# Patient Record
Sex: Female | Born: 1970
Health system: Southern US, Community
[De-identification: ages and names within clinical notes are randomized; demographics above are authoritative.]

## PROBLEM LIST (undated history)

## (undated) ENCOUNTER — Ambulatory Visit: Admission: EM | Payer: Managed Care, Other (non HMO) | Source: Home / Self Care

## (undated) DIAGNOSIS — D649 Anemia, unspecified: Secondary | ICD-10-CM

## (undated) DIAGNOSIS — J45909 Unspecified asthma, uncomplicated: Secondary | ICD-10-CM

## (undated) DIAGNOSIS — R87619 Unspecified abnormal cytological findings in specimens from cervix uteri: Secondary | ICD-10-CM

## (undated) DIAGNOSIS — K589 Irritable bowel syndrome without diarrhea: Secondary | ICD-10-CM

## (undated) DIAGNOSIS — N938 Other specified abnormal uterine and vaginal bleeding: Secondary | ICD-10-CM

## (undated) DIAGNOSIS — D219 Benign neoplasm of connective and other soft tissue, unspecified: Secondary | ICD-10-CM

## (undated) DIAGNOSIS — F319 Bipolar disorder, unspecified: Secondary | ICD-10-CM

## (undated) DIAGNOSIS — R51 Headache: Secondary | ICD-10-CM

## (undated) DIAGNOSIS — N6009 Solitary cyst of unspecified breast: Secondary | ICD-10-CM

## (undated) DIAGNOSIS — E079 Disorder of thyroid, unspecified: Secondary | ICD-10-CM

## (undated) DIAGNOSIS — IMO0002 Reserved for concepts with insufficient information to code with codable children: Secondary | ICD-10-CM

## (undated) DIAGNOSIS — E039 Hypothyroidism, unspecified: Secondary | ICD-10-CM

## (undated) DIAGNOSIS — C4491 Basal cell carcinoma of skin, unspecified: Secondary | ICD-10-CM

## (undated) HISTORY — DX: Unspecified abnormal cytological findings in specimens from cervix uteri: R87.619

## (undated) HISTORY — PX: DILATION AND CURETTAGE OF UTERUS: SHX78

## (undated) HISTORY — DX: Reserved for concepts with insufficient information to code with codable children: IMO0002

## (undated) HISTORY — DX: Solitary cyst of unspecified breast: N60.09

## (undated) HISTORY — PX: TUBAL LIGATION: SHX77

## (undated) HISTORY — PX: CERVICAL BIOPSY  W/ LOOP ELECTRODE EXCISION: SUR135

## (undated) HISTORY — DX: Basal cell carcinoma of skin, unspecified: C44.91

## (undated) HISTORY — DX: Benign neoplasm of connective and other soft tissue, unspecified: D21.9

## (undated) HISTORY — PX: COLONOSCOPY: SHX174

## (undated) HISTORY — PX: CHOLECYSTECTOMY: SHX55

## (undated) HISTORY — PX: COLON SURGERY: SHX602

## (undated) HISTORY — PX: UPPER GASTROINTESTINAL ENDOSCOPY: SHX188

## (undated) HISTORY — DX: Other specified abnormal uterine and vaginal bleeding: N93.8

---

## 2001-06-03 ENCOUNTER — Emergency Department (HOSPITAL_COMMUNITY): Admission: EM | Admit: 2001-06-03 | Discharge: 2001-06-03 | Payer: Self-pay | Admitting: Emergency Medicine

## 2001-06-25 ENCOUNTER — Emergency Department (HOSPITAL_COMMUNITY): Admission: EM | Admit: 2001-06-25 | Discharge: 2001-06-25 | Payer: Self-pay | Admitting: Emergency Medicine

## 2001-06-25 ENCOUNTER — Encounter: Payer: Self-pay | Admitting: Emergency Medicine

## 2001-11-08 ENCOUNTER — Emergency Department (HOSPITAL_COMMUNITY): Admission: EM | Admit: 2001-11-08 | Discharge: 2001-11-08 | Payer: Self-pay | Admitting: Emergency Medicine

## 2002-01-25 ENCOUNTER — Emergency Department (HOSPITAL_COMMUNITY): Admission: EM | Admit: 2002-01-25 | Discharge: 2002-01-25 | Payer: Self-pay | Admitting: Emergency Medicine

## 2002-04-28 ENCOUNTER — Emergency Department (HOSPITAL_COMMUNITY): Admission: EM | Admit: 2002-04-28 | Discharge: 2002-04-28 | Payer: Self-pay | Admitting: Emergency Medicine

## 2002-05-01 ENCOUNTER — Emergency Department (HOSPITAL_COMMUNITY): Admission: EM | Admit: 2002-05-01 | Discharge: 2002-05-01 | Payer: Self-pay | Admitting: Emergency Medicine

## 2002-09-12 ENCOUNTER — Encounter: Payer: Self-pay | Admitting: Emergency Medicine

## 2002-09-12 ENCOUNTER — Emergency Department (HOSPITAL_COMMUNITY): Admission: EM | Admit: 2002-09-12 | Discharge: 2002-09-12 | Payer: Self-pay | Admitting: Emergency Medicine

## 2002-09-25 ENCOUNTER — Encounter: Admission: RE | Admit: 2002-09-25 | Discharge: 2002-09-25 | Payer: Self-pay | Admitting: Internal Medicine

## 2002-12-31 ENCOUNTER — Encounter: Admission: RE | Admit: 2002-12-31 | Discharge: 2002-12-31 | Payer: Self-pay | Admitting: Internal Medicine

## 2003-02-09 ENCOUNTER — Encounter: Admission: RE | Admit: 2003-02-09 | Discharge: 2003-02-09 | Payer: Self-pay | Admitting: Internal Medicine

## 2003-02-17 ENCOUNTER — Encounter: Admission: RE | Admit: 2003-02-17 | Discharge: 2003-02-17 | Payer: Self-pay | Admitting: Internal Medicine

## 2003-03-25 ENCOUNTER — Encounter: Admission: RE | Admit: 2003-03-25 | Discharge: 2003-03-25 | Payer: Self-pay | Admitting: Internal Medicine

## 2003-03-30 ENCOUNTER — Encounter: Admission: RE | Admit: 2003-03-30 | Discharge: 2003-03-30 | Payer: Self-pay | Admitting: Obstetrics and Gynecology

## 2003-04-06 ENCOUNTER — Ambulatory Visit (HOSPITAL_COMMUNITY): Admission: RE | Admit: 2003-04-06 | Discharge: 2003-04-06 | Payer: Self-pay | Admitting: Obstetrics and Gynecology

## 2003-04-20 ENCOUNTER — Encounter: Admission: RE | Admit: 2003-04-20 | Discharge: 2003-04-20 | Payer: Self-pay | Admitting: Obstetrics and Gynecology

## 2003-05-21 ENCOUNTER — Encounter: Admission: RE | Admit: 2003-05-21 | Discharge: 2003-05-21 | Payer: Self-pay | Admitting: Family Medicine

## 2003-09-03 ENCOUNTER — Emergency Department (HOSPITAL_COMMUNITY): Admission: EM | Admit: 2003-09-03 | Discharge: 2003-09-03 | Payer: Self-pay | Admitting: Family Medicine

## 2003-09-30 ENCOUNTER — Inpatient Hospital Stay (HOSPITAL_COMMUNITY): Admission: AD | Admit: 2003-09-30 | Discharge: 2003-09-30 | Payer: Self-pay | Admitting: Obstetrics & Gynecology

## 2003-10-08 ENCOUNTER — Encounter: Admission: RE | Admit: 2003-10-08 | Discharge: 2003-10-08 | Payer: Self-pay | Admitting: Family Medicine

## 2003-12-02 ENCOUNTER — Encounter: Admission: RE | Admit: 2003-12-02 | Discharge: 2003-12-02 | Payer: Self-pay | Admitting: Family Medicine

## 2004-02-03 ENCOUNTER — Ambulatory Visit: Payer: Self-pay | Admitting: Family Medicine

## 2004-02-04 ENCOUNTER — Ambulatory Visit (HOSPITAL_COMMUNITY): Admission: RE | Admit: 2004-02-04 | Discharge: 2004-02-04 | Payer: Self-pay | Admitting: *Deleted

## 2004-04-06 ENCOUNTER — Ambulatory Visit: Payer: Self-pay | Admitting: Family Medicine

## 2004-04-09 ENCOUNTER — Emergency Department (HOSPITAL_COMMUNITY): Admission: EM | Admit: 2004-04-09 | Discharge: 2004-04-09 | Payer: Self-pay | Admitting: Emergency Medicine

## 2004-07-06 ENCOUNTER — Emergency Department (HOSPITAL_COMMUNITY): Admission: EM | Admit: 2004-07-06 | Discharge: 2004-07-06 | Payer: Self-pay | Admitting: Family Medicine

## 2004-07-11 ENCOUNTER — Ambulatory Visit: Payer: Self-pay | Admitting: Internal Medicine

## 2004-07-20 ENCOUNTER — Ambulatory Visit: Payer: Self-pay | Admitting: Internal Medicine

## 2004-10-19 ENCOUNTER — Other Ambulatory Visit: Admission: RE | Admit: 2004-10-19 | Discharge: 2004-10-19 | Payer: Self-pay | Admitting: Family Medicine

## 2004-10-19 ENCOUNTER — Ambulatory Visit: Payer: Self-pay | Admitting: Family Medicine

## 2004-11-09 ENCOUNTER — Ambulatory Visit: Payer: Self-pay | Admitting: Family Medicine

## 2004-12-04 ENCOUNTER — Ambulatory Visit: Payer: Self-pay | Admitting: Family Medicine

## 2004-12-04 ENCOUNTER — Ambulatory Visit (HOSPITAL_COMMUNITY): Admission: RE | Admit: 2004-12-04 | Discharge: 2004-12-04 | Payer: Self-pay | Admitting: Family Medicine

## 2004-12-04 ENCOUNTER — Encounter (INDEPENDENT_AMBULATORY_CARE_PROVIDER_SITE_OTHER): Payer: Self-pay | Admitting: *Deleted

## 2004-12-22 ENCOUNTER — Ambulatory Visit: Payer: Self-pay | Admitting: Family Medicine

## 2005-01-10 ENCOUNTER — Inpatient Hospital Stay (HOSPITAL_COMMUNITY): Admission: AD | Admit: 2005-01-10 | Discharge: 2005-01-10 | Payer: Self-pay | Admitting: Obstetrics and Gynecology

## 2005-01-23 ENCOUNTER — Ambulatory Visit: Payer: Self-pay | Admitting: Obstetrics and Gynecology

## 2005-03-22 ENCOUNTER — Emergency Department (HOSPITAL_COMMUNITY): Admission: EM | Admit: 2005-03-22 | Discharge: 2005-03-22 | Payer: Self-pay | Admitting: Family Medicine

## 2005-06-24 ENCOUNTER — Emergency Department (HOSPITAL_COMMUNITY): Admission: EM | Admit: 2005-06-24 | Discharge: 2005-06-24 | Payer: Self-pay | Admitting: Family Medicine

## 2005-08-04 ENCOUNTER — Emergency Department (HOSPITAL_COMMUNITY): Admission: EM | Admit: 2005-08-04 | Discharge: 2005-08-04 | Payer: Self-pay | Admitting: Family Medicine

## 2005-11-16 ENCOUNTER — Emergency Department (HOSPITAL_COMMUNITY): Admission: EM | Admit: 2005-11-16 | Discharge: 2005-11-16 | Payer: Self-pay | Admitting: Family Medicine

## 2005-11-28 ENCOUNTER — Ambulatory Visit: Payer: Self-pay | Admitting: Obstetrics and Gynecology

## 2005-11-28 ENCOUNTER — Other Ambulatory Visit: Admission: RE | Admit: 2005-11-28 | Discharge: 2005-11-28 | Payer: Self-pay | Admitting: Obstetrics & Gynecology

## 2005-11-28 ENCOUNTER — Encounter: Payer: Self-pay | Admitting: Obstetrics & Gynecology

## 2005-11-30 ENCOUNTER — Ambulatory Visit: Payer: Self-pay | Admitting: Family Medicine

## 2005-12-09 ENCOUNTER — Emergency Department (HOSPITAL_COMMUNITY): Admission: EM | Admit: 2005-12-09 | Discharge: 2005-12-09 | Payer: Self-pay | Admitting: Emergency Medicine

## 2006-01-22 ENCOUNTER — Ambulatory Visit (HOSPITAL_COMMUNITY): Admission: RE | Admit: 2006-01-22 | Discharge: 2006-01-22 | Payer: Self-pay | Admitting: Obstetrics and Gynecology

## 2006-01-22 ENCOUNTER — Ambulatory Visit: Payer: Self-pay | Admitting: Obstetrics and Gynecology

## 2006-02-07 ENCOUNTER — Ambulatory Visit: Payer: Self-pay | Admitting: Obstetrics and Gynecology

## 2006-03-19 ENCOUNTER — Inpatient Hospital Stay (HOSPITAL_COMMUNITY): Admission: AD | Admit: 2006-03-19 | Discharge: 2006-03-19 | Payer: Self-pay | Admitting: Obstetrics and Gynecology

## 2006-03-23 ENCOUNTER — Emergency Department (HOSPITAL_COMMUNITY): Admission: EM | Admit: 2006-03-23 | Discharge: 2006-03-23 | Payer: Self-pay | Admitting: Emergency Medicine

## 2006-04-11 ENCOUNTER — Emergency Department (HOSPITAL_COMMUNITY): Admission: EM | Admit: 2006-04-11 | Discharge: 2006-04-11 | Payer: Self-pay | Admitting: Family Medicine

## 2006-05-06 ENCOUNTER — Emergency Department (HOSPITAL_COMMUNITY): Admission: EM | Admit: 2006-05-06 | Discharge: 2006-05-06 | Payer: Self-pay | Admitting: Family Medicine

## 2006-05-09 ENCOUNTER — Emergency Department (HOSPITAL_COMMUNITY): Admission: EM | Admit: 2006-05-09 | Discharge: 2006-05-09 | Payer: Self-pay | Admitting: Family Medicine

## 2006-07-14 ENCOUNTER — Emergency Department (HOSPITAL_COMMUNITY): Admission: EM | Admit: 2006-07-14 | Discharge: 2006-07-14 | Payer: Self-pay | Admitting: Emergency Medicine

## 2006-07-30 ENCOUNTER — Ambulatory Visit: Payer: Self-pay | Admitting: Nurse Practitioner

## 2006-08-08 ENCOUNTER — Encounter (INDEPENDENT_AMBULATORY_CARE_PROVIDER_SITE_OTHER): Payer: Self-pay | Admitting: Nurse Practitioner

## 2006-08-08 ENCOUNTER — Ambulatory Visit: Payer: Self-pay | Admitting: Nurse Practitioner

## 2006-08-08 ENCOUNTER — Other Ambulatory Visit: Admission: RE | Admit: 2006-08-08 | Discharge: 2006-08-08 | Payer: Self-pay | Admitting: Family Medicine

## 2007-02-08 ENCOUNTER — Emergency Department (HOSPITAL_COMMUNITY): Admission: EM | Admit: 2007-02-08 | Discharge: 2007-02-08 | Payer: Self-pay | Admitting: Emergency Medicine

## 2007-05-12 ENCOUNTER — Emergency Department (HOSPITAL_COMMUNITY): Admission: EM | Admit: 2007-05-12 | Discharge: 2007-05-12 | Payer: Self-pay | Admitting: Family Medicine

## 2007-05-15 ENCOUNTER — Ambulatory Visit: Payer: Self-pay | Admitting: Family Medicine

## 2007-07-23 ENCOUNTER — Ambulatory Visit: Payer: Self-pay | Admitting: Family Medicine

## 2007-08-07 ENCOUNTER — Ambulatory Visit: Payer: Self-pay | Admitting: Internal Medicine

## 2007-09-01 ENCOUNTER — Ambulatory Visit: Payer: Self-pay | Admitting: Internal Medicine

## 2007-09-01 ENCOUNTER — Encounter (INDEPENDENT_AMBULATORY_CARE_PROVIDER_SITE_OTHER): Payer: Self-pay | Admitting: Nurse Practitioner

## 2007-09-01 LAB — CONVERTED CEMR LAB: TSH: 65.593 microintl units/mL — ABNORMAL HIGH (ref 0.350–5.50)

## 2007-10-08 ENCOUNTER — Ambulatory Visit: Payer: Self-pay | Admitting: Internal Medicine

## 2007-10-09 ENCOUNTER — Encounter (INDEPENDENT_AMBULATORY_CARE_PROVIDER_SITE_OTHER): Payer: Self-pay | Admitting: Nurse Practitioner

## 2007-10-09 LAB — CONVERTED CEMR LAB
Lithium Lvl: 0.54 meq/L — ABNORMAL LOW (ref 0.80–1.40)
TSH: 19.986 microintl units/mL — ABNORMAL HIGH (ref 0.350–5.50)

## 2007-11-12 ENCOUNTER — Ambulatory Visit: Payer: Self-pay | Admitting: Internal Medicine

## 2007-11-12 LAB — CONVERTED CEMR LAB: TSH: 1.684 microintl units/mL (ref 0.350–5.50)

## 2008-01-08 ENCOUNTER — Encounter: Payer: Self-pay | Admitting: Internal Medicine

## 2008-01-08 ENCOUNTER — Ambulatory Visit: Payer: Self-pay | Admitting: Internal Medicine

## 2008-01-08 LAB — CONVERTED CEMR LAB
BUN: 11 mg/dL (ref 6–23)
CO2: 21 meq/L (ref 19–32)
Calcium: 9 mg/dL (ref 8.4–10.5)
Chloride: 104 meq/L (ref 96–112)
Creatinine, Ser: 0.8 mg/dL (ref 0.40–1.20)
Glucose, Bld: 93 mg/dL (ref 70–99)
Lithium Lvl: 0.45 meq/L — ABNORMAL LOW (ref 0.80–1.40)
Potassium: 4 meq/L (ref 3.5–5.3)
Sodium: 138 meq/L (ref 135–145)
TSH: 0.371 microintl units/mL (ref 0.350–4.50)

## 2008-04-13 ENCOUNTER — Ambulatory Visit: Payer: Self-pay | Admitting: Internal Medicine

## 2008-05-11 ENCOUNTER — Ambulatory Visit (HOSPITAL_COMMUNITY): Admission: RE | Admit: 2008-05-11 | Discharge: 2008-05-11 | Payer: Self-pay | Admitting: Family Medicine

## 2008-06-01 ENCOUNTER — Encounter: Admission: RE | Admit: 2008-06-01 | Discharge: 2008-06-01 | Payer: Self-pay | Admitting: Family Medicine

## 2008-10-04 ENCOUNTER — Ambulatory Visit: Payer: Self-pay | Admitting: Family Medicine

## 2008-10-04 ENCOUNTER — Encounter: Payer: Self-pay | Admitting: Cardiovascular Disease

## 2008-10-04 ENCOUNTER — Encounter (INDEPENDENT_AMBULATORY_CARE_PROVIDER_SITE_OTHER): Payer: Self-pay | Admitting: Internal Medicine

## 2008-10-04 LAB — CONVERTED CEMR LAB
ALT: 16 units/L (ref 0–35)
AST: 14 units/L (ref 0–37)
Albumin: 4.4 g/dL (ref 3.5–5.2)
Alkaline Phosphatase: 68 units/L (ref 39–117)
BUN: 11 mg/dL (ref 6–23)
CO2: 24 meq/L (ref 19–32)
Calcium: 9.5 mg/dL (ref 8.4–10.5)
Chloride: 105 meq/L (ref 96–112)
Creatinine, Ser: 0.79 mg/dL (ref 0.40–1.20)
Glucose, Bld: 94 mg/dL (ref 70–99)
Lithium Lvl: 0.47 meq/L — ABNORMAL LOW (ref 0.80–1.40)
Potassium: 4.2 meq/L (ref 3.5–5.3)
Sodium: 138 meq/L (ref 135–145)
TSH: 1.765 microintl units/mL (ref 0.350–4.500)
Total Bilirubin: 0.3 mg/dL (ref 0.3–1.2)
Total Protein: 7.1 g/dL (ref 6.0–8.3)

## 2008-10-19 ENCOUNTER — Emergency Department (HOSPITAL_COMMUNITY): Admission: EM | Admit: 2008-10-19 | Discharge: 2008-10-19 | Payer: Self-pay | Admitting: Emergency Medicine

## 2008-11-05 ENCOUNTER — Ambulatory Visit: Payer: Self-pay | Admitting: Cardiovascular Disease

## 2008-11-05 DIAGNOSIS — R079 Chest pain, unspecified: Secondary | ICD-10-CM

## 2008-11-05 DIAGNOSIS — R9431 Abnormal electrocardiogram [ECG] [EKG]: Secondary | ICD-10-CM | POA: Insufficient documentation

## 2008-11-05 HISTORY — DX: Chest pain, unspecified: R07.9

## 2008-11-08 ENCOUNTER — Telehealth (INDEPENDENT_AMBULATORY_CARE_PROVIDER_SITE_OTHER): Payer: Self-pay | Admitting: *Deleted

## 2008-11-24 ENCOUNTER — Encounter: Payer: Self-pay | Admitting: Cardiovascular Disease

## 2008-11-24 ENCOUNTER — Ambulatory Visit: Payer: Self-pay

## 2008-11-26 ENCOUNTER — Telehealth (INDEPENDENT_AMBULATORY_CARE_PROVIDER_SITE_OTHER): Payer: Self-pay | Admitting: *Deleted

## 2008-11-29 ENCOUNTER — Encounter (INDEPENDENT_AMBULATORY_CARE_PROVIDER_SITE_OTHER): Payer: Self-pay | Admitting: Internal Medicine

## 2008-11-29 ENCOUNTER — Ambulatory Visit: Payer: Self-pay | Admitting: Internal Medicine

## 2008-11-29 LAB — CONVERTED CEMR LAB
Chlamydia, DNA Probe: NEGATIVE
GC Probe Amp, Genital: NEGATIVE

## 2009-01-15 ENCOUNTER — Emergency Department (HOSPITAL_COMMUNITY): Admission: EM | Admit: 2009-01-15 | Discharge: 2009-01-15 | Payer: Self-pay | Admitting: Emergency Medicine

## 2009-04-11 ENCOUNTER — Ambulatory Visit: Payer: Self-pay | Admitting: Family Medicine

## 2009-07-06 ENCOUNTER — Ambulatory Visit: Payer: Self-pay | Admitting: Internal Medicine

## 2009-08-29 ENCOUNTER — Ambulatory Visit: Payer: Self-pay | Admitting: Internal Medicine

## 2009-08-29 ENCOUNTER — Encounter (INDEPENDENT_AMBULATORY_CARE_PROVIDER_SITE_OTHER): Payer: Self-pay | Admitting: Family Medicine

## 2009-08-29 LAB — CONVERTED CEMR LAB
BUN: 17 mg/dL (ref 6–23)
CO2: 19 meq/L (ref 19–32)
Calcium: 9.7 mg/dL (ref 8.4–10.5)
Chloride: 107 meq/L (ref 96–112)
Creatinine, Ser: 0.74 mg/dL (ref 0.40–1.20)
Glucose, Bld: 90 mg/dL (ref 70–99)
Lithium Lvl: 0.42 meq/L — ABNORMAL LOW (ref 0.80–1.40)
Potassium: 3.8 meq/L (ref 3.5–5.3)
Sodium: 141 meq/L (ref 135–145)
TSH: 0.32 microintl units/mL — ABNORMAL LOW (ref 0.350–4.500)

## 2009-10-30 ENCOUNTER — Emergency Department (HOSPITAL_COMMUNITY): Admission: EM | Admit: 2009-10-30 | Discharge: 2009-10-30 | Payer: Self-pay | Admitting: Emergency Medicine

## 2009-11-07 ENCOUNTER — Ambulatory Visit: Payer: Self-pay | Admitting: Internal Medicine

## 2009-12-28 ENCOUNTER — Ambulatory Visit: Payer: Self-pay | Admitting: Internal Medicine

## 2009-12-28 ENCOUNTER — Encounter (INDEPENDENT_AMBULATORY_CARE_PROVIDER_SITE_OTHER): Payer: Self-pay | Admitting: Family Medicine

## 2009-12-28 LAB — CONVERTED CEMR LAB
Basophils Absolute: 0 10*3/uL (ref 0.0–0.1)
Basophils Relative: 0 % (ref 0–1)
Cholesterol: 186 mg/dL (ref 0–200)
Eosinophils Absolute: 0.2 10*3/uL (ref 0.0–0.7)
Eosinophils Relative: 3 % (ref 0–5)
HCT: 45.1 % (ref 36.0–46.0)
HDL: 36 mg/dL — ABNORMAL LOW (ref >39)
Hemoglobin: 13.7 g/dL (ref 12.0–15.0)
Lithium Lvl: 0.57 meq/L — ABNORMAL LOW (ref 0.80–1.40)
Lymphocytes Relative: 24 % (ref 12–46)
Lymphs Abs: 1.5 10*3/uL (ref 0.7–4.0)
MCHC: 30.4 g/dL (ref 30.0–36.0)
MCV: 98.9 fL (ref 78.0–100.0)
Monocytes Absolute: 0.3 10*3/uL (ref 0.1–1.0)
Monocytes Relative: 6 % (ref 3–12)
Neutro Abs: 4.1 10*3/uL (ref 1.7–7.7)
Neutrophils Relative %: 68 % (ref 43–77)
Platelets: 210 10*3/uL (ref 150–400)
RBC: 4.56 M/uL (ref 3.87–5.11)
RDW: 14.2 % (ref 11.5–15.5)
TSH: 0.206 microintl units/mL — ABNORMAL LOW (ref 0.350–4.500)
Total CHOL/HDL Ratio: 5.2
Triglycerides: 470 mg/dL — ABNORMAL HIGH (ref <150)
WBC: 6.1 10*3/uL (ref 4.0–10.5)

## 2010-01-09 ENCOUNTER — Ambulatory Visit: Payer: Self-pay | Admitting: Internal Medicine

## 2010-01-09 ENCOUNTER — Encounter (INDEPENDENT_AMBULATORY_CARE_PROVIDER_SITE_OTHER): Payer: Self-pay | Admitting: Family Medicine

## 2010-01-09 LAB — CONVERTED CEMR LAB
BUN: 10 mg/dL (ref 6–23)
CO2: 22 meq/L (ref 19–32)
Calcium: 9.5 mg/dL (ref 8.4–10.5)
Chloride: 108 meq/L (ref 96–112)
Creatinine, Ser: 0.87 mg/dL (ref 0.40–1.20)
Glucose, Bld: 95 mg/dL (ref 70–99)
Potassium: 3.9 meq/L (ref 3.5–5.3)
Sodium: 139 meq/L (ref 135–145)

## 2010-03-28 ENCOUNTER — Encounter (INDEPENDENT_AMBULATORY_CARE_PROVIDER_SITE_OTHER): Payer: Self-pay | Admitting: *Deleted

## 2010-03-28 ENCOUNTER — Ambulatory Visit: Payer: Self-pay | Admitting: Internal Medicine

## 2010-03-28 LAB — CONVERTED CEMR LAB
BUN: 9 mg/dL (ref 6–23)
CO2: 22 meq/L (ref 19–32)
Calcium: 9.4 mg/dL (ref 8.4–10.5)
Chloride: 105 meq/L (ref 96–112)
Creatinine, Ser: 0.66 mg/dL (ref 0.40–1.20)
Glucose, Bld: 91 mg/dL (ref 70–99)
Potassium: 4.7 meq/L (ref 3.5–5.3)
Sodium: 138 meq/L (ref 135–145)
TSH: 2.351 microintl units/mL (ref 0.350–4.500)

## 2010-06-24 ENCOUNTER — Encounter: Payer: Self-pay | Admitting: *Deleted

## 2010-06-25 ENCOUNTER — Encounter: Payer: Self-pay | Admitting: Family Medicine

## 2010-07-06 NOTE — Progress Notes (Signed)
  Recieved a request for Records 11/08/08 form Disability Determination Services will forward over to [facility] to process               [facility]  November 08, 2008 1:36 PM

## 2010-07-06 NOTE — Progress Notes (Signed)
  Recieved a request for Records from [employer]ability [employer] Services 11/26/08 will forward to [facility]  November 26, 2008 10:18 AM
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
## 2010-07-07 NOTE — Letter (Signed)
Summary: Referral from [facility] with office visit and EKG   Referral from [facility] with office visit and EKG   Imported By: Maryln Gottron 11/23/2008 11:29:05  _____________________________________________________________________  External Attachment:    Type:   Image     Comment:   External Document

## 2010-07-17 ENCOUNTER — Encounter (INDEPENDENT_AMBULATORY_CARE_PROVIDER_SITE_OTHER): Payer: Self-pay | Admitting: *Deleted

## 2010-07-17 LAB — CONVERTED CEMR LAB
ALT: 13 units/L (ref 0–35)
AST: 14 units/L (ref 0–37)
Albumin: 4.5 g/dL (ref 3.5–5.2)
Alkaline Phosphatase: 57 units/L (ref 39–117)
BUN: 13 mg/dL (ref 6–23)
CO2: 25 meq/L (ref 19–32)
Calcium: 9.4 mg/dL (ref 8.4–10.5)
Chloride: 104 meq/L (ref 96–112)
Creatinine, Ser: 0.76 mg/dL (ref 0.40–1.20)
Glucose, Bld: 94 mg/dL (ref 70–99)
HCT: 40.4 % (ref 36.0–46.0)
Hemoglobin: 12.7 g/dL (ref 12.0–15.0)
Lithium Lvl: 0.58 meq/L — ABNORMAL LOW (ref 0.80–1.40)
MCHC: 31.4 g/dL (ref 30.0–36.0)
MCV: 92.4 fL (ref 78.0–100.0)
Platelets: 248 10*3/uL (ref 150–400)
Potassium: 4.2 meq/L (ref 3.5–5.3)
RBC: 4.37 M/uL (ref 3.87–5.11)
RDW: 13.6 % (ref 11.5–15.5)
Sodium: 138 meq/L (ref 135–145)
TSH: 2.012 microintl units/mL (ref 0.350–4.500)
Total Bilirubin: 0.5 mg/dL (ref 0.3–1.2)
Total Protein: 7.2 g/dL (ref 6.0–8.3)
WBC: 5.9 10*3/uL (ref 4.0–10.5)

## 2010-09-13 ENCOUNTER — Other Ambulatory Visit: Payer: Self-pay | Admitting: Family Medicine

## 2010-09-13 ENCOUNTER — Other Ambulatory Visit (HOSPITAL_COMMUNITY)
Admission: RE | Admit: 2010-09-13 | Discharge: 2010-09-13 | Disposition: A | Discharge status: Home / Self Care | Payer: Self-pay | Source: Ambulatory Visit | Attending: Family Medicine | Admitting: Family Medicine

## 2010-09-13 DIAGNOSIS — Z01419 Encounter for gynecological examination (general) (routine) without abnormal findings: Secondary | ICD-10-CM | POA: Insufficient documentation

## 2010-10-20 NOTE — Op Note (Signed)
[name], Kristen Moore          ACCOUNT NO.:  [id number]   MEDICAL RECORD NO.:  [id number]          PATIENT TYPE:  AMB   LOCATION:  SDC                           [facility]   PHYSICIAN:  Phil D. Okey Dupre, M.D.     DATE OF BIRTH:  1971/04/21   DATE OF PROCEDURE:  01/22/2006  DATE OF DISCHARGE:                                 OPERATIVE REPORT   PROCEDURE:  Laparoscopic sterilization.   PRE-AND-POSTOPERATIVE DIAGNOSIS:  Voluntary sterilization.   SURGEON:  Javier Glazier. Okey Dupre, M.D.   SPECIMENS TO PATHOLOGY:  None.   ANESTHESIA:  General.   ESTIMATED BLOOD LOSS:  Minimal.   POSTOPERATIVE CONDITION:  Satisfactory.   PROCEDURE WENT AS FOLLOWS:  Under satisfactory general anesthesia with the  patient in the dorsal semilithotomy position; perineum, vagina, and abdomen  prepped and draped in the usual sterile manner.  Bimanual pelvic examination  under anesthesia revealed a uterus of normal size, shape, and consistency  with a small fundal fibroid measuring about 3 cm in diameter.  Adnexa could  not be well palpated.  Weighted speculum was placed in the posterior  fourchette of the vagina; and anterior lip of the cervix grasped with a  single-tooth tenaculum; and a clamp placed into the cervix and attached to  the anterior lip with a probe inside for mobilization of the uterus; and the  speculum was removed.  A 1-cm transverse incision made just below the  umbilicus.  Veress needle inserted into the peritoneal cavity, and  approximately 3 liters carbon dioxide slowly insufflated into the peritoneal  cavity.   Equal tympany occurred over the entire abdominal wall.  The Veress needle  was removed.  The laparoscopic trocar inserted into the peritoneal cavity.  This trocar removed from the sleeve.  The laparoscope inserted.  Both tubes  were seen clearly and the small fibroid at the fundus was noted.  The rest  of the pelvis seemed completely normal and each tube was grasped in the  midportion and coagulated until blanching occurred.  This was done after 1%  Marcaine was sprayed onto the tubes.  The laparoscope was removed and as  much CO2 as possible was expressed from the sleeve, and the sleeve removed.   The incision closed with a 3-0 Vicryl suture that was a figure-of-eight  __________ the fascia; and Dermabond was used for skin edge approximation.  Tenaculum was removed from vagina.  The patient transferred to recovery room  in satisfactory condition having tolerated the procedure well.          ______________________________  Javier Glazier. Okey Dupre, M.D.    PDR/MEDQ  D:  01/22/2006  T:  01/22/2006  Job:  [id number]

## 2010-10-20 NOTE — Op Note (Signed)
NAME:  Kristen Moore, Kristen Moore          ACCOUNT NO.:  [id number]   MEDICAL RECORD NO.:  [id number]          PATIENT TYPE:  AMB   LOCATION:  [facility]C                           [facility]   PHYSICIAN:  Tanya S. Shawnie Pons, M.D.   DATE OF BIRTH:  29-Aug-1970   DATE OF PROCEDURE:  12/04/2004  DATE OF DISCHARGE:                                 OPERATIVE REPORT   PREOPERATIVE DIAGNOSIS:  Abnormal bleeding, endometrial polyp.   POSTOPERATIVE DIAGNOSIS:  Abnormal bleeding, endometrial polyp.   PROCEDURE:  D&C.   SURGEON:  Shelbie Proctor. Shawnie Pons, M.D.   ANESTHESIA:  General and local with Raul Del, M.D.   SPECIMENS:  Endometrial curettings.   ESTIMATED BLOOD LOSS:  50 mL.   COMPLICATIONS:  None.   REASON FOR PROCEDURE:  Briefly, the patient is a 40 year old gravida 3, para  1-0-2-1, with abnormal bleeding.  Recent MB revealed a possible polyp.  The  patient has been having very heavy periods for the last 3 to 4 months.   DESCRIPTION OF PROCEDURE:  The patient was taken to the operating room where  she was placed under general anesthesia.  She was placed in dorsal lithotomy  in [place] stirrups.  She was prepped and draped in the usual sterile fashion.  Red rubber catheter was used to drain her bladder.  Speculum was placed  inside the vagina.  The cervix was visualized and was injected with 10 mL of  1% lidocaine with epinephrine.  The uterus was then sounded to approximately  8.5 cm.  The cervix was sequentially dilated.  A medium size curet was then  used to curet the endometrium until a gritty surface was found on all four  sides.  A good amount of endometrial tissue was obtained and this was sent  to pathology.  All instruments were then removed from the vagina.  All  instrument and lap counts were correct x2.  The patient was awakened and  taken to the recovery room in stable condition.       TSP/MEDQ  D:  12/04/2004  T:  12/04/2004  Job:  [id number]

## 2010-10-20 NOTE — Group Therapy Note (Signed)
NAME:  Kristen Moore, Kristen Moore NO.:  [id number]   MEDICAL RECORD NO.:  [id number]          PATIENT TYPE:  WOC   LOCATION:  [facility]                   [facility]AC[facility]:  [facility]   PHYSICIAN:  Argentina Donovan, MD        DATE OF BIRTH:  08-18-1970   DATE OF SERVICE:  01/23/2005                                    CLINIC NOTE   REASON FOR VISIT:  The patient is a 40 year old gravida 3 para 1-1-1-1 who  comes in with dysfunctional uterine bleeding. She had a D&C in early July by  Dr. Shawnie Pons which showed endometrial polyp. Since then she has had some heavy  bleeding. We discussed hormonal control of this as well as endometrial  ablation and she wants no more children. She is not anxious to take Depo-  Provera because of the weight gain. She cannot afford the birth control  pills. I have counseled her about going and trying to qualify for the health  department family planning clinic where the pills are less expensive. She  seems in agreement with this. We discussed the pill. We are going to put her  on Ortho Tri-Cyclen Lo starting today. I have given her three sample  packages. She meanwhile is going to try and enroll in the health department.  If the pills do not work, we will try to Depo-Provera or endometrial  ablation.   DIAGNOSIS:  Chronic dysfunctional uterine bleeding.           ______________________________  Argentina Donovan, MD     PR/MEDQ  D:  01/23/2005  T:  01/23/2005  Job:  [id number]

## 2010-10-20 NOTE — Group Therapy Note (Signed)
NAME:  Kristen Moore, Kristen Moore NO.:  [id number]   MEDICAL RECORD NO.:  [id number]          PATIENT TYPE:  WOC   [facility]ATION:  [facility]                   [facility]   PHYSICIAN:  Argentina Donovan, MD        DATE OF BIRTH:  May 01, 1971   DATE OF SERVICE:  11/28/2005                                    CLINIC NOTE   CHIEF COMPLAINT:  1.  Pap smear, yearly checkup.  2.  Dysuria.  3.  See desired contraception.   SUBJECTIVE:  1.  Kristen Moore is a 40 year old white female patient with prior      medical history of a small subserosal fibroid in the posterior      ___________ with irregular periods and heavy periods who has been trying      to use hormonal therapies for her dysfunctional uterine bleeding without      success.  She had a D&C in July 2006 that showed endometrial polyps.  At      that point, she was started on birth control pills and the plan was to      try Depo-Provera and endometrial ablation if this was unsuccessful.  The      patient is returning today, and she did not mention any heavy periods,      but she would like a definite contraception method, and she mentioned      she would like uterine endometrial ablation versus bilateral tubal      ligation.  2.  Dysuria.  The patient complained for the last couple of days of burning      micturition and frequent urine.  She has been afebrile and has no other      complaints.  Denies pain with intercourse or abdominal pain.  3.  The patient desires permanent sterilization.   PHYSICAL EXAMINATION:  VITAL SIGNS:  Blood pressure 108/72, pulse 77,  afebrile.  GENERAL APPEARANCE:  No acute distress.  EXTERNAL GENITALIA:  No lesions.  Speculum examination showed a multiparous  cervix with __________ densities and mucous discharge.  Wet prep,  GC/Chlamydia and Pap smear samples taken.  Bimanual examination:  There is  no cervical motion tenderness.  Mild discomfort to palpation in suprapubic  area.  No  uterine or adnexal masses palpable.   ASSESSMENT/PLAN:  1.  Decide contraception.  We discussed different contraceptions including      endometrial ablation and bilateral tubal ligation.  The patient decided      for bilateral tubal ligation.  She is a G3 with two vaginal deliveries      at one _________ and one miscarriage.  Bilateral tubal ligation      information and Medicaid consult provided to patient.  2.  Irregular menstrual periods.  Currently __________ bleeding.  We will      check CBC__________.  Dr. Okey Dupre will contact the patient tomorrow in      regard to endometrial ablation versus bilateral tubal ligation plus      birth control pills or hormonal contraception.  3.  Complete physical, Pap smear with __________ Chlamydia sent.  The  patient decided to be checked for HIV and RPR which we will do in      conjunction with fasting lipid profile, CBC and TSH on that visit.           ______________________________  Argentina Donovan, MD     PR/MEDQ  D:  11/28/2005  T:  11/28/2005  Job:  [id number]

## 2010-10-20 NOTE — Group Therapy Note (Signed)
NAME:  Kristen Moore, Kristen Moore                    ACCOUNT NO.:  [id number]   MEDICAL RECORD NO.:  [id number]                   PATIENT TYPE:  OUT   LOCATION:  [facility]                           [facility]ACILITY:  [facility]   PHYSICIAN:  Tinnie Gens, MD                     DATE OF BIRTH:  April 06, 1971   DATE OF SERVICE:  10/08/2003                                    CLINIC NOTE   CHIEF COMPLAINT:  Abdominal pain.   HISTORY OF PRESENT ILLNESS:  The patient is a 40 year old gravida 3, para 1-  0-2-1 who has been diagnosed with a less than 2 cm fibroid.  She reported  that she was having some really abnormal bleeding.  Was on __________  multiphasic progesterone pill and was switched to Ovcon for a monophasic  pill which had a good response on her bleeding.  However, she has now  developed some left-sided abdominal pain that seems to be diffuse in the  upper and lower quadrants and comes on the week before her period and stays  until the week after.  She has been taking Tylenol but this has not given  her a lot of relief.  She was seen in the MAU approximately one week ago.  Had a pelvic ultrasound that revealed this fibroid that was in the left  fundal region that was less than 2 cm in diameter.  She had a normal  hemoglobin and hematocrit at that time.  Wet prep, GC and Chlamydia testing.  and UA were all negative.   PHYSICAL EXAMINATION:  VITAL SIGNS:  On exam today, her vitals are as noted  in the chart.  GENERAL:  She is a well-developed, well-nourished white female in no acute  distress.  GENITOURINARY:  She has normal external female genitalia.  PELVIC:  The uterus is very small, anteverted.  The fibroid could not be  significantly appreciated.  She had some diffuse tenderness in her pelvis  but there were no masses in the adnexa.   IMPRESSION:  1. Small fibroid in the left fundal region of the uterus unassociated with     the endometrium.  2. Abnormal uterine bleeding of questionable  cause.   PLAN:  We will restart her Ovcon ________.  Trial of Naprosyn a week before  to a week after her periods was due.  She will follow up in 2 months to see  if this is helping and explore other treatment options if it is not.                                               Tinnie Gens, MD    TP/MEDQ  D:  10/08/2003  T:  10/08/2003  Job:  [id number]

## 2010-10-20 NOTE — Group Therapy Note (Signed)
NAME:  Kristen Moore, Kristen Moore                    ACCOUNT NO.:  [id number]   MEDICAL RECORD NO.:  [id number]                   PATIENT TYPE:  WOC   [facility]ATION:  [facility]                           [facility]   PHYSICIAN:  Tinnie Gens, MD                     DATE OF BIRTH:  Aug 06, 1970   DATE OF SERVICE:  02/03/2004                                    CLINIC NOTE   CHIEF COMPLAINT:  Follow-up.   HISTORY OF PRESENT ILLNESS:  The patient is a 40 year old lady with chronic  pelvic pain and a fibroid uterus who has been followed on Yasmin for the  last 2 months.  She reports that she continues to have left-sided pelvic  pain.  She feels like her tumor is growing in size.  She also feels that  her bleeding has been inadequately controlled.  She has been using over-the-  counter ibuprofen.  She reports today that she thinks she is ready for  hysterectomy.   A lengthy discussion was had with this patient regarding treatment and  options of fibroid and the potential for not having a cure related to her  pain, even with hysterectomy.  We have also talked at length about removing  the ovaries or leaving them in, potential for worsening scar tissue after  surgery.   PHYSICAL EXAMINATION:  VITAL SIGNS:  As noted in the chart.  Blood pressure  is 118/77, weight is 152.  GENERAL:  She is a well-developed, well-nourished black female in no acute  distress.  ABDOMEN:  Soft, nontender, nondistended.  SKIN:  The patient has multiple irregular-bordered dark moles on her arms  and legs.   IMPRESSION:  1.  Chronic pelvic pain.  2.  Fibroid uterus.  3.  Multiple abnormal-appearing moles.   PLAN:  1.  After a lengthy discussion was had with this patient, she agreed to a      repeat of her pelvic ultrasound to make sure nothing significant growing      in size.  She will start on Ovcon now since her Yasmin is out to see if      this helps control her bleeding or pain.  2.  The patient will follow  up for posting for hysterectomy if she so      desires in about 2 months.  3.  The patient is requested to go to dermatology for mole evaluation and      possible removal.                                               Tinnie Gens, MD    TP/MEDQ  D:  02/03/2004  T:  02/05/2004  Job:  [id number]

## 2010-10-20 NOTE — Group Therapy Note (Signed)
   NAME:  TANISHA, LUTES                    ACCOUNT NO.:  [id number]   MEDICAL RECORD NO.:  [id number]                   PATIENT TYPE:  OUT   LOCATION:  [facility]                           [facility]   PHYSICIAN:  Argentina Donovan, MD                     DATE OF BIRTH:  1971-01-26   DATE OF SERVICE:  03/30/2003                                    CLINIC NOTE   HISTORY OF PRESENT ILLNESS:  The patient is a 40 year old gravida 3, para 1-  1-1-1 with a history of one preterm birth and one voluntary interruption of  pregnancy.  Her only child is 78 years old.  The patient comes in  complaining of chronic pelvic pain most marked on the days before her  period.  Has a history of recurrent functional ovarian cysts.  She also has  recurrent bacterial vaginosis and we have discussed treatments for both and  we will cycle her on oral contraceptives and give her an extended Flagyl  prescription.   PHYSICAL EXAMINATION:  GENITALIA:  External genitalia was normal.  BUS  within normal limits.  The introitus was marital.  Vagina is clean, well  rugated with a positive whiff test.  Cervix was clean and parous.  A Pap  smear and wet prep were taken.  Uterus was anterior, normal size, shape,  consistency, easily mobile with a normal cul-de-sac, free, and normal  adnexa, although the left adnexa did seem somewhat tender.   DIAGNOSES:  1. Chronic pelvic pain.  2. Recurrent bacterial vaginosis.  3. Dysfunctional uterine bleeding.                                               Argentina Donovan, MD    PR/MEDQ  D:  03/30/2003  T:  03/30/2003  Job:  [id number]

## 2010-10-20 NOTE — Group Therapy Note (Signed)
NAME:  Kristen Moore, SAMANO NO.:  [id number]   MEDICAL RECORD NO.:  [id number]          PATIENT TYPE:  WOC   [facility]ATION[facility]                   [facility]   PHYSICIAN:  Tinnie Gens, MD        DATE OF BIRTH:  [birth year]   DATE OF SERVICE:  04/06/2004                                    CLINIC NOTE   CHIEF COMPLAINT:  Follow-up.   HISTORY OF PRESENT ILLNESS:  The patient is a 40 year old patient who was  seen by me previously in September.  At that time, she was complaining of  pelvic pain and abnormal bleeding and she was concerned about a growing  fibroid.  Since that time we have had a follow-up ultrasound that shows a  stable fibroid at 1.9 cm size.  The patient continues on her birth control  though if she runs out of samples she does not have money to refill her  prescription.  The patient also had several moles and areas of concern for  possible basal cell versus squamous cell carcinoma versus fibroma that we  referred her to dermatology for.  However, she states she does not have any  money to pay for this visit.   PHYSICAL EXAMINATION TODAY:  VITAL SIGNS:  As noted in the chart.  Blood  pressure is 110/66, weight is 158.7.  GENERAL:  She is a well-developed, well-nourished white female in no acute  distress.  ABDOMEN:  Soft, nontender, nondistended.  She does have the moles as  discussed previously.   IMPRESSION:  1.  Chronic pelvic pain.  2.  Fibroid uterus stable in size.  3.  Skin abnormalities as noted above.   PLAN:  1.  Will follow her yearly for annual exam, Pap smears, and annual      sonography to check the size of her fibroid.  2.  Have referred her to outpatient clinic to see about possible biopsy of      several of her lesions.      TP/MEDQ  D:  04/06/2004  T:  04/06/2004  Job:  [id number]

## 2010-10-20 NOTE — Group Therapy Note (Signed)
NAME:  Kristen Moore, Kristen Moore                    ACCOUNT NO.:  [id number]   MEDICAL RECORD NO.:  [id number]                   PATIENT TYPE:  OUT   LOCATION[facility]                           [facility]   PHYSICIAN:  Tinnie Gens, MD                     DATE OF BIRTH:  November 12, 1970   DATE OF SERVICE:  12/02/2003                                    CLINIC NOTE   CHIEF COMPLAINT:  Two month follow up.   HISTORY OF PRESENT ILLNESS:  This is a 40 year old gravida 3, para 1-0-2-1  who presents today for follow up on abnormal uterine bleeding.  On a  previous visit, she was given a prescription for Ovcon 0.34/325 as well as  naproxen to try.  She did not get these prescriptions filled due to the fact  that she lost her Medicaid and does not have extra money to spend on  medicines.  Her bleeding is essentially unchanged.  She continues to have  heavy bleeding with blood clots.   Otherwise she has worsening symptoms of her irritable bowel syndrome with  stomach pains that eases off after bowel movements.  She has increased  amount of constipation and diarrhea.  During these episodes, she also says  that she feels feverish.   PHYSICAL EXAMINATION:  VITAL SIGNS:  Within normal limits with a pulse of  83, blood pressure 120/81.  GENERAL:  She is a well-appearing fairly anxious white female in no acute  distress.   IMPRESSION:  Abnormal uterine bleeding unable to complete trial of oral  contraception due to financial reasons.   PLAN:  Two boxes of Yasmin samples were provided to the patient to initiate  around her next period.  She was encouraged to fill the prescription for the  naproxen to use for pain and if she is unable to afford this, use of over-  the-counter Naprosyn was discussed.  She is to follow up in 2 months for  reevaluation.     Douglass Rivers, MD                        Tinnie Gens, MD    CH/MEDQ  D:  12/02/2003  T:  12/02/2003  Job:  [id number]

## 2010-10-20 NOTE — Group Therapy Note (Signed)
NAME:  MARCEDES, TECH NO.:  [id number]   MEDICAL RECORD NO.:  [id number]          PATIENT TYPE:  WOC   LOCATION:  [facility]                   FACILITY:  [facility]   PHYSICIAN:  Argentina Donovan, MD        DATE OF BIRTH:  January 05, 1971   DATE OF SERVICE:  11/30/2005                                    CLINIC NOTE   CHIEF COMPLAINT:  1.  Yearly checkup, Pap smear.  2.  Dysuria.  3.  The patient desires contraception.   SUBJECTIVE:  1.  Kristen Moore is a 40 year old white female patient with prior      gynecological history of dysfunctional uterine bleeding, last seen in      the clinic in August 2006.  Had a D&C in July with removal of      endometrial polyps.  Also had small subserosal fibroid in the posterior      uterine wall.  The patient stated after the D&C was performed her      periods have been more regular, still having heavy bleeding once in a      while, but at this point she is more worried about getting pregnant and      she would like definitive method of sterilization.  She tried birth      control pills before and the plan was to try Depo-Provera or use      endometrial ablation for dysfunctional uterine bleeding.  These will      help also with the contraception.  Today we are going to perform her      yearly checkup, Pap smear.  She would also like to be tested for      gonorrhea, chlamydia, HIV, and RPR.  Also would like to have all the      blood work, cholesterol, and hemoglobin checked.  2.  Dysuria.  The patient complaining for the last couple of days of burning      sensation and frequency with urination.  She has been afebrile.  Denies      abdominal pain or pain with intercourse.  3.  Desires permanent sterilization, probable bilateral tubal ligation.   PHYSICAL EXAMINATION:  VITAL SIGNS:  Blood pressure 108/72, pulse 77, the  patient afebrile.  GENERAL:  The patient in no acute distress, well-nourished.  ABDOMEN:  Positive bowel  sounds, nontender, nondistended.  No masses  palpable.  PELVIC:  External genitalia:  No lesions.  Speculum examination shows a  multiparous cervix with two nabothian cysts and minimal discharge.  Wet  prep, gonorrhea, chlamydia, and Pap smear samples taken.  Bimanual  examination:  There is no cervical  motion tenderness.  Mild discomfort to  palpation in suprapubic area.  No uterine or adnexal masses palpable.   ASSESSMENT AND PLAN:  1.  Complete physical, Pap smear, gonorrhea, chlamydia sent.  The patient      will come back this week for blood work for HIV, RPR, fasting lipid      profile, CBC, and TSH.  2.  Desires contraception.  After discussing several different options the      patient opts  for bilateral tubal ligation.  Medicaid form provided to      the patient.  3.  Irregular menstrual periods consistent with dysfunctional uterine      bleeding, improved after D&C performed.  Will prefer to have bilateral      tubal ligation and if necessary in the future, endometrial thermal      ablation.  We      will check CBC for anemia screening.  4.  Dysuria.  Urinalysis positive.  The patient will start Septra DS one      tablet p.o. b.i.d. x3 days.     ______________________________  Adrian Blackwater, M.D.    ______________________________  Argentina Donovan, MD    IM/MEDQ  D:  11/30/2005  T:  11/30/2005  Job:  [id number]

## 2010-10-20 NOTE — Group Therapy Note (Signed)
NAME:  [name], Moore NO.:  [id number]   MEDICAL RECORD NO.:  [id number]          PATIENT TYPE:  WOC   LOCATION:  [facility]                   FACILITY:  [facility]   PHYSICIAN:  Tinnie Gens, MD        DATE OF BIRTH:  07-12-1970   DATE OF SERVICE:  10/19/2004                                    CLINIC NOTE   CHIEF COMPLAINT:  Heavy bleeding.   HISTORY OF PRESENT ILLNESS:  The patient is a 40 year old gravida 3, para 1-  0-2-1 who has previously been seen in this office for abnormal uterine  bleeding and for chronic pelvic pain.  The patient had one very small  fibroid previously.  She has been on birth control.  She is a smoker.  She  decided on her own to stop her birth control.  She states that she is having  very heavy bleeding and some pelvic pain.  She has got dizziness, headaches,  weakness.  The patient wants to know exactly what is going on with this.  Her last Pap smear was in October of 2004.  Last period was 10/06/2004.  The  patient states she has been off of her birth control for some time.   PHYSICAL EXAMINATION:  VITAL SIGNS:  Her vital signs are as noted on the  chart.  ABDOMEN:  Soft, nontender, nondistended.  GENERAL:  She is a well-developed, well-nourished white female in no acute  distress.  GU:  She has normal external female genitalia with the exception of a small  nodule on the left labia.  The vagina is pink and rugated.  The cervix is  parous and without lesion.   PROCEDURE:  A single-tooth tenaculum was used to grasp the anterior lip of  the cervix.  A Pipelle was used to sound the uterus.  It was sounded to 7.0  cm, and endometrial biopsy was taken without difficulty.   IMPRESSION:  1.  Abnormal uterine bleeding with a known history of fibroid.  2.  Mood swings, dizziness, weakness, question etiology.  3.  Annual exam.   PLAN:  1.  Pap smear today.  2.  Endometrial biopsy today.  3.  Follow up in 2 weeks for discussion of results  plus or minus posting or      some sort of treatment.  This patient seems to be refusing birth control      at this time.      TP/MEDQ  D:  10/19/2004  T:  10/19/2004  Job:  [id number]

## 2010-12-24 ENCOUNTER — Inpatient Hospital Stay (INDEPENDENT_AMBULATORY_CARE_PROVIDER_SITE_OTHER)
Admission: RE | Admit: 2010-12-24 | Discharge: 2010-12-24 | Disposition: A | Discharge status: Home / Self Care | Payer: Medicaid Other | Source: Ambulatory Visit | Attending: Family Medicine | Admitting: Family Medicine

## 2010-12-24 DIAGNOSIS — L03211 Cellulitis of face: Secondary | ICD-10-CM

## 2010-12-24 DIAGNOSIS — L0201 Cutaneous abscess of face: Secondary | ICD-10-CM

## 2011-04-30 ENCOUNTER — Emergency Department (HOSPITAL_COMMUNITY)
Admission: EM | Admit: 2011-04-30 | Discharge: 2011-04-30 | Disposition: A | Discharge status: Home / Self Care | Payer: Medicaid Other | Source: Home / Self Care

## 2011-04-30 DIAGNOSIS — K529 Noninfective gastroenteritis and colitis, unspecified: Secondary | ICD-10-CM

## 2011-04-30 DIAGNOSIS — K5289 Other specified noninfective gastroenteritis and colitis: Secondary | ICD-10-CM

## 2011-04-30 DIAGNOSIS — M940 Chondrocostal junction syndrome [Tietze]: Secondary | ICD-10-CM

## 2011-04-30 HISTORY — DX: Irritable bowel syndrome, unspecified: K58.9

## 2011-04-30 HISTORY — DX: Bipolar disorder, unspecified: F31.9

## 2011-04-30 HISTORY — DX: Disorder of thyroid, unspecified: E07.9

## 2011-04-30 MED ORDER — IBUPROFEN 800 MG PO TABS: 800.0000 mg | ORAL_TABLET | Freq: Three times a day (TID) | ORAL | Status: AC

## 2011-04-30 NOTE — ED Notes (Signed)
Patient complains of diarrhea and vomiting since Saturday.  Patient states she did not go to work yesterday due to weakness.  Patient also complains of upper chest pain since yesterday.  Patient states pain is worse with movement and palpation. Patient states she has a productive cough.  Patient denies fever.  Patient also complains of right shoulder pain.  Patient states she has a history of right shoulder bursitis.  Patient requesting "cortisone shot".  Patient's final complaint is abscess to right side of face.  Patient states she missed last appointment at [facility] and can not return until January.  Patient states if we can't take care of all her problems today she still needs a note for work.

## 2011-04-30 NOTE — ED Provider Notes (Signed)
History     CSN: [id number] Arrival date & time: 04/30/2011  6:23 PM   None     Chief Complaint  Patient presents with  . Diarrhea  . Chest Pain  . Shoulder Pain    (Consider location/radiation/quality/duration/timing/severity/associated sxs/prior treatment) HPI Comments: Onset of vomiting and diarrhea 5:30 am Saturday morning (2 days ago). Lasted for one day and resolved. Yesterday (Sunday) developed a mild occasional cough, and c/o pain across upper chest with deep inspiration and certain positions. No abd pain, wheezing or dyspnea. Pt missed work today due to symptoms and is requesting a note to return to work tomorrow.   Patient is a 40 y.o. female presenting with chest pain and shoulder pain. The history is provided by the patient.  Chest Pain Primary symptoms include cough (nonproductive, occasional). Pertinent negatives for primary symptoms include no fever, no shortness of breath, no wheezing and no abdominal pain.    Shoulder Pain Associated symptoms include chest pain. Pertinent negatives include no abdominal pain and no shortness of breath.    Past Medical History  Diagnosis Date  . Bipolar 1 disorder   . IBS (irritable bowel syndrome)   . Thyroid dysfunction     Past Surgical History  Procedure Date  . Cholecystectomy   . Tubal ligation   . Colon surgery     History reviewed. No pertinent family history.  History  Substance Use Topics  . Smoking status: Current Everyday Smoker -- 1.0 packs/day  . Smokeless tobacco: Not on file  . Alcohol Use: No    OB History    Grav Para Term Preterm Abortions TAB SAB Ect Mult Living                  Review of Systems  Constitutional: Positive for appetite change (decreased, but improving). Negative for fever and chills.  Respiratory: Positive for cough (nonproductive, occasional). Negative for shortness of breath and wheezing.   Cardiovascular: Positive for chest pain.  Gastrointestinal: Negative for  abdominal pain and blood in stool.  Genitourinary: Negative for dysuria and decreased urine volume.  Musculoskeletal: Negative for back pain.    Allergies  Latex and Penicillins  Home Medications   Current Outpatient Rx  Name Route Sig Dispense Refill  . DOXYCYCLINE HYCLATE 20 MG PO TABS Oral Take 20 mg by mouth QID.      Marland Kitchen LEVOTHYROXINE SODIUM 150 MCG PO TABS Oral Take 150 mcg by mouth daily.      Marland Kitchen LITHIUM CARBONATE 300 MG PO CAPS Oral Take 300 mg by mouth 2 (two) times daily with a meal.      . IBUPROFEN 800 MG PO TABS Oral Take 1 tablet (800 mg total) by mouth 3 (three) times daily. 15 tablet 0    BP 127/75  Pulse 60  Temp(Src) 97.9 F (36.6 C) (Oral)  Resp 16  SpO2 100%  LMP 04/23/2011  Physical Exam  Nursing note and vitals reviewed. Constitutional: She appears well-developed and well-nourished. No distress.  HENT:  Head: Normocephalic and atraumatic.  Right Ear: Tympanic membrane, external ear and ear canal normal.  Left Ear: Tympanic membrane, external ear and ear canal normal.  Nose: Nose normal.  Mouth/Throat: Uvula is midline, oropharynx is clear and moist and mucous membranes are normal. No oropharyngeal exudate, posterior oropharyngeal edema or posterior oropharyngeal erythema.  Neck: Neck supple.  Cardiovascular: Normal rate, regular rhythm and normal heart sounds.   Pulmonary/Chest: Effort normal and breath sounds normal. No respiratory distress. She exhibits tenderness (  TTP bilat sternal borders). She exhibits no crepitus, no edema, no deformity and no swelling.  Abdominal: Soft. Bowel sounds are normal. She exhibits no distension and no mass. There is no tenderness.  Lymphadenopathy:    She has no cervical adenopathy.  Neurological: She is alert.  Skin: Skin is warm and dry.  Psychiatric: She has a normal mood and affect.    ED Course  Procedures (including critical care time)  Labs Reviewed - No data to display No results found.   1.  Gastroenteritis   2. Costochondritis, acute       MDM   Pt to f/u with her PCP regarding her chronic shoulder pain.        Melody Comas, [place] 04/30/11 [phone]

## 2011-05-03 NOTE — ED Provider Notes (Signed)
Medical screening examination/treatment/procedure(s) were performed by non-physician practitioner and as supervising physician I was immediately available for consultation/collaboration.   MORENO-COLL,Janelle Spellman; MD   Jennilyn Esteve Moreno-Coll, MD 05/03/11 1503 

## 2011-06-06 ENCOUNTER — Other Ambulatory Visit (HOSPITAL_COMMUNITY)
Admission: RE | Admit: 2011-06-06 | Discharge: 2011-06-06 | Disposition: A | Discharge status: Home / Self Care | Payer: Medicaid Other | Source: Ambulatory Visit | Attending: Family Medicine | Admitting: Family Medicine

## 2011-06-06 ENCOUNTER — Other Ambulatory Visit (HOSPITAL_COMMUNITY): Payer: Self-pay | Admitting: Family Medicine

## 2011-06-06 ENCOUNTER — Other Ambulatory Visit: Payer: Self-pay | Admitting: Family Medicine

## 2011-06-06 DIAGNOSIS — Z01419 Encounter for gynecological examination (general) (routine) without abnormal findings: Secondary | ICD-10-CM | POA: Insufficient documentation

## 2011-06-07 ENCOUNTER — Other Ambulatory Visit (HOSPITAL_COMMUNITY): Payer: Self-pay | Admitting: Family Medicine

## 2011-06-11 ENCOUNTER — Other Ambulatory Visit (HOSPITAL_COMMUNITY): Payer: Self-pay | Admitting: Family Medicine

## 2011-06-11 DIAGNOSIS — Z1231 Encounter for screening mammogram for malignant neoplasm of breast: Secondary | ICD-10-CM

## 2011-07-09 ENCOUNTER — Ambulatory Visit (HOSPITAL_COMMUNITY)
Admission: RE | Admit: 2011-07-09 | Discharge: 2011-07-09 | Disposition: A | Discharge status: Home / Self Care | Payer: Self-pay | Source: Ambulatory Visit | Attending: Family Medicine | Admitting: Family Medicine

## 2011-07-09 DIAGNOSIS — Z1231 Encounter for screening mammogram for malignant neoplasm of breast: Secondary | ICD-10-CM | POA: Insufficient documentation

## 2011-07-17 ENCOUNTER — Other Ambulatory Visit: Payer: Self-pay | Admitting: Family Medicine

## 2011-07-17 DIAGNOSIS — R928 Other abnormal and inconclusive findings on diagnostic imaging of breast: Secondary | ICD-10-CM

## 2011-07-24 ENCOUNTER — Other Ambulatory Visit: Payer: Medicaid Other

## 2011-08-06 ENCOUNTER — Encounter: Payer: Self-pay | Admitting: *Deleted

## 2011-08-14 ENCOUNTER — Ambulatory Visit (INDEPENDENT_AMBULATORY_CARE_PROVIDER_SITE_OTHER): Payer: Self-pay | Admitting: *Deleted

## 2011-08-14 VITALS — BP 119/80 | HR 65 | Temp 97.9°F | Ht 64.0 in | Wt 186.7 lb

## 2011-08-14 DIAGNOSIS — Z1239 Encounter for other screening for malignant neoplasm of breast: Secondary | ICD-10-CM

## 2011-08-14 NOTE — Patient Instructions (Signed)
Taught patient how to perform BSE and gave educational materials to take home. Patient did not need a Pap smear today due to last Pap smear was 06/06/11 and was normal. Let her know BCCCP will cover Pap smears every 3 years unless has a history of abnormal Pap smears.Patient gets Pap smears completed at [facility]. Told her to continue getting them per her physician's recommendation.  Patient is scheduled for a left breast diagnostic mammogram and possible left breast ultrasound next Tuesday, August 21, 2011 at 0850. Patient aware of appointment and will be there. Let patient know will follow up with her within the next couple weeks with results. Talked with patient about quitting smoking. Patient stated she knows its bad and wants to quit. Told her about the free smoking cessation classes at the [facility] and gave her phone number to the Quit Now line to get free patches if interested. Patient verbalized understanding.

## 2011-08-14 NOTE — Progress Notes (Signed)
Referred from the [facility] for additional imaging of the left breast. Initial mammogram was completed 07/09/11.  Pap Smear:    Pap smear not performed today. Patients last Pap smear was 06/06/11 at [facility] and was normal. Per patient she had an abnormal Pap smear around 1994 that required cryo for follow up. Pap smear result above is in EPIC.  Physical exam: Breasts Breasts symmetrical. No skin abnormalities bilateral breasts. No nipple retraction bilateral breasts. No nipple discharge bilateral breasts. No lymphadenopathy. No lumps palpated bilateral breasts. Patient complained of breast tenderness in left outer breast. Patient referred to the [facility] for left diagnostic mammogram and left breast ultrasound per recommendation from the [facility] [place]. Appointment is scheduled for Tuesday, August 21, 2011 at 0850.         Pelvic/Bimanual No Pap smear completed today since last Pap smear was 06/06/11 and normal. Pap smear not indicated per BCCCP guidelines.

## 2011-08-21 ENCOUNTER — Ambulatory Visit
Admission: RE | Admit: 2011-08-21 | Discharge: 2011-08-21 | Disposition: A | Discharge status: Home / Self Care | Payer: No Typology Code available for payment source | Source: Ambulatory Visit | Attending: Family Medicine | Admitting: Family Medicine

## 2011-08-21 DIAGNOSIS — R928 Other abnormal and inconclusive findings on diagnostic imaging of breast: Secondary | ICD-10-CM

## 2011-08-28 ENCOUNTER — Telehealth: Payer: Self-pay | Admitting: *Deleted

## 2011-08-28 NOTE — Telephone Encounter (Signed)
Telephoned pt at home # and advised results of diagnostic mammogram were benign. Pt voiced understanding.

## 2011-12-10 ENCOUNTER — Emergency Department (HOSPITAL_COMMUNITY)
Admission: EM | Admit: 2011-12-10 | Discharge: 2011-12-10 | Disposition: A | Discharge status: Home / Self Care | Payer: Self-pay | Attending: Emergency Medicine | Admitting: Emergency Medicine

## 2011-12-10 ENCOUNTER — Emergency Department (HOSPITAL_COMMUNITY): Payer: Self-pay

## 2011-12-10 ENCOUNTER — Encounter (HOSPITAL_COMMUNITY): Payer: Self-pay | Admitting: Emergency Medicine

## 2011-12-10 DIAGNOSIS — S9000XA Contusion of unspecified ankle, initial encounter: Secondary | ICD-10-CM | POA: Insufficient documentation

## 2011-12-10 DIAGNOSIS — K589 Irritable bowel syndrome without diarrhea: Secondary | ICD-10-CM | POA: Insufficient documentation

## 2011-12-10 DIAGNOSIS — Z79899 Other long term (current) drug therapy: Secondary | ICD-10-CM | POA: Insufficient documentation

## 2011-12-10 DIAGNOSIS — Y92009 Unspecified place in unspecified non-institutional (private) residence as the place of occurrence of the external cause: Secondary | ICD-10-CM | POA: Insufficient documentation

## 2011-12-10 DIAGNOSIS — F17200 Nicotine dependence, unspecified, uncomplicated: Secondary | ICD-10-CM | POA: Insufficient documentation

## 2011-12-10 DIAGNOSIS — F319 Bipolar disorder, unspecified: Secondary | ICD-10-CM | POA: Insufficient documentation

## 2011-12-10 DIAGNOSIS — W208XXA Other cause of strike by thrown, projected or falling object, initial encounter: Secondary | ICD-10-CM | POA: Insufficient documentation

## 2011-12-10 LAB — CBC WITH DIFFERENTIAL/PLATELET
Basophils Absolute: 0 10*3/uL (ref 0.0–0.1)
Basophils Relative: 1 % (ref 0–1)
Eosinophils Absolute: 0.1 10*3/uL (ref 0.0–0.7)
Eosinophils Relative: 3 % (ref 0–5)
HCT: 29.7 % — ABNORMAL LOW (ref 36.0–46.0)
Hemoglobin: 8.8 g/dL — ABNORMAL LOW (ref 12.0–15.0)
Lymphocytes Relative: 30 % (ref 12–46)
Lymphs Abs: 1.6 10*3/uL (ref 0.7–4.0)
MCH: 23.2 pg — ABNORMAL LOW (ref 26.0–34.0)
MCHC: 29.6 g/dL — ABNORMAL LOW (ref 30.0–36.0)
MCV: 78.4 fL (ref 78.0–100.0)
Monocytes Absolute: 0.4 10*3/uL (ref 0.1–1.0)
Monocytes Relative: 7 % (ref 3–12)
Neutro Abs: 3.3 10*3/uL (ref 1.7–7.7)
Neutrophils Relative %: 61 % (ref 43–77)
Platelets: 196 10*3/uL (ref 150–400)
RBC: 3.79 MIL/uL — ABNORMAL LOW (ref 3.87–5.11)
RDW: 16.6 % — ABNORMAL HIGH (ref 11.5–15.5)
WBC: 5.5 10*3/uL (ref 4.0–10.5)

## 2011-12-10 LAB — D-DIMER, QUANTITATIVE: D-Dimer, Quant: 0.36 ug/mL-FEU (ref 0.00–0.48)

## 2011-12-10 MED ORDER — TRAMADOL HCL 50 MG PO TABS: 50.0000 mg | ORAL_TABLET | Freq: Four times a day (QID) | ORAL | Status: AC | PRN

## 2011-12-10 MED ORDER — TRAMADOL HCL 50 MG PO TABS
50.0000 mg | ORAL_TABLET | Freq: Once | ORAL | Status: AC
  Administered 2011-12-10: 50 mg via ORAL
  Filled 2011-12-10: qty 1

## 2011-12-10 NOTE — ED Provider Notes (Signed)
History     CSN: [id number]  Arrival date & time 12/10/11  1848   None     Chief Complaint  Patient presents with  . Ankle Pain    (Consider location/radiation/quality/duration/timing/severity/associated sxs/prior treatment) HPI Comments: Patient states she dropped a countertop onto her legs.  A week ago, striking the anterior shin just above the ankle, left, more so than right.  She did have is small abrasion or you but did notice that in the last 2, days.  She's had an increase in swelling.  She has not taken any over-the-counter medication except Tylenol for pain.  She has not wrapped the ankle.  She has tried elevating it for the past day, but is still having discomfort, and a moderate amount of swelling.  No discoloration  Patient is a 41 y.o. female presenting with ankle pain. The history is provided by the patient.  Ankle Pain  The incident occurred more than 1 week ago. The incident occurred at home. The injury mechanism was a direct blow. The pain is present in the left ankle and left foot. The quality of the pain is described as aching. The pain is at a severity of 3/10. The pain is mild. The pain has been constant since onset. Pertinent negatives include no numbness, no inability to bear weight, no loss of motion, no loss of sensation and no tingling.    Past Medical History  Diagnosis Date  . Bipolar 1 disorder   . IBS (irritable bowel syndrome)   . Thyroid dysfunction   . Abnormal Pap smear     cryo    Past Surgical History  Procedure Date  . Cholecystectomy   . Tubal ligation   . Colon surgery   . Dilation and curettage of uterus     Family History  Problem Relation Age of Onset  . Breast cancer Maternal Grandmother   . Heart disease Father   . Hypertension Mother   . Breast cancer Paternal Aunt   . Breast cancer Maternal Aunt     History  Substance Use Topics  . Smoking status: Current Everyday Smoker -- 1.0 packs/day  . Smokeless tobacco: Never Used    . Alcohol Use: No    OB History    Grav Para Term Preterm Abortions TAB SAB Ect Mult Living   3 2 1 1 1 1    1       Review of Systems  Constitutional: Negative for fever and chills.  Musculoskeletal: Positive for joint swelling.  Skin: Positive for wound.  Neurological: Negative for tingling and numbness.    Allergies  Latex and Penicillins  Home Medications   Current Outpatient Rx  Name Route Sig Dispense Refill  . ACETAMINOPHEN 500 MG PO TABS Oral Take 1,000 mg by mouth every 6 (six) hours as needed. For pain    . CALCIUM PO Oral Take 1 tablet by mouth daily.    Marland Kitchen VITAMIN B-12 PO Oral Take 1 tablet by mouth daily.    . OMEGA-3 FATTY ACIDS 1000 MG PO CAPS Oral Take 1 g by mouth daily.    Marland Kitchen LEVOTHYROXINE SODIUM 150 MCG PO TABS Oral Take 150 mcg by mouth daily.      Marland Kitchen LITHIUM CARBONATE 300 MG PO CAPS Oral Take 300 mg by mouth 2 (two) times daily with a meal.      . TRAMADOL HCL 50 MG PO TABS Oral Take 1 tablet (50 mg total) by mouth every 6 (six) hours as needed for  pain. 15 tablet 0    BP 116/78  Pulse 58  Temp 97.6 F (36.4 C) (Oral)  Resp 18  SpO2 99%  Physical Exam  Constitutional: She appears well-developed.  Neck: Normal range of motion.  Cardiovascular: Normal rate.   Pulmonary/Chest: She is in respiratory distress.  Musculoskeletal:       Left ankle: She exhibits swelling and ecchymosis. She exhibits normal range of motion and no deformity. tenderness.  Neurological: She is alert.  Skin: Skin is warm. No erythema.    ED Course  Procedures (including critical care time)  Labs Reviewed  CBC WITH DIFFERENTIAL - Abnormal; Notable for the following:    RBC 3.79 (*)     Hemoglobin 8.8 (*)     HCT 29.7 (*)     MCH 23.2 (*)     MCHC 29.6 (*)     RDW 16.6 (*)     All other components within normal limits  D-DIMER, QUANTITATIVE   Dg Ankle Complete Left  12/10/2011  *RADIOLOGY REPORT*  Clinical Data: Struck by heavy piece of lumbar 1 week ago with pain  and swelling  LEFT ANKLE COMPLETE - 3+ VIEW  Comparison: Left ankle films of 10/30/2009  Findings: No acute fracture is seen.  The ankle joint appears normal.  Alignment is normal.  IMPRESSION: No fracture.  Original Report Authenticated By: Juline Patch, M.D.     1. Contusion, ankle       MDM   Will evaluate for DVT and infection   Normal WBC and negative D dimer will apply Ace wrap and NSAID's on a a regular basis         Arman Filter, NP 12/11/11 0214

## 2011-12-10 NOTE — ED Notes (Signed)
Pt c/o left ankle pain after dropping piece of counter top on left foot 1 week ago; some swelling noted

## 2011-12-10 NOTE — ED Notes (Signed)
Left ankle/ foot  pain after dropping large piece of wood on it a week ago, it cont to swell and be painful has good pulse and can wiggle toes

## 2011-12-13 NOTE — ED Provider Notes (Signed)
Medical screening examination/treatment/procedure(s) were performed by non-physician practitioner and as supervising physician I was immediately available for consultation/collaboration.  Ethelda Chick, MD 12/13/11 [phone]

## 2012-02-20 ENCOUNTER — Emergency Department (INDEPENDENT_AMBULATORY_CARE_PROVIDER_SITE_OTHER)
Admission: EM | Admit: 2012-02-20 | Discharge: 2012-02-20 | Disposition: A | Discharge status: Home / Self Care | Payer: Self-pay | Source: Home / Self Care | Attending: Emergency Medicine | Admitting: Emergency Medicine

## 2012-02-20 ENCOUNTER — Encounter (HOSPITAL_COMMUNITY): Payer: Self-pay

## 2012-02-20 DIAGNOSIS — J069 Acute upper respiratory infection, unspecified: Secondary | ICD-10-CM

## 2012-02-20 MED ORDER — ALBUTEROL SULFATE HFA 108 (90 BASE) MCG/ACT IN AERS: 1.0000 | INHALATION_SPRAY | Freq: Four times a day (QID) | RESPIRATORY_TRACT | Status: DC | PRN

## 2012-02-20 MED ORDER — PREDNISONE 5 MG PO KIT: 1.0000 | PACK | Freq: Every day | ORAL | Status: DC

## 2012-02-20 MED ORDER — AZITHROMYCIN 250 MG PO TABS: ORAL_TABLET | ORAL | Status: DC

## 2012-02-20 MED ORDER — BENZONATATE 200 MG PO CAPS: 200.0000 mg | ORAL_CAPSULE | Freq: Three times a day (TID) | ORAL | Status: DC | PRN

## 2012-02-20 NOTE — ED Notes (Signed)
C/o cough, sinus pressure, generalized pain since yesterday; NAD

## 2012-02-20 NOTE — ED Provider Notes (Signed)
Chief Complaint  Patient presents with  . Cough    History of Present Illness:   Kristen Moore is a 41 year old female who has had a two-day history of cough productive of green sputum and tightness in the chest. She denies any wheezing or anterior chest pain. She is a cigarette smoker. She also complains of sore throat, nasal congestion, clear rhinorrhea, headache, left ear pressure, soreness of the tongue, neck soreness, soreness in the upper back, tiredness, fatigue, lack of energy, weakness, aching all over, feeling feverish, and chills. She has a history of bipolar disorder, thyroid disease, fibroid tumors, irregular menses. She is taking lithium and thyroid. She is allergic to penicillin and latex. She denies any GI complaints.  Review of Systems:  Other than noted above, the patient denies any of the following symptoms. Systemic:  No fever, chills, sweats, fatigue, myalgias, headache, or anorexia. Eye:  No redness, pain or drainage. ENT:  No earache, ear congestion, nasal congestion, sneezing, rhinorrhea, sinus pressure, sinus pain, post nasal drip, or sore throat. Lungs:  No cough, sputum production, wheezing, shortness of breath, or chest pain. GI:  No abdominal pain, nausea, vomiting, or diarrhea.  PMFSH:  Past medical history, family history, social history, meds, and allergies were reviewed.  Physical Exam:   Vital signs:  BP 120/73  Pulse 60  Temp 98.1 F (36.7 C) (Oral)  Resp 18  SpO2 100%  LMP 02/18/2012 General:  Alert, in no distress. Eye:  No conjunctival injection or drainage. Lids were normal. ENT:  TMs and canals were normal, without erythema or inflammation.  Nasal mucosa was congested, particularly on the right side, without drainage.  Mucous membranes were moist.  Pharynx was clear, without exudate or drainage.  There were no oral ulcerations or lesions. Neck:  Supple, no adenopathy, tenderness or mass. Lungs:  No respiratory distress.  Lungs were clear to auscultation,  without wheezes, rales or rhonchi.  Breath sounds were clear and equal bilaterally.  Heart:  Regular rhythm, without gallops, murmers or rubs. Skin:  Clear, warm, and dry, without rash or lesions.  Assessment:  The encounter diagnosis was Viral upper respiratory infection.  Plan:   1.  The following meds were prescribed:   New Prescriptions   ALBUTEROL (PROVENTIL HFA;VENTOLIN HFA) 108 (90 BASE) MCG/ACT INHALER    Inhale 1-2 puffs into the lungs every 6 (six) hours as needed for wheezing.   AZITHROMYCIN (ZITHROMAX Z-PAK) 250 MG TABLET    Take as directed.   BENZONATATE (TESSALON) 200 MG CAPSULE    Take 1 capsule (200 mg total) by mouth 3 (three) times daily as needed for cough.   PREDNISONE 5 MG KIT    Take 1 kit (5 mg total) by mouth daily after breakfast. Prednisone 5 mg 6 day dosepack.  Take as directed.   2.  The patient was instructed in symptomatic care and handouts were given. 3.  The patient was told to return if becoming worse in any way, if no better in 3 or 4 days, and given some red flag symptoms that would indicate earlier return.  The patient was told not to get the prescription for antibiotic filled unless her respiratory symptoms had persisted for more than 7 to 10 days.  She was strongly encouraged to quit smoking and information was given about smoking cessation.   Reuben Likes, MD 02/20/12 [phone]

## 2012-02-27 ENCOUNTER — Encounter (HOSPITAL_COMMUNITY): Payer: Self-pay | Admitting: Adult Health

## 2012-02-27 ENCOUNTER — Emergency Department (HOSPITAL_COMMUNITY)
Admission: EM | Admit: 2012-02-27 | Discharge: 2012-02-27 | Discharge status: Absent without leave | Payer: Self-pay | Source: Home / Self Care

## 2012-02-27 ENCOUNTER — Emergency Department (HOSPITAL_COMMUNITY): Payer: Self-pay

## 2012-02-27 ENCOUNTER — Emergency Department (HOSPITAL_COMMUNITY)
Admission: EM | Admit: 2012-02-27 | Discharge: 2012-02-27 | Disposition: A | Discharge status: Home / Self Care | Payer: Self-pay | Attending: Emergency Medicine | Admitting: Emergency Medicine

## 2012-02-27 DIAGNOSIS — F17200 Nicotine dependence, unspecified, uncomplicated: Secondary | ICD-10-CM | POA: Insufficient documentation

## 2012-02-27 DIAGNOSIS — F319 Bipolar disorder, unspecified: Secondary | ICD-10-CM | POA: Insufficient documentation

## 2012-02-27 DIAGNOSIS — Z880 Allergy status to penicillin: Secondary | ICD-10-CM | POA: Insufficient documentation

## 2012-02-27 DIAGNOSIS — K589 Irritable bowel syndrome without diarrhea: Secondary | ICD-10-CM | POA: Insufficient documentation

## 2012-02-27 DIAGNOSIS — J329 Chronic sinusitis, unspecified: Secondary | ICD-10-CM | POA: Insufficient documentation

## 2012-02-27 DIAGNOSIS — E079 Disorder of thyroid, unspecified: Secondary | ICD-10-CM | POA: Insufficient documentation

## 2012-02-27 DIAGNOSIS — Z79899 Other long term (current) drug therapy: Secondary | ICD-10-CM | POA: Insufficient documentation

## 2012-02-27 DIAGNOSIS — Z91040 Latex allergy status: Secondary | ICD-10-CM | POA: Insufficient documentation

## 2012-02-27 LAB — RAPID STREP SCREEN (MED CTR MEBANE ONLY): Streptococcus, Group A Screen (Direct): NEGATIVE

## 2012-02-27 MED ORDER — DOXYCYCLINE HYCLATE 100 MG PO CAPS: 100.0000 mg | ORAL_CAPSULE | Freq: Two times a day (BID) | ORAL | Status: DC

## 2012-02-27 MED ORDER — IBUPROFEN 800 MG PO TABS: 800.0000 mg | ORAL_TABLET | Freq: Three times a day (TID) | ORAL | Status: DC

## 2012-02-27 MED ORDER — ALBUTEROL SULFATE HFA 108 (90 BASE) MCG/ACT IN AERS
2.0000 | INHALATION_SPRAY | Freq: Once | RESPIRATORY_TRACT | Status: AC
  Administered 2012-02-27: 2 via RESPIRATORY_TRACT
  Filled 2012-02-27: qty 6.7

## 2012-02-27 NOTE — ED Notes (Signed)
Reports URI dx at [facility] on the 18th. Works at [employer] and went back to work Monday, started feeling bad again and was sent home, told she needs a note with a DX and stating she can return before she can return to work. C/o runny nose, cough, phlegm, pressure behind eyes and nose, ear ache and headache.

## 2012-02-27 NOTE — ED Notes (Signed)
Pt states that she needs a note that states pt can go back to work. Pt c/o sore throat, nasal congestion, mild productive cough.

## 2012-02-27 NOTE — ED Notes (Signed)
Pt d/c home in NAD. Pt voiced understanding of d/c instructions and follow up care.  

## 2012-02-27 NOTE — ED Provider Notes (Signed)
History  This chart was scribed for Glynn Octave, MD by Ardeen Jourdain. This patient was seen in room TR05C/TR05C and the patient's care was started at 1524.  CSN: [id number]  Arrival date & time 02/27/12  1524   None     Chief Complaint  Patient presents with  . URI     The history is provided by the patient. No language interpreter was used.    Kristen Moore is a 41 y.o. female who presents to the Emergency Department complaining of a URI. She states being diagnosed with a URI on the 18th at an [facility]. She has associated runny nose, sore throat, productive cough with sputum, chest soreness and sore tongue. She denies fever, visual disturbance, CP, SOB, abdominal pain, nausea, emesis, urinary symptoms, back pain, HA, weakness, numbness and rash as associated symptoms. She was given azithromycin with no reduction of symptoms. She admits to sick contact. She works at [employer] on [employer] [address] [employer], and returned to work on Monday, but was sent home. She needs a work note to return. She has a h/o Bipolar 1 disorder, IBS and thyroid dysfunction. She is a current everyday smoker but denies alcohol use.  The patient smokes 1 pack per day.  No PCP.    Past Medical History  Diagnosis Date  . Bipolar 1 disorder   . IBS (irritable bowel syndrome)   . Thyroid dysfunction   . Abnormal Pap smear     cryo    Past Surgical History  Procedure Date  . Cholecystectomy   . Tubal ligation   . Colon surgery   . Dilation and curettage of uterus     Family History  Problem Relation Age of Onset  . Breast cancer Maternal Grandmother   . Heart disease Father   . Hypertension Mother   . Breast cancer Paternal Aunt   . Breast cancer Maternal Aunt     History  Substance Use Topics  . Smoking status: Current Every Day Smoker -- 1.0 packs/day  . Smokeless tobacco: Never Used  . Alcohol Use: No    OB History    Grav Para Term Preterm Abortions TAB SAB Ect Mult Living   3 2 1 1 1 1    1        Review of Systems  A complete 10 system review of systems was obtained and all systems are negative except as noted in the HPI and PMH.    Allergies  Latex; Penicillins; and Other  Home Medications   Current Outpatient Rx  Name Route Sig Dispense Refill  . ALBUTEROL SULFATE HFA 108 (90 BASE) MCG/ACT IN AERS Inhalation Inhale 1-2 puffs into the lungs every 6 (six) hours as needed for wheezing. 1 Inhaler 0  . ASPIRIN EFFERVESCENT 325 MG PO TBEF Oral Take 325 mg by mouth every 6 (six) hours as needed. For congestion    . CALCIUM PO Oral Take 1 tablet by mouth daily.    Marland Kitchen VITAMIN B-12 PO Oral Take 1 tablet by mouth daily.    . OMEGA-3 FATTY ACIDS 1000 MG PO CAPS Oral Take 1 g by mouth daily.    Marland Kitchen LEVOTHYROXINE SODIUM 150 MCG PO TABS Oral Take 150 mcg by mouth daily.      Marland Kitchen LITHIUM CARBONATE 300 MG PO CAPS Oral Take 300 mg by mouth 2 (two) times daily with a meal.        Triage Vitals: BP 111/73  Pulse 63  Temp 98.3 F (36.8 C) (Oral)  Resp 20  SpO2 100%  LMP 02/18/2012  Physical Exam  Nursing note and vitals reviewed. Constitutional: She is oriented to person, place, and time. She appears well-developed and well-nourished. No distress.  HENT:  Head: Normocephalic and atraumatic.  Mouth/Throat: Oropharynx is clear and moist. No oropharyngeal exudate.       Upper airway congestion  Eyes: EOM are normal. Pupils are equal, round, and reactive to light.  Neck: Normal range of motion. Neck supple. No tracheal deviation present.       No meningismus   Cardiovascular: Normal rate, regular rhythm and normal heart sounds.   Pulmonary/Chest: Effort normal and breath sounds normal. No respiratory distress.  Abdominal: Soft. Bowel sounds are normal. There is no tenderness.  Musculoskeletal: Normal range of motion.  Neurological: She is alert and oriented to person, place, and time.  Skin: Skin is warm and dry.  Psychiatric: She has a normal mood and affect. Her behavior is  normal.    ED Course  Procedures (including critical care time)  DIAGNOSTIC STUDIES: Oxygen Saturation is 100% on room air, normal by my interpretation.    COORDINATION OF CARE:  1620- Discussed treatment plan with pt at bedside including chest x-ray and rapid strep screen and pt agreed to plan.  16:30-Medication Orders: Albuterol (Proventil HFA; Ventolin HFA) 108 (90 base) mcg/act inhaler 2 puff-once.   Results for orders placed during the hospital encounter of 02/27/12  RAPID STREP SCREEN      Component Value Range   Streptococcus, Group A Screen (Direct) NEGATIVE  NEGATIVE    Dg Chest 2 View  02/27/2012  *RADIOLOGY REPORT*  Clinical Data: Upper respiratory infection.  Smoker.  CHEST - 2 VIEW  Comparison: None.  Findings: Heart and mediastinal contours are within normal limits. No focal opacities or effusions.  No acute bony abnormality.  IMPRESSION: No active cardiopulmonary disease.   Original Report Authenticated By: Cyndie Chime, M.D.      No diagnosis found.    MDM  7 days of cough, congestion, runny nose, headache and body ache. Seen at urgent care on September 18 and treated with azithromycin. Did not fill prednisone, albuterol.  +congestion, oropharynx clear. Lungs clear. No meningismus. Nontoxic appearing.  Treat for sinusitis, albuterol, smoking cessation.     I personally performed the services described in this documentation, which was scribed in my presence.  The recorded information has been reviewed and considered.    Glynn Octave, MD 02/27/12 1729

## 2012-07-03 ENCOUNTER — Other Ambulatory Visit: Payer: Self-pay | Admitting: Obstetrics and Gynecology

## 2012-07-03 DIAGNOSIS — Z1231 Encounter for screening mammogram for malignant neoplasm of breast: Secondary | ICD-10-CM

## 2012-07-10 ENCOUNTER — Ambulatory Visit (HOSPITAL_COMMUNITY): Payer: Self-pay

## 2012-07-11 ENCOUNTER — Ambulatory Visit (HOSPITAL_COMMUNITY)
Admission: RE | Admit: 2012-07-11 | Discharge: 2012-07-11 | Disposition: A | Discharge status: Home / Self Care | Payer: Self-pay | Source: Ambulatory Visit | Attending: Obstetrics and Gynecology | Admitting: Obstetrics and Gynecology

## 2012-07-11 DIAGNOSIS — Z1231 Encounter for screening mammogram for malignant neoplasm of breast: Secondary | ICD-10-CM

## 2012-07-23 ENCOUNTER — Telehealth (HOSPITAL_COMMUNITY): Payer: Self-pay | Admitting: *Deleted

## 2012-07-23 ENCOUNTER — Other Ambulatory Visit: Payer: Self-pay | Admitting: Obstetrics and Gynecology

## 2012-07-23 DIAGNOSIS — Z1231 Encounter for screening mammogram for malignant neoplasm of breast: Secondary | ICD-10-CM

## 2012-07-23 NOTE — Telephone Encounter (Signed)
Patient called me in regards to scheduling her screening mammogram and [facility]. Let her know she is due for her screening mammogram and will be covered by [facility] if has completed by August 13, 2012. Clarified with Mammography that patient needs a screening mammogram. Spoke with Gunnar Fusi to reschedule her mammogram to next Tuesday, July 29, 2012 since will be in [facility] clinic that day if any additional questions. Appointment scheduled for 07/29/12 at 0900. Called patient back with appointment time and date. Patient verbalized understanding.

## 2012-07-29 ENCOUNTER — Ambulatory Visit (HOSPITAL_COMMUNITY): Payer: Self-pay

## 2012-07-29 ENCOUNTER — Ambulatory Visit (HOSPITAL_COMMUNITY)
Admission: RE | Admit: 2012-07-29 | Discharge: 2012-07-29 | Disposition: A | Discharge status: Home / Self Care | Payer: Self-pay | Source: Ambulatory Visit | Attending: Obstetrics and Gynecology | Admitting: Obstetrics and Gynecology

## 2012-07-29 DIAGNOSIS — Z1231 Encounter for screening mammogram for malignant neoplasm of breast: Secondary | ICD-10-CM

## 2012-08-27 ENCOUNTER — Encounter: Payer: Self-pay | Admitting: Obstetrics & Gynecology

## 2012-08-27 ENCOUNTER — Other Ambulatory Visit (HOSPITAL_COMMUNITY)
Admission: RE | Admit: 2012-08-27 | Discharge: 2012-08-27 | Disposition: A | Discharge status: Home / Self Care | Payer: Self-pay | Source: Ambulatory Visit | Attending: Obstetrics & Gynecology | Admitting: Obstetrics & Gynecology

## 2012-08-27 ENCOUNTER — Ambulatory Visit (INDEPENDENT_AMBULATORY_CARE_PROVIDER_SITE_OTHER): Payer: Self-pay | Admitting: Obstetrics & Gynecology

## 2012-08-27 VITALS — BP 123/84 | HR 59 | Ht 64.0 in | Wt 184.4 lb

## 2012-08-27 DIAGNOSIS — N938 Other specified abnormal uterine and vaginal bleeding: Secondary | ICD-10-CM

## 2012-08-27 DIAGNOSIS — N925 Other specified irregular menstruation: Secondary | ICD-10-CM | POA: Insufficient documentation

## 2012-08-27 DIAGNOSIS — N949 Unspecified condition associated with female genital organs and menstrual cycle: Secondary | ICD-10-CM

## 2012-08-27 DIAGNOSIS — N8500 Endometrial hyperplasia, unspecified: Secondary | ICD-10-CM | POA: Insufficient documentation

## 2012-08-27 DIAGNOSIS — Z01812 Encounter for preprocedural laboratory examination: Secondary | ICD-10-CM

## 2012-08-27 LAB — CBC
HCT: 35.4 % — ABNORMAL LOW (ref 36.0–46.0)
Hemoglobin: 10.6 g/dL — ABNORMAL LOW (ref 12.0–15.0)
MCH: 23.3 pg — ABNORMAL LOW (ref 26.0–34.0)
MCHC: 29.9 g/dL — ABNORMAL LOW (ref 30.0–36.0)
MCV: 78 fL (ref 78.0–100.0)
Platelets: 263 10*3/uL (ref 150–400)
RBC: 4.54 MIL/uL (ref 3.87–5.11)
RDW: 16.3 % — ABNORMAL HIGH (ref 11.5–15.5)
WBC: 5.7 10*3/uL (ref 4.0–10.5)

## 2012-08-27 LAB — POCT PREGNANCY, URINE: Preg Test, Ur: NEGATIVE

## 2012-08-27 NOTE — Progress Notes (Signed)
[facility] and [facility]

## 2012-08-27 NOTE — Progress Notes (Signed)
Here today because of fibroids and irregular bleeding, states sometimes heavy periods , then sometimes normal for about one year.States has a lot of stress.  Used to go to [facility]- given number to call for appointment at [facility] clinic [facility].

## 2012-08-27 NOTE — Progress Notes (Signed)
  Subjective:    Patient ID: Kristen Moore, female    DOB: [birth year], 42 y.o.   MRN: [id number]  HPI  42 yo DW G3P2A1 (LC1) who is here with a long h/o heavy painful periods with clots.   Review of Systems Distant h/o abnormal pap and cryo, pap due Mammogram UTD Abstinent for about 6 months.    Objective:   Physical Exam  Normal vulva   UPT negative, consent signed, time out done Cervix prepped with betadine and grasped with a single tooth tenaculum Uterus sounded to 9 cm Pipelle used for 2 passes with a moderate amount of tissue obtained. She tolerated the procedure well.       Assessment & Plan:   DUB- TSH, CBC, gyn u/s Await EMBX

## 2012-08-28 ENCOUNTER — Encounter: Payer: Self-pay | Admitting: *Deleted

## 2012-08-28 LAB — TSH: TSH: 1.137 u[IU]/mL (ref 0.350–4.500)

## 2012-09-03 ENCOUNTER — Encounter (HOSPITAL_COMMUNITY): Payer: Self-pay | Admitting: *Deleted

## 2012-09-03 ENCOUNTER — Emergency Department (INDEPENDENT_AMBULATORY_CARE_PROVIDER_SITE_OTHER)
Admission: EM | Admit: 2012-09-03 | Discharge: 2012-09-03 | Disposition: A | Discharge status: Home / Self Care | Payer: Self-pay | Source: Home / Self Care

## 2012-09-03 DIAGNOSIS — E039 Hypothyroidism, unspecified: Secondary | ICD-10-CM

## 2012-09-03 LAB — TSH: TSH: 4.725 u[IU]/mL — ABNORMAL HIGH (ref 0.350–4.500)

## 2012-09-03 LAB — T3, FREE: T3, Free: 2 pg/mL — ABNORMAL LOW (ref 2.3–4.2)

## 2012-09-03 LAB — T4: T4, Total: 8.6 ug/dL (ref 5.0–12.5)

## 2012-09-03 MED ORDER — LEVOTHYROXINE SODIUM 150 MCG PO TABS: 150.0000 ug | ORAL_TABLET | Freq: Every day | ORAL | Status: DC

## 2012-09-03 NOTE — ED Notes (Signed)
Patient Demographics  Kristen Moore, is a 42 y.o. female  WUX:[id number]  [id number]  DOB - March 24, 1971  Chief Complaint  Patient presents with  . Follow-up        Subjective:   Kristen Moore today here to establish care and get Synthroid script, no subjective complins.  Objective:   Past Medical History  Diagnosis Date  . Bipolar 1 disorder   . IBS (irritable bowel syndrome)   . Thyroid dysfunction   . Abnormal Pap smear     cryo  . Fibroids   . DUB (dysfunctional uterine bleeding)   . Cyst of breast     left breast      Past Surgical History  Procedure Laterality Date  . Cholecystectomy    . Tubal ligation    . Colon surgery    . Dilation and curettage of uterus       Filed Vitals:   09/03/12 1023  BP: 122/74  Pulse: 59  Temp: 98.1 F (36.7 C)  TempSrc: Oral  Resp: 16  SpO2: 99%     Exam  Awake Alert, Oriented X 3, No new F.N deficits, Normal affect .AT,PERRAL Supple Neck,No JVD, No cervical lymphadenopathy appriciated.  Symmetrical Chest wall movement, Good air movement bilaterally, CTAB RRR,No Gallops,Rubs or new Murmurs, No Parasternal Heave +ve B.Sounds, Abd Soft, Non tender, No organomegaly appriciated, No rebound - guarding or rigidity. No Cyanosis, Clubbing or edema, No new Rash or bruise       Data Review   CBC  Recent Labs Lab 08/27/12 1347  WBC 5.7  HGB 10.6*  HCT 35.4*  PLT 263  MCV 78.0  MCH 23.3*  MCHC 29.9*  RDW 16.3*    Chemistries   No results found for this basename: NA, K, CL, CO2, GLUCOSE, BUN, CREATININE, GFRCGP, CALCIUM, MG, AST, ALT, ALKPHOS, BILITOT,  in the last 168 hours ------------------------------------------------------------------------------------------------------------------ No results found for this basename: HGBA1C,  in the last 72 hours ------------------------------------------------------------------------------------------------------------------ No results found for this  basename: CHOL, HDL, LDLCALC, TRIG, CHOLHDL, LDLDIRECT,  in the last 72 hours ------------------------------------------------------------------------------------------------------------------ No results found for this basename: TSH, T4TOTAL, FREET3, T3FREE, THYROIDAB,  in the last 72 hours ------------------------------------------------------------------------------------------------------------------ No results found for this basename: VITAMINB12, FOLATE, FERRITIN, TIBC, IRON, RETICCTPCT,  in the last 72 hours  Coagulation profile  No results found for this basename: INR, PROTIME,  in the last 168 hours     Prior to Admission medications   Medication Sig Start Date End Date Taking? Authorizing Provider  albuterol (PROVENTIL HFA;VENTOLIN HFA) 108 (90 BASE) MCG/ACT inhaler Inhale 1-2 puffs into the lungs every 6 (six) hours as needed for wheezing. 02/20/12   Reuben Likes, MD  fish oil-omega-3 fatty acids 1000 MG capsule Take 1 g by mouth daily.    Historical Provider, MD  levothyroxine (SYNTHROID, LEVOTHROID) 150 MCG tablet Take 1 tablet (150 mcg total) by mouth daily. 09/03/12   Leroy Sea, MD  lithium carbonate 300 MG capsule Take 300 mg by mouth 2 (two) times daily with a meal.      Historical Provider, MD     Assessment & Plan   Hypothyroidism - synthroid refilled, TSH, Free T3 and T4 checked, back in 4 weeks.   H/O Bipolar-Dysfunctional U.Bleeding - has OB and Psych, asked to follow in 1 week with them.    Follow-up Information   Follow up with this clinic. Schedule an appointment as soon as possible for a visit in 4 weeks.  Leroy Sea M.D on 09/03/2012 at 10:31 AM    Leroy Sea, MD 09/03/12 1032

## 2012-09-03 NOTE — ED Notes (Signed)
[facility]

## 2012-09-05 ENCOUNTER — Ambulatory Visit (HOSPITAL_COMMUNITY)
Admission: RE | Admit: 2012-09-05 | Discharge: 2012-09-05 | Disposition: A | Discharge status: Home / Self Care | Payer: Self-pay | Source: Ambulatory Visit | Attending: Obstetrics & Gynecology | Admitting: Obstetrics & Gynecology

## 2012-09-05 DIAGNOSIS — N938 Other specified abnormal uterine and vaginal bleeding: Secondary | ICD-10-CM | POA: Insufficient documentation

## 2012-09-05 DIAGNOSIS — N949 Unspecified condition associated with female genital organs and menstrual cycle: Secondary | ICD-10-CM | POA: Insufficient documentation

## 2012-09-05 DIAGNOSIS — N925 Other specified irregular menstruation: Secondary | ICD-10-CM | POA: Insufficient documentation

## 2012-09-05 DIAGNOSIS — Z01812 Encounter for preprocedural laboratory examination: Secondary | ICD-10-CM

## 2012-09-05 DIAGNOSIS — D252 Subserosal leiomyoma of uterus: Secondary | ICD-10-CM | POA: Insufficient documentation

## 2012-09-05 DIAGNOSIS — D250 Submucous leiomyoma of uterus: Secondary | ICD-10-CM | POA: Insufficient documentation

## 2012-09-08 ENCOUNTER — Telehealth (HOSPITAL_COMMUNITY): Payer: Self-pay

## 2012-09-09 ENCOUNTER — Telehealth (HOSPITAL_COMMUNITY): Payer: Self-pay

## 2012-09-09 NOTE — ED Notes (Signed)
Patient returned  Call-lab results given To return in one month to repeat TSH

## 2012-09-18 ENCOUNTER — Ambulatory Visit (INDEPENDENT_AMBULATORY_CARE_PROVIDER_SITE_OTHER): Payer: Self-pay | Admitting: Obstetrics & Gynecology

## 2012-09-18 ENCOUNTER — Encounter: Payer: Self-pay | Admitting: Obstetrics & Gynecology

## 2012-09-18 VITALS — BP 120/77 | HR 59 | Temp 97.1°F | Ht 64.0 in | Wt 185.5 lb

## 2012-09-18 DIAGNOSIS — D259 Leiomyoma of uterus, unspecified: Secondary | ICD-10-CM

## 2012-09-18 DIAGNOSIS — D219 Benign neoplasm of connective and other soft tissue, unspecified: Secondary | ICD-10-CM

## 2012-09-18 NOTE — Progress Notes (Signed)
  Subjective:    Patient ID: Kristen Moore, female    DOB: 1970-07-01, 42 y.o.   MRN: [id number]  HPI  42 yo SW P1 (42yo) who has multiple uterine fibroids, menorrhagia, and anemia. Her EMBX results were normal. I have offered her depo provera, depo Lupron, Mirena, and/or TAH. After a thorough discussion of risks and benefits of each, she opts for a TAH. She is aware that she will be contacted by a Artist.   Review of Systems     Objective:   Physical Exam        Assessment & Plan:   Fibroids/menorrhagia- as above I will send Kristen Moore an email to schedule surgery. I have told her that she may have depo provera at any time prior to surgery.

## 2012-10-01 ENCOUNTER — Telehealth: Payer: Self-pay | Admitting: *Deleted

## 2012-10-01 NOTE — Telephone Encounter (Signed)
Pt left message requesting test results for anemia and also wants information about her surgery. I returned pt's call and discussed her concerns. I informed her that she was slightly anemic when tested last month. She may take OTC iron twice daily. She should include green leafy veggies in her daily diet and/or take Colace 1 tab daily and drink 6-8 glasses of water daily to prevent constipation. I also informed her that her surgery will most likely be scheduled in July and she will be notified once the appt has been made. She states she has completed the application for financial aid and is waiting for a response. 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 She would like the surgery and recovery time to completed by then if possible. I notified [name] Presnell of pt's request. I informed pt that from reviewing her chart, it looks as though she needs a follow up appt @ [facility] for her thyroid treatment. Pt stated that she was supposed to call them back for an appt and had forgotten. I advised her of the importance of this follow up as she does not want to have surgery if her thyroid is not well controlled. Pt voiced understanding of all information and advice given. She states she will call to schedule her appt @ [facility].

## 2012-10-02 ENCOUNTER — Telehealth (HOSPITAL_COMMUNITY): Payer: Self-pay | Admitting: *Deleted

## 2012-10-02 NOTE — Telephone Encounter (Signed)
Patient called me in regards to if [facility] covered a hysterectomy for fibroids. Let patient know that [facility] does not cover a hysterectomy for benign reasons. Let her know that she will need to complete financial assistance paperwork. Patient stated she thinks she filled that out. Let her know she can follow up with patient accounting on questions in regards to her financial assistance. Patient verbalized understanding.

## 2012-10-10 ENCOUNTER — Encounter (HOSPITAL_COMMUNITY): Payer: Self-pay | Admitting: *Deleted

## 2012-10-10 ENCOUNTER — Emergency Department (HOSPITAL_COMMUNITY)
Admission: EM | Admit: 2012-10-10 | Discharge: 2012-10-10 | Disposition: A | Discharge status: Home Health | Payer: Self-pay | Source: Home / Self Care

## 2012-10-10 DIAGNOSIS — R7989 Other specified abnormal findings of blood chemistry: Secondary | ICD-10-CM

## 2012-10-10 NOTE — ED Provider Notes (Signed)
History     CSN: [id number]  Arrival date & time 10/10/12  1501   First MD Initiated Contact with Patient 10/10/12 1511      No chief complaint on file.   (Consider location/radiation/quality/duration/timing/severity/associated sxs/prior treatment) HPI 42 year old female with past medical history of thyroid dysfunction, bipolar disorder, fibroids presents to clinic for follow up. Patient had TSH drawn 09/03/2012 and the result was 4.725. Patient feels fine. No complaints of chest pain, shortness of breath. No complaints of fever or chills. No complaints of weight loss or weight gain. No complaints of heat or cold intolerance. No proptosis. No complaints of abdominal pain, nausea or vomiting. No blurry vision or lightheadedness or loss of consciousness.  Past Medical History  Diagnosis Date  . Bipolar 1 disorder   . IBS (irritable bowel syndrome)   . Thyroid dysfunction   . Abnormal Pap smear     cryo  . Fibroids   . DUB (dysfunctional uterine bleeding)   . Cyst of breast     left breast    Past Surgical History  Procedure Laterality Date  . Cholecystectomy    . Tubal ligation    . Colon surgery    . Dilation and curettage of uterus      Family History  Problem Relation Age of Onset  . Breast cancer Maternal Grandmother   . Heart disease Father   . Hypertension Mother   . Breast cancer Paternal Aunt   . Breast cancer Maternal Aunt   . Rheum arthritis Mother   . Fibromyalgia Mother   . COPD Mother   . Sleep apnea Mother     History  Substance Use Topics  . Smoking status: Current Every Day Smoker -- 1.00 packs/day for 9 years    Types: Cigarettes  . Smokeless tobacco: Never Used  . Alcohol Use: No    OB History   Grav Para Term Preterm Abortions TAB SAB Ect Mult Living   3 2 1 1 1 1    1       Review of Systems  Constitutional: Negative for fever, chills, diaphoresis, activity change, appetite change and fatigue.  HENT: Negative for ear pain, nosebleeds,  congestion, facial swelling, rhinorrhea, neck pain, neck stiffness and ear discharge.   Eyes: Negative for pain, discharge, redness, itching and visual disturbance.  Respiratory: Negative for cough, choking, chest tightness, shortness of breath, wheezing and stridor.   Cardiovascular: Negative for chest pain, palpitations and leg swelling.  Gastrointestinal: Negative for abdominal distention.  Genitourinary: Negative for dysuria, urgency, frequency, hematuria, flank pain, decreased urine volume, difficulty urinating and dyspareunia.  Musculoskeletal: Negative for back pain, joint swelling, arthralgias and gait problem.  Neurological: Negative for dizziness, tremors, seizures, syncope, facial asymmetry, speech difficulty, weakness, light-headedness, numbness and headaches.  Hematological: Negative for adenopathy. Does not bruise/bleed easily.  Psychiatric/Behavioral: Negative for hallucinations, behavioral problems, confusion, dysphoric mood, decreased concentration and agitation.    Allergies  Latex; Penicillins; and Other  Home Medications   Current Outpatient Rx  Name  Route  Sig  Dispense  Refill  . albuterol (PROVENTIL HFA;VENTOLIN HFA) 108 (90 BASE) MCG/ACT inhaler   Inhalation   Inhale 1-2 puffs into the lungs every 6 (six) hours as needed for wheezing.   1 Inhaler   0   . levothyroxine (SYNTHROID, LEVOTHROID) 150 MCG tablet   Oral   Take 1 tablet (150 mcg total) by mouth daily.   30 tablet   1   . lithium carbonate 300 MG capsule  Oral   Take 300 mg by mouth 2 (two) times daily with a meal.             BP 117/86  Pulse 58  Temp(Src) 97.9 F (36.6 C) (Oral)  Resp 18  SpO2 100%  Physical Exam  Constitutional: Appears well-developed and well-nourished. No distress.  HENT: Normocephalic. External right and left ear normal. Oropharynx is clear and moist.  Eyes: Conjunctivae and EOM are normal. PERRLA, no scleral icterus.  Neck: Normal ROM. Neck supple. No JVD. No  tracheal deviation. No thyromegaly.  CVS: RRR, S1/S2 +, no murmurs, no gallops, no carotid bruit.  Pulmonary: Effort and breath sounds normal, no stridor, rhonchi, wheezes, rales.  Abdominal: Soft. BS +,  no distension, tenderness, rebound or guarding.  Musculoskeletal: Normal range of motion. No edema and no tenderness.  Lymphadenopathy: No lymphadenopathy noted, cervical, inguinal. Neuro: Alert. Normal reflexes, muscle tone coordination. No cranial nerve deficit. Skin: Skin is warm and dry. No rash noted. Not diaphoretic. No erythema. No pallor.  Psychiatric: Normal mood and affect. Behavior, judgment, thought content normal.    ED Course  Procedures (including critical care time)  Labs Reviewed - No data to display No results found.   1. Elevated TSH   - patient was on thyroid hormone but ran out of the medication and did not have money to buy it. Her TSH 4/2 2014 was 4.725 (slightly out of normal range. - Patient is planning to have fibroid removal sometimes in near future and was told by surgery her thyroid hormone should be within normal limits. Patient was instructed to start taking her Synthroid 150 mcg daily and to come back about 2 weeks after the surgery to allow stress hormones to equilibriate before we obtain another TSH  MDM  Hypothyroidism        Alison Murray, MD 10/10/12 1524

## 2012-10-10 NOTE — ED Notes (Signed)
Follow up with thyroid.

## 2012-10-16 ENCOUNTER — Encounter: Payer: Self-pay | Admitting: *Deleted

## 2012-10-20 ENCOUNTER — Encounter: Payer: Self-pay | Admitting: Obstetrics & Gynecology

## 2012-11-14 NOTE — Progress Notes (Signed)
Quick Note:  Please hava patient come back for repeat TSH ______

## 2012-11-17 ENCOUNTER — Telehealth: Payer: Self-pay | Admitting: *Deleted

## 2012-11-17 NOTE — Telephone Encounter (Signed)
11/17/12 Patient made aware to come in for repeat TSH level per Dr.Singh P.Kristen Belcher,RN

## 2012-11-19 ENCOUNTER — Ambulatory Visit: Payer: Self-pay | Attending: Family Medicine

## 2012-11-19 DIAGNOSIS — R7989 Other specified abnormal findings of blood chemistry: Secondary | ICD-10-CM

## 2012-11-19 LAB — TSH: TSH: 0.574 u[IU]/mL (ref 0.350–4.500)

## 2012-12-03 ENCOUNTER — Telehealth: Payer: Self-pay | Admitting: Family Medicine

## 2012-12-03 NOTE — Telephone Encounter (Signed)
Pt calling about tyroid lab results for 6/18 visit. Please f/u with pt.

## 2012-12-18 ENCOUNTER — Encounter (HOSPITAL_COMMUNITY): Payer: Self-pay | Admitting: Pharmacy Technician

## 2012-12-23 ENCOUNTER — Other Ambulatory Visit (HOSPITAL_COMMUNITY): Payer: Self-pay

## 2012-12-24 ENCOUNTER — Encounter (HOSPITAL_COMMUNITY): Payer: Self-pay

## 2012-12-24 ENCOUNTER — Encounter (HOSPITAL_COMMUNITY)
Admission: RE | Admit: 2012-12-24 | Discharge: 2012-12-24 | Disposition: A | Discharge status: Home / Self Care | Payer: Self-pay | Source: Ambulatory Visit | Attending: Obstetrics & Gynecology | Admitting: Obstetrics & Gynecology

## 2012-12-24 ENCOUNTER — Inpatient Hospital Stay (HOSPITAL_COMMUNITY)
Admission: RE | Admit: 2012-12-24 | Discharge: 2012-12-27 | DRG: 743 | Disposition: A | Discharge status: Home / Self Care | Payer: Self-pay | Source: Ambulatory Visit | Attending: Obstetrics & Gynecology | Admitting: Obstetrics & Gynecology

## 2012-12-24 DIAGNOSIS — D649 Anemia, unspecified: Secondary | ICD-10-CM

## 2012-12-24 DIAGNOSIS — D251 Intramural leiomyoma of uterus: Secondary | ICD-10-CM | POA: Diagnosis present

## 2012-12-24 DIAGNOSIS — D252 Subserosal leiomyoma of uterus: Principal | ICD-10-CM | POA: Diagnosis present

## 2012-12-24 DIAGNOSIS — N838 Other noninflammatory disorders of ovary, fallopian tube and broad ligament: Secondary | ICD-10-CM | POA: Diagnosis present

## 2012-12-24 DIAGNOSIS — D250 Submucous leiomyoma of uterus: Secondary | ICD-10-CM | POA: Diagnosis present

## 2012-12-24 DIAGNOSIS — D500 Iron deficiency anemia secondary to blood loss (chronic): Secondary | ICD-10-CM | POA: Diagnosis present

## 2012-12-24 DIAGNOSIS — R102 Pelvic and perineal pain: Secondary | ICD-10-CM

## 2012-12-24 DIAGNOSIS — N949 Unspecified condition associated with female genital organs and menstrual cycle: Secondary | ICD-10-CM | POA: Diagnosis present

## 2012-12-24 DIAGNOSIS — N840 Polyp of corpus uteri: Secondary | ICD-10-CM | POA: Diagnosis present

## 2012-12-24 DIAGNOSIS — N920 Excessive and frequent menstruation with regular cycle: Secondary | ICD-10-CM | POA: Diagnosis present

## 2012-12-24 DIAGNOSIS — N8300 Follicular cyst of ovary, unspecified side: Secondary | ICD-10-CM | POA: Diagnosis present

## 2012-12-24 DIAGNOSIS — N9489 Other specified conditions associated with female genital organs and menstrual cycle: Secondary | ICD-10-CM | POA: Diagnosis present

## 2012-12-24 DIAGNOSIS — IMO0002 Reserved for concepts with insufficient information to code with codable children: Secondary | ICD-10-CM | POA: Diagnosis present

## 2012-12-24 HISTORY — DX: Headache: R51

## 2012-12-24 HISTORY — DX: Unspecified asthma, uncomplicated: J45.909

## 2012-12-24 HISTORY — DX: Anemia, unspecified: D64.9

## 2012-12-24 HISTORY — DX: Hypothyroidism, unspecified: E03.9

## 2012-12-24 LAB — CBC
HCT: 32.6 % — ABNORMAL LOW (ref 36.0–46.0)
Hemoglobin: 10.3 g/dL — ABNORMAL LOW (ref 12.0–15.0)
MCH: 25.5 pg — ABNORMAL LOW (ref 26.0–34.0)
MCHC: 31.6 g/dL (ref 30.0–36.0)
MCV: 80.7 fL (ref 78.0–100.0)
Platelets: 224 10*3/uL (ref 150–400)
RBC: 4.04 MIL/uL (ref 3.87–5.11)
RDW: 15.4 % (ref 11.5–15.5)
WBC: 6.4 10*3/uL (ref 4.0–10.5)

## 2012-12-24 LAB — SURGICAL PCR SCREEN
MRSA, PCR: NEGATIVE
Staphylococcus aureus: NEGATIVE

## 2012-12-24 NOTE — Patient Instructions (Addendum)
Your procedure is scheduled on:12/25/12  Enter through the Main Entrance at :0830 am Pick up desk phone and dial [phone] and inform us of your arrival.  Please call [phone] if you have any problems the morning of surgery.  Remember: Do not eat food or drink liquids, including water, after midnight:tonight   You may brush your teeth the morning of surgery.  Take these meds the morning of surgery with a sip of water:Lithium,Synthroid- bring inhaler to hospital  DO NOT wear jewelry, eye make-up, lipstick,body lotion, or dark fingernail polish.  (Polished toes are ok) You may wear deodorant.  If you are to be admitted after surgery, leave suitcase in car until your room has been assigned. Patients discharged on the day of surgery will not be allowed to drive home. Wear loose fitting, comfortable clothes for your ride home.

## 2012-12-25 ENCOUNTER — Encounter (HOSPITAL_COMMUNITY): Payer: Self-pay | Admitting: Anesthesiology

## 2012-12-25 ENCOUNTER — Encounter (HOSPITAL_COMMUNITY): Payer: Self-pay | Admitting: *Deleted

## 2012-12-25 ENCOUNTER — Inpatient Hospital Stay (HOSPITAL_COMMUNITY): Payer: Self-pay | Admitting: Anesthesiology

## 2012-12-25 ENCOUNTER — Encounter (HOSPITAL_COMMUNITY)
Admission: RE | Disposition: A | Discharge status: Home / Self Care | Payer: Self-pay | Source: Ambulatory Visit | Attending: Obstetrics & Gynecology

## 2012-12-25 DIAGNOSIS — D649 Anemia, unspecified: Secondary | ICD-10-CM

## 2012-12-25 DIAGNOSIS — R102 Pelvic and perineal pain: Secondary | ICD-10-CM

## 2012-12-25 DIAGNOSIS — N920 Excessive and frequent menstruation with regular cycle: Secondary | ICD-10-CM

## 2012-12-25 DIAGNOSIS — D250 Submucous leiomyoma of uterus: Secondary | ICD-10-CM

## 2012-12-25 DIAGNOSIS — D252 Subserosal leiomyoma of uterus: Principal | ICD-10-CM

## 2012-12-25 DIAGNOSIS — N840 Polyp of corpus uteri: Secondary | ICD-10-CM

## 2012-12-25 DIAGNOSIS — IMO0002 Reserved for concepts with insufficient information to code with codable children: Secondary | ICD-10-CM

## 2012-12-25 DIAGNOSIS — D251 Intramural leiomyoma of uterus: Secondary | ICD-10-CM

## 2012-12-25 HISTORY — PX: OOPHORECTOMY: SHX6387

## 2012-12-25 HISTORY — PX: ABDOMINAL HYSTERECTOMY: SHX81

## 2012-12-25 HISTORY — PX: BILATERAL SALPINGECTOMY: SHX5743

## 2012-12-25 LAB — PREGNANCY, URINE: Preg Test, Ur: NEGATIVE

## 2012-12-25 SURGERY — HYSTERECTOMY, ABDOMINAL
Anesthesia: General | Site: Abdomen | Laterality: Bilateral | Wound class: Clean Contaminated

## 2012-12-25 MED ORDER — LITHIUM CARBONATE 300 MG PO CAPS
600.0000 mg | ORAL_CAPSULE | Freq: Every day | ORAL | Status: DC
  Administered 2012-12-25 – 2012-12-26 (×2): 600 mg via ORAL
  Filled 2012-12-25 (×3): qty 2

## 2012-12-25 MED ORDER — KETOROLAC TROMETHAMINE 30 MG/ML IJ SOLN: 15.0000 mg | Freq: Once | INTRAMUSCULAR | Status: AC | PRN

## 2012-12-25 MED ORDER — MIDAZOLAM HCL 2 MG/2ML IJ SOLN
INTRAMUSCULAR | Status: AC
  Filled 2012-12-25: qty 2

## 2012-12-25 MED ORDER — IBUPROFEN 800 MG PO TABS
800.0000 mg | ORAL_TABLET | Freq: Three times a day (TID) | ORAL | Status: DC | PRN
  Administered 2012-12-26 – 2012-12-27 (×3): 800 mg via ORAL
  Filled 2012-12-25 (×3): qty 1

## 2012-12-25 MED ORDER — LIDOCAINE HCL (CARDIAC) 20 MG/ML IV SOLN
INTRAVENOUS | Status: DC | PRN
  Administered 2012-12-25: 100 mg via INTRAVENOUS

## 2012-12-25 MED ORDER — GLYCOPYRROLATE 0.2 MG/ML IJ SOLN
INTRAMUSCULAR | Status: DC | PRN
  Administered 2012-12-25: .7 mg via INTRAVENOUS

## 2012-12-25 MED ORDER — LIDOCAINE HCL (CARDIAC) 20 MG/ML IV SOLN
INTRAVENOUS | Status: AC
  Filled 2012-12-25: qty 5

## 2012-12-25 MED ORDER — HYDROMORPHONE HCL PF 1 MG/ML IJ SOLN: 0.2500 mg | INTRAMUSCULAR | Status: DC | PRN

## 2012-12-25 MED ORDER — FENTANYL CITRATE 0.05 MG/ML IJ SOLN
INTRAMUSCULAR | Status: AC
  Filled 2012-12-25: qty 5

## 2012-12-25 MED ORDER — BUPIVACAINE HCL (PF) 0.5 % IJ SOLN
INTRAMUSCULAR | Status: DC | PRN
  Administered 2012-12-25: 30 mL

## 2012-12-25 MED ORDER — HYDROMORPHONE HCL PF 1 MG/ML IJ SOLN
INTRAMUSCULAR | Status: AC
  Administered 2012-12-25: 0.5 mg via INTRAVENOUS
  Filled 2012-12-25: qty 1

## 2012-12-25 MED ORDER — LACTATED RINGERS IV SOLN
INTRAVENOUS | Status: DC
  Administered 2012-12-25 (×3): via INTRAVENOUS

## 2012-12-25 MED ORDER — KETOROLAC TROMETHAMINE 30 MG/ML IJ SOLN
INTRAMUSCULAR | Status: AC
  Administered 2012-12-25: 30 mg via INTRAVENOUS
  Filled 2012-12-25: qty 1

## 2012-12-25 MED ORDER — NEOSTIGMINE METHYLSULFATE 1 MG/ML IJ SOLN
INTRAMUSCULAR | Status: DC | PRN
  Administered 2012-12-25: 4 mg via INTRAVENOUS

## 2012-12-25 MED ORDER — DEXAMETHASONE SODIUM PHOSPHATE 10 MG/ML IJ SOLN
INTRAMUSCULAR | Status: AC
  Filled 2012-12-25: qty 1

## 2012-12-25 MED ORDER — ONDANSETRON HCL 4 MG/2ML IJ SOLN
INTRAMUSCULAR | Status: AC
  Filled 2012-12-25: qty 2

## 2012-12-25 MED ORDER — GLYCOPYRROLATE 0.2 MG/ML IJ SOLN
INTRAMUSCULAR | Status: AC
  Filled 2012-12-25: qty 3

## 2012-12-25 MED ORDER — PROPOFOL 10 MG/ML IV BOLUS
INTRAVENOUS | Status: DC | PRN
  Administered 2012-12-25: 170 mg via INTRAVENOUS

## 2012-12-25 MED ORDER — PNEUMOCOCCAL VAC POLYVALENT 25 MCG/0.5ML IJ INJ
0.5000 mL | INJECTION | INTRAMUSCULAR | Status: AC
  Filled 2012-12-25: qty 0.5

## 2012-12-25 MED ORDER — ONDANSETRON HCL 4 MG/2ML IJ SOLN
INTRAMUSCULAR | Status: DC | PRN
  Administered 2012-12-25: 4 mg via INTRAVENOUS

## 2012-12-25 MED ORDER — PROMETHAZINE HCL 25 MG/ML IJ SOLN: 6.2500 mg | INTRAMUSCULAR | Status: DC | PRN

## 2012-12-25 MED ORDER — BUPIVACAINE HCL (PF) 0.5 % IJ SOLN
INTRAMUSCULAR | Status: AC
  Filled 2012-12-25: qty 30

## 2012-12-25 MED ORDER — LACTATED RINGERS IV SOLN
INTRAVENOUS | Status: DC
  Administered 2012-12-25 – 2012-12-26 (×2): via INTRAVENOUS

## 2012-12-25 MED ORDER — CEFAZOLIN SODIUM-DEXTROSE 2-3 GM-% IV SOLR
INTRAVENOUS | Status: AC
  Administered 2012-12-25: 2 g via INTRAVENOUS
  Filled 2012-12-25: qty 50

## 2012-12-25 MED ORDER — OXYCODONE-ACETAMINOPHEN 5-325 MG PO TABS
1.0000 | ORAL_TABLET | ORAL | Status: DC | PRN
  Administered 2012-12-25 (×2): 2 via ORAL
  Administered 2012-12-26: 1 via ORAL
  Administered 2012-12-26 – 2012-12-27 (×4): 2 via ORAL
  Filled 2012-12-25: qty 1
  Filled 2012-12-25 (×6): qty 2

## 2012-12-25 MED ORDER — ONDANSETRON HCL 4 MG PO TABS: 4.0000 mg | ORAL_TABLET | Freq: Four times a day (QID) | ORAL | Status: DC | PRN

## 2012-12-25 MED ORDER — NEOSTIGMINE METHYLSULFATE 1 MG/ML IJ SOLN
INTRAMUSCULAR | Status: AC
  Filled 2012-12-25: qty 1

## 2012-12-25 MED ORDER — DEXAMETHASONE SODIUM PHOSPHATE 10 MG/ML IJ SOLN
INTRAMUSCULAR | Status: DC | PRN
  Administered 2012-12-25 (×2): 10 mg via INTRAVENOUS

## 2012-12-25 MED ORDER — LEVOTHYROXINE SODIUM 150 MCG PO TABS
150.0000 ug | ORAL_TABLET | Freq: Every day | ORAL | Status: DC
  Administered 2012-12-26 – 2012-12-27 (×2): 150 ug via ORAL
  Filled 2012-12-25 (×3): qty 1

## 2012-12-25 MED ORDER — CEFAZOLIN SODIUM-DEXTROSE 2-3 GM-% IV SOLR: 2.0000 g | INTRAVENOUS | Status: DC

## 2012-12-25 MED ORDER — PROPOFOL 10 MG/ML IV EMUL
INTRAVENOUS | Status: AC
  Filled 2012-12-25: qty 20

## 2012-12-25 MED ORDER — LITHIUM CARBONATE ER 300 MG PO TBCR: 300.0000 mg | EXTENDED_RELEASE_TABLET | Freq: Every morning | ORAL | Status: DC

## 2012-12-25 MED ORDER — MEPERIDINE HCL 25 MG/ML IJ SOLN: 6.2500 mg | INTRAMUSCULAR | Status: DC | PRN

## 2012-12-25 MED ORDER — MIDAZOLAM HCL 5 MG/5ML IJ SOLN
INTRAMUSCULAR | Status: DC | PRN
  Administered 2012-12-25: 2 mg via INTRAVENOUS

## 2012-12-25 MED ORDER — FENTANYL CITRATE 0.05 MG/ML IJ SOLN
INTRAMUSCULAR | Status: DC | PRN
  Administered 2012-12-25 (×7): 50 ug via INTRAVENOUS

## 2012-12-25 MED ORDER — LITHIUM CARBONATE ER 300 MG PO TBCR: 600.0000 mg | EXTENDED_RELEASE_TABLET | Freq: Every day | ORAL | Status: DC

## 2012-12-25 MED ORDER — ROCURONIUM BROMIDE 50 MG/5ML IV SOLN
INTRAVENOUS | Status: AC
  Filled 2012-12-25: qty 1

## 2012-12-25 MED ORDER — LITHIUM CARBONATE 300 MG PO CAPS
300.0000 mg | ORAL_CAPSULE | Freq: Every day | ORAL | Status: DC
  Administered 2012-12-26 – 2012-12-27 (×2): 300 mg via ORAL
  Filled 2012-12-25 (×3): qty 1

## 2012-12-25 MED ORDER — EPHEDRINE 5 MG/ML INJ
INTRAVENOUS | Status: AC
  Filled 2012-12-25: qty 10

## 2012-12-25 MED ORDER — ONDANSETRON HCL 4 MG/2ML IJ SOLN: 4.0000 mg | Freq: Four times a day (QID) | INTRAMUSCULAR | Status: DC | PRN

## 2012-12-25 MED ORDER — ACETAMINOPHEN 10 MG/ML IV SOLN
1000.0000 mg | Freq: Four times a day (QID) | INTRAVENOUS | Status: DC
  Administered 2012-12-25: 1000 mg via INTRAVENOUS
  Filled 2012-12-25 (×4): qty 100

## 2012-12-25 SURGICAL SUPPLY — 28 items
CANISTER SUCTION 2500CC (MISCELLANEOUS) ×4 IMPLANT
CHLORAPREP W/TINT 26ML (MISCELLANEOUS) ×2 IMPLANT
CLOTH BEACON ORANGE TIMEOUT ST (SAFETY) ×2 IMPLANT
CONT PATH 16OZ SNAP LID 3702 (MISCELLANEOUS) ×2 IMPLANT
DECANTER SPIKE VIAL GLASS SM (MISCELLANEOUS) ×2 IMPLANT
DRSG OPSITE POSTOP 4X10 (GAUZE/BANDAGES/DRESSINGS) ×2 IMPLANT
GAUZE SPONGE 4X4 16PLY XRAY LF (GAUZE/BANDAGES/DRESSINGS) ×2 IMPLANT
GLOVE BIOGEL PI IND STRL 6 (GLOVE) ×6 IMPLANT
GLOVE BIOGEL PI INDICATOR 6 (GLOVE) ×6
GLOVE SURG SS PI 6.0 STRL IVOR (GLOVE) ×12 IMPLANT
GLOVE SURG SS PI 6.5 STRL IVOR (GLOVE) ×8 IMPLANT
GLOVE SURG SS PI 7.0 STRL IVOR (GLOVE) ×8 IMPLANT
GLOVE SURG SS PI 7.5 STRL IVOR (GLOVE) ×6 IMPLANT
GOWN PREVENTION PLUS LG XLONG (DISPOSABLE) ×6 IMPLANT
GOWN STRL REIN XL XLG (GOWN DISPOSABLE) ×2 IMPLANT
NS IRRIG 1000ML POUR BTL (IV SOLUTION) ×4 IMPLANT
PACK ABDOMINAL GYN (CUSTOM PROCEDURE TRAY) ×2 IMPLANT
PAD OB MATERNITY 4.3X12.25 (PERSONAL CARE ITEMS) ×2 IMPLANT
PROTECTOR NERVE ULNAR (MISCELLANEOUS) ×2 IMPLANT
SPONGE LAP 18X18 X RAY DECT (DISPOSABLE) ×6 IMPLANT
SUT VIC AB 0 CT1 36 (SUTURE) ×6 IMPLANT
SUT VIC AB 2-0 CT1 18 (SUTURE) ×6 IMPLANT
SUT VIC AB 3-0 CT1 27 (SUTURE) ×1
SUT VIC AB 3-0 CT1 TAPERPNT 27 (SUTURE) ×1 IMPLANT
SYR 30ML LL (SYRINGE) ×2 IMPLANT
TOWEL OR 17X24 6PK STRL BLUE (TOWEL DISPOSABLE) ×6 IMPLANT
TRAY FOLEY BAG SILVER LF 14FR (CATHETERS) ×2 IMPLANT
WATER STERILE IRR 1000ML POUR (IV SOLUTION) ×2 IMPLANT

## 2012-12-25 NOTE — Anesthesia Postprocedure Evaluation (Signed)
  Anesthesia Post-op Note  Anesthesia Post Note  Patient: Kristen Moore  Procedure(s) Performed: Procedure(s) (LRB): HYSTERECTOMY ABDOMINAL (Bilateral) BILATERAL SALPINGECTOMY (Bilateral) OOPHORECTOMY (Bilateral)  Anesthesia type: General  Patient location: Women's Unit  Post pain: Pain level controlled  Post assessment: Post-op Vital signs reviewed  Last Vitals:  Filed Vitals:   12/25/12 1340  BP: 107/79  Pulse: 65  Temp: 36.4 C  Resp: 16    Post vital signs: Reviewed  Level of consciousness: sedated  Complications: No apparent anesthesia complications

## 2012-12-25 NOTE — Transfer of Care (Signed)
Immediate Anesthesia Transfer of Care Note  Patient: NINEL ABDELLA  Procedure(s) Performed: Procedure(s) with comments: HYSTERECTOMY ABDOMINAL (Bilateral) - TAH and bilateral salpingectomy BILATERAL SALPINGECTOMY (Bilateral) OOPHORECTOMY (Bilateral)  Patient Location: PACU  Anesthesia Type:General  Level of Consciousness: awake, alert  and oriented  Airway & Oxygen Therapy: Patient Spontanous Breathing and Patient connected to nasal cannula oxygen  Post-op Assessment: Report given to PACU RN and Post -op Vital signs reviewed and stable  Post vital signs: Reviewed and stable  Complications: No apparent anesthesia complications

## 2012-12-25 NOTE — Anesthesia Postprocedure Evaluation (Signed)
  Anesthesia Post-op Note  Patient: Kristen Moore  Procedure(s) Performed: Procedure(s) with comments: HYSTERECTOMY ABDOMINAL (Bilateral) - TAH and bilateral salpingectomy BILATERAL SALPINGECTOMY (Bilateral) OOPHORECTOMY (Bilateral)  Patient Location: PACU  Anesthesia Type:General  Level of Consciousness: awake, alert  and oriented  Airway and Oxygen Therapy: Patient Spontanous Breathing  Post-op Pain: mild  Post-op Assessment: Post-op Vital signs reviewed, Patient's Cardiovascular Status Stable, Respiratory Function Stable, Patent Airway, No signs of Nausea or vomiting and Pain level controlled  Post-op Vital Signs: Reviewed and stable  Complications: No apparent anesthesia complications

## 2012-12-25 NOTE — H&P (Signed)
ALFONSO SHACKETT is an 42 y.o. female. 42 yo SW P1 (21yo daughter) who has multiple uterine fibroids, menorrhagia, and anemia. Her EMBX results were normal. I have offered her depo provera, depo Lupron, or Mirena. She wants a hysterectomy. She also has pelvic pain, dyspareunia. She has been abstinent since 2008.   Pertinent Gynecological History: Menses: flow is excessive with use of 16 pads or tampons on heaviest days Bleeding: some IMB Contraception: abstinence DES exposure: denies Blood transfusions: none Sexually transmitted diseases: syphillis in about 2003, CT Previous GYN Procedures: [facility] 2006 Last mammogram: normal Date: 2013 Last pap: normal Date: 2013 OB History: 3, P2 A 1 (LC 1)  Menstrual History: Menarche age: 69 Patient's last menstrual period was 11/29/2012.    Past Medical History  Diagnosis Date  . Bipolar 1 disorder   . IBS (irritable bowel syndrome)   . Thyroid dysfunction   . Abnormal Pap smear     cryo  . Fibroids   . DUB (dysfunctional uterine bleeding)   . Cyst of breast     left breast  . Hypothyroidism   . Anemia   . Asthma   . RUEAVWUJ(811.9)     Past Surgical History  Procedure Laterality Date  . Cholecystectomy    . Tubal ligation    . Colon surgery    . Dilation and curettage of uterus      Family History  Problem Relation Age of Onset  . Breast cancer Maternal Grandmother   . Heart disease Father   . Hypertension Mother   . Breast cancer Paternal Aunt   . Breast cancer Maternal Aunt   . Rheum arthritis Mother   . Fibromyalgia Mother   . COPD Mother   . Sleep apnea Mother     Social History:  reports that she has been smoking Cigarettes.  She has a 9 pack-year smoking history. She has never used smokeless tobacco. She reports that she does not drink alcohol or use illicit drugs.  Allergies:  Allergies  Allergen Reactions  . Latex Swelling  . Penicillins Swelling  . Other Hives and Swelling    Potatoes        Prescriptions prior to admission  Medication Sig Dispense Refill  . albuterol (PROVENTIL HFA;VENTOLIN HFA) 108 (90 BASE) MCG/ACT inhaler Inhale 1-2 puffs into the lungs every 6 (six) hours as needed for wheezing.  1 Inhaler  0  . Calcium Carbonate-Vitamin D (CALCIUM 600/VITAMIN D PO) Take 1 tablet by mouth daily.      Marland Kitchen levothyroxine (SYNTHROID, LEVOTHROID) 150 MCG tablet Take 1 tablet (150 mcg total) by mouth daily.  30 tablet  1  . lithium carbonate 300 MG capsule Take 300-600 mg by mouth 2 (two) times daily with a meal. Takes 300 mg in the morning and 600 mg at night.      . Omega-3 Fatty Acids (FISH OIL) 1200 MG CAPS Take 1 capsule by mouth daily.      Marland Kitchen OVER THE COUNTER MEDICATION Take 3 tablets by mouth 2 (two) times daily as needed (for pain). [facility] brand acetaminophen        ROS  Blood pressure 105/69, pulse 62, temperature 98.2 F (36.8 C), temperature source Oral, resp. rate 20, last menstrual period 11/29/2012, SpO2 99.00%. Physical Exam  heart- rrr Lungs- CTAB Abd- benign Results for orders placed during the hospital encounter of 12/24/12 (from the past 24 hour(s))  CBC     Status: Abnormal   Collection Time  12/24/12  1:40 PM      Result Value Range   WBC 6.4  4.0 - 10.5 K/uL   RBC 4.04  3.87 - 5.11 MIL/uL   Hemoglobin 10.3 (*) 12.0 - 15.0 g/dL   HCT 96.2 (*) 95.2 - 84.1 %   MCV 80.7  78.0 - 100.0 fL   MCH 25.5 (*) 26.0 - 34.0 pg   MCHC 31.6  30.0 - 36.0 g/dL   RDW 32.4  40.1 - 02.7 %   Platelets 224  150 - 400 K/uL  SURGICAL PCR SCREEN     Status: None   Collection Time    12/24/12  1:40 PM      Result Value Range   MRSA, PCR NEGATIVE  NEGATIVE   Staphylococcus aureus NEGATIVE  NEGATIVE    No results found.  Assessment/Plan: Menorrhagia, anemia, pelvic pain. She wants to proceed with a TAH, BSO. She understands that she will be menopausal and may or may not want ERT.  She understands the risks of surgery, including, but not to infection, bleeding,  DVTs, damage to bowel, bladder, ureters. She wishes to proceed.    Breionna Punt C. 12/25/2012, 9:45 AM

## 2012-12-25 NOTE — Op Note (Signed)
12/25/2012  11:33 AM  PATIENT:  Kristen Moore  42 y.o. female  PRE-OPERATIVE DIAGNOSIS:   Multiple uterine fibroids, menorrhagia and anemia, pelvic pain  POST-OPERATIVE DIAGNOSIS:   Same + endometriosis  PROCEDURE:  Procedure(s) with comments: HYSTERECTOMY ABDOMINAL (Bilateral) - TAH and bilateral salpingectomy BILATERAL SALPINGECTOMY (Bilateral)  SURGEON:  Surgeon(s) and Role:    * Allie Bossier, MD - Primary   PHYSICIAN ASSISTANT:   ASSISTANTS: Peggy Constant, MD   ANESTHESIA:   general  EBL:  Total I/O In: 1000 [I.V.:1000] Out: 400 [Urine:100; Blood:300]  BLOOD ADMINISTERED:none  DRAINS: none   LOCAL MEDICATIONS USED:  MARCAINE     SPECIMEN:  Source of Specimen:  uterus, tubes, ovaries  DISPOSITION OF SPECIMEN:  PATHOLOGY  COUNTS:  YES  TOURNIQUET:  * No tourniquets in log *  DICTATION: .Dragon Dictation  PLAN OF CARE: Admit to inpatient   PATIENT DISPOSITION:  PACU - hemodynamically stable.   Delay start of Pharmacological VTE agent (>24hrs) due to surgical blood loss or risk of bleeding: yes         The risks, benefits, and alternatives of surgery were explained, understood, and accepted. Consents were signed. All questions were answered. She was taken to the operating room and genera; anesthesia was applied without complication. Her abdomen and vagina were prepped and draped in the usual sterile fashion. A Foley catheter was placed which drained clear urine throughout the case. I injected 30 mL of 0.5% marcaine into the subQ tissue approximately 2 cm above the symphysis pubis. A transverse incision was made at this site.. The incision was carried down through the subcutaneous tissue to the fascia. Bleeding encountered was cauterized with the Bovie. The fascia was scored the midline and the fascial incision was extended bilaterally. The pyramidalis muscles were separated in a transverse fashion using electrosurgical technique. Approximately 2 cm of the  rectus muscles were separated in a transverse fashion in the midline using electrosurgical technique. Hemostasis was maintained. The peritoneum was entered with hemostats and the peritoneal incision was extended bilaterally with the Bovie, taking care to avoid bowel and bladder. The patient was placed in Trendelenburg position and her bowel was packed out of the abdominal cavity. An O'conner O- Sullivan self-retaining retractor was placed. The pelvis was inspected. There was endometriosis noted over the left ureter low in the pelvis. The position of the ureters' positions were noted throughout the case. Coker clamps were used to elevate the uterus. The round ligaments were identified clamped cut and ligated. A bladder flap was created anteriorly and the bladder was pushed out of the operative site with a moist lap sponge. The infundibulopelvic ligaments were identified bilaterally. They were clamped, cut, anddoubly ligated. Excellent hemostasis was noted. 2-0 Vicryl sutures used throughout this case unless otherwise specified. The uterine vessels were skeletonized, clamped, cut, and doubly ligated. The remainder of the cervix was separated from its pelvic attachments using the same clamp, cut, ligate technique. Curved Heaney clamps were used to clamp beneath the cervix. The cervix and uterus were removed and sent to pathology. The vaginal cuff was noted to be hemostatic. The pelvis was irrigated with 1 L of warm normal saline. All pedicles were noted to be hemostatic. The ureters were noted to be functioning and of normal caliber. The sponges and self-retaining Lenox Ahr retractor were removed. The rectus muscles were inspected and hemostasis was assured. The fascia was closed with a 0 Vicryl running nonlocking suture. The subcutaneous tissue was irrigated, clean, dry. It  was then infiltrated with 30 mL of 0.5% Marcaine. A subcuticular closure was done with 3-0 vicryl suture. She tolerated the procedure  well and was taken to the recovery room in stable condition. Her Foley catheter drained clear urine throughout. She was extubated and taken to the recovery room in stable condition.

## 2012-12-25 NOTE — Preoperative (Signed)
Beta Blockers   Reason not to administer Beta Blockers:Not Applicable 

## 2012-12-25 NOTE — Progress Notes (Signed)
UR completed 

## 2012-12-25 NOTE — Anesthesia Preprocedure Evaluation (Signed)
Anesthesia Evaluation    Reviewed: Allergy & Precautions, H&P , NPO status , Patient's Chart, lab work & pertinent test results  Airway Mallampati: II TM Distance: >3 FB Neck ROM: full    Dental no notable dental hx. (+) Teeth Intact   Pulmonary asthma , Current Smoker,  Will use inhaler prior to OR   Pulmonary exam normal       Cardiovascular negative cardio ROS      Neuro/Psych PSYCHIATRIC DISORDERS Bipolar Disorder negative neurological ROS     GI/Hepatic negative GI ROS, Neg liver ROS,   Endo/Other  Hypothyroidism   Renal/GU negative Renal ROS  negative genitourinary   Musculoskeletal negative musculoskeletal ROS (+)   Abdominal Normal abdominal exam  (+)   Peds  Hematology negative hematology ROS (+)   Anesthesia Other Findings   Reproductive/Obstetrics negative OB ROS                           Anesthesia Physical Anesthesia Plan  ASA: II  Anesthesia Plan: General   Post-op Pain Management:    Induction: Intravenous  Airway Management Planned: Oral ETT  Additional Equipment:   Intra-op Plan:   Post-operative Plan: Extubation in OR  Informed Consent: I have reviewed the patients History and Physical, chart, labs and discussed the procedure including the risks, benefits and alternatives for the proposed anesthesia with the patient or authorized representative who has indicated his/her understanding and acceptance.   Dental Advisory Given  Plan Discussed with: CRNA and Surgeon  Anesthesia Plan Comments:         Anesthesia Quick Evaluation

## 2012-12-26 ENCOUNTER — Encounter (HOSPITAL_COMMUNITY): Payer: Self-pay | Admitting: Obstetrics & Gynecology

## 2012-12-26 MED ORDER — BISACODYL 10 MG RE SUPP
10.0000 mg | Freq: Every day | RECTAL | Status: DC | PRN
  Filled 2012-12-26: qty 1

## 2012-12-26 MED ORDER — NICOTINE 21 MG/24HR TD PT24
21.0000 mg | MEDICATED_PATCH | Freq: Every day | TRANSDERMAL | Status: DC
  Administered 2012-12-26 – 2012-12-27 (×2): 21 mg via TRANSDERMAL
  Filled 2012-12-26 (×3): qty 1

## 2012-12-26 NOTE — Progress Notes (Signed)
1 Day Post-Op Procedure(s) (LRB): HYSTERECTOMY ABDOMINAL (Bilateral) BILATERAL SALPINGECTOMY (Bilateral) OOPHORECTOMY (Bilateral)  Subjective: Patient reports no problems voiding.  She is ambulating and tolerating po, but still has yet to have flatus.  Objective: I have reviewed patient's vital signs, intake and output, medications and labs.  General: alert Resp: clear to auscultation bilaterally Cardio: regular rate and rhythm, S1, S2 normal, no murmur, click, rub or gallop GI: soft, non-tender; bowel sounds normal; no masses,  no organomegaly Dressing: C/D/I Assessment: s/p Procedure(s) with comments: HYSTERECTOMY ABDOMINAL (Bilateral) - TAH and bilateral salpingectomy BILATERAL SALPINGECTOMY (Bilateral) OOPHORECTOMY (Bilateral): stable and progressing well  Plan: Discharge home later today after flatus or tomorrow per patient desire  LOS: 1 day    Naly Schwanz C. 12/26/2012, 8:43 AM

## 2012-12-26 NOTE — Progress Notes (Signed)
POD # 1, TAH-Bilateral salpingectomy  Subjective: Patient reports incisional pain, tolerating PO and no problems voiding.  Not passing flatus yet. Reports vaginal bleeding minimal. Foley out and voiding well.  Objective:  Filed Vitals:   12/25/12 2055 12/25/12 2220 12/26/12 0132 12/26/12 0600  BP:  114/68 113/63 102/63  Pulse:  62 69 64  Temp:   97.4 F (36.3 C) 97.7 F (36.5 C)  TempSrc:   Oral Oral  Resp:  20 18 18   Height: 5\' 4"  (1.626 m)     Weight: 182 lb (82.555 kg)     SpO2:  97% 99% 98%     Intake/Output Summary (Last 24 hours) at 12/26/12 0757 Last data filed at 12/26/12 0600  Gross per 24 hour  Intake 3486.08 ml  Output   5300 ml  Net -1813.92 ml     I have reviewed patient's vital signs, intake and output, medications and labs.  General: alert, cooperative and anxious/tearful Resp: clear to auscultation bilaterally Cardio: regular rate and rhythm, S1, S2 normal, no murmur, click, rub or gallop GI: incision: clean, dry and intact Extremities: extremities normal, atraumatic, no cyanosis or edema and Homans sign is negative, no sign of DVT Vaginal Bleeding: minimal   Assessment/Plan: POD #1 s/p TAH-BS.  VSS, good urine output Continue routine post-op care    LOS: 1 day    Napoleon Form 12/26/2012, 7:53 AM

## 2012-12-27 MED ORDER — IBUPROFEN 800 MG PO TABS: 800.0000 mg | ORAL_TABLET | Freq: Three times a day (TID) | ORAL | Status: DC | PRN

## 2012-12-27 MED ORDER — DOCUSATE SODIUM 100 MG PO CAPS: 100.0000 mg | ORAL_CAPSULE | Freq: Two times a day (BID) | ORAL | Status: DC | PRN

## 2012-12-27 MED ORDER — ESTRADIOL 1 MG PO TABS: 1.0000 mg | ORAL_TABLET | Freq: Every day | ORAL | Status: DC

## 2012-12-27 MED ORDER — OXYCODONE-ACETAMINOPHEN 5-325 MG PO TABS: 1.0000 | ORAL_TABLET | Freq: Four times a day (QID) | ORAL | Status: DC | PRN

## 2012-12-27 NOTE — Progress Notes (Signed)
Discharge instructions provided to patient at bedside.  Medications, activity, incision care, follow up appointments, when to call the doctor and community resources discussed.  No questions at this time.  Patient left unit with all personal belongings, personal walker, cane and prescriptions in stable condition.  Osvaldo Angst, RN----------------------------

## 2012-12-27 NOTE — Discharge Summary (Signed)
Gynecology Physician Discharge Summary  Patient ID: Kristen Moore MRN: [id number] DOB/AGE: 1971-03-28 42 y.o.  Admit date: 12/25/2012 Discharge date: 12/27/2012  Preoperative Diagnoses: Multiple uterine fibroids, menorrhagia and anemia, pelvic pain, endometriosis  Procedures: Procedure(s) (LRB): HYSTERECTOMY ABDOMINAL (Bilateral) BILATERAL SALPINGECTOMY (Bilateral) OOPHORECTOMY (Bilateral)  Significant Diagnostic Studies: Preoperative hemoglobin 10.6   Recent Labs Lab 12/24/12 1340  WBC 6.4  HGB 10.3*  HCT 32.6*  PLT 224     Hospital Course:  Kristen Moore is a 42 y.o. [id number] admitted for scheduled surgery.  She underwent the procedures as mentioned above, her operation was uncomplicated. For further details about surgery, please refer to the operative report. Patient had an uncomplicated postoperative course. By time of discharge on POD#2, her pain was controlled on oral pain medications; she was ambulating, voiding without difficulty, tolerating regular diet and passing flatus. She was deemed stable for discharge to home.   Discharge Exam: Blood pressure 118/73, pulse 65, temperature 98.2 F (36.8 C), temperature source Oral, resp. rate 15, height 5\' 4"  (1.626 m), weight 182 lb (82.555 kg), last menstrual period 11/29/2012, SpO2 100.00%. General appearance: alert, cooperative and no distress Resp: clear to auscultation bilaterally Cardio: regular rate and rhythm GI: soft, non-tender; bowel sounds normal; no masses,  no organomegaly Incision: Dressing in place, clean, dry and intact Pelvic: deferred Extremities: extremities normal, atraumatic, no cyanosis or edema and Homans sign is negative, no sign of DVT  Discharged Condition: Stable  Disposition: Home    Medication List         albuterol 108 (90 BASE) MCG/ACT inhaler  Commonly known as:  PROVENTIL HFA;VENTOLIN HFA  Inhale 1-2 puffs into the lungs every 6 (six) hours as needed for wheezing.     CALCIUM 600/VITAMIN D PO  Take 1 tablet by mouth daily.     docusate sodium 100 MG capsule  Commonly known as:  COLACE  Take 1 capsule (100 mg total) by mouth 2 (two) times daily as needed for constipation.     estradiol 1 MG tablet  Commonly known as:  ESTRACE  Take 1 tablet (1 mg total) by mouth daily.     Fish Oil 1200 MG Caps  Take 1 capsule by mouth daily.     ibuprofen 800 MG tablet  Commonly known as:  ADVIL,MOTRIN  Take 1 tablet (800 mg total) by mouth every 8 (eight) hours as needed (mild pain).     levothyroxine 150 MCG tablet  Commonly known as:  SYNTHROID, LEVOTHROID  Take 1 tablet (150 mcg total) by mouth daily.     lithium carbonate 300 MG capsule  Take 300-600 mg by mouth 2 (two) times daily with a meal. Takes 300 mg in the morning and 600 mg at night.     OVER THE COUNTER MEDICATION  Take 3 tablets by mouth 2 (two) times daily as needed (for pain). [facility] brand acetaminophen     oxyCODONE-acetaminophen 5-325 MG per tablet  Commonly known as:  PERCOCET/ROXICET  Take 1-2 tablets by mouth every 6 (six) hours as needed (severe pain).           Follow-up Information   Follow up with DOVE,MYRA C., MD. Schedule an appointment as soon as possible for a visit in 6 weeks.   Contact information:   [address] [place] [address] [phone]       Signed:  Jaynie Collins, MD, FACOG Attending Obstetrician & Gynecologist Faculty Practice, [facility]

## 2013-01-12 ENCOUNTER — Telehealth: Payer: Self-pay | Admitting: *Deleted

## 2013-01-12 NOTE — Telephone Encounter (Signed)
Called pt and I informed pt that her FMLA papers will completed tomorrow and if she could call us back with fax #.  Will send them directly to where they need to be.  Pt stated that she would call back tomorrow and leave on nurse call back line the fax # to send paper to.  Both left with understanding.

## 2013-01-12 NOTE — Telephone Encounter (Signed)
Nessa called and left  A message she left some papers for work about 3 weeks ago and we told her we would fill them out and call her. States her post op visit is coming up in a few weeks , and her work needs her papers- wants to know why it is taking so long.

## 2013-01-13 ENCOUNTER — Encounter: Payer: Self-pay | Admitting: *Deleted

## 2013-02-09 ENCOUNTER — Encounter: Payer: Self-pay | Admitting: Obstetrics & Gynecology

## 2013-02-09 ENCOUNTER — Ambulatory Visit (INDEPENDENT_AMBULATORY_CARE_PROVIDER_SITE_OTHER): Payer: Self-pay | Admitting: Obstetrics & Gynecology

## 2013-02-09 VITALS — BP 133/86 | HR 60 | Temp 96.7°F | Ht 64.0 in | Wt 178.1 lb

## 2013-02-09 DIAGNOSIS — Z09 Encounter for follow-up examination after completed treatment for conditions other than malignant neoplasm: Secondary | ICD-10-CM

## 2013-02-09 NOTE — Progress Notes (Signed)
  Subjective:    Patient ID: Kristen Moore, female    DOB: Oct 06, 1970, 42 y.o.   MRN: [id number]  HPI  She is now 6 weeks post op s/p TAH/BSO for fibroids and endometriosis. She is doing well on po estradiol. She has not had sex yet. She would like a note to return to work. She reports that her IBS has flared up since surgery.  Review of Systems     Objective:   Physical Exam  Well-healed transverse incision and vaginal cuff with speculum exam Normal bimanual exam      Assessment & Plan:   Post op- doing well IBS flare- I recommended probiotics. If that is no help, then she should see her Primary care MD

## 2013-04-09 ENCOUNTER — Other Ambulatory Visit: Payer: Self-pay

## 2013-04-09 ENCOUNTER — Ambulatory Visit: Payer: Self-pay | Attending: Internal Medicine | Admitting: Pharmacist

## 2013-04-09 VITALS — Temp 98.3°F | Wt 181.4 lb

## 2013-04-09 DIAGNOSIS — Z23 Encounter for immunization: Secondary | ICD-10-CM

## 2013-04-09 NOTE — Progress Notes (Signed)
Patient here for flu vaccine Asked about allergies patient stated had an allergy to only penicillin No allergy to eggs or latex

## 2013-04-20 ENCOUNTER — Telehealth: Payer: Self-pay | Admitting: General Practice

## 2013-04-20 DIAGNOSIS — Z90710 Acquired absence of both cervix and uterus: Secondary | ICD-10-CM

## 2013-04-20 DIAGNOSIS — E8940 Asymptomatic postprocedural ovarian failure: Secondary | ICD-10-CM

## 2013-04-20 NOTE — Telephone Encounter (Signed)
Patient has been seeing Dr Marice Potter in the past. Called patient and her daughter answered stating she was at work at the moment. Asked that she tell the patient that we were calling trying to return her phone call. Patient's daughter stated that she would

## 2013-04-20 NOTE — Telephone Encounter (Signed)
Patient called and left message that she had a hysterectomy 7/24 and was put on hormones but is out now and she doesn't have any refills and she wants to know if she needs to continue taking this medication and if so can she have refills.

## 2013-04-23 MED ORDER — ESTRADIOL 1 MG PO TABS: 1.0000 mg | ORAL_TABLET | Freq: Every day | ORAL | Status: DC

## 2013-04-23 NOTE — Telephone Encounter (Signed)
Per Dr Marice Potter, can supply patient with a year worth of refills. Called patient and informed her of refills and that her medication is available for pickup at her [facility]. Patient verbalized understanding and had no further questions

## 2013-05-19 ENCOUNTER — Encounter: Payer: Self-pay | Admitting: Internal Medicine

## 2013-05-19 ENCOUNTER — Ambulatory Visit: Payer: Self-pay | Attending: Internal Medicine | Admitting: Internal Medicine

## 2013-05-19 VITALS — BP 128/82 | HR 66 | Temp 97.8°F | Resp 16 | Ht 64.0 in | Wt 182.0 lb

## 2013-05-19 DIAGNOSIS — Z23 Encounter for immunization: Secondary | ICD-10-CM

## 2013-05-19 DIAGNOSIS — E039 Hypothyroidism, unspecified: Secondary | ICD-10-CM | POA: Insufficient documentation

## 2013-05-19 DIAGNOSIS — D649 Anemia, unspecified: Secondary | ICD-10-CM | POA: Insufficient documentation

## 2013-05-19 DIAGNOSIS — K589 Irritable bowel syndrome without diarrhea: Secondary | ICD-10-CM | POA: Insufficient documentation

## 2013-05-19 LAB — CBC
HCT: 39.9 % (ref 36.0–46.0)
Hemoglobin: 13 g/dL (ref 12.0–15.0)
MCH: 26.7 pg (ref 26.0–34.0)
MCHC: 32.6 g/dL (ref 30.0–36.0)
MCV: 81.9 fL (ref 78.0–100.0)
Platelets: 214 10*3/uL (ref 150–400)
RBC: 4.87 MIL/uL (ref 3.87–5.11)
RDW: 18 % — ABNORMAL HIGH (ref 11.5–15.5)
WBC: 5.6 10*3/uL (ref 4.0–10.5)

## 2013-05-19 LAB — BASIC METABOLIC PANEL
BUN: 20 mg/dL (ref 6–23)
CO2: 26 mEq/L (ref 19–32)
Calcium: 9.5 mg/dL (ref 8.4–10.5)
Chloride: 104 mEq/L (ref 96–112)
Creat: 0.86 mg/dL (ref 0.50–1.10)
Glucose, Bld: 96 mg/dL (ref 70–99)
Potassium: 4.3 mEq/L (ref 3.5–5.3)
Sodium: 137 mEq/L (ref 135–145)

## 2013-05-19 MED ORDER — DICYCLOMINE HCL 10 MG PO CAPS: 10.0000 mg | ORAL_CAPSULE | Freq: Three times a day (TID) | ORAL | Status: DC

## 2013-05-19 NOTE — Progress Notes (Signed)
Pt is here needing medication for her hypothyroidism. Ever since she had a hysterectomy on July she has been having constant stomach pains, diarrhea and inconsistent stool. She is treating with probiotics. She fell today and is soar at her left arm and left ankle.

## 2013-05-19 NOTE — Progress Notes (Signed)
Patient ID: Kristen Moore, female   DOB: [birth year], 42 y.o.   MRN: [id number] Subjective: 42 year old female with history of bipolar disorder,severe  IBS, hypothyroidism who is here for followup and have a thyroid function checked. Patient reports having coarse hair which has started to fall off, mood swings for past 2 weeks. She denies any significant change in her weight or appetite does feel bloated repeated denies any menstrual symptoms or UTI symptoms. Denies any headache, fever, chills, blurred vision, nausea, vomiting, chest pain, palpitations, shortness of breath. She reports having severe IBS with episodes of abdominal cramping and diarrhea. She had seen GI Dr. Elnoria Howard back in 2007 for similar symptoms. She reports being on probiotic daily her symptoms.  Objective:  Vital signs in last 24 hours:  Filed Vitals:   05/19/13 1736  BP: 128/82  Pulse: 66  Temp: 97.8 F (36.6 C)  TempSrc: Oral  Resp: 16  Height: 5\' 4"  (1.626 m)  Weight: 182 lb (82.555 kg)  SpO2: 96%      Physical Exam:  General: Middle-aged female in no acute distress. HEENT: no pallor, no icterus, moist oral mucosa, no thyromegaly, no lymphadenopathy Heart: Normal  s1 &s2  Regular rate and rhythm, without murmurs, rubs, gallops. Lungs: Clear to auscultation bilaterally. Abdomen: Soft, nontender, nondistended, positive bowel sounds. Extremities: Warm, no edema Neuro: Alert, awake, oriented x3, nonfocal.   Lab Results:  Basic Metabolic Panel:    Component Value Date/Time   NA 138 07/17/2010 1924   K 4.2 07/17/2010 1924   CL 104 07/17/2010 1924   CO2 25 07/17/2010 1924   BUN 13 07/17/2010 1924   CREATININE 0.76 07/17/2010 1924   GLUCOSE 94 07/17/2010 1924   CALCIUM 9.4 07/17/2010 1924   CBC:    Component Value Date/Time   WBC 6.4 12/24/2012 1340   HGB 10.3* 12/24/2012 1340   HCT 32.6* 12/24/2012 1340   PLT 224 12/24/2012 1340   MCV 80.7 12/24/2012 1340   NEUTROABS 3.3 12/10/2011 2025   LYMPHSABS 1.6  12/10/2011 2025   MONOABS 0.4 12/10/2011 2025   EOSABS 0.1 12/10/2011 2025   BASOSABS 0.0 12/10/2011 2025    No results found for this or any previous visit (from the past 240 hour(s)).  Studies/Results: No results found.  Medications: Scheduled Meds: Continuous Infusions: PRN Meds:.    Assessment/Plan: Hypothyroidism Last TSH in June was normal. We'll check repeat TSH and free T4 today and refill her prescription based on the results.   IBS I will prescribe her a course of dicyclomine. Given her severity of symptoms will refer her to outpatient GI.  Anemia Check CBC and BMET today.  Health Maintenance Patient has deceitful vaccine this season Patient had recent hysterectomy done Refer to GYN for mammogram Patient requests for tetanus vaccine . Will order  Followup in 3 months  Kamrin Sibley 05/19/2013, 5:43 PM

## 2013-05-20 LAB — TSH: TSH: 4.266 u[IU]/mL (ref 0.350–4.500)

## 2013-05-20 LAB — T4, FREE: Free T4: 1.02 ng/dL (ref 0.80–1.80)

## 2013-05-21 ENCOUNTER — Telehealth: Payer: Self-pay | Admitting: Emergency Medicine

## 2013-05-21 ENCOUNTER — Telehealth: Payer: Self-pay | Admitting: Internal Medicine

## 2013-05-21 ENCOUNTER — Other Ambulatory Visit: Payer: Self-pay | Admitting: Emergency Medicine

## 2013-05-21 DIAGNOSIS — E039 Hypothyroidism, unspecified: Secondary | ICD-10-CM

## 2013-05-21 MED ORDER — LEVOTHYROXINE SODIUM 150 MCG PO TABS: 150.0000 ug | ORAL_TABLET | Freq: Every day | ORAL | Status: DC

## 2013-05-21 NOTE — Telephone Encounter (Signed)
Spoke with pt today. Lab results given Pt to pick script Levothyroxine up from pharmacy

## 2013-05-21 NOTE — Telephone Encounter (Signed)
Pt called regarding her thyroid test results, Also pt would like a refill of her thyroid medication, please contact pt

## 2013-05-21 NOTE — Telephone Encounter (Signed)
Pt called in today to see if she can get her lab results;

## 2013-06-23 ENCOUNTER — Other Ambulatory Visit: Payer: Self-pay | Admitting: Obstetrics & Gynecology

## 2013-07-22 ENCOUNTER — Telehealth: Payer: Self-pay | Admitting: Internal Medicine

## 2013-07-22 DIAGNOSIS — E039 Hypothyroidism, unspecified: Secondary | ICD-10-CM

## 2013-07-22 NOTE — Telephone Encounter (Signed)
Pt called regarding a refill of her thyroid medication, Please contact pt

## 2013-07-23 MED ORDER — LEVOTHYROXINE SODIUM 150 MCG PO TABS: 150.0000 ug | ORAL_TABLET | Freq: Every day | ORAL | Status: DC

## 2013-07-23 NOTE — Telephone Encounter (Signed)
Spoke with patient-needs refill on thyroid medication Sent to [facility] [address]

## 2013-08-17 ENCOUNTER — Ambulatory Visit: Payer: Self-pay | Admitting: Internal Medicine

## 2013-09-07 ENCOUNTER — Other Ambulatory Visit: Payer: Self-pay | Admitting: Obstetrics & Gynecology

## 2013-09-18 ENCOUNTER — Other Ambulatory Visit: Payer: Self-pay | Admitting: Obstetrics & Gynecology

## 2013-09-18 DIAGNOSIS — Z1231 Encounter for screening mammogram for malignant neoplasm of breast: Secondary | ICD-10-CM

## 2013-09-28 ENCOUNTER — Ambulatory Visit (HOSPITAL_COMMUNITY)
Admission: RE | Admit: 2013-09-28 | Discharge: 2013-09-28 | Disposition: A | Discharge status: Home / Self Care | Payer: Self-pay | Source: Ambulatory Visit | Attending: Obstetrics & Gynecology | Admitting: Obstetrics & Gynecology

## 2013-09-28 DIAGNOSIS — Z1231 Encounter for screening mammogram for malignant neoplasm of breast: Secondary | ICD-10-CM

## 2013-09-30 ENCOUNTER — Other Ambulatory Visit: Payer: Self-pay | Admitting: Internal Medicine

## 2013-10-06 ENCOUNTER — Encounter (HOSPITAL_COMMUNITY): Payer: Self-pay | Admitting: Emergency Medicine

## 2013-10-06 ENCOUNTER — Emergency Department (HOSPITAL_COMMUNITY)
Admission: EM | Admit: 2013-10-06 | Discharge: 2013-10-06 | Disposition: A | Discharge status: Home / Self Care | Payer: Self-pay | Attending: Emergency Medicine | Admitting: Emergency Medicine

## 2013-10-06 DIAGNOSIS — R109 Unspecified abdominal pain: Secondary | ICD-10-CM | POA: Insufficient documentation

## 2013-10-06 LAB — COMPREHENSIVE METABOLIC PANEL
ALT: 14 U/L (ref 0–35)
AST: 16 U/L (ref 0–37)
Albumin: 4.2 g/dL (ref 3.5–5.2)
Alkaline Phosphatase: 57 U/L (ref 39–117)
BUN: 8 mg/dL (ref 6–23)
CO2: 25 mEq/L (ref 19–32)
Calcium: 9.8 mg/dL (ref 8.4–10.5)
Chloride: 104 mEq/L (ref 96–112)
Creatinine, Ser: 0.75 mg/dL (ref 0.50–1.10)
GFR calc Af Amer: >90 mL/min (ref >90)
GFR calc non Af Amer: >90 mL/min (ref >90)
Glucose, Bld: 104 mg/dL — ABNORMAL HIGH (ref 70–99)
Potassium: 4.3 mEq/L (ref 3.7–5.3)
Sodium: 139 mEq/L (ref 137–147)
Total Bilirubin: 0.3 mg/dL (ref 0.3–1.2)
Total Protein: 7.3 g/dL (ref 6.0–8.3)

## 2013-10-06 LAB — CBC WITH DIFFERENTIAL/PLATELET
Basophils Absolute: 0 10*3/uL (ref 0.0–0.1)
Basophils Relative: 1 % (ref 0–1)
Eosinophils Absolute: 0.2 10*3/uL (ref 0.0–0.7)
Eosinophils Relative: 3 % (ref 0–5)
HCT: 45.2 % (ref 36.0–46.0)
Hemoglobin: 15 g/dL (ref 12.0–15.0)
Lymphocytes Relative: 28 % (ref 12–46)
Lymphs Abs: 1.5 10*3/uL (ref 0.7–4.0)
MCH: 31.7 pg (ref 26.0–34.0)
MCHC: 33.2 g/dL (ref 30.0–36.0)
MCV: 95.6 fL (ref 78.0–100.0)
Monocytes Absolute: 0.3 10*3/uL (ref 0.1–1.0)
Monocytes Relative: 5 % (ref 3–12)
Neutro Abs: 3.4 10*3/uL (ref 1.7–7.7)
Neutrophils Relative %: 63 % (ref 43–77)
Platelets: 187 10*3/uL (ref 150–400)
RBC: 4.73 MIL/uL (ref 3.87–5.11)
RDW: 13 % (ref 11.5–15.5)
WBC: 5.4 10*3/uL (ref 4.0–10.5)

## 2013-10-06 LAB — LIPASE, BLOOD: Lipase: 24 U/L (ref 11–59)

## 2013-10-06 NOTE — ED Notes (Signed)
Pt reports constant abdominal pain after eating, also has yellow diarrhea. Has been going on since surgery last year, hysterectomy.

## 2013-10-06 NOTE — ED Provider Notes (Signed)
I signed up for this patient but she was never brought back to a room and then was changed to OTF.  Linus Mako, PA-C 10/06/13 1429

## 2013-10-06 NOTE — ED Provider Notes (Signed)
Medical screening examination/treatment/procedure(s) were performed by non-physician practitioner and as supervising physician I was immediately available for consultation/collaboration.   EKG Interpretation None        Osvaldo Shipper, MD 10/06/13 2026

## 2013-10-07 ENCOUNTER — Telehealth: Payer: Self-pay

## 2013-10-07 ENCOUNTER — Telehealth: Payer: Self-pay | Admitting: Internal Medicine

## 2013-10-07 ENCOUNTER — Other Ambulatory Visit: Payer: Self-pay | Admitting: Internal Medicine

## 2013-10-07 DIAGNOSIS — E039 Hypothyroidism, unspecified: Secondary | ICD-10-CM

## 2013-10-07 MED ORDER — LEVOTHYROXINE SODIUM 150 MCG PO TABS: 150.0000 ug | ORAL_TABLET | Freq: Every day | ORAL | Status: DC

## 2013-10-07 NOTE — Telephone Encounter (Signed)
Patient called requesting a refill on her thyroid medication Has an appointment scheduled  For next week Refill sent to the [facility] on file

## 2013-10-07 NOTE — Telephone Encounter (Signed)
Pt called requesting a refill of her thyroid medication, please contact pt

## 2013-10-12 ENCOUNTER — Ambulatory Visit: Payer: Self-pay | Attending: Internal Medicine | Admitting: Internal Medicine

## 2013-10-12 ENCOUNTER — Encounter: Payer: Self-pay | Admitting: Internal Medicine

## 2013-10-12 VITALS — BP 123/76 | HR 55 | Temp 98.0°F | Ht 64.0 in | Wt 182.6 lb

## 2013-10-12 DIAGNOSIS — F319 Bipolar disorder, unspecified: Secondary | ICD-10-CM | POA: Insufficient documentation

## 2013-10-12 DIAGNOSIS — K589 Irritable bowel syndrome without diarrhea: Secondary | ICD-10-CM | POA: Insufficient documentation

## 2013-10-12 DIAGNOSIS — E039 Hypothyroidism, unspecified: Secondary | ICD-10-CM | POA: Insufficient documentation

## 2013-10-12 DIAGNOSIS — D649 Anemia, unspecified: Secondary | ICD-10-CM | POA: Insufficient documentation

## 2013-10-12 DIAGNOSIS — F17200 Nicotine dependence, unspecified, uncomplicated: Secondary | ICD-10-CM | POA: Insufficient documentation

## 2013-10-12 MED ORDER — DICYCLOMINE HCL 20 MG PO TABS: 20.0000 mg | ORAL_TABLET | Freq: Three times a day (TID) | ORAL | Status: DC

## 2013-10-12 NOTE — Patient Instructions (Signed)
Irritable Bowel Syndrome °Irritable Bowel Syndrome (IBS) is caused by a disturbance of normal bowel function. Other terms used are spastic colon, mucous colitis, and irritable colon. It does not require surgery, nor does it lead to cancer. There is no cure for IBS. But with proper diet, stress reduction, and medication, you will find that your problems (symptoms) will gradually disappear or improve. IBS is a common digestive disorder. It usually appears in late adolescence or early adulthood. Women develop it twice as often as men. °CAUSES  °After food has been digested and absorbed in the small intestine, waste material is moved into the colon (large intestine). In the colon, water and salts are absorbed from the undigested products coming from the small intestine. The remaining residue, or fecal material, is held for elimination. Under normal circumstances, gentle, rhythmic contractions on the bowel walls push the fecal material along the colon towards the rectum. In IBS, however, these contractions are irregular and poorly coordinated. The fecal material is either retained too long, resulting in constipation, or expelled too soon, producing diarrhea. °SYMPTOMS  °The most common symptom of IBS is pain. It is typically in the lower left side of the belly (abdomen). But it may occur anywhere in the abdomen. It can be felt as heartburn, backache, or even as a dull pain in the arms or shoulders. The pain comes from excessive bowel-muscle spasms and from the buildup of gas and fecal material in the colon. This pain: °· Can range from sharp belly (abdominal) cramps to a dull, continuous ache. °· Usually worsens soon after eating. °· Is typically relieved by having a bowel movement or passing gas. °Abdominal pain is usually accompanied by constipation. But it may also produce diarrhea. The diarrhea typically occurs right after a meal or upon arising in the morning. The stools are typically soft and watery. They are often  flecked with secretions (mucus). °Other symptoms of IBS include: °· Bloating. °· Loss of appetite. °· Heartburn. °· Feeling sick to your stomach (nausea). °· Belching °· Vomiting °· Gas. °IBS may also cause a number of symptoms that are unrelated to the digestive system: °· Fatigue. °· Headaches. °· Anxiety °· Shortness of breath °· Difficulty in concentrating. °· Dizziness. °These symptoms tend to come and go. °DIAGNOSIS  °The symptoms of IBS closely mimic the symptoms of other, more serious digestive disorders. So your caregiver may wish to perform a variety of additional tests to exclude these disorders. He/she wants to be certain of learning what is wrong (diagnosis). The nature and purpose of each test will be explained to you. °TREATMENT °A number of medications are available to help correct bowel function and/or relieve bowel spasms and abdominal pain. Among the drugs available are: °· Mild, non-irritating laxatives for severe constipation and to help restore normal bowel habits. °· Specific anti-diarrheal medications to treat severe or prolonged diarrhea. °· Anti-spasmodic agents to relieve intestinal cramps. °· Your caregiver may also decide to treat you with a mild tranquilizer or sedative during unusually stressful periods in your life. °The important thing to remember is that if any drug is prescribed for you, make sure that you take it exactly as directed. Make sure that your caregiver knows how well it worked for you. °HOME CARE INSTRUCTIONS  °· Avoid foods that are high in fat or oils. Some examples are:heavy cream, butter, frankfurters, sausage, and other fatty meats. °· Avoid foods that have a laxative effect, such as fruit, fruit juice, and dairy products. °· Cut out   carbonated drinks, chewing gum, and "gassy" foods, such as beans and cabbage. This may help relieve bloating and belching. °· Bran taken with plenty of liquids may help relieve constipation. °· Keep track of what foods seem to trigger  your symptoms. °· Avoid emotionally charged situations or circumstances that produce anxiety. °· Start or continue exercising. °· Get plenty of rest and sleep. °MAKE SURE YOU:  °· Understand these instructions. °· Will watch your condition. °· Will get help right away if you are not doing well or get worse. °Document Released: 05/21/2005 Document Revised: 08/13/2011 Document Reviewed: 01/09/2008 °ExitCare® Patient Information ©2014 [employer]. ° °

## 2013-10-12 NOTE — Progress Notes (Signed)
Patient is here to F/U for abdominal pain Staes no pain until she eats then rates 10/10 followed by explosive yellow diarrhea States abdominal pain has been present for a year but has worsened in last 7 days. PHQ-9 score of 17. Patient states she has a counseling appt this Wednesday. Provider aware.

## 2013-10-13 NOTE — Progress Notes (Signed)
Patient ID: Kristen Moore, female   DOB: 06-Sep-1970, 43 y.o.   MRN: [id number]  CC: Abdominal pain  HPI: Patient presents today with complaints of abdominal pain. Patient reports that she was evaluated in the office a couple months back and was diagnosed with irritable bowel syndrome. Since diagnosis she has been taking 10 mg of Bentyl 4 times daily with no relief. Patient states the abdominal pain has become progressively worse over the past year. She reports the pain as a cramp sensation. She states that the pain is worse postprandial and she often has 2-4 episodes of diarrhea after each meal. She reports the diarrhea begins exactly 30 minutes after each meal and the diarrhea is yellow in color.  Patient denies any nausea, vomiting, bloody stools.  Allergies  Allergen Reactions  . Latex Swelling  . Penicillins Swelling  . Other Hives and Swelling    Potatoes      Past Medical History  Diagnosis Date  . Bipolar 1 disorder   . IBS (irritable bowel syndrome)   . Thyroid dysfunction   . Abnormal Pap smear     cryo  . Fibroids   . DUB (dysfunctional uterine bleeding)   . Cyst of breast     left breast  . Hypothyroidism   . Anemia   . Asthma   . NGEXBMWU(132.4)    Current Outpatient Prescriptions on File Prior to Visit  Medication Sig Dispense Refill  . acetaminophen (TYLENOL) 500 MG tablet Take 1,000 mg by mouth every 6 (six) hours as needed for moderate pain.      Marland Kitchen estradiol (ESTRACE) 1 MG tablet Take 1 tablet (1 mg total) by mouth daily.  30 tablet  11  . levothyroxine (SYNTHROID, LEVOTHROID) 150 MCG tablet Take 1 tablet (150 mcg total) by mouth daily.  30 tablet  1  . lithium carbonate 300 MG capsule Take 300-600 mg by mouth 2 (two) times daily with a meal. Takes 300 mg in the morning and 600 mg at night.      . Probiotic Product (PROBIOTIC DAILY PO) Take 1 tablet by mouth 2 (two) times daily.      . Calcium Carbonate-Vitamin D (CALCIUM 600/VITAMIN D PO) Take 1 tablet by  mouth 2 (two) times a week.        No current facility-administered medications on file prior to visit.   Family History  Problem Relation Age of Onset  . Breast cancer Maternal Grandmother   . Heart disease Father   . Hypertension Mother   . Breast cancer Paternal Aunt   . Breast cancer Maternal Aunt   . Rheum arthritis Mother   . Fibromyalgia Mother   . COPD Mother   . Sleep apnea Mother    History   Social History  . Marital Status: Single    Spouse Name: N/A    Number of Children: N/A  . Years of Education: N/A   Occupational History  . Not on file.   Social History Main Topics  . Smoking status: Current Every Day Smoker -- 1.00 packs/day for 9 years    Types: Cigarettes  . Smokeless tobacco: Never Used  . Alcohol Use: No  . Drug Use: No  . Sexual Activity: Not Currently    Birth Control/ Protection: Surgical   Other Topics Concern  . Not on file   Social History Narrative  . No narrative on file    Review of Systems  Constitutional: Negative for fever, chills and weight loss.  Respiratory: Negative.   Cardiovascular: Negative.   Gastrointestinal: Positive for abdominal pain (generalized pain, feels like knots in her stomach) and diarrhea. Negative for nausea, vomiting, constipation and blood in stool.  Neurological: Negative.      Objective:   Filed Vitals:   10/12/13 1239  BP: 123/76  Pulse: 55  Temp: 98 F (36.7 C)   Physical Exam  Vitals reviewed. Constitutional: She is oriented to person, place, and time. She appears well-developed.  Cardiovascular: Normal rate, regular rhythm and normal heart sounds.   Pulmonary/Chest: Effort normal and breath sounds normal.  Abdominal: Soft. Bowel sounds are normal. She exhibits no mass. There is tenderness (generalized). There is no rebound and no guarding.  Neurological: She is alert and oriented to person, place, and time.  Skin: Skin is warm and dry. She is not diaphoretic.  Psychiatric: She has a  normal mood and affect.     Lab Results  Component Value Date   WBC 5.4 10/06/2013   HGB 15.0 10/06/2013   HCT 45.2 10/06/2013   MCV 95.6 10/06/2013   PLT 187 10/06/2013   Lab Results  Component Value Date   CREATININE 0.75 10/06/2013   BUN 8 10/06/2013   NA 139 10/06/2013   K 4.3 10/06/2013   CL 104 10/06/2013   CO2 25 10/06/2013    No results found for this basename: HGBA1C   Lipid Panel     Component Value Date/Time   CHOL 186 12/28/2009 2304   TRIG 470* 12/28/2009 2304   HDL 36* 12/28/2009 2304   CHOLHDL 5.2 Ratio 12/28/2009 2304   VLDL NOT CALC mg/dL 12/28/2009 2304   LDLCALC See Comment mg/dL 12/28/2009 2304       Assessment and plan:   Kristen Moore was seen today for follow-up and abdominal pain.  Diagnoses and associated orders for this visit:  IBS (irritable bowel syndrome) - dicyclomine (BENTYL) 20 MG tablet; Take 1 tablet (20 mg total) by mouth 4 (four) times daily -  before meals and at bedtime. Dose increased from 10 mg QID. - Ambulatory referral to Gastroenterology.  Consulted with Dr. Annitta Needs  Return in about 3 months (around 01/12/2014) for IBS.       Chari Manning, NP-C [facility] [phone] 10/13/2013, 10:51 PM

## 2013-11-16 ENCOUNTER — Encounter: Payer: Self-pay | Admitting: Internal Medicine

## 2013-12-10 ENCOUNTER — Other Ambulatory Visit: Payer: Self-pay | Admitting: Internal Medicine

## 2013-12-26 ENCOUNTER — Encounter (HOSPITAL_COMMUNITY): Payer: Self-pay | Admitting: Emergency Medicine

## 2013-12-26 ENCOUNTER — Emergency Department (HOSPITAL_COMMUNITY)
Admission: EM | Admit: 2013-12-26 | Discharge: 2013-12-26 | Discharge status: Against Medical Advice | Payer: Self-pay | Attending: Emergency Medicine | Admitting: Emergency Medicine

## 2013-12-26 DIAGNOSIS — F17200 Nicotine dependence, unspecified, uncomplicated: Secondary | ICD-10-CM | POA: Insufficient documentation

## 2013-12-26 DIAGNOSIS — R079 Chest pain, unspecified: Secondary | ICD-10-CM | POA: Insufficient documentation

## 2013-12-26 DIAGNOSIS — J45909 Unspecified asthma, uncomplicated: Secondary | ICD-10-CM | POA: Insufficient documentation

## 2013-12-26 LAB — BASIC METABOLIC PANEL
Anion gap: 13 (ref 5–15)
BUN: 12 mg/dL (ref 6–23)
CO2: 22 mEq/L (ref 19–32)
Calcium: 9.5 mg/dL (ref 8.4–10.5)
Chloride: 104 mEq/L (ref 96–112)
Creatinine, Ser: 0.82 mg/dL (ref 0.50–1.10)
GFR calc Af Amer: >90 mL/min (ref >90)
GFR calc non Af Amer: 88 mL/min — ABNORMAL LOW (ref >90)
Glucose, Bld: 112 mg/dL — ABNORMAL HIGH (ref 70–99)
Potassium: 3.8 mEq/L (ref 3.7–5.3)
Sodium: 139 mEq/L (ref 137–147)

## 2013-12-26 LAB — CBC
HCT: 42.5 % (ref 36.0–46.0)
Hemoglobin: 14.4 g/dL (ref 12.0–15.0)
MCH: 32.7 pg (ref 26.0–34.0)
MCHC: 33.9 g/dL (ref 30.0–36.0)
MCV: 96.4 fL (ref 78.0–100.0)
Platelets: 192 10*3/uL (ref 150–400)
RBC: 4.41 MIL/uL (ref 3.87–5.11)
RDW: 12.5 % (ref 11.5–15.5)
WBC: 4.4 10*3/uL (ref 4.0–10.5)

## 2013-12-26 LAB — I-STAT TROPONIN, ED: Troponin i, poc: 0 ng/mL (ref 0.00–0.08)

## 2013-12-26 NOTE — ED Notes (Signed)
Pt in c/o intermittent chest pain since last night, states pain has improved since it started, worse with exertion, pt appears anxious in triage and states she has a lot of stress right now, also c/o nausea

## 2014-01-12 ENCOUNTER — Ambulatory Visit: Payer: Self-pay | Attending: Internal Medicine | Admitting: Internal Medicine

## 2014-01-12 ENCOUNTER — Encounter: Payer: Self-pay | Admitting: Internal Medicine

## 2014-01-12 VITALS — BP 113/74 | HR 74 | Temp 98.0°F | Resp 18 | Ht 64.0 in | Wt 184.4 lb

## 2014-01-12 DIAGNOSIS — E039 Hypothyroidism, unspecified: Secondary | ICD-10-CM | POA: Insufficient documentation

## 2014-01-12 DIAGNOSIS — K589 Irritable bowel syndrome without diarrhea: Secondary | ICD-10-CM | POA: Insufficient documentation

## 2014-01-12 DIAGNOSIS — F17200 Nicotine dependence, unspecified, uncomplicated: Secondary | ICD-10-CM | POA: Insufficient documentation

## 2014-01-12 DIAGNOSIS — E785 Hyperlipidemia, unspecified: Secondary | ICD-10-CM

## 2014-01-12 DIAGNOSIS — R51 Headache: Secondary | ICD-10-CM | POA: Insufficient documentation

## 2014-01-12 DIAGNOSIS — F319 Bipolar disorder, unspecified: Secondary | ICD-10-CM | POA: Insufficient documentation

## 2014-01-12 NOTE — Progress Notes (Signed)
Patient presents for f/u on IBS Requesting refill on levothyroxine States went to ED last week for chest pain but did not stay due to long wait Mom and pt think it may have been anxiety related as daughter was in altercation with boyfriend at the time Mother present

## 2014-01-12 NOTE — Patient Instructions (Signed)
Hypothyroidism The thyroid is a large gland located in the lower front of your neck. The thyroid gland helps control metabolism. Metabolism is how your body handles food. It controls metabolism with the hormone thyroxine. When this gland is underactive (hypothyroid), it produces too little hormone.  CAUSES These include:   Absence or destruction of thyroid tissue.  Goiter due to iodine deficiency.  Goiter due to medications.  Congenital defects (since birth).  Problems with the pituitary. This causes a lack of TSH (thyroid stimulating hormone). This hormone tells the thyroid to turn out more hormone. SYMPTOMS  Lethargy (feeling as though you have no energy)  Cold intolerance  Weight gain (in spite of normal food intake)  Dry skin  Coarse hair  Menstrual irregularity (if severe, may lead to infertility)  Slowing of thought processes Cardiac problems are also caused by insufficient amounts of thyroid hormone. Hypothyroidism in the newborn is cretinism, and is an extreme form. It is important that this form be treated adequately and immediately or it will lead rapidly to retarded physical and mental development. DIAGNOSIS  To prove hypothyroidism, your caregiver may do blood tests and ultrasound tests. Sometimes the signs are hidden. It may be necessary for your caregiver to watch this illness with blood tests either before or after diagnosis and treatment. TREATMENT  Low levels of thyroid hormone are increased by using synthetic thyroid hormone. This is a safe, effective treatment. It usually takes about four weeks to gain the full effects of the medication. After you have the full effect of the medication, it will generally take another four weeks for problems to leave. Your caregiver may start you on low doses. If you have had heart problems the dose may be gradually increased. It is generally not an emergency to get rapidly to normal. HOME CARE INSTRUCTIONS   Take your  medications as your caregiver suggests. Let your caregiver know of any medications you are taking or start taking. Your caregiver will help you with dosage schedules.  As your condition improves, your dosage needs may increase. It will be necessary to have continuing blood tests as suggested by your caregiver.  Report all suspected medication side effects to your caregiver. SEEK MEDICAL CARE IF: Seek medical care if you develop:  Sweating.  Tremulousness (tremors).  Anxiety.  Rapid weight loss.  Heat intolerance.  Emotional swings.  Diarrhea.  Weakness. SEEK IMMEDIATE MEDICAL CARE IF:  You develop chest pain, an irregular heart beat (palpitations), or a rapid heart beat. MAKE SURE YOU:   Understand these instructions.  Will watch your condition.  Will get help right away if you are not doing well or get worse. Document Released: 05/21/2005 Document Revised: 08/13/2011 Document Reviewed: 01/09/2008 ExitCare Patient Information 2015 [employer]. This information is not intended to replace advice given to you by your health care provider. Make sure you discuss any questions you have with your health care provider.  

## 2014-01-12 NOTE — Progress Notes (Signed)
Patient ID: Kristen Moore, female   DOB: 09-06-1970, 43 y.o.   MRN: [id number]  CC: abdominal pain  HPI:  Patient reports that she has been having pain in her abdomen when eating that causes her to have to go to the bathroom.  Reports that she has had a spell of blood clots in her stool 2 months ago. She states that she occasionally sees small amounts of blood in her stool.  [name] reports that she has always had problems with her bowels since a child.  She believes the Bentyl helps when she is able to take it. Patient reports that she needs a refill of her synthroid.  Patient reports that she went to the ER a few days ago for chest pain.  She states that she was having pain in her jaw as well.  She states that she thinks it may have been her anxiety because she has been more stress about her daughter fighting with her boyfriend.     Allergies  Allergen Reactions  . Latex Swelling  . Penicillins Swelling  . Other Hives and Swelling    Potatoes      Past Medical History  Diagnosis Date  . Bipolar 1 disorder   . IBS (irritable bowel syndrome)   . Thyroid dysfunction   . Abnormal Pap smear     cryo  . Fibroids   . DUB (dysfunctional uterine bleeding)   . Cyst of breast     left breast  . Hypothyroidism   . Anemia   . Asthma   . KGMWNUUV(253.6)    Current Outpatient Prescriptions on File Prior to Visit  Medication Sig Dispense Refill  . acetaminophen (TYLENOL) 325 MG tablet Take 650 mg by mouth every 6 (six) hours as needed for moderate pain or headache.      . dicyclomine (BENTYL) 20 MG tablet Take 1 tablet (20 mg total) by mouth 4 (four) times daily -  before meals and at bedtime.  120 tablet  1  . estradiol (ESTRACE) 1 MG tablet Take 1 tablet (1 mg total) by mouth daily.  30 tablet  11  . levothyroxine (SYNTHROID, LEVOTHROID) 150 MCG tablet Take 150 mcg by mouth daily before breakfast.      . lithium carbonate 300 MG capsule Take 300-600 mg by mouth 2 (two) times daily  with a meal. Takes 300 mg in the morning at 0800, 300 mg at 1600, and 600 mg at bedtime       No current facility-administered medications on file prior to visit.   Family History  Problem Relation Age of Onset  . Breast cancer Maternal Grandmother   . Heart disease Father   . Hypertension Mother   . Breast cancer Paternal Aunt   . Breast cancer Maternal Aunt   . Rheum arthritis Mother   . Fibromyalgia Mother   . COPD Mother   . Sleep apnea Mother    History   Social History  . Marital Status: Single    Spouse Name: N/A    Number of Children: N/A  . Years of Education: N/A   Occupational History  . Not on file.   Social History Main Topics  . Smoking status: Current Every Day Smoker -- 1.00 packs/day for 9 years    Types: Cigarettes  . Smokeless tobacco: Never Used  . Alcohol Use: No  . Drug Use: No  . Sexual Activity: Not Currently    Birth Control/ Protection: Surgical   Other Topics  Concern  . Not on file   Social History Narrative  . No narrative on file   Review of Systems  Constitutional: Positive for malaise/fatigue.       Hair falling out  Gastrointestinal: Positive for abdominal pain, diarrhea and constipation.  Neurological: Positive for weakness.       Objective:   Filed Vitals:   01/12/14 1533  BP: 113/74  Pulse: 74  Temp: 98 F (36.7 C)  Resp: 18    Physical Exam  Constitutional: She is oriented to person, place, and time.  Cardiovascular: Normal rate, regular rhythm and normal heart sounds.   Pulmonary/Chest: Effort normal and breath sounds normal.  Abdominal: Soft. Bowel sounds are normal. She exhibits distension (bialteral upper quadrants).  Neurological: She is alert and oriented to person, place, and time.  Skin: Skin is warm and dry.  Psychiatric: She has a normal mood and affect.     Lab Results  Component Value Date   WBC 4.4 12/26/2013   HGB 14.4 12/26/2013   HCT 42.5 12/26/2013   MCV 96.4 12/26/2013   PLT 192 12/26/2013    Lab Results  Component Value Date   CREATININE 0.82 12/26/2013   BUN 12 12/26/2013   NA 139 12/26/2013   K 3.8 12/26/2013   CL 104 12/26/2013   CO2 22 12/26/2013    No results found for this basename: HGBA1C   Lipid Panel     Component Value Date/Time   CHOL 186 12/28/2009 2304   TRIG 470* 12/28/2009 2304   HDL 36* 12/28/2009 2304   CHOLHDL 5.2 Ratio 12/28/2009 2304   VLDL NOT CALC mg/dL 12/28/2009 2304   LDLCALC See Comment mg/dL 12/28/2009 2304       Assessment and plan:   Kristen Moore was seen today for follow-up and irritable bowel syndrome.  Diagnoses and associated orders for this visit:  IBS (irritable bowel syndrome) Continue Bentyl  Patient will get the orange card, she will call back to get GI referral. She may need colonoscopy d/t blood in stools.  Unspecified hypothyroidism - TSH - T4, Free  Return in about 1 week (around 01/19/2014) for Lab Visit-lipid panel.     Chari Manning, [facility] [phone] 01/12/2014, 4:12 PM

## 2014-01-13 LAB — T4, FREE: Free T4: 1.42 ng/dL (ref 0.80–1.80)

## 2014-01-13 LAB — TSH: TSH: 0.855 u[IU]/mL (ref 0.350–4.500)

## 2014-01-14 ENCOUNTER — Telehealth: Payer: Self-pay | Admitting: Internal Medicine

## 2014-01-14 ENCOUNTER — Telehealth: Payer: Self-pay | Admitting: *Deleted

## 2014-01-14 MED ORDER — LEVOTHYROXINE SODIUM 150 MCG PO TABS: 150.0000 ug | ORAL_TABLET | Freq: Every day | ORAL | Status: DC

## 2014-01-14 NOTE — Telephone Encounter (Signed)
Pt is aware of her lab results and I refilled her medication.

## 2014-01-14 NOTE — Telephone Encounter (Signed)
Message copied by Joan Mayans on Thu Jan 14, 2014  2:19 PM ------      Message from: Chari Manning A      Created: Wed Jan 13, 2014  1:59 PM       Let patient know her TSH was normal and that she may continue same dose of synthroid. Please send refill for patient ------

## 2014-01-14 NOTE — Telephone Encounter (Signed)
Pt needs to know test results and refill thyroid medication sent to [facility] Please f/u with Patient

## 2014-01-19 ENCOUNTER — Encounter: Payer: Self-pay | Admitting: *Deleted

## 2014-01-19 ENCOUNTER — Other Ambulatory Visit: Payer: Self-pay

## 2014-01-19 ENCOUNTER — Other Ambulatory Visit: Payer: Self-pay | Admitting: *Deleted

## 2014-01-19 ENCOUNTER — Ambulatory Visit: Payer: Self-pay | Attending: Internal Medicine

## 2014-01-19 DIAGNOSIS — E785 Hyperlipidemia, unspecified: Secondary | ICD-10-CM

## 2014-01-19 DIAGNOSIS — K589 Irritable bowel syndrome without diarrhea: Secondary | ICD-10-CM

## 2014-01-19 LAB — LIPID PANEL
Cholesterol: 190 mg/dL (ref 0–200)
HDL: 39 mg/dL — ABNORMAL LOW (ref >39)
LDL Cholesterol: 119 mg/dL — ABNORMAL HIGH (ref 0–99)
Total CHOL/HDL Ratio: 4.9 Ratio
Triglycerides: 158 mg/dL — ABNORMAL HIGH (ref <150)
VLDL: 32 mg/dL (ref 0–40)

## 2014-01-19 MED ORDER — DICYCLOMINE HCL 20 MG PO TABS: 20.0000 mg | ORAL_TABLET | Freq: Three times a day (TID) | ORAL | Status: DC

## 2014-01-20 ENCOUNTER — Telehealth: Payer: Self-pay | Admitting: *Deleted

## 2014-01-20 NOTE — Telephone Encounter (Signed)
Pt. Returning nurses call for results. Please f/u with pt.

## 2014-01-20 NOTE — Telephone Encounter (Signed)
Left message on VM to return my call. 

## 2014-01-20 NOTE — Telephone Encounter (Signed)
Message copied by Joan Mayans on Wed Jan 20, 2014 10:39 AM ------      Message from: Chari Manning A      Created: Tue Jan 19, 2014 11:30 PM       Cholesterol is slightly elevated. Please educate ------

## 2014-01-21 ENCOUNTER — Telehealth: Payer: Self-pay | Admitting: *Deleted

## 2014-01-21 NOTE — Telephone Encounter (Signed)
Pt is aware of her lab results.  

## 2014-01-27 ENCOUNTER — Emergency Department (HOSPITAL_COMMUNITY)
Admission: EM | Admit: 2014-01-27 | Discharge: 2014-01-27 | Disposition: A | Discharge status: Home / Self Care | Payer: Self-pay | Attending: Family Medicine | Admitting: Family Medicine

## 2014-01-27 ENCOUNTER — Emergency Department (HOSPITAL_COMMUNITY): Payer: Self-pay

## 2014-01-27 ENCOUNTER — Encounter (HOSPITAL_COMMUNITY): Payer: Self-pay | Admitting: Emergency Medicine

## 2014-01-27 DIAGNOSIS — S79929A Unspecified injury of unspecified thigh, initial encounter: Secondary | ICD-10-CM | POA: Insufficient documentation

## 2014-01-27 DIAGNOSIS — J45909 Unspecified asthma, uncomplicated: Secondary | ICD-10-CM | POA: Insufficient documentation

## 2014-01-27 DIAGNOSIS — S79919A Unspecified injury of unspecified hip, initial encounter: Secondary | ICD-10-CM | POA: Insufficient documentation

## 2014-01-27 DIAGNOSIS — Z862 Personal history of diseases of the blood and blood-forming organs and certain disorders involving the immune mechanism: Secondary | ICD-10-CM | POA: Insufficient documentation

## 2014-01-27 DIAGNOSIS — Y9389 Activity, other specified: Secondary | ICD-10-CM | POA: Insufficient documentation

## 2014-01-27 DIAGNOSIS — F17200 Nicotine dependence, unspecified, uncomplicated: Secondary | ICD-10-CM | POA: Insufficient documentation

## 2014-01-27 DIAGNOSIS — Y929 Unspecified place or not applicable: Secondary | ICD-10-CM | POA: Insufficient documentation

## 2014-01-27 DIAGNOSIS — Z91040 Latex allergy status: Secondary | ICD-10-CM | POA: Insufficient documentation

## 2014-01-27 DIAGNOSIS — W010XXA Fall on same level from slipping, tripping and stumbling without subsequent striking against object, initial encounter: Secondary | ICD-10-CM | POA: Insufficient documentation

## 2014-01-27 DIAGNOSIS — F319 Bipolar disorder, unspecified: Secondary | ICD-10-CM | POA: Insufficient documentation

## 2014-01-27 DIAGNOSIS — T07XXXA Unspecified multiple injuries, initial encounter: Secondary | ICD-10-CM | POA: Insufficient documentation

## 2014-01-27 DIAGNOSIS — Z8742 Personal history of other diseases of the female genital tract: Secondary | ICD-10-CM | POA: Insufficient documentation

## 2014-01-27 DIAGNOSIS — E039 Hypothyroidism, unspecified: Secondary | ICD-10-CM | POA: Insufficient documentation

## 2014-01-27 DIAGNOSIS — Z8719 Personal history of other diseases of the digestive system: Secondary | ICD-10-CM | POA: Insufficient documentation

## 2014-01-27 DIAGNOSIS — Z79899 Other long term (current) drug therapy: Secondary | ICD-10-CM | POA: Insufficient documentation

## 2014-01-27 DIAGNOSIS — Z880 Allergy status to penicillin: Secondary | ICD-10-CM | POA: Insufficient documentation

## 2014-01-27 MED ORDER — HYDROCODONE-ACETAMINOPHEN 5-325 MG PO TABS: 1.0000 | ORAL_TABLET | ORAL | Status: DC | PRN

## 2014-01-27 NOTE — Discharge Instructions (Signed)
Ice and medication as needed, activity as tolerated, return as needed

## 2014-01-27 NOTE — ED Provider Notes (Signed)
CSN: [id number]     Arrival date & time 01/27/14  1250 History   First MD Initiated Contact with Patient 01/27/14 1538     Chief Complaint  Patient presents with  . Fall  . Hip Pain     (Consider location/radiation/quality/duration/timing/severity/associated sxs/prior Treatment) Patient is a 43 y.o. female presenting with fall. The history is provided by the patient.  Fall This is a new problem. The current episode started 12 to 24 hours ago (fell down stairs during night, c/o right hip and left shoulder pain., ambulatory.). The problem has been gradually worsening. Pertinent negatives include no chest pain, no abdominal pain and no headaches.    Past Medical History  Diagnosis Date  . Bipolar 1 disorder   . IBS (irritable bowel syndrome)   . Thyroid dysfunction   . Abnormal Pap smear     cryo  . Fibroids   . DUB (dysfunctional uterine bleeding)   . Cyst of breast     left breast  . Hypothyroidism   . Anemia   . Asthma   . QMGNOIBB(048.8)    Past Surgical History  Procedure Laterality Date  . Cholecystectomy    . Tubal ligation    . Colon surgery    . Dilation and curettage of uterus    . Abdominal hysterectomy Bilateral 12/25/2012    Procedure: HYSTERECTOMY ABDOMINAL;  Surgeon: Emily Filbert, MD;  Location: [facility];  Service: Gynecology;  Laterality: Bilateral;  TAH and bilateral salpingectomy  . Bilateral salpingectomy Bilateral 12/25/2012    Procedure: BILATERAL SALPINGECTOMY;  Surgeon: Emily Filbert, MD;  Location: [facility];  Service: Gynecology;  Laterality: Bilateral;  . Oophorectomy Bilateral 12/25/2012    Procedure: OOPHORECTOMY;  Surgeon: Emily Filbert, MD;  Location: [facility];  Service: Gynecology;  Laterality: Bilateral;   Family History  Problem Relation Age of Onset  . Breast cancer Maternal Grandmother   . Heart disease Father   . Hypertension Mother   . Breast cancer Paternal Aunt   . Breast cancer Maternal Aunt   . Rheum arthritis Mother   . Fibromyalgia Mother    . COPD Mother   . Sleep apnea Mother    History  Substance Use Topics  . Smoking status: Current Every Day Smoker -- 1.00 packs/day for 9 years    Types: Cigarettes  . Smokeless tobacco: Never Used  . Alcohol Use: No   OB History   Grav Para Term Preterm Abortions TAB SAB Ect Mult Living   3 2 1 1 1 1    1      Review of Systems  Constitutional: Negative.   HENT: Negative.   Respiratory: Negative.   Cardiovascular: Negative.  Negative for chest pain.  Gastrointestinal: Negative.  Negative for abdominal pain.  Genitourinary: Negative for pelvic pain.  Neurological: Negative for dizziness, weakness and headaches.      Allergies  Latex; Penicillins; and Other  Home Medications   Prior to Admission medications   Medication Sig Start Date End Date Taking? Authorizing Provider  acetaminophen (TYLENOL) 325 MG tablet Take 650 mg by mouth every 6 (six) hours as needed for moderate pain or headache.    Historical Provider, MD  dicyclomine (BENTYL) 20 MG tablet Take 1 tablet (20 mg total) by mouth 4 (four) times daily -  before meals and at bedtime. 01/19/14   Lance Bosch, NP  estradiol (ESTRACE) 1 MG tablet Take 1 tablet (1 mg total) by mouth daily. 04/23/13   [place],  MD  HYDROcodone-acetaminophen (NORCO/VICODIN) 5-325 MG per tablet Take 1 tablet by mouth every 4 (four) hours as needed for moderate pain or severe pain. 01/27/14   Billy Fischer, MD  levothyroxine (SYNTHROID, LEVOTHROID) 150 MCG tablet Take 1 tablet (150 mcg total) by mouth daily before breakfast. 01/14/14   Lance Bosch, NP  lithium carbonate 300 MG capsule Take 300-600 mg by mouth 2 (two) times daily with a meal. Takes 300 mg in the morning at 0800, 300 mg at 1600, and 600 mg at bedtime    Historical Provider, MD   BP 142/94  Pulse 68  Temp(Src) 97.5 F (36.4 C)  Resp 16  Ht 5\' 4"  (1.626 m)  Wt 184 lb (83.462 kg)  BMI 31.57 kg/m2  SpO2 97%  LMP 11/29/2012 Physical Exam  Nursing note and vitals  reviewed. Constitutional: She is oriented to person, place, and time. She appears well-developed and well-nourished. No distress.  HENT:  Head: Normocephalic and atraumatic.  Eyes: EOM are normal. Pupils are equal, round, and reactive to light.  Neck: Normal range of motion. Neck supple.  Pulmonary/Chest: She exhibits no tenderness.  Abdominal: Soft. Bowel sounds are normal. There is no tenderness.  Musculoskeletal: She exhibits tenderness.       Left shoulder: She exhibits tenderness and pain. She exhibits normal range of motion, no swelling, no effusion, no crepitus, no deformity, normal pulse and normal strength.       Right hip: She exhibits tenderness. She exhibits normal range of motion, no bony tenderness, no swelling and no deformity.       Cervical back: Normal.       Lumbar back: Normal.       Arms:      Legs: Lymphadenopathy:    She has no cervical adenopathy.  Neurological: She is alert and oriented to person, place, and time.  Skin: Skin is warm and dry. No rash noted.    ED Course  Procedures (including critical care time) Labs Review Labs Reviewed - No data to display  Imaging Review Dg Hip Complete Right  01/27/2014   CLINICAL DATA:  Status post fall now with severe right hip pain  EXAM: RIGHT HIP - COMPLETE 2+ VIEW  COMPARISON:  None.  FINDINGS: The bony pelvis is adequately mineralized. There is no acute fracture nor dislocation. The observed portions of the sacrum and SI joints are normal. AP and frog leg lateral views of the right hip reveal the joint space be preserved. There is no acute fracture nor dislocation.  IMPRESSION: There is no acute bony abnormality of the right hip or visualized portions of the pelvis.   Electronically Signed   By: David  [place]   On: 01/27/2014 15:17   Dg Shoulder Left  01/27/2014   CLINICAL DATA:  Status post fall now with severe left shoulder pain  EXAM: LEFT SHOULDER - 2+ VIEW  COMPARISON:  None.  FINDINGS: The bones are adequately  mineralized. There is no acute fracture nor dislocation. There is no significant degenerative change. The overlying soft tissues are normal. The observed portions of the left clavicle and upper left ribs are normal.  IMPRESSION: There is no acute bony abnormality of the left shoulder.   Electronically Signed   By: David  [place]   On: 01/27/2014 15:18   X-rays reviewed and report per radiologist.   EKG Interpretation None      MDM   Final diagnoses:  Contusion, multiple sites       Jeneen Rinks  Damian Leavell, MD 01/27/14 [phone]

## 2014-01-27 NOTE — ED Notes (Signed)
Per pt sts she tripped and fell on her right hip and left shoulder. Denies hitting head. sts severe pain in right hip.

## 2014-02-09 ENCOUNTER — Ambulatory Visit: Payer: Self-pay | Attending: Internal Medicine

## 2014-03-30 ENCOUNTER — Ambulatory Visit: Payer: Self-pay | Attending: Internal Medicine | Admitting: *Deleted

## 2014-03-30 DIAGNOSIS — Z8719 Personal history of other diseases of the digestive system: Secondary | ICD-10-CM

## 2014-03-30 DIAGNOSIS — Z23 Encounter for immunization: Secondary | ICD-10-CM | POA: Insufficient documentation

## 2014-03-30 NOTE — Progress Notes (Signed)
Patient ID: Kristen Moore, female   DOB: [birth year], 43 y.o.   MRN: [id number]  Patient presents for flu vaccine and GI referral  Patient states she has an allergy to latex.  States when she wears latex gloves her fingers and hands swell.  Denies SHOB, chest tightness, or throat closure with latex States she's had the flu vaccine for many years without any reaction PCP aware. Flu vaccine given. Patient instructed to head directly to ED or call 911 if any symptoms of SHOB, chest tightness or throat closure occurs. Patient states she has an allergy to bees so is aware of these symptoms and will head to ED if these symptoms occur. Patient does not carry epi-pen.  Patient requesting GI referral for IBS C/o alternating constipation and diarrhea, "bloating", "stomach hurting"  States only taking bentyl bid Instructed to increase bentyl to tid with meals and at bedtime as previously directed. Discussed with PCP and GI referral placed.

## 2014-04-23 ENCOUNTER — Ambulatory Visit (HOSPITAL_COMMUNITY)
Admission: RE | Admit: 2014-04-23 | Discharge: 2014-04-23 | Disposition: A | Discharge status: Home / Self Care | Payer: Self-pay | Source: Ambulatory Visit | Attending: Internal Medicine | Admitting: Internal Medicine

## 2014-04-23 ENCOUNTER — Ambulatory Visit: Payer: Self-pay | Attending: Internal Medicine | Admitting: Internal Medicine

## 2014-04-23 ENCOUNTER — Encounter: Payer: Self-pay | Admitting: Internal Medicine

## 2014-04-23 VITALS — BP 118/80 | HR 98 | Temp 97.9°F | Resp 16 | Ht 64.0 in | Wt 190.0 lb

## 2014-04-23 DIAGNOSIS — W19XXXA Unspecified fall, initial encounter: Secondary | ICD-10-CM

## 2014-04-23 DIAGNOSIS — L659 Nonscarring hair loss, unspecified: Secondary | ICD-10-CM

## 2014-04-23 DIAGNOSIS — J45909 Unspecified asthma, uncomplicated: Secondary | ICD-10-CM | POA: Insufficient documentation

## 2014-04-23 DIAGNOSIS — Z8261 Family history of arthritis: Secondary | ICD-10-CM | POA: Insufficient documentation

## 2014-04-23 DIAGNOSIS — M25561 Pain in right knee: Secondary | ICD-10-CM | POA: Insufficient documentation

## 2014-04-23 DIAGNOSIS — F17210 Nicotine dependence, cigarettes, uncomplicated: Secondary | ICD-10-CM | POA: Insufficient documentation

## 2014-04-23 DIAGNOSIS — M25562 Pain in left knee: Secondary | ICD-10-CM | POA: Insufficient documentation

## 2014-04-23 DIAGNOSIS — Z79899 Other long term (current) drug therapy: Secondary | ICD-10-CM | POA: Insufficient documentation

## 2014-04-23 DIAGNOSIS — K589 Irritable bowel syndrome without diarrhea: Secondary | ICD-10-CM | POA: Insufficient documentation

## 2014-04-23 DIAGNOSIS — E039 Hypothyroidism, unspecified: Secondary | ICD-10-CM | POA: Insufficient documentation

## 2014-04-23 DIAGNOSIS — W109XXA Fall (on) (from) unspecified stairs and steps, initial encounter: Secondary | ICD-10-CM | POA: Insufficient documentation

## 2014-04-23 DIAGNOSIS — F319 Bipolar disorder, unspecified: Secondary | ICD-10-CM | POA: Insufficient documentation

## 2014-04-23 NOTE — Progress Notes (Signed)
Patient ID: Kristen Moore, female   DOB: [birth year], 43 y.o.   MRN: [id number]  CC: fall, hair loss  HPI:  Patient presents to clinic today with a history of hypothyroidism.  She is concerned about lossing her hair that has been going on since "a while". She reports that she is going through a lot of stress due to her dad being recently diagnosed with non small cell carcinoma. She states that cancer runs in her family as well as diabetes.   [place] three days ago on her knees from stairs.  Landed on left knee, did not seek evaluation at the time of the fall. She notes that she has gained weight and is unsure if that is the cause of her knee pain.  She reports that she has was already having pain in her knees and the fall has exacerbated the pain more.  The pain is aggravated by walking and getting up from sitting for long periods of time.  She has a family history of RA in her mom.   Allergies  Allergen Reactions  . Latex Swelling  . Penicillins Swelling  . Other Hives and Swelling    Potatoes      Past Medical History  Diagnosis Date  . Bipolar 1 disorder   . IBS (irritable bowel syndrome)   . Thyroid dysfunction   . Abnormal Pap smear     cryo  . Fibroids   . DUB (dysfunctional uterine bleeding)   . Cyst of breast     left breast  . Hypothyroidism   . Anemia   . Asthma   . DUKGURKY(706.2)    Current Outpatient Prescriptions on File Prior to Visit  Medication Sig Dispense Refill  . acetaminophen (TYLENOL) 325 MG tablet Take 650 mg by mouth every 6 (six) hours as needed for moderate pain or headache.    . dicyclomine (BENTYL) 20 MG tablet Take 20 mg by mouth 4 (four) times daily -  before meals and at bedtime.    Marland Kitchen estradiol (ESTRACE) 1 MG tablet Take 1 tablet (1 mg total) by mouth daily. 30 tablet 11  . levothyroxine (SYNTHROID, LEVOTHROID) 150 MCG tablet Take 150 mcg by mouth daily before breakfast.    . lithium carbonate 300 MG capsule Take 300-600 mg by mouth 2 (two) times  daily with a meal. Takes 300 mg in the morning at 0800, 300 mg at 1600, and 600 mg at bedtime    . HYDROcodone-acetaminophen (NORCO/VICODIN) 5-325 MG per tablet Take 1 tablet by mouth every 4 (four) hours as needed for moderate pain or severe pain. 15 tablet 0   No current facility-administered medications on file prior to visit.   Family History  Problem Relation Age of Onset  . Breast cancer Maternal Grandmother   . Heart disease Father   . Cancer Father 52    lung  . Hypertension Mother   . Rheum arthritis Mother   . Fibromyalgia Mother   . COPD Mother   . Sleep apnea Mother   . Breast cancer Paternal Aunt   . Breast cancer Maternal Aunt    History   Social History  . Marital Status: Single    Spouse Name: N/A    Number of Children: N/A  . Years of Education: N/A   Occupational History  . Not on file.   Social History Main Topics  . Smoking status: Current Every Day Smoker -- 1.00 packs/day for 9 years    Types: Cigarettes  .  Smokeless tobacco: Never Used  . Alcohol Use: No  . Drug Use: No  . Sexual Activity: Not Currently    Birth Control/ Protection: Surgical   Other Topics Concern  . Not on file   Social History Narrative    Review of Systems: See hPI  Objective:   Filed Vitals:   04/23/14 1106  BP: 118/80  Pulse: 98  Temp: 97.9 F (36.6 C)  Resp: 16    Physical Exam  Constitutional: She is oriented to person, place, and time.  HENT:  Large patch of thin hair at crown of head  Cardiovascular: Normal rate, regular rhythm and normal heart sounds.   Pulmonary/Chest: Effort normal and breath sounds normal.  Musculoskeletal: Normal range of motion. She exhibits no edema or tenderness.  Neurological: She is alert and oriented to person, place, and time.  Skin: Skin is warm and dry. No erythema.     Lab Results  Component Value Date   WBC 4.4 12/26/2013   HGB 14.4 12/26/2013   HCT 42.5 12/26/2013   MCV 96.4 12/26/2013   PLT 192 12/26/2013    Lab Results  Component Value Date   CREATININE 0.82 12/26/2013   BUN 12 12/26/2013   NA 139 12/26/2013   K 3.8 12/26/2013   CL 104 12/26/2013   CO2 22 12/26/2013    No results found for: HGBA1C Lipid Panel     Component Value Date/Time   CHOL 190 01/19/2014 0911   TRIG 158* 01/19/2014 0911   HDL 39* 01/19/2014 0911   CHOLHDL 4.9 01/19/2014 0911   VLDL 32 01/19/2014 0911   LDLCALC 119* 01/19/2014 0911       Assessment and plan:   Mersadez was seen today for follow-up.  Diagnoses and associated orders for this visit:  Hypothyroidism, unspecified hypothyroidism type - TSH--to r/o cause of hair loss  Hair loss Recommended Biotin for hair loss   Fall, initial encounter - DG Knee Complete 4 Views Right; Future  Family history of rheumatoid arthritis - Rheumatoid factor - ANA   Return if symptoms worsen or fail to improve.        Chari Manning, [facility] [phone] 04/23/2014, 11:22 AM

## 2014-04-23 NOTE — Progress Notes (Signed)
Pt is concerned about her thyroid because she is loosing her hair. Pt is also having pain in her left knee due to a recent fall. She needs a referral to GI doctor.

## 2014-04-23 NOTE — Patient Instructions (Signed)
Knee Exercises EXERCISES RANGE OF MOTION (ROM) AND STRETCHING EXERCISES These exercises may help you when beginning to rehabilitate your injury. Your symptoms may resolve with or without further involvement from your physician, physical therapist, or athletic trainer. While completing these exercises, remember:   Restoring tissue flexibility helps normal motion to return to the joints. This allows healthier, less painful movement and activity.  An effective stretch should be held for at least 30 seconds.  A stretch should never be painful. You should only feel a gentle lengthening or release in the stretched tissue. STRETCH - Knee Extension, Prone  Lie on your stomach on a firm surface, such as a bed or countertop. Place your right / left knee and leg just beyond the edge of the surface. You may wish to place a towel under the far end of your right / left thigh for comfort.  Relax your leg muscles and allow gravity to straighten your knee. Your clinician may advise you to add an ankle weight if more resistance is helpful for you.  You should feel a stretch in the back of your right / left knee. Hold this position for __________ seconds. Repeat __________ times. Complete this stretch __________ times per day. * Your physician, physical therapist, or athletic trainer may ask you to add ankle weight to enhance your stretch.  RANGE OF MOTION - Knee Flexion, Active  Lie on your back with both knees straight. (If this causes back discomfort, bend your opposite knee, placing your foot flat on the floor.)  Slowly slide your heel back toward your buttocks until you feel a gentle stretch in the front of your knee or thigh.  Hold for __________ seconds. Slowly slide your heel back to the starting position. Repeat __________ times. Complete this exercise __________ times per day.  STRETCH - Quadriceps, Prone   Lie on your stomach on a firm surface, such as a bed or padded floor.  Bend your right /  left knee and grasp your ankle. If you are unable to reach your ankle or pant leg, use a belt around your foot to lengthen your reach.  Gently pull your heel toward your buttocks. Your knee should not slide out to the side. You should feel a stretch in the front of your thigh and/or knee.  Hold this position for __________ seconds. Repeat __________ times. Complete this stretch __________ times per day.  STRETCH - Hamstrings, Supine   Lie on your back. Loop a belt or towel over the ball of your right / left foot.  Straighten your right / left knee and slowly pull on the belt to raise your leg. Do not allow the right / left knee to bend. Keep your opposite leg flat on the floor.  Raise the leg until you feel a gentle stretch behind your right / left knee or thigh. Hold this position for __________ seconds. Repeat __________ times. Complete this stretch __________ times per day.  STRENGTHENING EXERCISES These exercises may help you when beginning to rehabilitate your injury. They may resolve your symptoms with or without further involvement from your physician, physical therapist, or athletic trainer. While completing these exercises, remember:   Muscles can gain both the endurance and the strength needed for everyday activities through controlled exercises.  Complete these exercises as instructed by your physician, physical therapist, or athletic trainer. Progress the resistance and repetitions only as guided.  You may experience muscle soreness or fatigue, but the pain or discomfort you are trying to eliminate   should never worsen during these exercises. If this pain does worsen, stop and make certain you are following the directions exactly. If the pain is still present after adjustments, discontinue the exercise until you can discuss the trouble with your clinician. STRENGTH - Quadriceps, Isometrics  Lie on your back with your right / left leg extended and your opposite knee  bent.  Gradually tense the muscles in the front of your right / left thigh. You should see either your knee cap slide up toward your hip or increased dimpling just above the knee. This motion will push the back of the knee down toward the floor/mat/bed on which you are lying.  Hold the muscle as tight as you can without increasing your pain for __________ seconds.  Relax the muscles slowly and completely in between each repetition. Repeat __________ times. Complete this exercise __________ times per day.  STRENGTH - Quadriceps, Short Arcs   Lie on your back. Place a __________ inch towel roll under your knee so that the knee slightly bends.  Raise only your lower leg by tightening the muscles in the front of your thigh. Do not allow your thigh to rise.  Hold this position for __________ seconds. Repeat __________ times. Complete this exercise __________ times per day.  OPTIONAL ANKLE WEIGHTS: Begin with ____________________, but DO NOT exceed ____________________. Increase in 1 pound/0.5 kilogram increments.  STRENGTH - Quadriceps, Straight Leg Raises  Quality counts! Watch for signs that the quadriceps muscle is working to insure you are strengthening the correct muscles and not "cheating" by substituting with healthier muscles.  Lay on your back with your right / left leg extended and your opposite knee bent.  Tense the muscles in the front of your right / left thigh. You should see either your knee cap slide up or increased dimpling just above the knee. Your thigh may even quiver.  Tighten these muscles even more and raise your leg 4 to 6 inches off the floor. Hold for __________ seconds.  Keeping these muscles tense, lower your leg.  Relax the muscles slowly and completely in between each repetition. Repeat __________ times. Complete this exercise __________ times per day.  STRENGTH - Hamstring, Curls  Lay on your stomach with your legs extended. (If you lay on a bed, your feet  may hang over the edge.)  Tighten the muscles in the back of your thigh to bend your right / left knee up to 90 degrees. Keep your hips flat on the bed/floor.  Hold this position for __________ seconds.  Slowly lower your leg back to the starting position. Repeat __________ times. Complete this exercise __________ times per day.  OPTIONAL ANKLE WEIGHTS: Begin with ____________________, but DO NOT exceed ____________________. Increase in 1 pound/0.5 kilogram increments.  STRENGTH - Quadriceps, Squats  Stand in a door frame so that your feet and knees are in line with the frame.  Use your hands for balance, not support, on the frame.  Slowly lower your weight, bending at the hips and knees. Keep your lower legs upright so that they are parallel with the door frame. Squat only within the range that does not increase your knee pain. Never let your hips drop below your knees.  Slowly return upright, pushing with your legs, not pulling with your hands. Repeat __________ times. Complete this exercise __________ times per day.  STRENGTH - Quadriceps, Wall Slides  Follow guidelines for form closely. Increased knee pain often results from poorly placed feet or knees.  Lean   against a smooth wall or door and walk your feet out 18-24 inches. Place your feet hip-width apart.  Slowly slide down the wall or door until your knees bend __________ degrees.* Keep your knees over your heels, not your toes, and in line with your hips, not falling to either side.  Hold for __________ seconds. Stand up to rest for __________ seconds in between each repetition. Repeat __________ times. Complete this exercise __________ times per day. * Your physician, physical therapist, or athletic trainer will alter this angle based on your symptoms and progress. Document Released: 04/04/2005 Document Revised: 10/05/2013 Document Reviewed: 09/02/2008 Exit[employer] Patient Information 2015 [employer]. This information is not  intended to replace advice given to you by your health care provider. Make sure you discuss any questions you have with your health care provider.  

## 2014-04-24 LAB — RHEUMATOID FACTOR: Rhuematoid fact SerPl-aCnc: <10 IU/mL (ref <=14)

## 2014-04-24 LAB — TSH: TSH: 0.578 u[IU]/mL (ref 0.350–4.500)

## 2014-04-26 ENCOUNTER — Telehealth: Payer: Self-pay | Admitting: Internal Medicine

## 2014-04-26 LAB — ANA: Anti Nuclear Antibody(ANA): NEGATIVE

## 2014-04-26 NOTE — Telephone Encounter (Signed)
Pt is calling to inquire about her results. Please follow up with pt.  °

## 2014-04-27 ENCOUNTER — Telehealth: Payer: Self-pay | Admitting: *Deleted

## 2014-04-27 NOTE — Telephone Encounter (Signed)
Lab and x-ray results reviewed with patient.

## 2014-04-27 NOTE — Telephone Encounter (Signed)
-----   Message from Lance Bosch, NP sent at 04/26/2014 11:11 PM EST ----- Thyroid level is normal patient negative for rheumatoid arthritis.

## 2014-04-27 NOTE — Telephone Encounter (Signed)
Message left with man who answered phone for patient to return call to discuss test results

## 2014-04-27 NOTE — Telephone Encounter (Signed)
Left message informing her that her labs are normal.

## 2014-04-27 NOTE — Telephone Encounter (Signed)
-----   Message from Lance Bosch, NP sent at 04/25/2014  7:46 PM EST ----- Degenerative changes found on xray. Otherwise normal

## 2014-04-27 NOTE — Telephone Encounter (Signed)
Pt calling regarding for results, also left alternate number: [phone].

## 2014-05-03 ENCOUNTER — Ambulatory Visit (INDEPENDENT_AMBULATORY_CARE_PROVIDER_SITE_OTHER): Payer: Self-pay | Admitting: Obstetrics & Gynecology

## 2014-05-03 ENCOUNTER — Encounter: Payer: Self-pay | Admitting: Obstetrics & Gynecology

## 2014-05-03 VITALS — BP 127/82 | HR 64 | Temp 97.6°F | Ht 64.0 in | Wt 187.5 lb

## 2014-05-03 DIAGNOSIS — L659 Nonscarring hair loss, unspecified: Secondary | ICD-10-CM

## 2014-05-03 DIAGNOSIS — N898 Other specified noninflammatory disorders of vagina: Secondary | ICD-10-CM

## 2014-05-03 DIAGNOSIS — Z0000 Encounter for general adult medical examination without abnormal findings: Secondary | ICD-10-CM

## 2014-05-03 NOTE — Progress Notes (Signed)
Subjective:    Kristen Moore is a 43 y.o. SW P33 (61 yo daughter) female who presents for an annual exam. The patient has no complaints today. She is complaining of hair loss. She had a TSH checked a few weeks ago and it was normal. She tried Rogaine about a year ago and says that it did not help.The patient is not currently sexually active. GYN screening history: last pap: was normal. The patient wears seatbelts: yes. The patient participates in regular exercise: yes. Has the patient ever been transfused or tattooed?: no. The patient reports that there is not domestic violence in her life.   Menstrual History: OB History    Gravida Para Term Preterm AB TAB SAB Ectopic Multiple Living   3 2 1 1 1 1    1       Menarche age: 60  Patient's last menstrual period was 11/29/2012.    The following portions of the patient's history were reviewed and updated as appropriate: allergies, current medications, past family history, past medical history, past social history, past surgical history and problem list.  Review of Systems A comprehensive review of systems was negative. 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 Mammogram and flu vaccine UTD. [facility].   Objective:    BP 127/82 mmHg  Pulse 64  Temp(Src) 97.6 F (36.4 C) (Oral)  Ht 5\' 4"  (1.626 m)  Wt 187 lb 8 oz (85.049 kg)  BMI 32.17 kg/m2  LMP 11/29/2012  General Appearance:    Alert, cooperative, no distress, appears stated age  Head:    Normocephalic, without obvious abnormality, atraumatic, Her hair does seem significantly thinner than most  Eyes:    PERRL, conjunctiva/corneas clear, EOM's intact, fundi    benign, both eyes  Ears:    Normal TM's and external ear canals, both ears  Nose:   Nares normal, septum midline, mucosa normal, no drainage    or sinus tenderness  Throat:   Lips, mucosa, and tongue normal; teeth and gums normal  Neck:   Supple, symmetrical, trachea midline, no  adenopathy;    thyroid:  no enlargement/tenderness/nodules; no carotid   bruit or JVD  Back:     Symmetric, no curvature, ROM normal, no CVA tenderness  Lungs:     Clear to auscultation bilaterally, respirations unlabored  Chest Wall:    No tenderness or deformity   Heart:    Regular rate and rhythm, S1 and S2 normal, no murmur, rub   or gallop  Breast Exam:    No tenderness, masses, or nipple abnormality  Abdomen:     Soft, non-tender, bowel sounds active all four quadrants,    no masses, no organomegaly  Genitalia:    Normal female without lesion, discharge or tenderness, frothy white discharge (She has had BV in the past), No masses palpable with bimanual exam     Extremities:   Extremities normal, atraumatic, no cyanosis or edema  Pulses:   2+ and symmetric all extremities  Skin:   Skin color, texture, turgor normal, no rashes or lesions  Lymph nodes:   Cervical, supraclavicular, and axillary nodes normal  Neurologic:   CNII-XII intact, normal strength, sensation and reflexes    throughout  .    Assessment:    Healthy female exam.   Thinning hair Vaginal discharge   Plan:     Breast self exam technique reviewed and patient encouraged to perform self-exam monthly.   Annual mammogram Referral  to dermatology Send wet prep

## 2014-05-04 LAB — WET PREP, GENITAL
Trich, Wet Prep: NONE SEEN
Yeast Wet Prep HPF POC: NONE SEEN

## 2014-05-10 ENCOUNTER — Telehealth: Payer: Self-pay | Admitting: *Deleted

## 2014-05-10 DIAGNOSIS — B9689 Other specified bacterial agents as the cause of diseases classified elsewhere: Secondary | ICD-10-CM

## 2014-05-10 DIAGNOSIS — N760 Acute vaginitis: Secondary | ICD-10-CM

## 2014-05-10 MED ORDER — METRONIDAZOLE 500 MG PO TABS: 500.0000 mg | ORAL_TABLET | Freq: Two times a day (BID) | ORAL | Status: DC

## 2014-05-10 NOTE — Telephone Encounter (Signed)
Kristen Moore left a message on voicemail stating she had a visit 05/03/14 and hasn't received any phonecall re: results and if needs antibiotics.  Called Kristen Moore and informed her wet prep shows BV and will send rx for flagyl to her pharmacy.  Kristen Moore voices understanding.

## 2014-05-12 ENCOUNTER — Other Ambulatory Visit: Payer: Self-pay | Admitting: Obstetrics & Gynecology

## 2014-05-12 ENCOUNTER — Telehealth: Payer: Self-pay

## 2014-05-12 ENCOUNTER — Telehealth: Payer: Self-pay | Admitting: General Practice

## 2014-05-12 DIAGNOSIS — Z90710 Acquired absence of both cervix and uterus: Secondary | ICD-10-CM

## 2014-05-12 DIAGNOSIS — E8940 Asymptomatic postprocedural ovarian failure: Secondary | ICD-10-CM

## 2014-05-12 MED ORDER — ESTRADIOL 1 MG PO TABS: 1.0000 mg | ORAL_TABLET | Freq: Every day | ORAL | Status: DC

## 2014-05-12 NOTE — Telephone Encounter (Signed)
Patient called and left message requesting refill of estradiol. Discussed with Dr. Hulan Fray who agreed to give patient enough refills for the next 2 years (1 pack with 23 refills). Medication e-prescribed. Called patient and left message informing her medication has been refilled and sent to pharmacy, call clinic with questions or concerns.

## 2014-05-12 NOTE — Telephone Encounter (Signed)
Patient called and left message stating she wants to know if we got the prescription sent in to her [facility]. Called patient back and informed her medication had been sent there. Patient verbalized understanding and had no other questions

## 2014-05-17 ENCOUNTER — Ambulatory Visit: Payer: Self-pay

## 2014-05-20 ENCOUNTER — Telehealth: Payer: Self-pay

## 2014-05-20 NOTE — Telephone Encounter (Signed)
Spoke with Dr Hulan Fray, patient never would've been on 5mg , can decrease dosage to 0.5mg . Called patient, no answer- left message stating we are trying to return your phone call, please call us back at the clinics

## 2014-05-20 NOTE — Telephone Encounter (Signed)
Patient called nurse line stating she was seen on 05/03/14 by Dr. Hulan Fray and was given RX for Estrace 1mg . Reports she used to be on Estadiol 5mg . She is unsure if this was an error on Dr. Alease Medina part but states she has been having bad headaches and dizzy spells while taking the 1mg . Would like to know if she can have RX for 5mg .

## 2014-05-24 NOTE — Telephone Encounter (Signed)
Called patient and she states that she fixed the problem herself. She just started taking the medication at bedtime and the headaches and dizziness went away. Patient had no questions

## 2014-08-12 ENCOUNTER — Ambulatory Visit: Payer: Self-pay | Admitting: Internal Medicine

## 2014-09-08 ENCOUNTER — Encounter: Payer: Self-pay | Admitting: Internal Medicine

## 2014-09-08 ENCOUNTER — Ambulatory Visit: Payer: Self-pay | Attending: Internal Medicine | Admitting: Internal Medicine

## 2014-09-08 VITALS — BP 135/91 | HR 60 | Temp 97.5°F | Resp 16 | Ht 64.0 in | Wt 190.0 lb

## 2014-09-08 DIAGNOSIS — R599 Enlarged lymph nodes, unspecified: Secondary | ICD-10-CM

## 2014-09-08 DIAGNOSIS — R229 Localized swelling, mass and lump, unspecified: Secondary | ICD-10-CM

## 2014-09-08 DIAGNOSIS — Z720 Tobacco use: Secondary | ICD-10-CM

## 2014-09-08 DIAGNOSIS — R591 Generalized enlarged lymph nodes: Secondary | ICD-10-CM

## 2014-09-08 DIAGNOSIS — R222 Localized swelling, mass and lump, trunk: Secondary | ICD-10-CM | POA: Insufficient documentation

## 2014-09-08 NOTE — Patient Instructions (Signed)
I will have Kristen Moore call you tomorrow so that he can schedule you a CT scan of the chest.

## 2014-09-08 NOTE — Progress Notes (Signed)
Patient ID: Kristen Moore, female   DOB: 23-Oct-1970, 44 y.o.   MRN: [id number] CC: chest nodule   HPI: Kristen Moore is a 44 y.o. female here today for a follow up visit.  Patient has past medical history of bipolar disorder and thyroid disease.  She reports that she has had a nodule in the center of her chest for the past one year. Two days ago she attempted to squeeze the knot but it became bigger and very painful. She has had symptoms of night sweats, hot flashes, headaches, chest tenderness, and fatigue. Patient has been a 1 ppd smoker for 11 years.    Allergies  Allergen Reactions  . Latex Swelling  . Penicillins Swelling  . Other Hives and Swelling    Potatoes      Past Medical History  Diagnosis Date  . Bipolar 1 disorder   . IBS (irritable bowel syndrome)   . Thyroid dysfunction   . Abnormal Pap smear     cryo  . Fibroids   . DUB (dysfunctional uterine bleeding)   . Cyst of breast     left breast  . Hypothyroidism   . Anemia   . Asthma   . XKGYJEHU(314.9)    Current Outpatient Prescriptions on File Prior to Visit  Medication Sig Dispense Refill  . acetaminophen (TYLENOL) 325 MG tablet Take 650 mg by mouth every 6 (six) hours as needed for moderate pain or headache.    . estradiol (ESTRACE) 1 MG tablet Take 1 tablet (1 mg total) by mouth daily. 30 tablet 23  . Fish Oil OIL Take 1 tablet by mouth.    . levothyroxine (SYNTHROID, LEVOTHROID) 150 MCG tablet Take 150 mcg by mouth daily before breakfast.    . lithium carbonate 300 MG capsule Take 300-600 mg by mouth 2 (two) times daily with a meal. Takes 300 mg in the morning at 0800, 300 mg at 1600, and 600 mg at bedtime    . Brexpiprazole (REXULTI) 0.5 MG TABS Take by mouth.    . dicyclomine (BENTYL) 20 MG tablet Take 20 mg by mouth 4 (four) times daily -  before meals and at bedtime.    . metroNIDAZOLE (FLAGYL) 500 MG tablet Take 1 tablet (500 mg total) by mouth 2 (two) times daily. (Patient not taking: Reported  on 09/08/2014) 14 tablet 0   No current facility-administered medications on file prior to visit.   Family History  Problem Relation Age of Onset  . Breast cancer Maternal Grandmother   . Heart disease Father   . Cancer Father 69    lung  . Hypertension Mother   . Rheum arthritis Mother   . Fibromyalgia Mother   . COPD Mother   . Sleep apnea Mother   . Breast cancer Paternal Aunt   . Breast cancer Maternal Aunt    History   Social History  . Marital Status: Single    Spouse Name: N/A  . Number of Children: N/A  . Years of Education: N/A   Occupational History  . Not on file.   Social History Main Topics  . Smoking status: Current Every Day Smoker -- 1.00 packs/day for 9 years    Types: Cigarettes  . Smokeless tobacco: Never Used  . Alcohol Use: No  . Drug Use: No  . Sexual Activity: Not Currently    Birth Control/ Protection: Surgical   Other Topics Concern  . Not on file   Social History Narrative    Review of  Systems: See HPI    Objective:   Filed Vitals:   09/08/14 1611  BP: 135/91  Pulse: 60  Temp: 97.5 F (36.4 C)  Resp: 16    Physical Exam  Constitutional: She is oriented to person, place, and time. She appears well-developed and well-nourished. No distress.  Neck:    Enlarged lymph along traced area. Tender to touch  Cardiovascular: Normal rate, regular rhythm and normal heart sounds.   Pulmonary/Chest: Effort normal and breath sounds normal.    Genitourinary: No breast discharge.  Lymphadenopathy:    She has cervical adenopathy.  Neurological: She is alert and oriented to person, place, and time.  Vitals reviewed.    Lab Results  Component Value Date   WBC 6.7 09/08/2014   HGB 15.5* 09/08/2014   HCT 45.5 09/08/2014   MCV 94.6 09/08/2014   PLT 211 09/08/2014   Lab Results  Component Value Date   CREATININE 0.72 09/08/2014   BUN 11 09/08/2014   NA 139 09/08/2014   K 5.0 09/08/2014   CL 104 09/08/2014   CO2 27 09/08/2014      No results found for: HGBA1C Lipid Panel     Component Value Date/Time   CHOL 190 01/19/2014 0911   TRIG 158* 01/19/2014 0911   HDL 39* 01/19/2014 0911   CHOLHDL 4.9 01/19/2014 0911   VLDL 32 01/19/2014 0911   LDLCALC 119* 01/19/2014 0911       Assessment and plan:   Enrique was seen today for abscess.  Diagnoses and all orders for this visit:  Subcutaneous nodule of chest wall Orders: -     CT Chest W Contrast; Future -     COMPLETE METABOLIC PANEL WITH GFR -     CBC with Differential Will get additional test to see if nodule is benign. I do have concerns for cancerous process due to patients history  Lymph nodes enlarged Patient has not been sick. Will get additional test  Tobacco abuse Smoking cessation discussed for 3 minutes, patient is not willing to quit at this time. Will continue to assess on each visit. Discussed increased risk for diseases such as cancer, heart disease, and stroke.  I have strongly encouraged patient to quit smoking asap  Return for pending results of CT scan.       Chari Manning, NP-C [facility] [phone] 09/12/2014, 8:38 PM

## 2014-09-08 NOTE — Progress Notes (Signed)
Patient here with quarter nickle sized riased, painful area in the  Middle of her chest that has been there for about a year.

## 2014-09-09 LAB — CBC WITH DIFFERENTIAL/PLATELET
Basophils Absolute: 0 10*3/uL (ref 0.0–0.1)
Basophils Relative: 0 % (ref 0–1)
Eosinophils Absolute: 0.1 10*3/uL (ref 0.0–0.7)
Eosinophils Relative: 2 % (ref 0–5)
HCT: 45.5 % (ref 36.0–46.0)
Hemoglobin: 15.5 g/dL — ABNORMAL HIGH (ref 12.0–15.0)
Lymphocytes Relative: 27 % (ref 12–46)
Lymphs Abs: 1.8 10*3/uL (ref 0.7–4.0)
MCH: 32.2 pg (ref 26.0–34.0)
MCHC: 34.1 g/dL (ref 30.0–36.0)
MCV: 94.6 fL (ref 78.0–100.0)
MPV: 10.9 fL (ref 8.6–12.4)
Monocytes Absolute: 0.4 10*3/uL (ref 0.1–1.0)
Monocytes Relative: 6 % (ref 3–12)
Neutro Abs: 4.4 10*3/uL (ref 1.7–7.7)
Neutrophils Relative %: 65 % (ref 43–77)
Platelets: 211 10*3/uL (ref 150–400)
RBC: 4.81 MIL/uL (ref 3.87–5.11)
RDW: 12.8 % (ref 11.5–15.5)
WBC: 6.7 10*3/uL (ref 4.0–10.5)

## 2014-09-09 LAB — COMPLETE METABOLIC PANEL WITH GFR
ALT: 15 U/L (ref 0–35)
AST: 16 U/L (ref 0–37)
Albumin: 4.6 g/dL (ref 3.5–5.2)
Alkaline Phosphatase: 55 U/L (ref 39–117)
BUN: 11 mg/dL (ref 6–23)
CO2: 27 mEq/L (ref 19–32)
Calcium: 9.7 mg/dL (ref 8.4–10.5)
Chloride: 104 mEq/L (ref 96–112)
Creat: 0.72 mg/dL (ref 0.50–1.10)
GFR, Est African American: >89 mL/min
GFR, Est Non African American: >89 mL/min
Glucose, Bld: 88 mg/dL (ref 70–99)
Potassium: 5 mEq/L (ref 3.5–5.3)
Sodium: 139 mEq/L (ref 135–145)
Total Bilirubin: 0.6 mg/dL (ref 0.2–1.2)
Total Protein: 7.1 g/dL (ref 6.0–8.3)

## 2014-09-14 ENCOUNTER — Ambulatory Visit (HOSPITAL_COMMUNITY)
Admission: RE | Admit: 2014-09-14 | Discharge: 2014-09-14 | Disposition: A | Discharge status: Home / Self Care | Payer: Self-pay | Source: Ambulatory Visit | Attending: Internal Medicine | Admitting: Internal Medicine

## 2014-09-14 ENCOUNTER — Encounter (HOSPITAL_COMMUNITY): Payer: Self-pay

## 2014-09-14 DIAGNOSIS — F17210 Nicotine dependence, cigarettes, uncomplicated: Secondary | ICD-10-CM | POA: Insufficient documentation

## 2014-09-14 DIAGNOSIS — R222 Localized swelling, mass and lump, trunk: Secondary | ICD-10-CM | POA: Insufficient documentation

## 2014-09-14 DIAGNOSIS — R61 Generalized hyperhidrosis: Secondary | ICD-10-CM | POA: Insufficient documentation

## 2014-09-14 DIAGNOSIS — R51 Headache: Secondary | ICD-10-CM | POA: Insufficient documentation

## 2014-09-14 DIAGNOSIS — Z720 Tobacco use: Secondary | ICD-10-CM

## 2014-09-14 DIAGNOSIS — R5383 Other fatigue: Secondary | ICD-10-CM | POA: Insufficient documentation

## 2014-09-14 DIAGNOSIS — R599 Enlarged lymph nodes, unspecified: Secondary | ICD-10-CM

## 2014-09-14 MED ORDER — IOHEXOL 300 MG/ML  SOLN
80.0000 mL | Freq: Once | INTRAMUSCULAR | Status: AC | PRN
  Administered 2014-09-14: 80 mL via INTRAVENOUS

## 2014-09-16 ENCOUNTER — Telehealth: Payer: Self-pay | Admitting: *Deleted

## 2014-09-16 NOTE — Telephone Encounter (Signed)
Patient called in to say she had not gotten the results of her recent CT of chest.  Read patient Kristen Moore result note.  Patient states she cannot afford to have cyst removed.  Patient thankful for the call and had no further questions.

## 2014-09-17 ENCOUNTER — Encounter (HOSPITAL_COMMUNITY): Payer: Self-pay | Admitting: *Deleted

## 2014-09-17 ENCOUNTER — Emergency Department (HOSPITAL_COMMUNITY)
Admission: EM | Admit: 2014-09-17 | Discharge: 2014-09-17 | Disposition: A | Discharge status: Home / Self Care | Payer: Self-pay | Attending: Emergency Medicine | Admitting: Emergency Medicine

## 2014-09-17 ENCOUNTER — Other Ambulatory Visit: Payer: Self-pay

## 2014-09-17 DIAGNOSIS — Z720 Tobacco use: Secondary | ICD-10-CM | POA: Insufficient documentation

## 2014-09-17 DIAGNOSIS — Z862 Personal history of diseases of the blood and blood-forming organs and certain disorders involving the immune mechanism: Secondary | ICD-10-CM | POA: Insufficient documentation

## 2014-09-17 DIAGNOSIS — J45909 Unspecified asthma, uncomplicated: Secondary | ICD-10-CM | POA: Insufficient documentation

## 2014-09-17 DIAGNOSIS — Z803 Family history of malignant neoplasm of breast: Secondary | ICD-10-CM

## 2014-09-17 DIAGNOSIS — Z8719 Personal history of other diseases of the digestive system: Secondary | ICD-10-CM | POA: Insufficient documentation

## 2014-09-17 DIAGNOSIS — Z880 Allergy status to penicillin: Secondary | ICD-10-CM | POA: Insufficient documentation

## 2014-09-17 DIAGNOSIS — N6082 Other benign mammary dysplasias of left breast: Secondary | ICD-10-CM | POA: Insufficient documentation

## 2014-09-17 DIAGNOSIS — F319 Bipolar disorder, unspecified: Secondary | ICD-10-CM | POA: Insufficient documentation

## 2014-09-17 DIAGNOSIS — Z8742 Personal history of other diseases of the female genital tract: Secondary | ICD-10-CM | POA: Insufficient documentation

## 2014-09-17 DIAGNOSIS — E039 Hypothyroidism, unspecified: Secondary | ICD-10-CM | POA: Insufficient documentation

## 2014-09-17 DIAGNOSIS — Z79899 Other long term (current) drug therapy: Secondary | ICD-10-CM | POA: Insufficient documentation

## 2014-09-17 DIAGNOSIS — Z91040 Latex allergy status: Secondary | ICD-10-CM | POA: Insufficient documentation

## 2014-09-17 MED ORDER — SULFAMETHOXAZOLE-TRIMETHOPRIM 800-160 MG PO TABS: 1.0000 | ORAL_TABLET | Freq: Two times a day (BID) | ORAL | Status: DC

## 2014-09-17 MED ORDER — HYDROCODONE-ACETAMINOPHEN 5-325 MG PO TABS
1.0000 | ORAL_TABLET | Freq: Once | ORAL | Status: AC
  Administered 2014-09-17: 1 via ORAL
  Filled 2014-09-17: qty 1

## 2014-09-17 MED ORDER — SULFAMETHOXAZOLE-TRIMETHOPRIM 800-160 MG PO TABS
1.0000 | ORAL_TABLET | Freq: Once | ORAL | Status: AC
  Administered 2014-09-17: 1 via ORAL
  Filled 2014-09-17: qty 1

## 2014-09-17 MED ORDER — HYDROCODONE-ACETAMINOPHEN 5-325 MG PO TABS
ORAL_TABLET | ORAL | Status: DC
Start: 2014-09-17 — End: 2014-09-24

## 2014-09-17 NOTE — ED Notes (Signed)
Patient staets she has noticed a bump between her breast for a year.  She states for the past 2 weeks the area got larger.  She has tried to mash the area and states it has continued to get larger.  Patient states the area has an odor and green discharge.

## 2014-09-17 NOTE — Discharge Instructions (Signed)
Take vicodin for breakthrough pain, do not drink alcohol, drive, care for children or do other critical tasks while taking vicodin.  If you see signs of infection (warmth, redness, tenderness, pus, sharp increase in pain, fever, red streaking) immediately return to the emergency department.  Please follow with your primary care doctor in the next 2 days for a check-up. They must obtain records for further management.   Do not hesitate to return to the Emergency Department for any new, worsening or concerning symptoms.    Breast Cyst A breast cyst is a sac in the breast that is filled with fluid. Breast cysts are common in women. Women can have one or many cysts. When the breasts contain many cysts, it is usually due to a noncancerous (benign) condition called fibrocystic change. These lumps form under the influence of female hormones (estrogen and progesterone). The lumps are most often located in the upper, outer portion of the breast. They are often more swollen, painful, and tender before your period starts. They usually disappear after menopause, unless you are on hormone therapy.  There are several types of cysts:  Macrocyst. This is a cyst that is about 2 in. (5.1 cm) in diameter.   Microcyst. This is a tiny cyst that you cannot feel but can be seen with a mammogram or an ultrasound.   Galactocele. This is a cyst containing milk that may develop if you suddenly stop breastfeeding.   Sebaceous cyst of the skin. This type of cyst is not in the breast tissue itself. Breast cysts do not increase your risk of breast cancer. However, they must be monitored closely because they can be cancerous.  CAUSES  It is not known exactly what causes a breast cyst to form. Possible causes include:  An overgrowth of milk glands and connective tissue in the breast can block the milk glands, causing them to fill with fluid.   Scar tissue in the breast from previous surgery may block the glands,  causing a cyst.  RISK FACTORS Estrogen may influence the development of a breast cyst.  SIGNS AND SYMPTOMS   Feeling a smooth, round, soft lump (like a grape) in the breast that is easily moveable.   Breast discomfort or pain.  Increase in size of the lump before your menstrual period and decrease in its size after your menstrual period.  DIAGNOSIS  A cyst can be felt during a physical exam by your health care provider. A breast X-ray exam (mammogram) and ultrasonography will be done to confirm the diagnosis. Fluid may be removed from the cyst with a needle (fine needle aspiration) to make sure the cyst is not cancerous.  TREATMENT  Treatment may not be necessary. Your health care provider may monitor the cyst to see if it goes away on its own. If treatment is needed, it may include:  Hormone treatment.   Needle aspiration. There is a chance of the cyst coming back after aspiration.   Surgery to remove the whole cyst.  HOME CARE INSTRUCTIONS   Keep all follow-up appointments with your health care provider.  See your health care provider regularly:  Get a yearly exam by your health care provider.  Have a clinical breast exam by a health care provider every 1-3 years if you are 34-68 years of age. After age 10 years, you should have the exam every year.   Get mammogram tests as directed by your health care provider.   Understand the normal appearance and feel of your  breasts and perform breast self-exams.   Only take over-the-counter or prescription medicines as directed by your health care provider.   Wear a supportive bra, especially when exercising.   Avoid caffeine.   Reduce your salt intake, especially before your menstrual period. Too much salt can cause fluid retention, breast swelling, and discomfort.  SEEK MEDICAL CARE IF:   You feel, or think you feel, a lump in your breast.   You notice that both breasts look or feel different than usual.    Your breast is still causing pain after your menstrual period is over.   You need medicine for breast pain and swelling that occurs with your menstrual period.  SEEK IMMEDIATE MEDICAL CARE IF:   You have severe pain, tenderness, redness, or warmth in your breast.   You have nipple discharge or bleeding.   Your breast lump becomes hard and painful.   You find new lumps or bumps that were not there before.   You feel lumps in your armpit (axilla).   You notice dimpling or wrinkling of the breast or nipple.   You have a fever.  MAKE SURE YOU:  Understand these instructions.  Will watch your condition.  Will get help right away if you are not doing well or get worse. Document Released: 05/21/2005 Document Revised: 01/21/2013 Document Reviewed: 12/18/2012 [name] Patient Information 2015 [place], [place]. This information is not intended to replace advice given to you by your health care provider. Make sure you discuss any questions you have with your health care provider.

## 2014-09-17 NOTE — ED Provider Notes (Signed)
CSN: [id number]     Arrival date & time 09/17/14  0751 History   First MD Initiated Contact with Patient 09/17/14 [phone]     Chief Complaint  Patient presents with  . Rash     (Consider location/radiation/quality/duration/timing/severity/associated sxs/prior Treatment) HPI   Kristen Moore is a 44 y.o. female complaining of painful cyst in between her breasts which she's had for several years but the area has gotten larger in the last 2 weeks. Patient states she has been "mashing on it." Foul-smelling liquid came out yesterday, when she continued to mash on it today there was a grade discharge. States pain is 8 out of 10 not alleviated by Tylenol and is exacerbated by movement and palpation. She has a family history of breast cancer (grandmother and father's sister) patient recently had a CT of the area and it was thought to be consistent with a sebaceous cyst. Patient denies fever, chills, nausea, vomiting, unintentional weight loss, easy bruising or bleeding.    Past Medical History  Diagnosis Date  . Bipolar 1 disorder   . IBS (irritable bowel syndrome)   . Thyroid dysfunction   . Abnormal Pap smear     cryo  . Fibroids   . DUB (dysfunctional uterine bleeding)   . Cyst of breast     left breast  . Hypothyroidism   . Anemia   . Asthma   . BSJGGEZM(629.4)    Past Surgical History  Procedure Laterality Date  . Cholecystectomy    . Tubal ligation    . Colon surgery    . Dilation and curettage of uterus    . Abdominal hysterectomy Bilateral 12/25/2012    Procedure: HYSTERECTOMY ABDOMINAL;  Surgeon: Emily Filbert, MD;  Location: [facility];  Service: Gynecology;  Laterality: Bilateral;  TAH and bilateral salpingectomy  . Bilateral salpingectomy Bilateral 12/25/2012    Procedure: BILATERAL SALPINGECTOMY;  Surgeon: Emily Filbert, MD;  Location: [facility];  Service: Gynecology;  Laterality: Bilateral;  . Oophorectomy Bilateral 12/25/2012    Procedure: OOPHORECTOMY;  Surgeon: Emily Filbert,  MD;  Location: [facility];  Service: Gynecology;  Laterality: Bilateral;   Family History  Problem Relation Age of Onset  . Breast cancer Maternal Grandmother   . Heart disease Father   . Cancer Father 50    lung  . Hypertension Mother   . Rheum arthritis Mother   . Fibromyalgia Mother   . COPD Mother   . Sleep apnea Mother   . Breast cancer Paternal Aunt   . Breast cancer Maternal Aunt    History  Substance Use Topics  . Smoking status: Current Every Day Smoker -- 1.00 packs/day for 9 years    Types: Cigarettes  . Smokeless tobacco: Never Used  . Alcohol Use: No   OB History    Gravida Para Term Preterm AB TAB SAB Ectopic Multiple Living   3 2 1 1 1 1    1      Review of Systems  10 systems reviewed and found to be negative, except as noted in the HPI.   Allergies  Latex; Penicillins; and Other  Home Medications   Prior to Admission medications   Medication Sig Start Date End Date Taking? Authorizing Provider  acetaminophen (TYLENOL) 325 MG tablet Take 650 mg by mouth every 6 (six) hours as needed for moderate pain or headache.   Yes Historical Provider, MD  buPROPion (ZYBAN) 150 MG 12 hr tablet Take 150 mg by mouth daily.  Yes Historical Provider, MD  dicyclomine (BENTYL) 20 MG tablet Take 20 mg by mouth 4 (four) times daily -  before meals and at bedtime. 01/19/14  Yes Lance Bosch, NP  estradiol (ESTRACE) 1 MG tablet Take 1 tablet (1 mg total) by mouth daily. 05/12/14  Yes Emily Filbert, MD  Fish Oil OIL Take 1 tablet by mouth.   Yes Historical Provider, MD  levothyroxine (SYNTHROID, LEVOTHROID) 150 MCG tablet Take 150 mcg by mouth daily before breakfast. 01/14/14  Yes Lance Bosch, NP  lithium carbonate 300 MG capsule Take 300-600 mg by mouth 2 (two) times daily with a meal. Takes 300 mg in the morning at 0800, 300 mg at 1600, and 600 mg at bedtime   Yes Historical Provider, MD  Omega-3 Fatty Acids (FISH OIL) 1000 MG CAPS Take 1,000 mg by mouth daily.   Yes Historical  Provider, MD  Brexpiprazole (REXULTI) 0.5 MG TABS Take by mouth.    Historical Provider, MD  buPROPion (WELLBUTRIN SR) 150 MG 12 hr tablet Take 150 mg by mouth daily.    Historical Provider, MD  HYDROcodone-acetaminophen (NORCO/VICODIN) 5-325 MG per tablet Take 1-2 tablets by mouth every 6 hours as needed for pain and/or cough. 09/17/14   Babe Anthis, PA-C  sulfamethoxazole-trimethoprim (BACTRIM DS,SEPTRA DS) 800-160 MG per tablet Take 1 tablet by mouth 2 (two) times daily. 09/17/14 09/24/14  Nastassia Bazaldua, PA-C   BP 133/90 mmHg  Pulse 61  Temp(Src) 97.5 F (36.4 C) (Oral)  Resp 24  Wt 190 lb (86.183 kg)  SpO2 99%  LMP 11/29/2012 Physical Exam  Constitutional: She is oriented to person, place, and time. She appears well-developed and well-nourished. No distress.  HENT:  Head: Normocephalic and atraumatic.  Mouth/Throat: Oropharynx is clear and moist.  Eyes: Conjunctivae and EOM are normal. Pupils are equal, round, and reactive to light.  Neck: Normal range of motion.  Cardiovascular: Normal rate, regular rhythm and intact distal pulses.   Pulmonary/Chest: Effort normal and breath sounds normal. No stridor.  Abdominal: Soft. There is no tenderness.  Musculoskeletal: Normal range of motion.  Lymphadenopathy:    She has no cervical adenopathy.    She has no axillary adenopathy.  Neurological: She is alert and oriented to person, place, and time.  Skin: She is not diaphoretic.  3 cm tender erythematous region in between breaths, no focal fluctuance, no surrounding cellulitis. No discharge.  Psychiatric: She has a normal mood and affect.  Nursing note and vitals reviewed.   ED Course  Procedures (including critical care time) Labs Review Labs Reviewed - No data to display  Imaging Review No results found.   EKG Interpretation None      MDM   Final diagnoses:  Sebaceous cyst of skin of left breast  FH: breast cancer    Filed Vitals:   09/17/14 0758  BP: 133/90   Pulse: 61  Temp: 97.5 F (36.4 C)  TempSrc: Oral  Resp: 24  Weight: 190 lb (86.183 kg)  SpO2: 99%    Medications  sulfamethoxazole-trimethoprim (BACTRIM DS,SEPTRA DS) 800-160 MG per tablet 1 tablet (not administered)  HYDROcodone-acetaminophen (NORCO/VICODIN) 5-325 MG per tablet 1 tablet (not administered)    [name] is a pleasant 44 y.o. female presenting with painful cyst in between breaths which she's had for over a year patient has been manipulating it over the last several days and has expressed fluid. Given patient's family history of breast cancer and CT showing sebaceous cyst, lack of signs  of systemic infection patient will be given referral to both the breast center and general surgery. We have had an extensive discussion of return precautions. Patient will be started on antibiotics more of a prophylactic measure due to the fact that she has been manipulating it, advised her to not touch the area.  Evaluation does not show pathology that would require ongoing emergent intervention or inpatient treatment. Pt is hemodynamically stable and mentating appropriately. Discussed findings and plan with patient/guardian, who agrees with care plan. All questions answered. Return precautions discussed and outpatient follow up given.   New Prescriptions   HYDROCODONE-ACETAMINOPHEN (NORCO/VICODIN) 5-325 MG PER TABLET    Take 1-2 tablets by mouth every 6 hours as needed for pain and/or cough.   SULFAMETHOXAZOLE-TRIMETHOPRIM (BACTRIM DS,SEPTRA DS) 800-160 MG PER TABLET    Take 1 tablet by mouth 2 (two) times daily.         Monico Blitz, PA-C 09/17/14 [place], DO 09/18/14 [phone]

## 2014-09-24 ENCOUNTER — Encounter (HOSPITAL_COMMUNITY): Payer: Self-pay | Admitting: Emergency Medicine

## 2014-09-24 ENCOUNTER — Encounter: Payer: Self-pay | Admitting: *Deleted

## 2014-09-24 ENCOUNTER — Ambulatory Visit: Payer: Self-pay | Admitting: Internal Medicine

## 2014-09-24 ENCOUNTER — Telehealth: Payer: Self-pay | Admitting: *Deleted

## 2014-09-24 ENCOUNTER — Other Ambulatory Visit: Payer: Self-pay | Admitting: Internal Medicine

## 2014-09-24 ENCOUNTER — Emergency Department (HOSPITAL_COMMUNITY)
Admission: EM | Admit: 2014-09-24 | Discharge: 2014-09-24 | Disposition: A | Discharge status: Home / Self Care | Payer: Self-pay | Attending: Emergency Medicine | Admitting: Emergency Medicine

## 2014-09-24 DIAGNOSIS — Z79899 Other long term (current) drug therapy: Secondary | ICD-10-CM | POA: Insufficient documentation

## 2014-09-24 DIAGNOSIS — Z91040 Latex allergy status: Secondary | ICD-10-CM | POA: Insufficient documentation

## 2014-09-24 DIAGNOSIS — Z8742 Personal history of other diseases of the female genital tract: Secondary | ICD-10-CM | POA: Insufficient documentation

## 2014-09-24 DIAGNOSIS — L0291 Cutaneous abscess, unspecified: Secondary | ICD-10-CM

## 2014-09-24 DIAGNOSIS — L02213 Cutaneous abscess of chest wall: Secondary | ICD-10-CM | POA: Insufficient documentation

## 2014-09-24 DIAGNOSIS — J45909 Unspecified asthma, uncomplicated: Secondary | ICD-10-CM | POA: Insufficient documentation

## 2014-09-24 DIAGNOSIS — Z880 Allergy status to penicillin: Secondary | ICD-10-CM | POA: Insufficient documentation

## 2014-09-24 DIAGNOSIS — Z862 Personal history of diseases of the blood and blood-forming organs and certain disorders involving the immune mechanism: Secondary | ICD-10-CM | POA: Insufficient documentation

## 2014-09-24 DIAGNOSIS — F319 Bipolar disorder, unspecified: Secondary | ICD-10-CM | POA: Insufficient documentation

## 2014-09-24 DIAGNOSIS — K589 Irritable bowel syndrome without diarrhea: Secondary | ICD-10-CM | POA: Insufficient documentation

## 2014-09-24 DIAGNOSIS — E039 Hypothyroidism, unspecified: Secondary | ICD-10-CM | POA: Insufficient documentation

## 2014-09-24 DIAGNOSIS — Z720 Tobacco use: Secondary | ICD-10-CM | POA: Insufficient documentation

## 2014-09-24 MED ORDER — SULFAMETHOXAZOLE-TRIMETHOPRIM 800-160 MG PO TABS: 1.0000 | ORAL_TABLET | Freq: Two times a day (BID) | ORAL | Status: DC

## 2014-09-24 MED ORDER — HYDROCODONE-ACETAMINOPHEN 5-325 MG PO TABS
1.0000 | ORAL_TABLET | Freq: Once | ORAL | Status: AC
  Administered 2014-09-24: 1 via ORAL
  Filled 2014-09-24: qty 1

## 2014-09-24 MED ORDER — LIDOCAINE-EPINEPHRINE (PF) 2 %-1:200000 IJ SOLN
20.0000 mL | Freq: Once | INTRAMUSCULAR | Status: AC
  Administered 2014-09-24: 6 mL
  Filled 2014-09-24: qty 20

## 2014-09-24 MED ORDER — HYDROCODONE-ACETAMINOPHEN 5-325 MG PO TABS: 1.0000 | ORAL_TABLET | ORAL | Status: DC | PRN

## 2014-09-24 MED ORDER — VITAMIN D (ERGOCALCIFEROL) 1.25 MG (50000 UNIT) PO CAPS: 50000.0000 [IU] | ORAL_CAPSULE | ORAL | Status: DC

## 2014-09-24 NOTE — Progress Notes (Signed)
Gave patient results of labwork

## 2014-09-24 NOTE — Telephone Encounter (Signed)
-----   Message from Lance Bosch, NP sent at 09/22/2014 12:11 AM EDT ----- Labs are within normal limits

## 2014-09-24 NOTE — Discharge Instructions (Signed)
Read the information below.  Use the prescribed medication as directed.  Please discuss all new medications with your pharmacist.  Do not take additional tylenol while taking the prescribed pain medication to avoid overdose.  You may return to the Emergency Department at any time for worsening condition or any new symptoms that concern you.  If there is any possibility that you might be pregnant, please let your health care provider know and discuss this with the pharmacist to ensure medication safety.   If you develop redness, swelling, pus draining from the wound, or fevers greater than 100.4, return to the ER immediately for a recheck.     Abscess An abscess is an infected area that contains a collection of pus and debris.It can occur in almost any part of the body. An abscess is also known as a furuncle or boil. CAUSES  An abscess occurs when tissue gets infected. This can occur from blockage of oil or sweat glands, infection of hair follicles, or a minor injury to the skin. As the body tries to fight the infection, pus collects in the area and creates pressure under the skin. This pressure causes pain. People with weakened immune systems have difficulty fighting infections and get certain abscesses more often.  SYMPTOMS Usually an abscess develops on the skin and becomes a painful mass that is red, warm, and tender. If the abscess forms under the skin, you may feel a moveable soft area under the skin. Some abscesses break open (rupture) on their own, but most will continue to get worse without care. The infection can spread deeper into the body and eventually into the bloodstream, causing you to feel ill.  DIAGNOSIS  Your caregiver will take your medical history and perform a physical exam. A sample of fluid may also be taken from the abscess to determine what is causing your infection. TREATMENT  Your caregiver may prescribe antibiotic medicines to fight the infection. However, taking antibiotics  alone usually does not cure an abscess. Your caregiver may need to make a small cut (incision) in the abscess to drain the pus. In some cases, gauze is packed into the abscess to reduce pain and to continue draining the area. HOME CARE INSTRUCTIONS   Only take over-the-counter or prescription medicines for pain, discomfort, or fever as directed by your caregiver.  If you were prescribed antibiotics, take them as directed. Finish them even if you start to feel better.  If gauze is used, follow your caregiver's directions for changing the gauze.  To avoid spreading the infection:  Keep your draining abscess covered with a bandage.  Wash your hands well.  Do not share personal care items, towels, or whirlpools with others.  Avoid skin contact with others.  Keep your skin and clothes clean around the abscess.  Keep all follow-up appointments as directed by your caregiver. SEEK MEDICAL CARE IF:   You have increased pain, swelling, redness, fluid drainage, or bleeding.  You have muscle aches, chills, or a general ill feeling.  You have a fever. MAKE SURE YOU:   Understand these instructions.  Will watch your condition.  Will get help right away if you are not doing well or get worse. Document Released: 02/28/2005 Document Revised: 11/20/2011 Document Reviewed: 08/03/2011 [facility] Patient Information 2015 [place], [place]. This information is not intended to replace advice given to you by your health care provider. Make sure you discuss any questions you have with your health care provider.

## 2014-09-24 NOTE — Telephone Encounter (Signed)
Left message on VM to return my call. 

## 2014-09-24 NOTE — ED Notes (Addendum)
Pt reports abscess to central/left chest for 2 weeks. Pt reports soreness radiating to left breast. Pt CP or SOB otherwise. Seen at [facility] for same a week ago.

## 2014-09-24 NOTE — ED Provider Notes (Signed)
CSN: [id number]     Arrival date & time 09/24/14  1314 History   First MD Initiated Contact with Patient 09/24/14 1324     Chief Complaint  Patient presents with  . Abscess   Patient is a 44 y.o. female presenting with abscess. The history is provided by the patient. No language interpreter was used.  Abscess Associated symptoms: no fever    This chart was scribed for non-physician practitioner [name], PA-C, working with [name], MD, by [name], ED Scribe. This patient was seen in room WTR6/WTR6 and the patient's care was started at 1:27 PM.  Kristen Moore is a 44 y.o. female who presents to the Emergency Department complaining of cyst to sternal chest. Pt was seen her 4/15 for the same complaint. She has family hx of breast cancer including her grandmother and her aunt. Pt had a CT of area which was diagnosed as a "presternal left paracentral subcutaneous nodule most likely sebaceous cyst but indeterminate ". She reports worsening sharp throbbing pain with pressure to cyst and states pain radiates to left breast for the past 2 weeks. She report cyst was initially "as small as a pea" for over a year, with increased pain and swelling only in the past 2 weeks. Pt admits to sticking needles in cyst, trying to drain it herself, and had some gray/yellow foul smelling discharge from cyst. Pt states she would like to get the cyst lanced. She reports taking medication (bactrim, norco) prescribed from last visit. She plans on being seen at the breast center but was told she needs referral from PCP - has an appointment next Wednesday. Pt denies fever, chills, CP, SOB, and cough.    Past Medical History  Diagnosis Date  . Bipolar 1 disorder   . IBS (irritable bowel syndrome)   . Thyroid dysfunction   . Abnormal Pap smear     cryo  . Fibroids   . DUB (dysfunctional uterine bleeding)   . Cyst of breast     left breast  . Hypothyroidism   . Anemia   . Asthma   . VOHYWVPX(106.2)     Past Surgical History  Procedure Laterality Date  . Cholecystectomy    . Tubal ligation    . Colon surgery    . Dilation and curettage of uterus    . Abdominal hysterectomy Bilateral 12/25/2012    Procedure: HYSTERECTOMY ABDOMINAL;  Surgeon: Marzelle Rutten Filbert, MD;  Location: [facility];  Service: Gynecology;  Laterality: Bilateral;  TAH and bilateral salpingectomy  . Bilateral salpingectomy Bilateral 12/25/2012    Procedure: BILATERAL SALPINGECTOMY;  Surgeon: Tarisa Paola Filbert, MD;  Location: [facility];  Service: Gynecology;  Laterality: Bilateral;  . Oophorectomy Bilateral 12/25/2012    Procedure: OOPHORECTOMY;  Surgeon: Tecumseh Yeagley Filbert, MD;  Location: [facility];  Service: Gynecology;  Laterality: Bilateral;   Family History  Problem Relation Age of Onset  . Breast cancer Maternal Grandmother   . Heart disease Father   . Cancer Father 59    lung  . Hypertension Mother   . Rheum arthritis Mother   . Fibromyalgia Mother   . COPD Mother   . Sleep apnea Mother   . Breast cancer Paternal Aunt   . Breast cancer Maternal Aunt    History  Substance Use Topics  . Smoking status: Current Every Day Smoker -- 1.00 packs/day for 9 years    Types: Cigarettes  . Smokeless tobacco: Never Used  . Alcohol Use: No  OB History    Gravida Para Term Preterm AB TAB SAB Ectopic Multiple Living   3 2 1 1 1 1    1      Review of Systems  Constitutional: Negative for fever and chills.  Respiratory: Negative for cough, chest tightness and shortness of breath.   Cardiovascular: Negative for chest pain.  Musculoskeletal: Negative for myalgias, neck pain and neck stiffness.  Skin: Positive for color change and wound.  Allergic/Immunologic: Negative for immunocompromised state.  Hematological: Does not bruise/bleed easily.  Psychiatric/Behavioral: Negative for self-injury.   Allergies  Latex; Penicillins; and Other  Home Medications   Prior to Admission medications   Medication Sig Start Date End Date Taking?  Authorizing Provider  acetaminophen (TYLENOL) 325 MG tablet Take 650 mg by mouth every 6 (six) hours as needed for moderate pain or headache.    Historical Provider, MD  Brexpiprazole (REXULTI) 0.5 MG TABS Take by mouth.    Historical Provider, MD  buPROPion (WELLBUTRIN SR) 150 MG 12 hr tablet Take 150 mg by mouth daily.    Historical Provider, MD  buPROPion (ZYBAN) 150 MG 12 hr tablet Take 150 mg by mouth daily.    Historical Provider, MD  dicyclomine (BENTYL) 20 MG tablet Take 20 mg by mouth 4 (four) times daily -  before meals and at bedtime. 01/19/14   Lance Bosch, NP  estradiol (ESTRACE) 1 MG tablet Take 1 tablet (1 mg total) by mouth daily. 05/12/14    Filbert, MD  Fish Oil OIL Take 1 tablet by mouth.    Historical Provider, MD  HYDROcodone-acetaminophen (NORCO/VICODIN) 5-325 MG per tablet Take 1-2 tablets by mouth every 6 hours as needed for pain and/or cough. 09/17/14   Nicole Pisciotta, PA-C  levothyroxine (SYNTHROID, LEVOTHROID) 150 MCG tablet Take 150 mcg by mouth daily before breakfast. 01/14/14   Lance Bosch, NP  lithium carbonate 300 MG capsule Take 300-600 mg by mouth 2 (two) times daily with a meal. Takes 300 mg in the morning at 0800, 300 mg at 1600, and 600 mg at bedtime    Historical Provider, MD  Omega-3 Fatty Acids (FISH OIL) 1000 MG CAPS Take 1,000 mg by mouth daily.    Historical Provider, MD  sulfamethoxazole-trimethoprim (BACTRIM DS,SEPTRA DS) 800-160 MG per tablet Take 1 tablet by mouth 2 (two) times daily. 09/17/14 09/24/14  Nicole Pisciotta, PA-C   BP 136/85 mmHg  Pulse 68  Temp(Src) 97.7 F (36.5 C) (Oral)  Resp 18  SpO2 96%  LMP 11/29/2012 Physical Exam  Constitutional: She appears well-developed and well-nourished. No distress.  HENT:  Head: Normocephalic and atraumatic.  Eyes: Conjunctivae are normal.  Neck: Normal range of motion. Neck supple.  Pulmonary/Chest: Effort normal.  Musculoskeletal: Normal range of motion. She exhibits no edema.   Neurological: She is alert.  Skin: She is not diaphoretic.  Left para sternal localized area of erythema, edema, fluctuance and tenderness. No discharge.  Psychiatric: She has a normal mood and affect. Her behavior is normal.  Nursing note and vitals reviewed.   ED Course  Procedures (including critical care time) DIAGNOSTIC STUDIES: Oxygen Saturation is 96% on RA, normal by my interpretation.    COORDINATION OF CARE: 1:47 PM- Pt advised of plan for treatment and pt agrees.  EMERGENCY DEPARTMENT US SOFT TISSUE INTERPRETATION "Study: Limited Ultrasound of the noted body part in comments below"  INDICATIONS: Pain Multiple views of the body part are obtained with a multi-frequency linear probe  PERFORMED BY:  Myself  IMAGES ARCHIVED?: Yes  SIDE:Left  BODY PART:Chest Wall  FINDINGS: Abcess present  LIMITATIONS:  Emergent Procedure  INTERPRETATION:  Abcess present  COMMENT:  Focal abscess vs infected cyst.    INCISION AND DRAINAGE Performed by: [name] Consent: Verbal consent obtained. Risks and benefits: risks, benefits and alternatives were discussed Type: abscess  Body area: chest wall  Anesthesia: local infiltration  Incision was made with a scalpel.  Local anesthetic: lidocaine 2% with epinephrine  Anesthetic total: 6 ml  Complexity: complex Blunt dissection to break up loculations  Drainage: purulent  Drainage amount: copious discharge   Packing material: none  Irrigated abscess cavity with normal saline.   Patient tolerance: Patient tolerated the procedure well with no immediate complications.     Labs Review Labs Reviewed - No data to display  Imaging Review No results found.   EKG Interpretation None      MDM   Final diagnoses:  Abscess   Afebrile, nontoxic patient with infected cyst vs abscess over chest wall.  US shows isolated drainable collection.  Large amount of purulent discharge with I&D.   D/C home with extension  of bactrim, norco.  Pt to have PCP and breast center follow up.  Discussed recent CT results with patient.   Discussed result, findings, treatment, and follow up  with patient.  Pt given return precautions.  Pt verbalizes understanding and agrees with plan.         [name], PA-C 09/24/14 [place], MD 09/24/14 [phone]

## 2014-09-24 NOTE — ED Notes (Signed)
I&D in process

## 2014-09-24 NOTE — Telephone Encounter (Signed)
Entered vitamin d Rx in error-discontinued

## 2014-09-29 ENCOUNTER — Encounter: Payer: Self-pay | Admitting: Internal Medicine

## 2014-09-29 ENCOUNTER — Ambulatory Visit: Payer: Self-pay | Attending: Internal Medicine | Admitting: Internal Medicine

## 2014-09-29 VITALS — BP 120/81 | HR 62 | Temp 97.6°F | Resp 17 | Ht 64.0 in | Wt 189.0 lb

## 2014-09-29 DIAGNOSIS — N63 Unspecified lump in unspecified breast: Secondary | ICD-10-CM

## 2014-09-29 DIAGNOSIS — F319 Bipolar disorder, unspecified: Secondary | ICD-10-CM | POA: Insufficient documentation

## 2014-09-29 DIAGNOSIS — E039 Hypothyroidism, unspecified: Secondary | ICD-10-CM | POA: Insufficient documentation

## 2014-09-29 DIAGNOSIS — F17210 Nicotine dependence, cigarettes, uncomplicated: Secondary | ICD-10-CM | POA: Insufficient documentation

## 2014-09-29 NOTE — Patient Instructions (Signed)
Please call Rolena Infante, [phone],  with the BCCCP (breast and cervical cancer control program) at the [facility] to set up an appointment to verify eligibility for a breast exam, mammogram, ultrasound. If you qualify this will be set up at [facility].

## 2014-09-29 NOTE — Progress Notes (Signed)
Pt is here today following up after her ED visit where she had a cyst lanced on her left breast.

## 2014-09-29 NOTE — Progress Notes (Signed)
Patient ID: Kristen Moore, female   DOB: 1970-10-07, 44 y.o.   MRN: [id number]  CC: breast nodule  HPI: Kristen Moore is a 44 y.o. female here today for a follow up visit.  Patient has past medical history of bipolar disorder, IBS, and thyroid disease. Patient was seen in the ER 5 days ago for abscess removal. She was initially seen here, had imaging studies that revealed the area was a cyst. The cyst become more painful so she went to ER for further evaluation. She had a I&D and was given Bactrim and Norco upon discharge. She reports that the area is still slightly painful and has small amounts of drainage. Upon review of the CT she was found to have bilateral breast nodules. She is requesting a mammogram.   Patient has No headache, No chest pain, No abdominal pain - No Nausea, No new weakness tingling or numbness, No Cough - SOB.  Allergies  Allergen Reactions  . Latex Swelling  . Penicillins Swelling  . Other Hives and Swelling    Potatoes      Past Medical History  Diagnosis Date  . Bipolar 1 disorder   . IBS (irritable bowel syndrome)   . Thyroid dysfunction   . Abnormal Pap smear     cryo  . Fibroids   . DUB (dysfunctional uterine bleeding)   . Cyst of breast     left breast  . Hypothyroidism   . Anemia   . Asthma   . YFVCBSWH(675.9)    Current Outpatient Prescriptions on File Prior to Visit  Medication Sig Dispense Refill  . acetaminophen (TYLENOL) 325 MG tablet Take 650 mg by mouth every 6 (six) hours as needed for moderate pain or headache.    Marland Kitchen buPROPion (WELLBUTRIN SR) 150 MG 12 hr tablet Take 150 mg by mouth daily.    Marland Kitchen estradiol (ESTRACE) 1 MG tablet Take 1 tablet (1 mg total) by mouth daily. 30 tablet 23  . levothyroxine (SYNTHROID, LEVOTHROID) 150 MCG tablet Take 150 mcg by mouth daily before breakfast.    . sulfamethoxazole-trimethoprim (BACTRIM DS,SEPTRA DS) 800-160 MG per tablet Take 1 tablet by mouth 2 (two) times daily. X 7 days 14 tablet 0  .  Brexpiprazole (REXULTI) 0.5 MG TABS Take by mouth.    Marland Kitchen buPROPion (ZYBAN) 150 MG 12 hr tablet Take 150 mg by mouth daily.    Marland Kitchen dicyclomine (BENTYL) 20 MG tablet Take 20 mg by mouth 4 (four) times daily -  before meals and at bedtime.    . Fish Oil OIL Take 1 tablet by mouth.    Marland Kitchen HYDROcodone-acetaminophen (NORCO/VICODIN) 5-325 MG per tablet Take 1 tablet by mouth every 4 (four) hours as needed for moderate pain or severe pain. (Patient not taking: Reported on 09/29/2014) 10 tablet 0  . lithium carbonate 300 MG capsule Take 300-600 mg by mouth 2 (two) times daily with a meal. Takes 300 mg in the morning at 0800, 300 mg at 1600, and 600 mg at bedtime    . Omega-3 Fatty Acids (FISH OIL) 1000 MG CAPS Take 1,000 mg by mouth daily.     No current facility-administered medications on file prior to visit.   Family History  Problem Relation Age of Onset  . Breast cancer Maternal Grandmother   . Heart disease Father   . Cancer Father 1    lung  . Hypertension Mother   . Rheum arthritis Mother   . Fibromyalgia Mother   . COPD Mother   .  Sleep apnea Mother   . Breast cancer Paternal Aunt   . Breast cancer Maternal Aunt    History   Social History  . Marital Status: Single    Spouse Name: N/A  . Number of Children: N/A  . Years of Education: N/A   Occupational History  . Not on file.   Social History Main Topics  . Smoking status: Current Every Day Smoker -- 1.00 packs/day for 9 years    Types: Cigarettes  . Smokeless tobacco: Never Used  . Alcohol Use: No  . Drug Use: No  . Sexual Activity: Not Currently    Birth Control/ Protection: Surgical   Other Topics Concern  . Not on file   Social History Narrative    Review of Systems: See HPI    Objective:   Filed Vitals:   09/29/14 0911  BP: 120/81  Pulse: 62  Temp: 97.6 F (36.4 C)  Resp: 17    Physical Exam  Pulmonary/Chest: Mass: unable to palpate mass bilaterall. There is no breast swelling.    Genitourinary: No  breast discharge.     Lab Results  Component Value Date   WBC 6.7 09/08/2014   HGB 15.5* 09/08/2014   HCT 45.5 09/08/2014   MCV 94.6 09/08/2014   PLT 211 09/08/2014   Lab Results  Component Value Date   CREATININE 0.72 09/08/2014   BUN 11 09/08/2014   NA 139 09/08/2014   K 5.0 09/08/2014   CL 104 09/08/2014   CO2 27 09/08/2014    No results found for: HGBA1C Lipid Panel     Component Value Date/Time   CHOL 190 01/19/2014 0911   TRIG 158* 01/19/2014 0911   HDL 39* 01/19/2014 0911   CHOLHDL 4.9 01/19/2014 0911   VLDL 32 01/19/2014 0911   LDLCALC 119* 01/19/2014 0911       Assessment and plan:   Kristen Moore was seen today for follow-up.  Diagnoses and all orders for this visit:  Breast nodule Orders: -     MM Digital Diagnostic Bilat; Future I have given patient number to [facility] coordinator to assist her with getting scholarship for mammogram.  Return if symptoms worsen or fail to improve.       Chari Manning, NP-C [facility] [phone] 09/29/2014, 9:28 AM

## 2014-10-05 ENCOUNTER — Other Ambulatory Visit (HOSPITAL_COMMUNITY): Payer: Self-pay | Admitting: *Deleted

## 2014-10-05 DIAGNOSIS — N6310 Unspecified lump in the right breast, unspecified quadrant: Secondary | ICD-10-CM

## 2014-10-05 DIAGNOSIS — N6320 Unspecified lump in the left breast, unspecified quadrant: Secondary | ICD-10-CM

## 2014-10-07 ENCOUNTER — Emergency Department (HOSPITAL_COMMUNITY)
Admission: EM | Admit: 2014-10-07 | Discharge: 2014-10-07 | Disposition: A | Discharge status: Home / Self Care | Payer: Self-pay | Attending: Emergency Medicine | Admitting: Emergency Medicine

## 2014-10-07 ENCOUNTER — Encounter (HOSPITAL_COMMUNITY): Payer: Self-pay | Admitting: Emergency Medicine

## 2014-10-07 DIAGNOSIS — K589 Irritable bowel syndrome without diarrhea: Secondary | ICD-10-CM | POA: Insufficient documentation

## 2014-10-07 DIAGNOSIS — Z91040 Latex allergy status: Secondary | ICD-10-CM | POA: Insufficient documentation

## 2014-10-07 DIAGNOSIS — L0291 Cutaneous abscess, unspecified: Secondary | ICD-10-CM

## 2014-10-07 DIAGNOSIS — Z792 Long term (current) use of antibiotics: Secondary | ICD-10-CM | POA: Insufficient documentation

## 2014-10-07 DIAGNOSIS — J45909 Unspecified asthma, uncomplicated: Secondary | ICD-10-CM | POA: Insufficient documentation

## 2014-10-07 DIAGNOSIS — Z720 Tobacco use: Secondary | ICD-10-CM | POA: Insufficient documentation

## 2014-10-07 DIAGNOSIS — E039 Hypothyroidism, unspecified: Secondary | ICD-10-CM | POA: Insufficient documentation

## 2014-10-07 DIAGNOSIS — Z79899 Other long term (current) drug therapy: Secondary | ICD-10-CM | POA: Insufficient documentation

## 2014-10-07 DIAGNOSIS — Z86018 Personal history of other benign neoplasm: Secondary | ICD-10-CM | POA: Insufficient documentation

## 2014-10-07 DIAGNOSIS — L02213 Cutaneous abscess of chest wall: Secondary | ICD-10-CM | POA: Insufficient documentation

## 2014-10-07 DIAGNOSIS — Z880 Allergy status to penicillin: Secondary | ICD-10-CM | POA: Insufficient documentation

## 2014-10-07 DIAGNOSIS — F319 Bipolar disorder, unspecified: Secondary | ICD-10-CM | POA: Insufficient documentation

## 2014-10-07 DIAGNOSIS — Z862 Personal history of diseases of the blood and blood-forming organs and certain disorders involving the immune mechanism: Secondary | ICD-10-CM | POA: Insufficient documentation

## 2014-10-07 MED ORDER — ACETAMINOPHEN 325 MG PO TABS
650.0000 mg | ORAL_TABLET | Freq: Once | ORAL | Status: AC
  Administered 2014-10-07: 650 mg via ORAL
  Filled 2014-10-07: qty 2

## 2014-10-07 MED ORDER — LIDOCAINE HCL 1 % IJ SOLN
INTRAMUSCULAR | Status: AC
  Administered 2014-10-07: 20 mL
  Filled 2014-10-07: qty 20

## 2014-10-07 MED ORDER — LIDOCAINE HCL (PF) 1 % IJ SOLN: 5.0000 mL | Freq: Once | INTRAMUSCULAR | Status: DC

## 2014-10-07 NOTE — ED Provider Notes (Signed)
CSN: [id number]     Arrival date & time 10/07/14  1119 History   First MD Initiated Contact with Patient 10/07/14 1131     Chief Complaint  Patient presents with  . Cyst   Kristen Moore is a 44 y.o. female who presents to the ED complaining of an abscess to her left chest wall for the past two days. She reports this cyst first appeared one month ago and recently had it drained here on April 22, about two weeks ago. She reports taking bactrim and finishing this antibiotic. She reports her abscess returned 2 days ago. She complains of 6/10 pain at this cyst. She denies drainage from the site. She reports it is smaller than the last time she had it drained. She reports a family history of breast cancer and is due to have a mammogram on May 19. She had a CT scan done on 09/14/14 which showed a likely sebaceous cyst as well as two small non-specific breast nodules in both of her breasts. She is scheduled for mammogram and ultrasound this month. The patient denies fevers, drainage, shortness of breath, or cough.   (Consider location/radiation/quality/duration/timing/severity/associated sxs/prior Treatment) HPI  Past Medical History  Diagnosis Date  . Bipolar 1 disorder   . IBS (irritable bowel syndrome)   . Thyroid dysfunction   . Abnormal Pap smear     cryo  . Fibroids   . DUB (dysfunctional uterine bleeding)   . Cyst of breast     left breast  . Hypothyroidism   . Anemia   . Asthma   . FOYDXAJO(878.6)    Past Surgical History  Procedure Laterality Date  . Cholecystectomy    . Tubal ligation    . Colon surgery    . Dilation and curettage of uterus    . Abdominal hysterectomy Bilateral 12/25/2012    Procedure: HYSTERECTOMY ABDOMINAL;  Surgeon: Emily Filbert, MD;  Location: [facility];  Service: Gynecology;  Laterality: Bilateral;  TAH and bilateral salpingectomy  . Bilateral salpingectomy Bilateral 12/25/2012    Procedure: BILATERAL SALPINGECTOMY;  Surgeon: Emily Filbert, MD;  Location: [facility];  Service: Gynecology;  Laterality: Bilateral;  . Oophorectomy Bilateral 12/25/2012    Procedure: OOPHORECTOMY;  Surgeon: Emily Filbert, MD;  Location: [facility];  Service: Gynecology;  Laterality: Bilateral;   Family History  Problem Relation Age of Onset  . Breast cancer Maternal Grandmother   . Heart disease Father   . Cancer Father 64    lung  . Hypertension Mother   . Rheum arthritis Mother   . Fibromyalgia Mother   . COPD Mother   . Sleep apnea Mother   . Breast cancer Paternal Aunt   . Breast cancer Maternal Aunt    History  Substance Use Topics  . Smoking status: Current Every Day Smoker -- 1.00 packs/day for 9 years    Types: Cigarettes  . Smokeless tobacco: Never Used  . Alcohol Use: No   OB History    Gravida Para Term Preterm AB TAB SAB Ectopic Multiple Living   3 2 1 1 1 1    1      Review of Systems  Constitutional: Negative for fever.  Respiratory: Negative for cough and shortness of breath.   Skin: Positive for color change. Negative for rash.       Abscess to left chest wall.       Allergies  Latex; Penicillins; and Other  Home Medications   Prior to Admission medications  Medication Sig Start Date End Date Taking? Authorizing Provider  acetaminophen (TYLENOL) 325 MG tablet Take 650 mg by mouth every 6 (six) hours as needed for moderate pain or headache.    Historical Provider, MD  Brexpiprazole (REXULTI) 0.5 MG TABS Take by mouth.    Historical Provider, MD  buPROPion (WELLBUTRIN SR) 150 MG 12 hr tablet Take 150 mg by mouth daily.    Historical Provider, MD  buPROPion (ZYBAN) 150 MG 12 hr tablet Take 150 mg by mouth daily.    Historical Provider, MD  dicyclomine (BENTYL) 20 MG tablet Take 20 mg by mouth 4 (four) times daily -  before meals and at bedtime. 01/19/14   Lance Bosch, NP  estradiol (ESTRACE) 1 MG tablet Take 1 tablet (1 mg total) by mouth daily. 05/12/14   Emily Filbert, MD  Fish Oil OIL Take 1 tablet by mouth.    Historical Provider, MD   HYDROcodone-acetaminophen (NORCO/VICODIN) 5-325 MG per tablet Take 1 tablet by mouth every 4 (four) hours as needed for moderate pain or severe pain. Patient not taking: Reported on 09/29/2014 09/24/14   [name], PA-C  levothyroxine (SYNTHROID, LEVOTHROID) 150 MCG tablet Take 150 mcg by mouth daily before breakfast. 01/14/14   Lance Bosch, NP  lithium carbonate 300 MG capsule Take 300-600 mg by mouth 2 (two) times daily with a meal. Takes 300 mg in the morning at 0800, 300 mg at 1600, and 600 mg at bedtime    Historical Provider, MD  Omega-3 Fatty Acids (FISH OIL) 1000 MG CAPS Take 1,000 mg by mouth daily.    Historical Provider, MD  sulfamethoxazole-trimethoprim (BACTRIM DS,SEPTRA DS) 800-160 MG per tablet Take 1 tablet by mouth 2 (two) times daily. X 7 days 09/24/14   [name], PA-C   BP 139/77 mmHg  Pulse 58  Temp(Src) 98.5 F (36.9 C) (Oral)  Resp 18  SpO2 100%  LMP 11/29/2012 Physical Exam  Constitutional: She appears well-developed and well-nourished. No distress.  HENT:  Head: Normocephalic and atraumatic.  Eyes: Right eye exhibits no discharge. Left eye exhibits no discharge.  Neck: Normal range of motion. Neck supple. No JVD present.  Cardiovascular: Normal rate, regular rhythm, normal heart sounds and intact distal pulses.   Pulmonary/Chest: Effort normal and breath sounds normal. No respiratory distress. She has no wheezes. She has no rales.  Cyst to left chest wall about 3 cm in diameter. Overlying erythema without surrounding erythema. No drainage.   Abdominal: Soft. There is no tenderness.  Lymphadenopathy:    She has no cervical adenopathy.  Neurological: She is alert. Coordination normal.  Skin: Skin is warm and dry. No rash noted. She is not diaphoretic.  Psychiatric: She has a normal mood and affect. Her behavior is normal.  Nursing note and vitals reviewed.   ED Course  INCISION AND DRAINAGE Date/Time: 10/07/2014 12:30 PM Performed by: Waynetta Pean Authorized by: Waynetta Pean Consent: Verbal consent obtained. Risks and benefits: risks, benefits and alternatives were discussed Consent given by: patient Patient understanding: patient states understanding of the procedure being performed Patient consent: the patient's understanding of the procedure matches consent given Procedure consent: procedure consent matches procedure scheduled Relevant documents: relevant documents present and verified Test results: test results available and properly labeled Site marked: the operative site was marked Imaging studies: imaging studies available Required items: required blood products, implants, devices, and special equipment available Patient identity confirmed: verbally with patient Time out: Immediately prior to procedure a "time out"  was called to verify the correct patient, procedure, equipment, support staff and site/side marked as required. Type: abscess Body area: trunk Location details: chest Anesthesia: local infiltration Local anesthetic: lidocaine 1% without epinephrine Anesthetic total: 1 ml Patient sedated: no Scalpel size: 11 Incision type: single straight Complexity: simple Drainage: purulent Drainage amount: moderate Wound treatment: wound left open Packing material: none Patient tolerance: Patient tolerated the procedure well with no immediate complications   (including critical care time) Labs Review Labs Reviewed - No data to display  Imaging Review No results found.   EKG Interpretation None      Filed Vitals:   10/07/14 1130 10/07/14 1315  BP: 139/77   Pulse: 68 58  Temp: 98.5 F (36.9 C)   TempSrc: Oral   Resp: 18   SpO2: 97% 100%     MDM   Meds given in ED:  Medications  lidocaine (PF) (XYLOCAINE) 1 % injection 5 mL (5 mLs Infiltration Not Given 10/07/14 1220)  lidocaine (XYLOCAINE) 1 % (with pres) injection (20 mLs  Given 10/07/14 1219)  acetaminophen (TYLENOL) tablet 650 mg (650 mg  Oral Given 10/07/14 1314)    Discharge Medication List as of 10/07/2014  1:11 PM      Final diagnoses:  Abscess   This is a 44 y.o. female who presents to the ED complaining of an abscess to her left chest wall for the past two days. She reports this cyst first appeared one month ago and recently had it drained here on April 22, about two weeks ago. She reports taking bactrim and finishing this antibiotic. She reports her abscess returned 2 days ago. She complains of 6/10 pain at this cyst. She denies drainage from the site. She reports she is scheduled for mammogram on May 19. On exam patient is afebrile and nontoxic appearing. She has a 3 cm in diameter abscess/cyst to her left chest wall above her left breast. This is the same cyst that was drained 2 weeks ago. Incision and drainage performed by me and tolerated well by the patient. A moderate amount of purulent drainage was obtained from the site. Wound left open. Will discharge with follow up with PCP. Patient reports she takes Tylenol as needed for pain and does not wish for prescription. Will hold off on antibiotics as there is no cellulitis and the patient is afebrile. Discussed infection warning sings and wound care with patient.  I advised the patient to follow-up with their primary care provider this week. I advised the patient to return to the emergency department with new or worsening symptoms or new concerns. The patient verbalized understanding and agreement with plan.      Waynetta Pean, PA-C 10/07/14 [place], DO 10/07/14 [phone]

## 2014-10-07 NOTE — Discharge Instructions (Signed)
Abscess °An abscess is an infected area that contains a collection of pus and debris. It can occur in almost any part of the body. An abscess is also known as a furuncle or boil. °CAUSES  °An abscess occurs when tissue gets infected. This can occur from blockage of oil or sweat glands, infection of hair follicles, or a minor injury to the skin. As the body tries to fight the infection, pus collects in the area and creates pressure under the skin. This pressure causes pain. People with weakened immune systems have difficulty fighting infections and get certain abscesses more often.  °SYMPTOMS °Usually an abscess develops on the skin and becomes a painful mass that is red, warm, and tender. If the abscess forms under the skin, you may feel a moveable soft area under the skin. Some abscesses break open (rupture) on their own, but most will continue to get worse without care. The infection can spread deeper into the body and eventually into the bloodstream, causing you to feel ill.  °DIAGNOSIS  °Your caregiver will take your medical history and perform a physical exam. A sample of fluid may also be taken from the abscess to determine what is causing your infection. °TREATMENT  °Your caregiver may prescribe antibiotic medicines to fight the infection. However, taking antibiotics alone usually does not cure an abscess. Your caregiver may need to make a small cut (incision) in the abscess to drain the pus. In some cases, gauze is packed into the abscess to reduce pain and to continue draining the area. °HOME CARE INSTRUCTIONS  °· Only take over-the-counter or prescription medicines for pain, discomfort, or fever as directed by your caregiver. °· If you were prescribed antibiotics, take them as directed. Finish them even if you start to feel better. °· If gauze is used, follow your caregiver's directions for changing the gauze. °· To avoid spreading the infection: °· Keep your draining abscess covered with a  bandage. °· Wash your hands well. °· Do not share personal care items, towels, or whirlpools with others. °· Avoid skin contact with others. °· Keep your skin and clothes clean around the abscess. °· Keep all follow-up appointments as directed by your caregiver. °SEEK MEDICAL CARE IF:  °· You have increased pain, swelling, redness, fluid drainage, or bleeding. °· You have muscle aches, chills, or a general ill feeling. °· You have a fever. °MAKE SURE YOU:  °· Understand these instructions. °· Will watch your condition. °· Will get help right away if you are not doing well or get worse. °Document Released: 02/28/2005 Document Revised: 11/20/2011 Document Reviewed: 08/03/2011 °Exit[employer]® Patient Information ©2015 [employer]. This information is not intended to replace advice given to you by your health care provider. Make sure you discuss any questions you have with your health care provider. ° °Abscess °Care After °An abscess (also called a boil or furuncle) is an infected area that contains a collection of pus. Signs and symptoms of an abscess include pain, tenderness, redness, or hardness, or you may feel a moveable soft area under your skin. An abscess can occur anywhere in the body. The infection may spread to surrounding tissues causing cellulitis. A cut (incision) by the surgeon was made over your abscess and the pus was drained out. Gauze may have been packed into the space to provide a drain that will allow the cavity to heal from the inside outwards. The boil may be painful for 5 to 7 days. Most people with a boil do not have   high fevers. Your abscess, if seen early, may not have localized, and may not have been lanced. If not, another appointment may be required for this if it does not get better on its own or with medications. °HOME CARE INSTRUCTIONS  °· Only take over-the-counter or prescription medicines for pain, discomfort, or fever as directed by your caregiver. °· When you bathe, soak and then  remove gauze or iodoform packs at least daily or as directed by your caregiver. You may then wash the wound gently with mild soapy water. Repack with gauze or do as your caregiver directs. °SEEK IMMEDIATE MEDICAL CARE IF:  °· You develop increased pain, swelling, redness, drainage, or bleeding in the wound site. °· You develop signs of generalized infection including muscle aches, chills, fever, or a general ill feeling. °· An oral temperature above 102° F (38.9° C) develops, not controlled by medication. °See your caregiver for a recheck if you develop any of the symptoms described above. If medications (antibiotics) were prescribed, take them as directed. °Document Released: 12/07/2004 Document Revised: 08/13/2011 Document Reviewed: 08/04/2007 °ExitCare® Patient Information ©2015 [employer]. This information is not intended to replace advice given to you by your health care provider. Make sure you discuss any questions you have with your health care provider. ° °

## 2014-10-07 NOTE — ED Notes (Signed)
Per pt, states she had cyst/abscess drained on the 22nd of April-states she finished round of antibiotics-states the cyst is back

## 2014-10-11 ENCOUNTER — Ambulatory Visit: Payer: Self-pay | Attending: Internal Medicine

## 2014-10-21 ENCOUNTER — Ambulatory Visit (HOSPITAL_COMMUNITY)
Admission: RE | Admit: 2014-10-21 | Discharge: 2014-10-21 | Disposition: A | Discharge status: Home / Self Care | Payer: Self-pay | Source: Ambulatory Visit | Attending: Obstetrics and Gynecology | Admitting: Obstetrics and Gynecology

## 2014-10-21 ENCOUNTER — Encounter (HOSPITAL_COMMUNITY): Payer: Self-pay

## 2014-10-21 VITALS — BP 120/82 | Temp 98.1°F | Ht 64.0 in | Wt 189.8 lb

## 2014-10-21 DIAGNOSIS — Z1239 Encounter for other screening for malignant neoplasm of breast: Secondary | ICD-10-CM

## 2014-10-21 DIAGNOSIS — N6324 Unspecified lump in the left breast, lower inner quadrant: Secondary | ICD-10-CM

## 2014-10-21 NOTE — Progress Notes (Signed)
Complaints of bilateral breast lumps. Patient stated the left breast is painful. Patient states the pain comes and goes rating it at a 8 out of 10. Patient complained of a left raised area on skin that hurts. Patient rated pain at a 7 out of 10. Patient states it has drained bloody yellow colored fluid.  Pap Smear: Pap smear not completed today. Last Pap smear was 06/06/2011 at [facility] and normal. Per patient has a history of an abnormal Pap smear in 1992 that required a LEEP for follow-up. Per patient all Pap smears have been normal since LEEP. Patient has a history of a hysterectomy 12/25/2012 due to fibroids. Patient no longer needs Pap smears due to her history of a hysterectomy for benign reasons per BCCCP and ACOG guidelines. Last Pap smear result is in EPIC.  Physical exam: Breasts Breasts symmetrical. Raised healing lesion center of breasts located above breasts. No nipple retraction bilateral breasts. No nipple discharge bilateral breasts. No lymphadenopathy. No lumps palpated right breast. Palpated a lump within the left breast at 7 o'clock 6 cm from the nipple.Complaints of left breast tenderness on exam. Referred patient to the [facility] for diagnostic mammogram. Appointment scheduled for Friday, Oct 22, 2014 at 1020.       Pelvic/Bimanual No Pap smear completed today since patient has a history of a hysterectomy for benign reasons. Pap smear not indicated per BCCCP guidelines.

## 2014-10-21 NOTE — Patient Instructions (Signed)
Educational materials on self breast awareness given. Explained to [employer] that she did not need a Pap smear today due to her history of a hysterectomy for benign reasons. Let patient know that she does not need any further Pap smears due to her history of a hysterectomy for benign reasons. Referred patient to the [facility] for diagnostic mammogram. Appointment scheduled for Friday, Oct 22, 2014 at 1020. Patient aware of appointment and will be there. Smoking cessation discussed with patient and resources given. Kristen Moore verbalized understanding.  Cydne Grahn, Arvil Chaco, RN 4:46 PM

## 2014-10-22 ENCOUNTER — Ambulatory Visit
Admission: RE | Admit: 2014-10-22 | Discharge: 2014-10-22 | Disposition: A | Discharge status: Home / Self Care | Payer: No Typology Code available for payment source | Source: Ambulatory Visit | Attending: Obstetrics and Gynecology | Admitting: Obstetrics and Gynecology

## 2014-10-22 DIAGNOSIS — N6310 Unspecified lump in the right breast, unspecified quadrant: Secondary | ICD-10-CM

## 2014-10-22 DIAGNOSIS — N6320 Unspecified lump in the left breast, unspecified quadrant: Secondary | ICD-10-CM

## 2014-11-15 ENCOUNTER — Encounter: Payer: Self-pay | Admitting: Internal Medicine

## 2014-11-15 ENCOUNTER — Ambulatory Visit: Payer: Self-pay | Attending: Internal Medicine | Admitting: Internal Medicine

## 2014-11-15 VITALS — BP 111/71 | HR 60 | Temp 98.5°F | Resp 16 | Ht 64.0 in | Wt 184.0 lb

## 2014-11-15 DIAGNOSIS — M25561 Pain in right knee: Secondary | ICD-10-CM

## 2014-11-15 DIAGNOSIS — L723 Sebaceous cyst: Secondary | ICD-10-CM

## 2014-11-15 MED ORDER — TRAMADOL HCL 50 MG PO TABS: 50.0000 mg | ORAL_TABLET | Freq: Three times a day (TID) | ORAL | Status: DC | PRN

## 2014-11-15 MED ORDER — SULFAMETHOXAZOLE-TRIMETHOPRIM 800-160 MG PO TABS: 1.0000 | ORAL_TABLET | Freq: Two times a day (BID) | ORAL | Status: DC

## 2014-11-15 NOTE — Progress Notes (Signed)
Rt knee pain, no Hx injury  Burning and pain worsen with sitting for a long period of time and going up the stairs

## 2014-11-15 NOTE — Patient Instructions (Signed)
Knee Pain The knee is the complex joint between your thigh and your lower leg. It is made up of bones, tendons, ligaments, and cartilage. The bones that make up the knee are:  The femur in the thigh.  The tibia and fibula in the lower leg.  The patella or kneecap riding in the groove on the lower femur. CAUSES  Knee pain is a common complaint with many causes. A few of these causes are:  Injury, such as:  A ruptured ligament or tendon injury.  Torn cartilage.  Medical conditions, such as:  Gout  Arthritis  Infections  Overuse, over training, or overdoing a physical activity. Knee pain can be minor or severe. Knee pain can accompany debilitating injury. Minor knee problems often respond well to self-care measures or get well on their own. More serious injuries may need medical intervention or even surgery. SYMPTOMS The knee is complex. Symptoms of knee problems can vary widely. Some of the problems are:  Pain with movement and weight bearing.  Swelling and tenderness.  Buckling of the knee.  Inability to straighten or extend your knee.  Your knee locks and you cannot straighten it.  Warmth and redness with pain and fever.  Deformity or dislocation of the kneecap. DIAGNOSIS  Determining what is wrong may be very straight forward such as when there is an injury. It can also be challenging because of the complexity of the knee. Tests to make a diagnosis may include:  Your caregiver taking a history and doing a physical exam.  Routine X-rays can be used to rule out other problems. X-rays will not reveal a cartilage tear. Some injuries of the knee can be diagnosed by:  Arthroscopy a surgical technique by which a small video camera is inserted through tiny incisions on the sides of the knee. This procedure is used to examine and repair internal knee joint problems. Tiny instruments can be used during arthroscopy to repair the torn knee cartilage (meniscus).  Arthrography  is a radiology technique. A contrast liquid is directly injected into the knee joint. Internal structures of the knee joint then become visible on X-ray film.  An MRI scan is a non X-ray radiology procedure in which magnetic fields and a computer produce two- or three-dimensional images of the inside of the knee. Cartilage tears are often visible using an MRI scanner. MRI scans have largely replaced arthrography in diagnosing cartilage tears of the knee.  Blood work.  Examination of the fluid that helps to lubricate the knee joint (synovial fluid). This is done by taking a sample out using a needle and a syringe. TREATMENT The treatment of knee problems depends on the cause. Some of these treatments are:  Depending on the injury, proper casting, splinting, surgery, or physical therapy care will be needed.  Give yourself adequate recovery time. Do not overuse your joints. If you begin to get sore during workout routines, back off. Slow down or do fewer repetitions.  For repetitive activities such as cycling or running, maintain your strength and nutrition.  Alternate muscle groups. For example, if you are a weight lifter, work the upper body on one day and the lower body the next.  Either tight or weak muscles do not give the proper support for your knee. Tight or weak muscles do not absorb the stress placed on the knee joint. Keep the muscles surrounding the knee strong.  Take care of mechanical problems.  If you have flat feet, orthotics or special shoes may help.  See your caregiver if you need help.  Arch supports, sometimes with wedges on the inner or outer aspect of the heel, can help. These can shift pressure away from the side of the knee most bothered by osteoarthritis.  A brace called an "unloader" brace also may be used to help ease the pressure on the most arthritic side of the knee.  If your caregiver has prescribed crutches, braces, wraps or ice, use as directed. The acronym  for this is PRICE. This means protection, rest, ice, compression, and elevation.  Nonsteroidal anti-inflammatory drugs (NSAIDs), can help relieve pain. But if taken immediately after an injury, they may actually increase swelling. Take NSAIDs with food in your stomach. Stop them if you develop stomach problems. Do not take these if you have a history of ulcers, stomach pain, or bleeding from the bowel. Do not take without your caregiver's approval if you have problems with fluid retention, heart failure, or kidney problems.  For ongoing knee problems, physical therapy may be helpful.  Glucosamine and chondroitin are over-the-counter dietary supplements. Both may help relieve the pain of osteoarthritis in the knee. These medicines are different from the usual anti-inflammatory drugs. Glucosamine may decrease the rate of cartilage destruction.  Injections of a corticosteroid drug into your knee joint may help reduce the symptoms of an arthritis flare-up. They may provide pain relief that lasts a few months. You may have to wait a few months between injections. The injections do have a small increased risk of infection, water retention, and elevated blood sugar levels.  Hyaluronic acid injected into damaged joints may ease pain and provide lubrication. These injections may work by reducing inflammation. A series of shots may give relief for as long as 6 months.  Topical painkillers. Applying certain ointments to your skin may help relieve the pain and stiffness of osteoarthritis. Ask your pharmacist for suggestions. Many over the-counter products are approved for temporary relief of arthritis pain.  In some countries, doctors often prescribe topical NSAIDs for relief of chronic conditions such as arthritis and tendinitis. A review of treatment with NSAID creams found that they worked as well as oral medications but without the serious side effects. PREVENTION  Maintain a healthy weight. Extra pounds  put more strain on your joints.  Get strong, stay limber. Weak muscles are a common cause of knee injuries. Stretching is important. Include flexibility exercises in your workouts.  Be smart about exercise. If you have osteoarthritis, chronic knee pain or recurring injuries, you may need to change the way you exercise. This does not mean you have to stop being active. If your knees ache after jogging or playing basketball, consider switching to swimming, water aerobics, or other low-impact activities, at least for a few days a week. Sometimes limiting high-impact activities will provide relief.  Make sure your shoes fit well. Choose footwear that is right for your sport.  Protect your knees. Use the proper gear for knee-sensitive activities. Use kneepads when playing volleyball or laying carpet. Buckle your seat belt every time you drive. Most shattered kneecaps occur in car accidents.  Rest when you are tired. SEEK MEDICAL CARE IF:  You have knee pain that is continual and does not seem to be getting better.  SEEK IMMEDIATE MEDICAL CARE IF:  Your knee joint feels hot to the touch and you have a high fever. MAKE SURE YOU:   Understand these instructions.  Will watch your condition.  Will get help right away if you are not  doing well or get worse. Document Released: 03/18/2007 Document Revised: 08/13/2011 Document Reviewed: 03/18/2007 [facility] Patient Information 2015 [place], [place]. This information is not intended to replace advice given to you by your health care provider. Make sure you discuss any questions you have with your health care provider. Epidermal Cyst An epidermal cyst is sometimes called a sebaceous cyst, epidermal inclusion cyst, or infundibular cyst. These cysts usually contain a substance that looks "pasty" or "cheesy" and may have a bad smell. This substance is a protein called keratin. Epidermal cysts are usually found on the face, neck, or trunk. They may also occur in  the vaginal area or other parts of the genitalia of both men and women. Epidermal cysts are usually small, painless, slow-growing bumps or lumps that move freely under the skin. It is important not to try to pop them. This may cause an infection and lead to tenderness and swelling. CAUSES  Epidermal cysts may be caused by a deep penetrating injury to the skin or a plugged hair follicle, often associated with acne. SYMPTOMS  Epidermal cysts can become inflamed and cause:  Redness.  Tenderness.  Increased temperature of the skin over the bumps or lumps.  Grayish-white, bad smelling material that drains from the bump or lump. DIAGNOSIS  Epidermal cysts are easily diagnosed by your caregiver during an exam. Rarely, a tissue sample (biopsy) may be taken to rule out other conditions that may resemble epidermal cysts. TREATMENT   Epidermal cysts often get better and disappear on their own. They are rarely ever cancerous.  If a cyst becomes infected, it may become inflamed and tender. This may require opening and draining the cyst. Treatment with antibiotics may be necessary. When the infection is gone, the cyst may be removed with minor surgery.  Small, inflamed cysts can often be treated with antibiotics or by injecting steroid medicines.  Sometimes, epidermal cysts become large and bothersome. If this happens, surgical removal in your caregiver's office may be necessary. HOME CARE INSTRUCTIONS  Only take over-the-counter or prescription medicines as directed by your caregiver.  Take your antibiotics as directed. Finish them even if you start to feel better. SEEK MEDICAL CARE IF:   Your cyst becomes tender, red, or swollen.  Your condition is not improving or is getting worse.  You have any other questions or concerns. MAKE SURE YOU:  Understand these instructions.  Will watch your condition.  Will get help right away if you are not doing well or get worse. Document Released:  04/21/2004 Document Revised: 08/13/2011 Document Reviewed: 11/27/2010 [facility] Patient Information 2015 [place], [place]. This information is not intended to replace advice given to you by your health care provider. Make sure you discuss any questions you have with your health care provider.

## 2014-11-15 NOTE — Progress Notes (Signed)
   Subjective:    Patient ID: Kristen Moore, female    DOB: 09/08/1970, 44 y.o.   MRN: [id number]  HPI Comments: Pain worse with sitting, better with standing. Anytime we bends her knee it hurts worse. Had knee pain years ago and had a steroid injections which helped her pain significantly. Has been diagnosed with osteoarthritis by orthopedics in the past.   Knee Pain  There was no injury mechanism. The pain is present in the right knee. The quality of the pain is described as burning (grinding, "feel like it is coming out od place"). The pain is moderate. Pertinent negatives include no inability to bear weight, loss of motion or loss of sensation. The symptoms are aggravated by movement. She has tried NSAIDs, heat and acetaminophen (has tried tramadol with little relief) for the symptoms. The treatment provided mild relief.      Review of Systems  Unable to perform ROS Musculoskeletal: Positive for arthralgias. Negative for myalgias, joint swelling and neck stiffness.       Objective:   Physical Exam  Constitutional: She is oriented to person, place, and time.  Musculoskeletal: She exhibits tenderness (right knee). She exhibits no edema.  Pain with ROM, crepitus    Neurological: She is alert and oriented to person, place, and time.  Skin:             Assessment & Plan:  Kristen Moore was seen today for knee pain.  Diagnoses and all orders for this visit:  Sebaceous cyst Orders: -     sulfamethoxazole-trimethoprim (BACTRIM DS,SEPTRA DS) 800-160 MG per tablet; Take 1 tablet by mouth 2 (two) times daily. She will call back when she is ready for a referral to [facility] for surgical removal   Right knee pain Orders: -     traMADol (ULTRAM) 50 MG tablet; Take 1 tablet (50 mg total) by mouth every 8 (eight) hours as needed. Patient will call back once she gets hospital discount to get Ortho referral for knee pain. Would like injections  May follow as needed or if symptoms do  not improve   Chari Manning, NP 11/15/2014 3:25 PM

## 2014-11-17 ENCOUNTER — Telehealth: Payer: Self-pay | Admitting: Internal Medicine

## 2014-11-17 NOTE — Telephone Encounter (Signed)
1. Pt calling to confirm she has the orange card and is ready for referral to knee specialist.   2. Pt forgot to mention she is out of dicyclomine (BENTYL) 20 MG tablet, please send refill to  [facility] on [address].

## 2014-11-22 NOTE — Telephone Encounter (Signed)
Patient called to let PCP know that she received her orange card and 100% coverage, patient would now like to be referred to a specialist for her knee. Please f/u with pt.

## 2014-11-23 ENCOUNTER — Other Ambulatory Visit: Payer: Self-pay

## 2014-11-23 ENCOUNTER — Other Ambulatory Visit: Payer: Self-pay | Admitting: Internal Medicine

## 2014-11-23 DIAGNOSIS — M25561 Pain in right knee: Secondary | ICD-10-CM

## 2014-11-23 MED ORDER — DICYCLOMINE HCL 20 MG PO TABS: 20.0000 mg | ORAL_TABLET | Freq: Three times a day (TID) | ORAL | Status: DC

## 2014-11-23 NOTE — Telephone Encounter (Signed)
Please inform patient referral has been placed

## 2014-11-23 NOTE — Telephone Encounter (Signed)
No answer or voicemail to leave a message.

## 2014-11-23 NOTE — Telephone Encounter (Signed)
Left message to call patient back. 

## 2014-11-23 NOTE — Telephone Encounter (Signed)
Bentyl has been called in and referral has been place.

## 2014-12-13 ENCOUNTER — Ambulatory Visit (INDEPENDENT_AMBULATORY_CARE_PROVIDER_SITE_OTHER): Payer: No Typology Code available for payment source | Admitting: Family Medicine

## 2014-12-13 ENCOUNTER — Encounter: Payer: Self-pay | Admitting: Family Medicine

## 2014-12-13 VITALS — BP 112/85 | HR 63 | Ht 64.0 in | Wt 184.0 lb

## 2014-12-13 DIAGNOSIS — M25561 Pain in right knee: Secondary | ICD-10-CM

## 2014-12-13 HISTORY — DX: Pain in right knee: M25.561

## 2014-12-13 NOTE — Progress Notes (Signed)
Patient ID: Kristen Moore, female   DOB: [birth year], 44 y.o.   MRN: [id number]  CC: R. Knee Pain  HPI: Kristen Moore is a 44 y.o. female presenting with complaints of R. Knee pain for about 3 months. States that back in 2007 she was seen at [facility] who diagnosed here with bone-on-bone OA and was given a cortisone injection which helped her pain. She was told to follow-up as needed. She never went back and now pain is more severe. Pain worse at night and wakes her from sleep. Also severe with bending and going down steps. Localizes pain to right anterior knee. She takes tramadol for pain.    Physical Exam: BP 112/85 mmHg  Pulse 63  Ht 5\' 4"  (1.626 m)  Wt 184 lb (83.462 kg)  BMI 31.57 kg/m2  LMP 11/29/2012  General: alert, well-developed, NAD, talkative Msk: no joint swelling, no joint warmth, and no redness over joints. Range of motion normal. Tenderness to palpation of later compartments and patella.   Negative McMurray and Lachman Pulses: intact Extremities: No cyanosis, clubbing, edema Neurologic: No focal deficits appreciated,+5 strength globally, sensation grossly intact, gait normal, A&Ox3. Skin: Intact without suspicious lesions or rashes. Warm and dry. Psych: Hypomanic.    Assessment/Plan:  A: R. Knee pain with unknown etiology. Possible patellofemoral syndrome.  P:  -Will obtain MRI to help with further diagnosis etiology - F/u 1 wk after imaging - Reviewed knee films - Can consider injection at follow-up - Continue pain medications prn    Luiz Blare, DO 12/13/2014, 2:49 PM PGY-2, [place]

## 2014-12-23 ENCOUNTER — Ambulatory Visit (HOSPITAL_COMMUNITY)
Admission: RE | Admit: 2014-12-23 | Discharge: 2014-12-23 | Disposition: A | Discharge status: Home / Self Care | Payer: No Typology Code available for payment source | Source: Ambulatory Visit | Attending: Family Medicine | Admitting: Family Medicine

## 2014-12-23 DIAGNOSIS — M25561 Pain in right knee: Secondary | ICD-10-CM | POA: Insufficient documentation

## 2014-12-31 ENCOUNTER — Encounter: Payer: Self-pay | Admitting: Family Medicine

## 2014-12-31 ENCOUNTER — Ambulatory Visit (INDEPENDENT_AMBULATORY_CARE_PROVIDER_SITE_OTHER): Payer: Self-pay | Admitting: Family Medicine

## 2014-12-31 VITALS — BP 125/90 | Ht 64.0 in | Wt 180.0 lb

## 2014-12-31 DIAGNOSIS — M25561 Pain in right knee: Secondary | ICD-10-CM

## 2014-12-31 MED ORDER — METHYLPREDNISOLONE ACETATE 40 MG/ML IJ SUSP
40.0000 mg | Freq: Once | INTRAMUSCULAR | Status: AC
  Administered 2014-12-31: 40 mg via INTRA_ARTICULAR

## 2014-12-31 NOTE — Progress Notes (Signed)
Patient ID: LIZZY HAMRE, female   DOB: 1970/11/11, 44 y.o.   MRN: [id number]  CC: R. Knee Pain  HPI: NIKAELA COYNE is a 44 y.o. female presenting with complaints of R. Knee pain for about 3 months. States that back in 2007 she was seen at [place] who diagnosed here with bone-on-bone OA and was given a cortisone injection which helped her pain. She was told to follow-up as needed. She never went back and now pain is more severe. Pain worse at night and wakes her from sleep. Also severe with bending and going down steps. Localizes pain to right anterior knee. She takes tramadol for pain.   Pt seen on 7/11 and recommended MRI to evaluate for underlying pathology.  Her MRI was completed and showed no meniscal, ligamentous, cartridge, or arthritic changes.  She has been having no change since last visit and is interested in cortisone injection today.     Physical Exam: BP 125/90 mmHg  Ht 5\' 4"  (1.626 m)  Wt 180 lb (81.647 kg)  BMI 30.88 kg/m2  LMP 11/29/2012  General: alert, well-developed, NAD, talkative Msk: no joint swelling, no joint warmth, and no redness over joints. Range of motion normal. Tenderness to palpation of later compartments and patella.   Negative McMurray and Lachman Pulses: intact Extremities: No cyanosis, clubbing, edema Neurologic: No focal deficits

## 2014-12-31 NOTE — Assessment & Plan Note (Signed)
MRI from middle July 2016 not showing any acute pathology or chronic etiology of her knee pain. 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  -Recommended a cortisone injection help calm down any inflammation she may have in there. Patient amenable to this. -If she has no improvement with this would consider formal physical therapy versus quad exercises to help strengthen the VMO.  - continue  Anti-inflammatory medications when necessary   Aspiration/Injection Procedure Note MELONI HINZ 1970/12/17  Procedure: Injection Indications: R knee Pain   Procedure Details Consent: Risks of procedure as well as the alternatives and risks of each were explained to the (patient/caregiver).  Consent for procedure obtained. Time Out: Verified patient identification, verified procedure, site/side was marked, verified correct patient position, special equipment/implants available, medications/allergies/relevent history reviewed, required imaging and test results available.  Performed.  The area was cleaned with iodine and alcohol swabs.    The R knee was injected using 1 cc's of 40 mg Depomedrol and 1 cc's of 1% lidocaine with a 21 1 1/2" needle.    A sterile dressing was applied.  Patient did tolerate procedure well. Estimated blood loss: None

## 2015-01-02 NOTE — Progress Notes (Signed)
Patient ID: Kristen Moore, female   DOB: Mar 30, 1971, 44 y.o.   MRN: [id number] [facility]: Attending Note: I have reviewed the chart, discussed wit the Sports Medicine Fellow. I agree with assessment and treatment plan as detailed in the [place] note.

## 2015-01-10 ENCOUNTER — Telehealth: Payer: Self-pay | Admitting: Internal Medicine

## 2015-01-10 NOTE — Telephone Encounter (Signed)
Pt is requesting a refill on levothyroxine (SYNTHROID, LEVOTHROID) 150 MCG tablet Pt uses [facility] off [address]. Please follow up with pt. Thank you.

## 2015-01-17 ENCOUNTER — Telehealth: Payer: Self-pay | Admitting: *Deleted

## 2015-01-17 DIAGNOSIS — M25561 Pain in right knee: Secondary | ICD-10-CM

## 2015-01-17 NOTE — Telephone Encounter (Signed)
Pt was not at home but mom says knee was still bothering her. Gave mom the number to PT and told her to tell the patient once she gets office work to call PT to set up her 1st PT appt.

## 2015-01-19 NOTE — Telephone Encounter (Signed)
Pt is requesting a refill on levothyroxine (SYNTHROID, LEVOTHROID) 150 MCG tablet Pt uses [facility] [address]. Please follow up with pt. Thank you

## 2015-01-20 ENCOUNTER — Other Ambulatory Visit: Payer: Self-pay | Admitting: Internal Medicine

## 2015-01-25 ENCOUNTER — Telehealth: Payer: Self-pay

## 2015-01-25 MED ORDER — LEVOTHYROXINE SODIUM 150 MCG PO TABS: 150.0000 ug | ORAL_TABLET | Freq: Every day | ORAL | Status: DC

## 2015-01-25 NOTE — Telephone Encounter (Signed)
Patient called to request a med refill for her Thyroid medication, patient uses [facility] on [address]. Please f/u with pt.

## 2015-01-25 NOTE — Telephone Encounter (Signed)
Returned patient phone call Patient requesting a refill on her thyroid medication Medication sent to [facility] on [address]

## 2015-01-27 ENCOUNTER — Ambulatory Visit: Payer: No Typology Code available for payment source | Admitting: Physical Therapy

## 2015-02-14 ENCOUNTER — Ambulatory Visit: Payer: No Typology Code available for payment source | Attending: Family Medicine | Admitting: Physical Therapy

## 2015-02-14 DIAGNOSIS — M25561 Pain in right knee: Secondary | ICD-10-CM | POA: Insufficient documentation

## 2015-02-14 DIAGNOSIS — M25461 Effusion, right knee: Secondary | ICD-10-CM | POA: Insufficient documentation

## 2015-02-14 DIAGNOSIS — R269 Unspecified abnormalities of gait and mobility: Secondary | ICD-10-CM | POA: Insufficient documentation

## 2015-02-14 NOTE — Patient Instructions (Signed)
Cryotherapy Cryotherapy means treatment with cold. Ice or gel packs can be used to reduce both pain and swelling. Ice is the most helpful within the first 24 to 48 hours after an injury or flare-up from overusing a muscle or joint. Sprains, strains, spasms, burning pain, shooting pain, and aches can all be eased with ice. Ice can also be used when recovering from surgery. Ice is effective, has very few side effects, and is safe for most people to use. PRECAUTIONS  Ice is not a safe treatment option for people with:  Raynaud phenomenon. This is a condition affecting small blood vessels in the extremities. Exposure to cold may cause your problems to return.  Cold hypersensitivity. There are many forms of cold hypersensitivity, including:  Cold urticaria. Red, itchy hives appear on the skin when the tissues begin to warm after being iced.  Cold erythema. This is a red, itchy rash caused by exposure to cold.  Cold hemoglobinuria. Red blood cells break down when the tissues begin to warm after being iced. The hemoglobin that carry oxygen are passed into the urine because they cannot combine with blood proteins fast enough.  Numbness or altered sensitivity in the area being iced. If you have any of the following conditions, do not use ice until you have discussed cryotherapy with your caregiver:  Heart conditions, such as arrhythmia, angina, or chronic heart disease.  High blood pressure.  Healing wounds or open skin in the area being iced.  Current infections.  Rheumatoid arthritis.  Poor circulation.  Diabetes. Ice slows the blood flow in the region it is applied. This is beneficial when trying to stop inflamed tissues from spreading irritating chemicals to surrounding tissues. However, if you expose your skin to cold temperatures for too long or without the proper protection, you can damage your skin or nerves. Watch for signs of skin damage due to cold. HOME CARE INSTRUCTIONS Follow  these tips to use ice and cold packs safely.  Place a dry or damp towel between the ice and skin. A damp towel will cool the skin more quickly, so you may need to shorten the time that the ice is used.  For a more rapid response, add gentle compression to the ice.  Ice for no more than 10 to 20 minutes at a time. The bonier the area you are icing, the less time it will take to get the benefits of ice.  Check your skin after 5 minutes to make sure there are no signs of a poor response to cold or skin damage.  Rest 20 minutes or more between uses.  Once your skin is numb, you can end your treatment. You can test numbness by very lightly touching your skin. The touch should be so light that you do not see the skin dimple from the pressure of your fingertip. When using ice, most people will feel these normal sensations in this order: cold, burning, aching, and numbness.  Do not use ice on someone who cannot communicate their responses to pain, such as small children or people with dementia. HOW TO MAKE AN ICE PACK Ice packs are the most common way to use ice therapy. Other methods include ice massage, ice baths, and cryosprays. Muscle creams that cause a cold, tingly feeling do not offer the same benefits that ice offers and should not be used as a substitute unless recommended by your caregiver. To make an ice pack, do one of the following:  Place crushed ice or a  bag of frozen vegetables in a sealable plastic bag. Squeeze out the excess air. Place this bag inside another plastic bag. Slide the bag into a pillowcase or place a damp towel between your skin and the bag.  Mix 3 parts water with 1 part rubbing alcohol. Freeze the mixture in a sealable plastic bag. When you remove the mixture from the freezer, it will be slushy. Squeeze out the excess air. Place this bag inside another plastic bag. Slide the bag into a pillowcase or place a damp towel between your skin and the bag. SEEK MEDICAL CARE  IF:  You develop white spots on your skin. This may give the skin a blotchy (mottled) appearance.  Your skin turns blue or pale.  Your skin becomes waxy or hard.  Your swelling gets worse. MAKE SURE YOU:   Understand these instructions.  Will watch your condition.  Will get help right away if you are not doing well or get worse. Document Released: 01/15/2011 Document Revised: 10/05/2013 Document Reviewed: 01/15/2011 [facility] Patient Information 2015 [place], [place]. This information is not intended to replace advice given to you by your health care provider. Make sure you discuss any questions you have with your health care provider.  Starr Lake PT, DPT, LAT, ATC  [facility] Phone: [phone]

## 2015-02-14 NOTE — Therapy (Signed)
[place], [place], [address] Phone: [phone]   Fax:  [phone]  Physical Therapy Evaluation  Patient Details  Name: Kristen Moore MRN: [id number] Date of Birth: [birth year] Referring Provider:  Dickie La, MD  Encounter Date: 02/14/2015      PT End of Session - 02/14/15 1741    Visit Number 1   Number of Visits 12   Date for PT Re-Evaluation 03/28/15   PT Start Time 1500   PT Stop Time 1550   PT Time Calculation (min) 50 min   Activity Tolerance Patient limited by pain   Behavior During Therapy [facility] for tasks assessed/performed      Past Medical History  Diagnosis Date  . Bipolar 1 disorder   . IBS (irritable bowel syndrome)   . Thyroid dysfunction   . Abnormal Pap smear     cryo  . Fibroids   . DUB (dysfunctional uterine bleeding)   . Cyst of breast     left breast  . Hypothyroidism   . Anemia   . Asthma   . EXHBZJIR(678.9)     Past Surgical History  Procedure Laterality Date  . Cholecystectomy    . Tubal ligation    . Colon surgery    . Dilation and curettage of uterus    . Abdominal hysterectomy Bilateral 12/25/2012    Procedure: HYSTERECTOMY ABDOMINAL;  Surgeon: Emily Filbert, MD;  Location: [facility];  Service: Gynecology;  Laterality: Bilateral;  TAH and bilateral salpingectomy  . Bilateral salpingectomy Bilateral 12/25/2012    Procedure: BILATERAL SALPINGECTOMY;  Surgeon: Emily Filbert, MD;  Location: [facility];  Service: Gynecology;  Laterality: Bilateral;  . Oophorectomy Bilateral 12/25/2012    Procedure: OOPHORECTOMY;  Surgeon: Emily Filbert, MD;  Location: [facility];  Service: Gynecology;  Laterality: Bilateral;  . Cervical biopsy  w/ loop electrode excision      There were no vitals filed for this visit.  Visit Diagnosis:  Right knee pain - Plan: PT plan of care cert/re-cert  Swelling of right knee joint - Plan: PT plan of care cert/re-cert  Abnormality of gait - Plan: PT  plan of care cert/re-cert      Subjective Assessment - 02/14/15 1516    Subjective pt s is a 44 y.o F with CC of R knee pain tha thas been going on for 4 months. she reports it started insidiously.  She got a cortizone injection in the knee but reports it didn't help. since onset she reports the pain stays the same.    Limitations Sitting;Lifting;Standing;Walking;House hold activities   How long can you sit comfortably? 1 hour   How long can you stand comfortably? 20 -30 minutes   How long can you walk comfortably? 20-30 min   Diagnostic tests 12/23/2014 Normal MRI the right knee   Patient Stated Goals to be pain free   Currently in Pain? Yes   Pain Score 5    Pain Location Knee   Pain Orientation Right   Pain Descriptors / Indicators Dull;Sore;Throbbing   Pain Type Chronic pain   Pain Onset More than a month ago   Pain Frequency Intermittent   Aggravating Factors  prolonged standing/walking, going down steps,  going up taking big steps   Pain Relieving Factors tylenol, ice, sitting and resting   Effect of Pain on Daily Activities limited endurance,             [facility] PT Assessment - 02/14/15  1522    Assessment   Medical Diagnosis R knee pain   Onset Date/Surgical Date --  couple of months   Hand Dominance Right   Next MD Visit make on PRN   Prior Therapy no   Precautions   Precautions None   Restrictions   Weight Bearing Restrictions No   Balance Screen   Has the patient fallen in the past 6 months Yes   How many times? 1   Has the patient had a decrease in activity level because of a fear of falling?  Yes   Is the patient reluctant to leave their home because of a fear of falling?  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[employer], Restaurant manager, fast food Requirements prolonged standing, walking carrying   Leisure movies, playing WIFI, goodwill   Cognition   Overall Cognitive Status Within Functional Limits for tasks assessed   Posture/Postural Control   Posture/Postural Control Postural limitations   Postural Limitations Rounded Shoulders;Forward head   ROM / Strength   AROM / PROM / Strength AROM;PROM;Strength   AROM   AROM Assessment Site Knee   Right/Left Knee Right;Left   Right Knee Extension 0   Right Knee Flexion 130   Left Knee Extension 0   Left Knee Flexion 138   Strength   Strength Assessment Site Hip;Knee   Right/Left Hip Right;Left   Right Hip Flexion 4+/5   Right Hip Extension 4+/5   Right Hip ABduction 4/5   Right Hip ADduction 4+/5   Left Hip Flexion 4/5   Left Hip Extension 4+/5   Left Hip ABduction 4/5   Left Hip ADduction 4+/5   Right/Left Knee Right;Left   Right Knee Flexion 5/5   Right Knee Extension 5/5   Left Knee Flexion 5/5   Left Knee Extension 5/5   Palpation   Patella mobility hypermobility in the R patella compared bil   Palpation comment tendnerness noted around the medial aspect of the joint with palpable nodule in the the Medial patellar retinaculum.    Special Tests    Special Tests Knee Special Tests;Meniscus Tests   Knee Special tests  Lateral Pull Sign;Patellofemoral Apprehension Test;Step-up/Step Down Test   Meniscus Tests McMurray Test   Lateral Pull Sign    Findings Negative   Patellofemoral Apprehension Test    Findings Negative   Step-up/Step Down    Findings Positive   Side  Right   McMurray Test   Findings Positive   Side Right   Ambulation/Gait   Gait Pattern Step-through pattern;Antalgic                   [facility] Adult PT Treatment/Exercise - 02/14/15 1522     Modalities   Modalities Cryotherapy   Cryotherapy   Number Minutes Cryotherapy 10 Minutes   Cryotherapy Location Knee  R in supine   Type of Cryotherapy Ice pack                PT Education - 02/14/15 1741    Education provided  Yes   Education Details evaluation findings, POC, goals,HEP    Person(s) Educated Patient   Methods Explanation   Comprehension Verbalized understanding          PT Short Term Goals - 02/14/15 1747    PT SHORT TERM GOAL #1   Title pt will be I with inital HEP (03/07/2015)   Time 3   Period Weeks   Status New   PT SHORT TERM GOAL #2   Title pt will be able to verbalize and demonstrate techniques to reduce pain and inflammation via RICE and HEP (03/07/2015)   Time 3   Period Weeks   Status New           PT Long Term Goals - 02/14/15 1748    PT LONG TERM GOAL #1   Title At discharge pt will be I with all HEP given (03/28/2015)   Time 6   Period Weeks   Status New   PT LONG TERM GOAL #2   Title pt will be able to navigate up/down >12  steps with < 4/10 pain to assist with community ambulation and ADLs (03/28/2015)   Time 6   Period Weeks   Status New   PT LONG TERM GOAL #3   Title pt will increase her FOTO score to > 59 to assist with improved function at discharge (03/28/2015)   Time 6   Period Weeks   Status New   PT LONG TERM GOAL #4   Title pt will be able to walk > 2 blocks to help with job related activities with <4/10 pain (03/28/2015)   Time [place] - 02/14/15 1742    Clinical Impression Statement Mrs. Timko presents to OPPT with CC of R knee pain that has been present for a couple months. She recieved a cortisone injection a few weeks ago, which she reports didn't help. She demonstrates functional knee AROM of the R knee with mild limitation in flexion compared bil. MMT was [facility] with mild pain noted during testing. Palpation revealed tenderness at the medial joint line  of the knee with a palpable nodule in the medial retinaculum. She would benefit from skilled physical therapy to decreased her pain and return her to her PLOF by addressing the impairments listed.    Pt will benefit from skilled therapeutic intervention in order to improve on the following deficits Abnormal gait;Pain;Decreased balance;Decreased endurance;Decreased activity tolerance;Decreased mobility;Decreased range of motion;Improper body mechanics;Postural dysfunction;Increased edema   Rehab Potential Good   PT Frequency 2x / week   PT Duration 6 weeks   PT Treatment/Interventions ADLs/Self Care Home Management;Cryotherapy;Electrical Stimulation;Iontophoresis 4mg /ml Dexamethasone;Moist Heat;Ultrasound;Therapeutic activities;Therapeutic exercise;Balance training;Neuromuscular re-education;Patient/family education;Manual techniques;Passive range of motion;Dry needling;Taping;Vasopneumatic Device   PT Next Visit Plan assess response to HEP, manual for quads/knee, hip and knee strengthening, McConnell taping? modalities PRN   PT Home Exercise Plan cryotherapy, SLR, and SAQ   Consulted and Agree with Plan of Care Patient         Problem List Patient Active Problem List   Diagnosis Date Noted  . Knee pain, right 12/13/2014  . Menorrhagia 12/25/2012  . Anemia 12/25/2012  . Pelvic pain 12/25/2012  . CHEST PAIN UNSPECIFIED 11/05/2008  . ELECTROCARDIOGRAM, ABNORMAL 11/05/2008   Starr Lake PT, DPT, LAT, ATC  02/14/2015  5:57 PM      [employer]  [place], [place], [address] Phone: [phone]   Fax:  [phone]

## 2015-02-28 ENCOUNTER — Ambulatory Visit: Payer: No Typology Code available for payment source | Admitting: Physical Therapy

## 2015-02-28 DIAGNOSIS — M25461 Effusion, right knee: Secondary | ICD-10-CM

## 2015-02-28 DIAGNOSIS — R269 Unspecified abnormalities of gait and mobility: Secondary | ICD-10-CM

## 2015-02-28 DIAGNOSIS — M25561 Pain in right knee: Secondary | ICD-10-CM

## 2015-02-28 NOTE — Therapy (Signed)
[place], [place], [address] Phone: [phone]   Fax:  [phone]  Physical Therapy Treatment  Patient Details  Name: Kristen Moore MRN: [id number] Date of Birth: 27-Nov-1970 Referring Provider:  Dickie La, MD  Encounter Date: 02/28/2015      PT End of Session - 02/28/15 1808    Visit Number 2   Number of Visits 12   PT Start Time 1503   PT Stop Time 1603   PT Time Calculation (min) 60 min   Activity Tolerance Patient tolerated treatment well;Patient limited by pain   Behavior During Therapy [facility] for tasks assessed/performed      Past Medical History  Diagnosis Date  . Bipolar 1 disorder   . IBS (irritable bowel syndrome)   . Thyroid dysfunction   . Abnormal Pap smear     cryo  . Fibroids   . DUB (dysfunctional uterine bleeding)   . Cyst of breast     left breast  . Hypothyroidism   . Anemia   . Asthma   . JJKKXFGH(829.9)     Past Surgical History  Procedure Laterality Date  . Cholecystectomy    . Tubal ligation    . Colon surgery    . Dilation and curettage of uterus    . Abdominal hysterectomy Bilateral 12/25/2012    Procedure: HYSTERECTOMY ABDOMINAL;  Surgeon: Emily Filbert, MD;  Location: [facility];  Service: Gynecology;  Laterality: Bilateral;  TAH and bilateral salpingectomy  . Bilateral salpingectomy Bilateral 12/25/2012    Procedure: BILATERAL SALPINGECTOMY;  Surgeon: Emily Filbert, MD;  Location: [facility];  Service: Gynecology;  Laterality: Bilateral;  . Oophorectomy Bilateral 12/25/2012    Procedure: OOPHORECTOMY;  Surgeon: Emily Filbert, MD;  Location: [facility];  Service: Gynecology;  Laterality: Bilateral;  . Cervical biopsy  w/ loop electrode excision      There were no vitals filed for this visit.  Visit Diagnosis:  Right knee pain  Swelling of right knee joint  Abnormality of gait      Subjective Assessment - 02/28/15 1507    Subjective Not using ice at home.  stiff  and painful..  6/10 a typical day.     Currently in Pain? Yes   Pain Score 6    Pain Location Knee   Pain Orientation Right   Pain Descriptors / Indicators Aching;Sore   Pain Frequency Intermittent   Aggravating Factors  bending down things at work,  sitting too long,     Pain Relieving Factors standing briefly when sitting.                           [facility] Adult PT Treatment/Exercise - 02/28/15 1510    Self-Care   Self-Care --  reviewed use of cold, how to do, leg elevation etc why impor   Knee/Hip Exercises: Supine   Quad Sets 10 reps  with small roll under knee for comfort   Short Arc Quad Sets Limitations 10 x 2 SETS   Heel Slides AAROM   Terminal Knee Extension 10 reps  prone, 5 seconds,cues   Straight Leg Raises 10 reps   Straight Leg Raises Limitations WITH er AND CUES FOR qUAD SET WITH LIFT   Knee/Hip Exercises: Prone   Straight Leg Raises Limitations 3 STOPPED DUE TO BACK PAIN   Other Prone Exercises TERMINAL KNEE EXTENSION 10 x    Cryotherapy   Number Minutes Cryotherapy  15 Minutes   Cryotherapy Location Knee   Type of Cryotherapy --  vasopneumatic 32 degrees light presure.sidelying                PT Education - 02/28/15 1517    Education provided Yes   Education Details retrograde, cold    Person(s) Educated Patient   Methods Explanation   Comprehension Verbalized understanding          PT Short Term Goals - 02/28/15 1810    PT SHORT TERM GOAL #1   Title pt will be I with inital HEP (03/07/2015)   Baseline cues needed   Time 3   Period Weeks   Status On-going   PT SHORT TERM GOAL #2   Title pt will be able to verbalize and demonstrate techniques to reduce pain and inflammation via RICE and HEP (03/07/2015)   Baseline needs cues   Time 3   Period Weeks   Status On-going           PT Long Term Goals - 02/14/15 1748    PT LONG TERM GOAL #1   Title At discharge pt will be I with all HEP given (03/28/2015)   Time 6    Period Weeks   Status New   PT LONG TERM GOAL #2   Title pt will be able to navigate up/down >12  steps with < 4/10 pain to assist with community ambulation and ADLs (03/28/2015)   Time 6   Period Weeks   Status New   PT LONG TERM GOAL #3   Title pt will increase her FOTO score to > 59 to assist with improved function at discharge (03/28/2015)   Time 6   Period Weeks   Status New   PT LONG TERM GOAL #4   Title pt will be able to walk > 2 blocks to help with job related activities with <4/10 pain (03/28/2015)   Time 6   Period Weeks   Status New               Plan - 02/28/15 1809    Clinical Impression Statement Painful today, needed to go over use of cold, not using at home yet.  Exercises limited to beginner's level due to pain.  No new goals met.   PT Next Visit Plan Tape, strengthening as able, likes vasopneumatic   PT Home Exercise Plan cryotherapy, SLR, and SAQ   Consulted and Agree with Plan of Care Patient        Problem List Patient Active Problem List   Diagnosis Date Noted  . Knee pain, right 12/13/2014  . Menorrhagia 12/25/2012  . Anemia 12/25/2012  . Pelvic pain 12/25/2012  . CHEST PAIN UNSPECIFIED 11/05/2008  . ELECTROCARDIOGRAM, ABNORMAL 11/05/2008    Neddie Steedman 02/28/2015, 6:12 PM  [facility] [address] [place], [place], [address] Phone: [phone]   Fax:  [phone]     Melvenia Needles, PTA 02/28/2015 6:12 PM Phone: [phone] Fax: [phone]

## 2015-02-28 NOTE — Patient Instructions (Signed)
Cryotherapy Cryotherapy is when you put ice on your injury. Ice helps lessen pain and puffiness (swelling) after an injury. Ice works the best when you start using it in the first 24 to 48 hours after an injury. HOME CARE  Put a dry or damp towel between the ice pack and your skin.  You may press gently on the ice pack.  Leave the ice on for no more than 10 to 20 minutes at a time.  Check your skin after 5 minutes to make sure your skin is okay.  Rest at least 20 minutes between ice pack uses.  Stop using ice when your skin loses feeling (numbness).  Do not use ice on someone who cannot tell you when it hurts. This includes small children and people with memory problems (dementia). GET HELP RIGHT AWAY IF:  You have white spots on your skin.  Your skin turns blue or pale.  Your skin feels waxy or hard.  Your puffiness gets worse. MAKE SURE YOU:   Understand these instructions.  Will watch your condition.  Will get help right away if you are not doing well or get worse. Document Released: 11/07/2007 Document Revised: 08/13/2011 Document Reviewed: 01/11/2011 ExitCare Patient Information 2015 [employer]. This information is not intended to replace advice given to you by your health care provider. Make sure you discuss any questions you have with your health care provider.  

## 2015-03-02 ENCOUNTER — Ambulatory Visit: Payer: No Typology Code available for payment source | Admitting: Physical Therapy

## 2015-03-07 ENCOUNTER — Ambulatory Visit: Payer: No Typology Code available for payment source | Attending: Family Medicine | Admitting: Physical Therapy

## 2015-03-07 DIAGNOSIS — M25461 Effusion, right knee: Secondary | ICD-10-CM | POA: Insufficient documentation

## 2015-03-07 DIAGNOSIS — R269 Unspecified abnormalities of gait and mobility: Secondary | ICD-10-CM | POA: Insufficient documentation

## 2015-03-07 DIAGNOSIS — M25561 Pain in right knee: Secondary | ICD-10-CM

## 2015-03-07 NOTE — Therapy (Signed)
[place], [place], [address] Phone: [phone]   Fax:  [phone]  Physical Therapy Treatment  Patient Details  Name: Kristen Moore MRN: [id number] Date of Birth: Apr 05, 1971 Referring Provider:  Lance Bosch, NP  Encounter Date: 03/07/2015      PT End of Session - 03/07/15 1628    Visit Number 3   Number of Visits 12   Date for PT Re-Evaluation 03/28/15   PT Start Time 5732   PT Stop Time 1630   PT Time Calculation (min) 42 min   Activity Tolerance Patient tolerated treatment well;No increased pain   Behavior During Therapy [facility] for tasks assessed/performed      Past Medical History  Diagnosis Date  . Bipolar 1 disorder   . IBS (irritable bowel syndrome)   . Thyroid dysfunction   . Abnormal Pap smear     cryo  . Fibroids   . DUB (dysfunctional uterine bleeding)   . Cyst of breast     left breast  . Hypothyroidism   . Anemia   . Asthma   . KGURKYHC(623.7)     Past Surgical History  Procedure Laterality Date  . Cholecystectomy    . Tubal ligation    . Colon surgery    . Dilation and curettage of uterus    . Abdominal hysterectomy Bilateral 12/25/2012    Procedure: HYSTERECTOMY ABDOMINAL;  Surgeon: Emily Filbert, MD;  Location: [facility];  Service: Gynecology;  Laterality: Bilateral;  TAH and bilateral salpingectomy  . Bilateral salpingectomy Bilateral 12/25/2012    Procedure: BILATERAL SALPINGECTOMY;  Surgeon: Emily Filbert, MD;  Location: [facility];  Service: Gynecology;  Laterality: Bilateral;  . Oophorectomy Bilateral 12/25/2012    Procedure: OOPHORECTOMY;  Surgeon: Emily Filbert, MD;  Location: [facility];  Service: Gynecology;  Laterality: Bilateral;  . Cervical biopsy  w/ loop electrode excision      There were no vitals filed for this visit.  Visit Diagnosis:  Right knee pain  Abnormality of gait      Subjective Assessment - 03/07/15 1555    Subjective lots of moving around 7/10  now.  Purchased a block for using cold.   Currently in Pain? Yes   Pain Score 7    Pain Location Knee   Pain Orientation Right   Pain Descriptors / Indicators Sore   Pain Frequency Intermittent   Aggravating Factors  a lot of walking   Pain Relieving Factors cold                          [facility] Adult PT Treatment/Exercise - 03/07/15 1607    Knee/Hip Exercises: Standing   Forward Step Up Right;1 set;10 reps;Hand Hold: 2;Step Height: 4"   Other Standing Knee Exercises sit to stand   Knee/Hip Exercises: Supine   Quad Sets --  10X   Short Arc Quad Sets AROM;Strengthening;Left;Both;2 sets;10 reps   Heel Slides 10 reps   Straight Leg Raises 10 reps  cues   Manual Therapy   Manual therapy comments taping to prevent lateral tracking.                    PT Short Term Goals - 03/07/15 1630    PT SHORT TERM GOAL #1   Title pt will be I with inital HEP (03/07/2015)   Time 3   Period Weeks   Status Achieved   PT SHORT TERM  GOAL #2   Title pt will be able to verbalize and demonstrate techniques to reduce pain and inflammation via RICE and HEP (03/07/2015)   Baseline uses at home   Time 3   Period Weeks   Status Achieved           PT Long Term Goals - 02/14/15 1748    PT LONG TERM GOAL #1   Title At discharge pt will be I with all HEP given (03/28/2015)   Time 6   Period Weeks   Status New   PT LONG TERM GOAL #2   Title pt will be able to navigate up/down >12  steps with < 4/10 pain to assist with community ambulation and ADLs (03/28/2015)   Time 6   Period Weeks   Status New   PT LONG TERM GOAL #3   Title pt will increase her FOTO score to > 59 to assist with improved function at discharge (03/28/2015)   Time 6   Period Weeks   Status New   PT LONG TERM GOAL #4   Title pt will be able to walk > 2 blocks to help with job related activities with <4/10 pain (03/28/2015)   Time 6   Period Weeks   Status New               Plan -  03/07/15 1629    Clinical Impression Statement Less painful able to tolerate more exercises today, including some closed chain.     PT Next Visit Plan Tape, closed chain     PT Home Exercise Plan continue   Consulted and Agree with Plan of Care Patient        Problem List Patient Active Problem List   Diagnosis Date Noted  . Knee pain, right 12/13/2014  . Menorrhagia 12/25/2012  . Anemia 12/25/2012  . Pelvic pain 12/25/2012  . CHEST PAIN UNSPECIFIED 11/05/2008  . ELECTROCARDIOGRAM, ABNORMAL 11/05/2008    Kristen Moore 03/07/2015, 4:31 PM  [facility] [address] [place], [place], [address] Phone: [phone]   Fax:  [phone]     Melvenia Needles, PTA 03/07/2015 4:31 PM Phone: [phone] Fax: [phone]

## 2015-03-09 ENCOUNTER — Ambulatory Visit: Payer: No Typology Code available for payment source | Admitting: Physical Therapy

## 2015-03-09 DIAGNOSIS — M25561 Pain in right knee: Secondary | ICD-10-CM

## 2015-03-09 DIAGNOSIS — M25461 Effusion, right knee: Secondary | ICD-10-CM

## 2015-03-09 DIAGNOSIS — R269 Unspecified abnormalities of gait and mobility: Secondary | ICD-10-CM

## 2015-03-09 NOTE — Therapy (Signed)
[place], [place], [address] Phone: [phone]   Fax:  [phone]  Physical Therapy Treatment  Patient Details  Name: Kristen Moore MRN: [id number] Date of Birth: [birth year] Referring Provider:  Lance Bosch, NP  Encounter Date: 03/09/2015      PT End of Session - 03/09/15 1628    Visit Number 4   Number of Visits 12   Date for PT Re-Evaluation 03/28/15   PT Start Time 1600  pt arrived 15 minutes late.    PT Stop Time 1648   PT Time Calculation (min) 48 min   Activity Tolerance Patient tolerated treatment well   Behavior During Therapy WFL for tasks assessed/performed      Past Medical History  Diagnosis Date  . Bipolar 1 disorder   . IBS (irritable bowel syndrome)   . Thyroid dysfunction   . Abnormal Pap smear     cryo  . Fibroids   . DUB (dysfunctional uterine bleeding)   . Cyst of breast     left breast  . Hypothyroidism   . Anemia   . Asthma   . LTJQZESP(233.0)     Past Surgical History  Procedure Laterality Date  . Cholecystectomy    . Tubal ligation    . Colon surgery    . Dilation and curettage of uterus    . Abdominal hysterectomy Bilateral 12/25/2012    Procedure: HYSTERECTOMY ABDOMINAL;  Surgeon: Emily Filbert, MD;  Location: [facility];  Service: Gynecology;  Laterality: Bilateral;  TAH and bilateral salpingectomy  . Bilateral salpingectomy Bilateral 12/25/2012    Procedure: BILATERAL SALPINGECTOMY;  Surgeon: Emily Filbert, MD;  Location: [facility];  Service: Gynecology;  Laterality: Bilateral;  . Oophorectomy Bilateral 12/25/2012    Procedure: OOPHORECTOMY;  Surgeon: Emily Filbert, MD;  Location: [facility];  Service: Gynecology;  Laterality: Bilateral;  . Cervical biopsy  w/ loop electrode excision      There were no vitals filed for this visit.  Visit Diagnosis:  Right knee pain  Abnormality of gait  Swelling of right knee joint      Subjective Assessment - 03/09/15  1611    Subjective "I feel like the exercises are helping, with the knee pain"   Currently in Pain? Yes   Pain Score 5    Pain Location Knee   Pain Orientation Right   Pain Descriptors / Indicators Aching   Pain Type Chronic pain   Pain Onset More than a month ago   Pain Frequency Intermittent   Aggravating Factors  lots of walking   Pain Relieving Factors cold                         [facility] Adult PT Treatment/Exercise - 03/09/15 1612    Knee/Hip Exercises: Aerobic   Stationary Bike L 1 x 5 min   Knee/Hip Exercises: Supine   Short Arc Quad Sets AROM;Strengthening;Left;Both;2 sets;10 reps  4#   Straight Leg Raises 10 reps;1 set   Modalities   Modalities Vasopneumatic   Vasopneumatic   Number Minutes Vasopneumatic  10 minutes   Vasopnuematic Location  Knee  Right   Vasopneumatic Pressure Medium   Vasopneumatic Temperature  32   Manual Therapy   Manual Therapy Taping   McConnell Lateral to Medial taping  relief following taping                PT Education - 03/09/15 1628  Education provided Yes   Education Details HEP review   Person(s) Educated Patient   Methods Explanation   Comprehension Verbalized understanding          PT Short Term Goals - 03/07/15 1630    PT SHORT TERM GOAL #1   Title pt will be I with inital HEP (03/07/2015)   Time 3   Period Weeks   Status Achieved   PT SHORT TERM GOAL #2   Title pt will be able to verbalize and demonstrate techniques to reduce pain and inflammation via RICE and HEP (03/07/2015)   Baseline uses at home   Time 3   Period Weeks   Status Achieved           PT Long Term Goals - 02/14/15 1748    PT LONG TERM GOAL #1   Title At discharge pt will be I with all HEP given (03/28/2015)   Time 6   Period Weeks   Status New   PT LONG TERM GOAL #2   Title pt will be able to navigate up/down >12  steps with < 4/10 pain to assist with community ambulation and ADLs (03/28/2015)   Time 6   Period  Weeks   Status New   PT LONG TERM GOAL #3   Title pt will increase her FOTO score to > 59 to assist with improved function at discharge (03/28/2015)   Time 6   Period Weeks   Status New   PT LONG TERM GOAL #4   Title pt will be able to walk > 2 blocks to help with job related activities with <4/10 pain (03/28/2015)   Time 6   Period Weeks   Status New               Plan - 03/09/15 1711    Clinical Impression Statement Kristen Moore reports to therapy 15 minutes late. She reports that she is doing better with her exercises and that she feels like she is coming along. She reported decreased pain with mcConnell taping and she was able to do her exercises without complaints. She reported decreased pain follow vasopnuematic compression.    PT Next Visit Plan Tape, closed chain strengthening, modalities PRN,    PT Home Exercise Plan continue with current HEP        Problem List Patient Active Problem List   Diagnosis Date Noted  . Knee pain, right 12/13/2014  . Menorrhagia 12/25/2012  . Anemia 12/25/2012  . Pelvic pain 12/25/2012  . CHEST PAIN UNSPECIFIED 11/05/2008  . ELECTROCARDIOGRAM, ABNORMAL 11/05/2008   Starr Lake PT, DPT, LAT, ATC  03/09/2015  5:19 PM     [facility] [address] [place], [place], [address] Phone: [phone]   Fax:  [phone]

## 2015-03-14 ENCOUNTER — Ambulatory Visit: Payer: No Typology Code available for payment source | Admitting: Physical Therapy

## 2015-03-14 DIAGNOSIS — R269 Unspecified abnormalities of gait and mobility: Secondary | ICD-10-CM

## 2015-03-14 DIAGNOSIS — M25561 Pain in right knee: Secondary | ICD-10-CM

## 2015-03-14 DIAGNOSIS — M25461 Effusion, right knee: Secondary | ICD-10-CM

## 2015-03-14 NOTE — Therapy (Signed)
[place], [place], [address] Phone: [phone]   Fax:  [phone]  Physical Therapy Treatment  Patient Details  Name: Kristen Moore MRN: [id number] Date of Birth: 1970/10/23 Referring Provider:  Dickie La, MD  Encounter Date: 03/14/2015      PT End of Session - 03/14/15 1627    Visit Number 5   Number of Visits 12   Date for PT Re-Evaluation 03/28/15   PT Start Time 4259   PT Stop Time 1629   PT Time Calculation (min) 39 min   Activity Tolerance Patient tolerated treatment well;No increased pain   Behavior During Therapy [facility] for tasks assessed/performed      Past Medical History  Diagnosis Date  . Bipolar 1 disorder   . IBS (irritable bowel syndrome)   . Thyroid dysfunction   . Abnormal Pap smear     cryo  . Fibroids   . DUB (dysfunctional uterine bleeding)   . Cyst of breast     left breast  . Hypothyroidism   . Anemia   . Asthma   . DGLOVFIE(332.9)     Past Surgical History  Procedure Laterality Date  . Cholecystectomy    . Tubal ligation    . Colon surgery    . Dilation and curettage of uterus    . Abdominal hysterectomy Bilateral 12/25/2012    Procedure: HYSTERECTOMY ABDOMINAL;  Surgeon: Emily Filbert, MD;  Location: [facility];  Service: Gynecology;  Laterality: Bilateral;  TAH and bilateral salpingectomy  . Bilateral salpingectomy Bilateral 12/25/2012    Procedure: BILATERAL SALPINGECTOMY;  Surgeon: Emily Filbert, MD;  Location: [facility];  Service: Gynecology;  Laterality: Bilateral;  . Oophorectomy Bilateral 12/25/2012    Procedure: OOPHORECTOMY;  Surgeon: Emily Filbert, MD;  Location: [facility];  Service: Gynecology;  Laterality: Bilateral;  . Cervical biopsy  w/ loop electrode excision      There were no vitals filed for this visit.  Visit Diagnosis:  Right knee pain  Abnormality of gait  Swelling of right knee joint      Subjective Assessment - 03/14/15 1556    Subjective Sore only.  Has been doing her home exercises.  Less pain with stairs.  Sore only    Currently in Pain? No/denies   Pain Location Knee   Pain Orientation Right   Pain Descriptors / Indicators Sore   Aggravating Factors  squatting.  kneeling on knees and getting up from floor.   Pain Relieving Factors cold, exercises, tylenol.                           [facility] Adult PT Treatment/Exercise - 03/14/15 1559    Knee/Hip Exercises: Stretches   Gastroc Stretch 3 reps;30 seconds  cuse to put more of foot on step for less stretch.   Knee/Hip Exercises: Aerobic   Stationary Bike 5 minutes bike did not turn on.     Knee/Hip Exercises: Standing   Heel Raises 20 reps  RT only, 4 rests brief.   Forward Step Up Limitations rt 10 X sore   Wall Squat 10 reps   1 set 4 X    SLS --  RT moving Lt 3 way 10 X sliding pillow case. gait belt cues.                PT Education - 03/14/15 1623    Education provided Yes   Education Details  wall slide   Person(s) Educated Patient   Methods Explanation;Demonstration;Verbal cues;Handout   Comprehension Verbalized understanding;Returned demonstration          PT Short Term Goals - 03/07/15 1630    PT SHORT TERM GOAL #1   Title pt will be I with inital HEP (03/07/2015)   Time 3   Period Weeks   Status Achieved   PT SHORT TERM GOAL #2   Title pt will be able to verbalize and demonstrate techniques to reduce pain and inflammation via RICE and HEP (03/07/2015)   Baseline uses at home   Time 3   Period Weeks   Status Achieved           PT Long Term Goals - 03/14/15 1630    PT LONG TERM GOAL #1   Title At discharge pt will be I with all HEP given (03/28/2015)   Time 6   Period Weeks   Status On-going   PT LONG TERM GOAL #2   Title pt will be able to navigate up/down >12  steps with < 4/10 pain to assist with community ambulation and ADLs (03/28/2015)   Baseline sore stiff only, no pain   Time 6   Period Weeks    Status Achieved   PT LONG TERM GOAL #3   Title pt will increase her FOTO score to > 59 to assist with improved function at discharge (03/28/2015)   Time 6   Period Weeks   Status Unable to assess   PT LONG TERM GOAL #4   Title pt will be able to walk > 2 blocks to help with job related activities with <4/10 pain (03/28/2015)   Time 6   Period Weeks   Status On-going               Plan - 03/14/15 1627    Clinical Impression Statement Pain much LTG#2 met, pain much improved.  Patient more functional.  pain now intermittant. Patient got tape wet last time and bruised her skin when removing tape.  No tape today   PT Next Visit Plan review wall slide.  Contines closed chain exercises.  Try static lunge?  Tape if needed.   PT Home Exercise Plan wall slides   Consulted and Agree with Plan of Care Patient        Problem List Patient Active Problem List   Diagnosis Date Noted  . Knee pain, right 12/13/2014  . Menorrhagia 12/25/2012  . Anemia 12/25/2012  . Pelvic pain 12/25/2012  . CHEST PAIN UNSPECIFIED 11/05/2008  . ELECTROCARDIOGRAM, ABNORMAL 11/05/2008    [facility] 03/14/2015, 4:33 PM  [facility] [address] [place], [place], [address] Phone: [phone]   Fax:  [phone]     Melvenia Needles, PTA 03/14/2015 4:33 PM Phone: [phone] Fax: [phone] .

## 2015-03-14 NOTE — Patient Instructions (Addendum)
Strengthening: Wall Slide    Leaning on wall, slowly lower buttocks until thighs feel a challenge . Hold _0_to 10 seconds__ seconds. Tighten thigh muscles and return. Repeat __10__ times per set. Do __1 _ sets per session. Do _1___ sessions per day.  Press heels more than toes.  http://orth.exer.us/631   Copyright  VHI. All rights reserved.

## 2015-03-15 ENCOUNTER — Ambulatory Visit: Payer: No Typology Code available for payment source | Attending: Internal Medicine | Admitting: Internal Medicine

## 2015-03-15 ENCOUNTER — Encounter: Payer: Self-pay | Admitting: Internal Medicine

## 2015-03-15 VITALS — BP 131/83 | HR 62 | Temp 97.9°F | Resp 17 | Wt 190.2 lb

## 2015-03-15 DIAGNOSIS — E079 Disorder of thyroid, unspecified: Secondary | ICD-10-CM

## 2015-03-15 DIAGNOSIS — G44209 Tension-type headache, unspecified, not intractable: Secondary | ICD-10-CM

## 2015-03-15 DIAGNOSIS — E785 Hyperlipidemia, unspecified: Secondary | ICD-10-CM

## 2015-03-15 DIAGNOSIS — R42 Dizziness and giddiness: Secondary | ICD-10-CM

## 2015-03-15 LAB — GLUCOSE, POCT (MANUAL RESULT ENTRY): POC Glucose: 93 mg/dl (ref 70–99)

## 2015-03-15 MED ORDER — CYCLOBENZAPRINE HCL 5 MG PO TABS: 5.0000 mg | ORAL_TABLET | Freq: Two times a day (BID) | ORAL | Status: DC | PRN

## 2015-03-15 NOTE — Progress Notes (Signed)
Patient complains of having a headache on and off and also Has been feeling dizzy lately Patient received her flu vaccine at the [facility] building

## 2015-03-15 NOTE — Patient Instructions (Signed)
Stay hydrated and do not go long periods without eating to prevent dizziness Stand up slowly to prevent dizziness

## 2015-03-15 NOTE — Progress Notes (Signed)
Patient ID: Kristen Moore, female   DOB: 01-May-1971, 44 y.o.   MRN: [id number]  CC: headache and dizziness  HPI: Kristen Moore is a 44 y.o. female here today for a follow up visit.  Patient has past medical history of bipolar disorder, IBS, thyroid disease, and anemia. Patient reports that for the past 3 days she has been having dizziness that made her feel like she would pass out. She states that some days she has dizziness even without headaches. Dizziness is most noticeable when she is standing up. Headaches are located on her right temple that is a pressure behind her eyes. Not associated with nausea, vomiting, photophobia, phonophobia. She has been taking tylenol for headaches. She has been out of estriol and has been having hot flashes. Patient states that has been going all day without eating.  She has not had her thyroid checked since last year. She reports missing one week of medication recently.    Allergies  Allergen Reactions  . Latex Swelling  . Penicillins Swelling  . Other Hives and Swelling    Potatoes      Past Medical History  Diagnosis Date  . Bipolar 1 disorder (Home)   . IBS (irritable bowel syndrome)   . Thyroid dysfunction   . Abnormal Pap smear     cryo  . Fibroids   . DUB (dysfunctional uterine bleeding)   . Cyst of breast     left breast  . Hypothyroidism   . Anemia   . Asthma   . OFBPZWCH(852.7)    Current Outpatient Prescriptions on File Prior to Visit  Medication Sig Dispense Refill  . acetaminophen (TYLENOL) 325 MG tablet Take 650 mg by mouth every 6 (six) hours as needed for moderate pain or headache.    . Brexpiprazole (REXULTI) 0.5 MG TABS Take by mouth.    Marland Kitchen buPROPion (WELLBUTRIN SR) 150 MG 12 hr tablet Take 150 mg by mouth daily.    Marland Kitchen buPROPion (ZYBAN) 150 MG 12 hr tablet Take 150 mg by mouth daily.    Marland Kitchen dicyclomine (BENTYL) 20 MG tablet Take 1 tablet (20 mg total) by mouth 4 (four) times daily -  before meals and at bedtime. 90  tablet 2  . estradiol (ESTRACE) 1 MG tablet Take 1 tablet (1 mg total) by mouth daily. 30 tablet 23  . Fish Oil OIL Take 1 tablet by mouth.    . levothyroxine (SYNTHROID, LEVOTHROID) 150 MCG tablet Take 1 tablet (150 mcg total) by mouth daily before breakfast. 30 tablet 2  . lithium carbonate 300 MG capsule Take 300-600 mg by mouth 2 (two) times daily with a meal. Takes 300 mg in the morning at 0800, 300 mg at 1600, and 600 mg at bedtime    . Omega-3 Fatty Acids (FISH OIL) 1000 MG CAPS Take 1,000 mg by mouth daily.    . traZODone (DESYREL) 50 MG tablet Take 50 mg by mouth at bedtime.     No current facility-administered medications on file prior to visit.   Family History  Problem Relation Age of Onset  . Breast cancer Maternal Grandmother   . Heart disease Father   . Cancer Father 78    lung  . Hypertension Mother   . Rheum arthritis Mother   . Fibromyalgia Mother   . COPD Mother   . Sleep apnea Mother   . Breast cancer Paternal Aunt   . Breast cancer Maternal Aunt    Social History   Social History  .  Marital Status: Single    Spouse Name: N/A  . Number of Children: N/A  . Years of Education: N/A   Occupational History  . Not on file.   Social History Main Topics  . Smoking status: Current Every Day Smoker -- 1.00 packs/day for 9 years    Types: Cigarettes  . Smokeless tobacco: Never Used  . Alcohol Use: No  . Drug Use: No  . Sexual Activity: Not Currently    Birth Control/ Protection: Surgical   Other Topics Concern  . Not on file   Social History Narrative    Review of Systems  Musculoskeletal: Positive for joint pain (knee, in PT now). Negative for falls.  Neurological: Positive for dizziness. Negative for speech change, focal weakness and loss of consciousness.  All other systems reviewed and are negative.   Objective:   Filed Vitals:   03/15/15 1658  BP: 131/83  Pulse: 62  Temp:   Resp:     Physical Exam  Constitutional: She is oriented to  person, place, and time.  Eyes: EOM are normal. Pupils are equal, round, and reactive to light. Right eye exhibits no discharge. Left eye exhibits no discharge.  Cardiovascular: Normal rate, regular rhythm and normal heart sounds.   Pulmonary/Chest: Effort normal and breath sounds normal.  Musculoskeletal: She exhibits no edema.  Neurological: She is alert and oriented to person, place, and time. No cranial nerve deficit.  Skin: Skin is warm and dry.  Psychiatric: She has a normal mood and affect.    Lab Results  Component Value Date   WBC 6.7 09/08/2014   HGB 15.5* 09/08/2014   HCT 45.5 09/08/2014   MCV 94.6 09/08/2014   PLT 211 09/08/2014   Lab Results  Component Value Date   CREATININE 0.72 09/08/2014   BUN 11 09/08/2014   NA 139 09/08/2014   K 5.0 09/08/2014   CL 104 09/08/2014   CO2 27 09/08/2014    No results found for: HGBA1C Lipid Panel     Component Value Date/Time   CHOL 190 01/19/2014 0911   TRIG 158* 01/19/2014 0911   HDL 39* 01/19/2014 0911   CHOLHDL 4.9 01/19/2014 0911   VLDL 32 01/19/2014 0911   LDLCALC 119* 01/19/2014 0911       Assessment and plan:   [name] was seen today for dizziness.  Diagnoses and all orders for this visit:  Dizziness -     Vitamin D, 25-hydroxy -     Basic Metabolic Panel -     POCT glucose (manual entry) Stand up slowly, do not go all day without eating, stay hydrated  Tension headache -    Begin cyclobenzaprine (FLEXERIL) 5 MG tablet; Take 1 tablet (5 mg total) by mouth 2 (two) times daily as needed for muscle spasms.  Thyroid disease -     TSH -     T4, Free  HLD (hyperlipidemia) -     Lipid panel She reports that she has not ate since 7am.    Return in about 6 months (around 09/13/2015) for thyroid .       Lance Bosch, Head of [facility] [phone] 03/15/2015, 5:22 PM

## 2015-03-16 ENCOUNTER — Ambulatory Visit: Payer: No Typology Code available for payment source | Admitting: Physical Therapy

## 2015-03-16 DIAGNOSIS — M25561 Pain in right knee: Secondary | ICD-10-CM

## 2015-03-16 DIAGNOSIS — M25461 Effusion, right knee: Secondary | ICD-10-CM

## 2015-03-16 DIAGNOSIS — R269 Unspecified abnormalities of gait and mobility: Secondary | ICD-10-CM

## 2015-03-16 LAB — LIPID PANEL
Cholesterol: 202 mg/dL — ABNORMAL HIGH (ref 125–200)
HDL: 46 mg/dL (ref >=46)
LDL Cholesterol: 127 mg/dL (ref <130)
Total CHOL/HDL Ratio: 4.4 Ratio (ref <=5.0)
Triglycerides: 144 mg/dL (ref <150)
VLDL: 29 mg/dL (ref <30)

## 2015-03-16 LAB — BASIC METABOLIC PANEL
BUN: 15 mg/dL (ref 7–25)
CO2: 28 mmol/L (ref 20–31)
Calcium: 9.7 mg/dL (ref 8.6–10.2)
Chloride: 103 mmol/L (ref 98–110)
Creat: 0.8 mg/dL (ref 0.50–1.10)
Glucose, Bld: 82 mg/dL (ref 65–99)
Potassium: 4.4 mmol/L (ref 3.5–5.3)
Sodium: 139 mmol/L (ref 135–146)

## 2015-03-16 LAB — T4, FREE: Free T4: 1.3 ng/dL (ref 0.80–1.80)

## 2015-03-16 LAB — VITAMIN D 25 HYDROXY (VIT D DEFICIENCY, FRACTURES): Vit D, 25-Hydroxy: 32 ng/mL (ref 30–100)

## 2015-03-16 LAB — TSH: TSH: 2.34 u[IU]/mL (ref 0.350–4.500)

## 2015-03-16 NOTE — Therapy (Signed)
[place], [place], [address] Phone: [phone]   Fax:  [phone]  Physical Therapy Treatment  Patient Details  Name: Kristen Moore MRN: [id number] Date of Birth: 08/12/1970 Referring Provider:  Lance Bosch, NP  Encounter Date: 03/16/2015      PT End of Session - 03/16/15 1550    Visit Number 6   Number of Visits 12   Date for PT Re-Evaluation 03/28/15   PT Start Time 7124   PT Stop Time 1629   PT Time Calculation (min) 39 min   Activity Tolerance Patient tolerated treatment well;No increased pain      Past Medical History  Diagnosis Date  . Bipolar 1 disorder ([place])   . IBS (irritable bowel syndrome)   . Thyroid dysfunction   . Abnormal Pap smear     cryo  . Fibroids   . DUB (dysfunctional uterine bleeding)   . Cyst of breast     left breast  . Hypothyroidism   . Anemia   . Asthma   . PYKDXIPJ(825.0)     Past Surgical History  Procedure Laterality Date  . Cholecystectomy    . Tubal ligation    . Colon surgery    . Dilation and curettage of uterus    . Abdominal hysterectomy Bilateral 12/25/2012    Procedure: HYSTERECTOMY ABDOMINAL;  Surgeon: Emily Filbert, MD;  Location: [facility];  Service: Gynecology;  Laterality: Bilateral;  TAH and bilateral salpingectomy  . Bilateral salpingectomy Bilateral 12/25/2012    Procedure: BILATERAL SALPINGECTOMY;  Surgeon: Emily Filbert, MD;  Location: [facility];  Service: Gynecology;  Laterality: Bilateral;  . Oophorectomy Bilateral 12/25/2012    Procedure: OOPHORECTOMY;  Surgeon: Emily Filbert, MD;  Location: [facility];  Service: Gynecology;  Laterality: Bilateral;  . Cervical biopsy  w/ loop electrode excision      There were no vitals filed for this visit.  Visit Diagnosis:  Right knee pain  Abnormality of gait  Swelling of right knee joint      Subjective Assessment - 03/16/15 1552    Subjective HEP going ok.  Had some pain when kneeling in  bath tub; resolved after a few minutes. Still has difficulty with stairs- goes down stairs with Rt foot, not left. (step to pattern)   Currently in Pain? Yes   Pain Score 5    Pain Location Knee   Pain Orientation Right   Pain Descriptors / Indicators Sore   Aggravating Factors  kneeling, squatting, stairs.    Pain Relieving Factors cold, epsom salt bath, tylenol            [facility] PT Assessment - 03/16/15 0001    Assessment   Medical Diagnosis R knee pain   Next MD Visit PRN            [facility] Adult PT Treatment/Exercise - 03/16/15 0001    Knee/Hip Exercises: Stretches   Passive Hamstring Stretch Right;Left;2 reps;30 seconds   Quad Stretch Right;Left 30 seconds;3 reps,  Prone with strap   Gastroc Stretch Right;Left;2 reps;30 seconds   Soleus Stretch Right;Left;2 reps;30 seconds   Knee/Hip Exercises: Aerobic   Stationary Bike L1: 5 min    Knee/Hip Exercises: Standing   Heel Raises 10 reps;Both;2 sets  (toes straight/ toes out)   Step Down Left;2 sets;10 reps;Hand Hold: 1  3" step, Lt heel taps   Wall Squat 1 set;15 reps  with ball squeeze   Wall Squat Limitations VC  for even wt (leaning Lt)   SLS Rt SLS without support x 25 sec x 2 trials; then horizontal head turns, 2 trials.                 PT Education - 03/16/15 1623    Education provided Yes   Education Details quad stretch and heel taps    Person(s) Educated Patient   Methods Explanation;Handout   Comprehension Verbalized understanding          PT Short Term Goals - 03/07/15 1630    PT SHORT TERM GOAL #1   Title pt will be I with inital HEP (03/07/2015)   Time 3   Period Weeks   Status Achieved   PT SHORT TERM GOAL #2   Title pt will be able to verbalize and demonstrate techniques to reduce pain and inflammation via RICE and HEP (03/07/2015)   Baseline uses at home   Time 3   Period Weeks   Status Achieved           PT Long Term Goals - 03/14/15 1630    PT LONG TERM GOAL #1   Title At  discharge pt will be I with all HEP given (03/28/2015)   Time 6   Period Weeks   Status On-going   PT LONG TERM GOAL #2   Title pt will be able to navigate up/down >12  steps with < 4/10 pain to assist with community ambulation and ADLs (03/28/2015)   Baseline sore stiff only, no pain   Time 6   Period Weeks   Status Achieved   PT LONG TERM GOAL #3   Title pt will increase her FOTO score to > 59 to assist with improved function at discharge (03/28/2015)   Time 6   Period Weeks   Status Unable to assess   PT LONG TERM GOAL #4   Title pt will be able to walk > 2 blocks to help with job related activities with <4/10 pain (03/28/2015)   Time 6   Period Weeks   Status On-going               Plan - 03/16/15 1626    Clinical Impression Statement Pt reported decreased pain in Rt knee (by 2 points) after ther ex.  Pt continues with Rt quad eccentric weakness and quad tightness. Pt tolerated all exercises well; declined use of ice at end - she will perform at home.    Pt will benefit from skilled therapeutic intervention in order to improve on the following deficits Abnormal gait;Pain;Decreased balance;Decreased endurance;Decreased activity tolerance;Decreased mobility;Decreased range of motion;Improper body mechanics;Postural dysfunction;Increased edema   Rehab Potential Good   PT Frequency 2x / week   PT Duration 6 weeks   PT Treatment/Interventions ADLs/Self Care Home Management;Cryotherapy;Electrical Stimulation;Iontophoresis 4mg /ml Dexamethasone;Moist Heat;Ultrasound;Therapeutic activities;Therapeutic exercise;Balance training;Neuromuscular re-education;Patient/family education;Manual techniques;Passive range of motion;Dry needling;Taping;Vasopneumatic Device   PT Next Visit Plan Continue progressive strengthening/stretch to RLE; may try static lunge.  Application of tape if needed.    Consulted and Agree with Plan of Care Patient        Problem List Patient Active Problem  List   Diagnosis Date Noted  . Knee pain, right 12/13/2014  . Menorrhagia 12/25/2012  . Anemia 12/25/2012  . Pelvic pain 12/25/2012  . CHEST PAIN UNSPECIFIED 11/05/2008  . ELECTROCARDIOGRAM, ABNORMAL 11/05/2008    Kerin Perna, PTA 03/16/2015 4:38 PM  [facility] [address] [place], [place], [address] Phone: [phone]  Fax:  [phone]

## 2015-03-16 NOTE — Patient Instructions (Signed)
Anterior Step-Down    Stand with both feet on _3 inch step.Tap Left heel to the floor and return 10_ times. __2_ sets _1__ times per day.  KNEE: Quadriceps - Prone    Place strap around ankle. Bring ankle toward buttocks. Press hip into surface. Hold _30__ seconds. _3_ reps per set, _1-2__ sets per day

## 2015-03-18 ENCOUNTER — Other Ambulatory Visit: Payer: Self-pay | Admitting: Internal Medicine

## 2015-03-18 DIAGNOSIS — E785 Hyperlipidemia, unspecified: Secondary | ICD-10-CM | POA: Insufficient documentation

## 2015-03-18 MED ORDER — LOVASTATIN 10 MG PO TABS: 10.0000 mg | ORAL_TABLET | Freq: Every day | ORAL | Status: DC

## 2015-03-21 ENCOUNTER — Telehealth: Payer: Self-pay | Admitting: Internal Medicine

## 2015-03-21 ENCOUNTER — Telehealth: Payer: Self-pay

## 2015-03-21 ENCOUNTER — Ambulatory Visit: Payer: No Typology Code available for payment source | Admitting: Physical Therapy

## 2015-03-21 DIAGNOSIS — M25461 Effusion, right knee: Secondary | ICD-10-CM

## 2015-03-21 DIAGNOSIS — R269 Unspecified abnormalities of gait and mobility: Secondary | ICD-10-CM

## 2015-03-21 DIAGNOSIS — M25561 Pain in right knee: Secondary | ICD-10-CM

## 2015-03-21 NOTE — Therapy (Signed)
[place], [place], [address] Phone: [phone]   Fax:  [phone]  Physical Therapy Treatment  Patient Details  Name: BLAKLEY MICHNA MRN: [id number] Date of Birth: 09-12-1970 No Data Recorded  Encounter Date: 03/21/2015      PT End of Session - 03/21/15 1728    Visit Number 7   Number of Visits 12   Date for PT Re-Evaluation 03/28/15   PT Start Time 3474  pt arrived 20 minutes late to todays session   PT Stop Time 1645   PT Time Calculation (min) 35 min   Activity Tolerance Patient tolerated treatment well   Behavior During Therapy [facility] for tasks assessed/performed      Past Medical History  Diagnosis Date  . Bipolar 1 disorder ([place])   . IBS (irritable bowel syndrome)   . Thyroid dysfunction   . Abnormal Pap smear     cryo  . Fibroids   . DUB (dysfunctional uterine bleeding)   . Cyst of breast     left breast  . Hypothyroidism   . Anemia   . Asthma   . QVZDGLOV(564.3)     Past Surgical History  Procedure Laterality Date  . Cholecystectomy    . Tubal ligation    . Colon surgery    . Dilation and curettage of uterus    . Abdominal hysterectomy Bilateral 12/25/2012    Procedure: HYSTERECTOMY ABDOMINAL;  Surgeon: Emily Filbert, MD;  Location: [facility];  Service: Gynecology;  Laterality: Bilateral;  TAH and bilateral salpingectomy  . Bilateral salpingectomy Bilateral 12/25/2012    Procedure: BILATERAL SALPINGECTOMY;  Surgeon: Emily Filbert, MD;  Location: [facility];  Service: Gynecology;  Laterality: Bilateral;  . Oophorectomy Bilateral 12/25/2012    Procedure: OOPHORECTOMY;  Surgeon: Emily Filbert, MD;  Location: [facility];  Service: Gynecology;  Laterality: Bilateral;  . Cervical biopsy  w/ loop electrode excision      There were no vitals filed for this visit.  Visit Diagnosis:  Right knee pain  Abnormality of gait  Swelling of right knee joint      Subjective Assessment - 03/21/15  1613    Subjective "I had some pain earlier today in the knee from jumping down from a step and had pain shoot to the mid thigh"   Currently in Pain? Yes   Pain Score 5    Pain Location Knee   Pain Orientation Right   Pain Descriptors / Indicators Aching   Pain Type Chronic pain   Pain Onset More than a month ago   Pain Frequency Intermittent   Aggravating Factors  kneeling, squatting, stairs   Pain Relieving Factors cold, epsom salt bath, tylenol                         [facility] Adult PT Treatment/Exercise - 03/21/15 0001    Vasopneumatic   Number Minutes Vasopneumatic  10 minutes   Vasopnuematic Location  Knee  right   Vasopneumatic Pressure Medium   Vasopneumatic Temperature  32   Manual Therapy   Manual Therapy Taping;Soft tissue mobilization;Myofascial release   Soft tissue mobilization instrument assisted STM over vastus lateralus and rectus femoris   Myofascial Release TPR of R vastus lateralus x 3 spots   McConnell Lateral to Medial taping  R                  PT Short Term Goals - 03/07/15 1630  PT SHORT TERM GOAL #1   Title pt will be I with inital HEP (03/07/2015)   Time 3   Period Weeks   Status Achieved   PT SHORT TERM GOAL #2   Title pt will be able to verbalize and demonstrate techniques to reduce pain and inflammation via RICE and HEP (03/07/2015)   Baseline uses at home   Time 3   Period Weeks   Status Achieved           PT Long Term Goals - 03/14/15 1630    PT LONG TERM GOAL #1   Title At discharge pt will be I with all HEP given (03/28/2015)   Time 6   Period Weeks   Status On-going   PT LONG TERM GOAL #2   Title pt will be able to navigate up/down >12  steps with < 4/10 pain to assist with community ambulation and ADLs (03/28/2015)   Baseline sore stiff only, no pain   Time 6   Period Weeks   Status Achieved   PT LONG TERM GOAL #3   Title pt will increase her FOTO score to > 59 to assist with improved function at  discharge (03/28/2015)   Time 6   Period Weeks   Status Unable to assess   PT LONG TERM GOAL #4   Title pt will be able to walk > 2 blocks to help with job related activities with <4/10 pain (03/28/2015)   Time 6   Period Weeks   Status On-going               Plan - 03/21/15 1729    Clinical Impression Statement pt arrived 20 minutes late today today session. She reported she jumped onto her R leg down one step and had pain shoot up her leg. Due to limited time focused on manual for vastus lateralus tightness and taping to control the patellar glide. She reported relief and stated she would work on getting here on time for her next visit.    PT Next Visit Plan Continue progressive strengthening/stretch to RLE; may try static lunge.  Application of tape if needed.    Consulted and Agree with Plan of Care Patient        Problem List Patient Active Problem List   Diagnosis Date Noted  . HLD (hyperlipidemia) 03/18/2015  . Knee pain, right 12/13/2014  . Menorrhagia 12/25/2012  . Anemia 12/25/2012  . Pelvic pain 12/25/2012  . CHEST PAIN UNSPECIFIED 11/05/2008  . ELECTROCARDIOGRAM, ABNORMAL 11/05/2008   Starr Lake PT, DPT, LAT, ATC  03/21/2015  5:32 PM      [facility] [address] [place], [place], [address] Phone: [phone]   Fax:  [phone]  Name: RAFAELA DINIUS MRN: [id number] Date of Birth: [birth year]

## 2015-03-21 NOTE — Telephone Encounter (Signed)
Patient called and requested her results. Please f/u at this number [phone] or the number for contact.

## 2015-03-21 NOTE — Telephone Encounter (Signed)
Patient would like a call back at [phone]

## 2015-03-21 NOTE — Telephone Encounter (Signed)
Patient called and requested her results. Please f/u at this number [phone] or the number for contact.

## 2015-03-21 NOTE — Telephone Encounter (Signed)
Patient called back, please f/u  °

## 2015-03-21 NOTE — Telephone Encounter (Signed)
Patient not available Unable to leave message

## 2015-03-21 NOTE — Telephone Encounter (Signed)
-----   Message from Lance Bosch, NP sent at 03/18/2015 12:34 PM EDT ----- Cholesterol is slightly elevated. I have sent Lovastatin 10 mg to take daily. Decrease bread, pasta, rice, potatoes, fried foods. Exercise

## 2015-03-22 ENCOUNTER — Telehealth: Payer: Self-pay

## 2015-03-22 NOTE — Telephone Encounter (Signed)
Returned patient phone call to [phone] Patient is aware of her lab results and to pick up her cholesterol medication At [employer] on file

## 2015-03-23 ENCOUNTER — Ambulatory Visit: Payer: No Typology Code available for payment source

## 2015-03-23 ENCOUNTER — Ambulatory Visit: Payer: No Typology Code available for payment source | Attending: Internal Medicine

## 2015-03-23 ENCOUNTER — Ambulatory Visit: Payer: No Typology Code available for payment source | Admitting: Physical Therapy

## 2015-03-23 DIAGNOSIS — M25561 Pain in right knee: Secondary | ICD-10-CM

## 2015-03-23 DIAGNOSIS — R269 Unspecified abnormalities of gait and mobility: Secondary | ICD-10-CM

## 2015-03-23 DIAGNOSIS — M25461 Effusion, right knee: Secondary | ICD-10-CM

## 2015-03-23 NOTE — Therapy (Addendum)
[place], [place], [address] Phone: [phone]   Fax:  [phone]  Physical Therapy Treatment  Patient Details  Name: Kristen Moore MRN: [id number] Date of Birth: 1971/03/27 No Data Recorded  Encounter Date: 03/23/2015      PT End of Session - 03/23/15 1745    Visit Number 8   Number of Visits 12   Date for PT Re-Evaluation 03/28/15   PT Start Time 4650   PT Stop Time 3546   PT Time Calculation (min) 42 min   Activity Tolerance Patient tolerated treatment well;No increased pain;Patient limited by pain   Behavior During Therapy [facility] for tasks assessed/performed      Past Medical History  Diagnosis Date  . Bipolar 1 disorder ([place])   . IBS (irritable bowel syndrome)   . Thyroid dysfunction   . Abnormal Pap smear     cryo  . Fibroids   . DUB (dysfunctional uterine bleeding)   . Cyst of breast     left breast  . Hypothyroidism   . Anemia   . Asthma   . FKCLEXNT(700.1)     Past Surgical History  Procedure Laterality Date  . Cholecystectomy    . Tubal ligation    . Colon surgery    . Dilation and curettage of uterus    . Abdominal hysterectomy Bilateral 12/25/2012    Procedure: HYSTERECTOMY ABDOMINAL;  Surgeon: Emily Filbert, MD;  Location: [facility];  Service: Gynecology;  Laterality: Bilateral;  TAH and bilateral salpingectomy  . Bilateral salpingectomy Bilateral 12/25/2012    Procedure: BILATERAL SALPINGECTOMY;  Surgeon: Emily Filbert, MD;  Location: [facility];  Service: Gynecology;  Laterality: Bilateral;  . Oophorectomy Bilateral 12/25/2012    Procedure: OOPHORECTOMY;  Surgeon: Emily Filbert, MD;  Location: [facility];  Service: Gynecology;  Laterality: Bilateral;  . Cervical biopsy  w/ loop electrode excision      There were no vitals filed for this visit.  Visit Diagnosis:  Right knee pain  Abnormality of gait  Swelling of right knee joint      Subjective Assessment - 03/23/15 1614     Subjective 6/10   Currently in Pain? Yes   Pain Score 6    Pain Orientation Right   Pain Descriptors / Indicators Aching   Pain Type Chronic pain   Aggravating Factors  waklking   Pain Relieving Factors cold manual.   Multiple Pain Sites No                         [facility] Adult PT Treatment/Exercise - 03/23/15 1615    Knee/Hip Exercises: Supine   Quad Sets 10 reps   Short Arc Quad Sets 10 reps  3 sets ball squeeze, 3 LBS   Heel Slides 10 reps   Straight Leg Raises 10 reps   Straight Leg Raises Limitations back pain 8/10    Knee/Hip Exercises: Sidelying   Hip ABduction 5 reps  hip pain reported, limiting   Hip ADduction 5 reps  painful hip   Clams --  5 reps, cues painful at hip   Manual Therapy   Manual Therapy Taping   Soft tissue mobilization instrument assisted STM over vastus lateralus and rectus femoris   McConnell Lateral to Medial taping  skin prep uses and wrap given to use with tape for bath.  PT Short Term Goals - 03/07/15 1630    PT SHORT TERM GOAL #1   Title pt will be I with inital HEP (03/07/2015)   Time 3   Period Weeks   Status Achieved   PT SHORT TERM GOAL #2   Title pt will be able to verbalize and demonstrate techniques to reduce pain and inflammation via RICE and HEP (03/07/2015)   Baseline uses at home   Time 3   Period Weeks   Status Achieved           PT Long Term Goals - 03/23/15 1747    PT LONG TERM GOAL #1   Title At discharge pt will be I with all HEP given (03/28/2015)   Time 6   Period Weeks   Status On-going   PT LONG TERM GOAL #2   Title pt will be able to navigate up/down >12  steps with < 4/10 pain to assist with community ambulation and ADLs (03/28/2015)   Time 6   Period Weeks   Status Achieved   PT LONG TERM GOAL #3   Title pt will increase her FOTO score to > 59 to assist with improved function at discharge (03/28/2015)   Time 6   Period Weeks   Status Unable to assess    PT LONG TERM GOAL #4   Title pt will be able to walk > 2 blocks to help with job related activities with <4/10 pain (03/28/2015)   Time 6   Period Weeks   Status On-going               Plan - 03/23/15 1745    Clinical Impression Statement patient requested manual and taping.  Pain with basic exercises improved with tape.  She was on time this visit.     PT Next Visit Plan Static lunge, tape first,     Consulted and Agree with Plan of Care Patient        Problem List Patient Active Problem List   Diagnosis Date Noted  . HLD (hyperlipidemia) 03/18/2015  . Knee pain, right 12/13/2014  . Menorrhagia 12/25/2012  . Anemia 12/25/2012  . Pelvic pain 12/25/2012  . CHEST PAIN UNSPECIFIED 11/05/2008  . ELECTROCARDIOGRAM, ABNORMAL 11/05/2008    Kristen Moore 03/23/2015, 5:49 PM  [facility] [address] [place], [place], [address] Phone: [phone]   Fax:  [phone]  Name: Kristen Moore MRN: [id number] Date of Birth: 02-11-1971    Melvenia Needles, PTA 03/23/2015 5:49 PM Phone: [phone] Fax: [phone]    PHYSICAL THERAPY DISCHARGE SUMMARY  Visits from Start of Care: 8  Current functional level related to goals / functional outcomes: See goals   Remaining deficits: Deficits unknown due to not returning since last attended visit.   Education / Equipment: HEP  Plan:                                                    Patient goals were not met. Patient is being discharged due to not returning since the last visit.  ?????        Kristoffer Leamon PT, DPT, LAT, ATC  04/20/2015  9:15 AM

## 2015-03-28 ENCOUNTER — Other Ambulatory Visit: Payer: Self-pay | Admitting: Obstetrics and Gynecology

## 2015-03-28 DIAGNOSIS — N6002 Solitary cyst of left breast: Secondary | ICD-10-CM

## 2015-03-28 DIAGNOSIS — N6001 Solitary cyst of right breast: Secondary | ICD-10-CM

## 2015-03-31 ENCOUNTER — Encounter (HOSPITAL_COMMUNITY): Payer: Self-pay

## 2015-03-31 ENCOUNTER — Ambulatory Visit: Payer: No Typology Code available for payment source | Admitting: Internal Medicine

## 2015-03-31 ENCOUNTER — Emergency Department (HOSPITAL_COMMUNITY)
Admission: EM | Admit: 2015-03-31 | Discharge: 2015-03-31 | Disposition: A | Discharge status: Home / Self Care | Payer: No Typology Code available for payment source | Attending: Emergency Medicine | Admitting: Emergency Medicine

## 2015-03-31 DIAGNOSIS — J45909 Unspecified asthma, uncomplicated: Secondary | ICD-10-CM | POA: Insufficient documentation

## 2015-03-31 DIAGNOSIS — K589 Irritable bowel syndrome without diarrhea: Secondary | ICD-10-CM | POA: Insufficient documentation

## 2015-03-31 DIAGNOSIS — Z720 Tobacco use: Secondary | ICD-10-CM | POA: Insufficient documentation

## 2015-03-31 DIAGNOSIS — E039 Hypothyroidism, unspecified: Secondary | ICD-10-CM | POA: Insufficient documentation

## 2015-03-31 DIAGNOSIS — Z3202 Encounter for pregnancy test, result negative: Secondary | ICD-10-CM | POA: Insufficient documentation

## 2015-03-31 DIAGNOSIS — F319 Bipolar disorder, unspecified: Secondary | ICD-10-CM | POA: Insufficient documentation

## 2015-03-31 DIAGNOSIS — Z79899 Other long term (current) drug therapy: Secondary | ICD-10-CM | POA: Insufficient documentation

## 2015-03-31 DIAGNOSIS — N390 Urinary tract infection, site not specified: Secondary | ICD-10-CM

## 2015-03-31 DIAGNOSIS — Z862 Personal history of diseases of the blood and blood-forming organs and certain disorders involving the immune mechanism: Secondary | ICD-10-CM | POA: Insufficient documentation

## 2015-03-31 DIAGNOSIS — Z8742 Personal history of other diseases of the female genital tract: Secondary | ICD-10-CM | POA: Insufficient documentation

## 2015-03-31 DIAGNOSIS — Z86018 Personal history of other benign neoplasm: Secondary | ICD-10-CM | POA: Insufficient documentation

## 2015-03-31 DIAGNOSIS — Z91040 Latex allergy status: Secondary | ICD-10-CM | POA: Insufficient documentation

## 2015-03-31 DIAGNOSIS — Z880 Allergy status to penicillin: Secondary | ICD-10-CM | POA: Insufficient documentation

## 2015-03-31 DIAGNOSIS — R319 Hematuria, unspecified: Secondary | ICD-10-CM

## 2015-03-31 LAB — URINE MICROSCOPIC-ADD ON

## 2015-03-31 LAB — URINALYSIS, ROUTINE W REFLEX MICROSCOPIC
Bilirubin Urine: NEGATIVE
Glucose, UA: NEGATIVE mg/dL
Ketones, ur: NEGATIVE mg/dL
Nitrite: NEGATIVE
Protein, ur: NEGATIVE mg/dL
Specific Gravity, Urine: 1.01 (ref 1.005–1.030)
Urobilinogen, UA: 0.2 mg/dL (ref 0.0–1.0)
pH: 6.5 (ref 5.0–8.0)

## 2015-03-31 LAB — WET PREP, GENITAL
Clue Cells Wet Prep HPF POC: NONE SEEN
Trich, Wet Prep: NONE SEEN
Yeast Wet Prep HPF POC: NONE SEEN

## 2015-03-31 LAB — POC URINE PREG, ED: Preg Test, Ur: NEGATIVE

## 2015-03-31 MED ORDER — NITROFURANTOIN MONOHYD MACRO 100 MG PO CAPS: 100.0000 mg | ORAL_CAPSULE | Freq: Two times a day (BID) | ORAL | Status: DC

## 2015-03-31 NOTE — ED Provider Notes (Signed)
CSN: [id number]     Arrival date & time 03/31/15  0848 History   First MD Initiated Contact with Patient 03/31/15 [phone]     Chief Complaint  Patient presents with  . Hematuria    HPI   [name] is a 44 y.o. female with h/o bipolar d/o, HLD, IBS, s/p TAH who presents to the ED for evaluation of dysuria and hematuria. She states her symptoms began one week ago. She states that she has mild lower abdominal pain that is associated with dysuria and urinary frequency. She states that her symptoms initially got better but have gotten worse again. She also states that she noticed some bloody and yellow mucous discharge when wiping. She is unable to tell whether the blood was in her urine or was vaginal. She denies vaginal discharge in her underwear. Denies fever, chills, N/V/D, new vulvovaginal itching or discharge. She states she has had UTIs in the past and has a history of BV. Denies recent sexual activity.   Past Medical History  Diagnosis Date  . Bipolar 1 disorder ([place])   . IBS (irritable bowel syndrome)   . Thyroid dysfunction   . Abnormal Pap smear     cryo  . Fibroids   . DUB (dysfunctional uterine bleeding)   . Cyst of breast     left breast  . Hypothyroidism   . Anemia   . Asthma   . LEXNTZGY(174.9)    Past Surgical History  Procedure Laterality Date  . Cholecystectomy    . Tubal ligation    . Colon surgery    . Dilation and curettage of uterus    . Abdominal hysterectomy Bilateral 12/25/2012    Procedure: HYSTERECTOMY ABDOMINAL;  Surgeon: Emily Filbert, MD;  Location: [facility];  Service: Gynecology;  Laterality: Bilateral;  TAH and bilateral salpingectomy  . Bilateral salpingectomy Bilateral 12/25/2012    Procedure: BILATERAL SALPINGECTOMY;  Surgeon: Emily Filbert, MD;  Location: [facility];  Service: Gynecology;  Laterality: Bilateral;  . Oophorectomy Bilateral 12/25/2012    Procedure: OOPHORECTOMY;  Surgeon: Emily Filbert, MD;  Location: [facility];  Service: Gynecology;  Laterality:  Bilateral;  . Cervical biopsy  w/ loop electrode excision     Family History  Problem Relation Age of Onset  . Breast cancer Maternal Grandmother   . Heart disease Father   . Cancer Father 82    lung  . Hypertension Mother   . Rheum arthritis Mother   . Fibromyalgia Mother   . COPD Mother   . Sleep apnea Mother   . Breast cancer Paternal Aunt   . Breast cancer Maternal Aunt    Social History  Substance Use Topics  . Smoking status: Current Every Day Smoker -- 1.00 packs/day for 9 years    Types: Cigarettes  . Smokeless tobacco: Never Used  . Alcohol Use: No   OB History    Gravida Para Term Preterm AB TAB SAB Ectopic Multiple Living   3 2 1 1 1 1    1      Review of Systems  All other systems reviewed and are negative.     Allergies  Latex; Penicillins; and Other  Home Medications   Prior to Admission medications   Medication Sig Start Date End Date Taking? Authorizing Provider  buPROPion (WELLBUTRIN XL) 150 MG 24 hr tablet Take 150 mg by mouth daily.   Yes Historical Provider, MD  cyclobenzaprine (FLEXERIL) 5 MG tablet Take 1 tablet (5 mg total) by mouth  2 (two) times daily as needed for muscle spasms. 03/15/15  Yes Lance Bosch, NP  dicyclomine (BENTYL) 20 MG tablet Take 1 tablet (20 mg total) by mouth 4 (four) times daily -  before meals and at bedtime. 11/23/14  Yes Lance Bosch, NP  estradiol (ESTRACE) 1 MG tablet Take 1 tablet (1 mg total) by mouth daily. 05/12/14  Yes Emily Filbert, MD  levothyroxine (SYNTHROID, LEVOTHROID) 150 MCG tablet Take 1 tablet (150 mcg total) by mouth daily before breakfast. 01/25/15  Yes Lance Bosch, NP  lithium carbonate 300 MG capsule Take 300-600 mg by mouth 2 (two) times daily with a meal. Takes 300 mg in the morning at 0800, 300 mg at 1600, and 600 mg at bedtime   Yes Historical Provider, MD  lovastatin (MEVACOR) 10 MG tablet Take 1 tablet (10 mg total) by mouth at bedtime. For cholesterol 03/18/15  Yes Lance Bosch, NP   traZODone (DESYREL) 50 MG tablet Take 50 mg by mouth at bedtime.   Yes Historical Provider, MD   BP 120/71 mmHg  Pulse 58  Temp(Src) 97.8 F (36.6 C) (Oral)  Ht 5\' 4"  (1.626 m)  Wt 190 lb (86.183 kg)  BMI 32.60 kg/m2  SpO2 99%  LMP 11/29/2012 Physical Exam  Constitutional: She is oriented to person, place, and time.  HENT:  Right Ear: External ear normal.  Left Ear: External ear normal.  Nose: Nose normal.  Mouth/Throat: Oropharynx is clear and moist. No oropharyngeal exudate.  Eyes: Conjunctivae and EOM are normal. Pupils are equal, round, and reactive to light.  Neck: Normal range of motion. Neck supple.  Cardiovascular: Normal rate, regular rhythm, normal heart sounds and intact distal pulses.   No murmur heard. Pulmonary/Chest: Effort normal and breath sounds normal. No respiratory distress.  Abdominal: Soft. Bowel sounds are normal. She exhibits no distension. There is no rebound and no guarding.  Abdomen diffusely tender to palpation but no peritoneal signs  Genitourinary:  No external lesions. Vaginal canal with thin white discharge with a few white clumps. Cervix WNL. No cervical motion tenderness or adnexal tenderness.   Musculoskeletal: She exhibits no edema.  Lymphadenopathy:    She has no cervical adenopathy.  Neurological: She is alert and oriented to person, place, and time. No cranial nerve deficit.  Skin: Skin is warm and dry.  Psychiatric:  Slightly off/intense affect but otherwise A&O and appropriate.  Nursing note and vitals reviewed.   ED Course  Procedures (including critical care time) Labs Review Labs Reviewed  WET PREP, GENITAL - Abnormal; Notable for the following:    WBC, Wet Prep HPF POC FEW (*)    All other components within normal limits  URINALYSIS, ROUTINE W REFLEX MICROSCOPIC (NOT AT [facility]) - Abnormal; Notable for the following:    APPearance CLOUDY (*)    Hgb urine dipstick SMALL (*)    Leukocytes, UA LARGE (*)    All other components  within normal limits  URINE MICROSCOPIC-ADD ON - Abnormal; Notable for the following:    Bacteria, UA FEW (*)    All other components within normal limits  POC URINE PREG, ED  WET PREP  (BD AFFIRM) ([place])  GC/CHLAMYDIA PROBE AMP ([place]) NOT AT [facility]    Imaging Review No results found. I have personally reviewed and evaluated these images and lab results as part of my medical decision-making.   EKG Interpretation None      MDM   Final diagnoses:  Urinary tract  infection with hematuria, site unspecified   I suspect this is cystitis / UTI. However, pt is unable to clearly describe her bleeding/discharge and given history of BV I dicussed with pt option of pelvic exam today. She agreed to exam. Wet prep and gc/chlamydia swabs taken.Marland Kitchen    UA shows small hgb, large leuks, few bacteria. Given UA and symptoms, will treat UTI. Pt allergic to PCN (rash, hives, throat swelling) so will give macrobid. Wet prep shows WBC but no signs of BV, trich, or yeast. Pt instructed to f/u with PCP and ER return precautions given.   Anne Ng, PA-C 03/31/15 1103  Debby Freiberg, MD 04/06/15 [phone]

## 2015-03-31 NOTE — ED Notes (Signed)
Pt ambulated to the bathroom with ease 

## 2015-03-31 NOTE — Discharge Instructions (Signed)
Urinary Tract Infection Urinary tract infections (UTIs) can develop anywhere along your urinary tract. Your urinary tract is your body's drainage system for removing wastes and extra water. Your urinary tract includes two kidneys, two ureters, a bladder, and a urethra. Your kidneys are a pair of bean-shaped organs. Each kidney is about the size of your fist. They are located below your ribs, one on each side of your spine. CAUSES Infections are caused by microbes, which are microscopic organisms, including fungi, viruses, and bacteria. These organisms are so small that they can only be seen through a microscope. Bacteria are the microbes that most commonly cause UTIs. SYMPTOMS  Symptoms of UTIs may vary by age and gender of the patient and by the location of the infection. Symptoms in young women typically include a frequent and intense urge to urinate and a painful, burning feeling in the bladder or urethra during urination. Older women and men are more likely to be tired, shaky, and weak and have muscle aches and abdominal pain. A fever may mean the infection is in your kidneys. Other symptoms of a kidney infection include pain in your back or sides below the ribs, nausea, and vomiting. DIAGNOSIS To diagnose a UTI, your caregiver will ask you about your symptoms. Your caregiver will also ask you to provide a urine sample. The urine sample will be tested for bacteria and white blood cells. White blood cells are made by your body to help fight infection. TREATMENT  Typically, UTIs can be treated with medication. Because most UTIs are caused by a bacterial infection, they usually can be treated with the use of antibiotics. The choice of antibiotic and length of treatment depend on your symptoms and the type of bacteria causing your infection. HOME CARE INSTRUCTIONS  If you were prescribed antibiotics, take them exactly as your caregiver instructs you. Finish the medication even if you feel better after  you have only taken some of the medication.  Drink enough water and fluids to keep your urine clear or pale yellow.  Avoid caffeine, tea, and carbonated beverages. They tend to irritate your bladder.  Empty your bladder often. Avoid holding urine for long periods of time.  Empty your bladder before and after sexual intercourse.  After a bowel movement, women should cleanse from front to back. Use each tissue only once. SEEK MEDICAL CARE IF:   You have back pain.  You develop a fever.  Your symptoms do not begin to resolve within 3 days. SEEK IMMEDIATE MEDICAL CARE IF:   You have severe back pain or lower abdominal pain.  You develop chills.  You have nausea or vomiting.  You have continued burning or discomfort with urination. MAKE SURE YOU:   Understand these instructions.  Will watch your condition.  Will get help right away if you are not doing well or get worse.   This information is not intended to replace advice given to you by your health care provider. Make sure you discuss any questions you have with your health care provider.   Document Released: 02/28/2005 Document Revised: 02/09/2015 Document Reviewed: 06/29/2011 Elsevier Interactive Patient Education [employer].   Please obtain all of your results from medical records or have your doctors office obtain the results - share them with your doctor - you should be seen at your doctors office in the next 2 days. Call today to arrange your follow up. Take the medications as prescribed. Please review all of the medicines and only take them  if you do not have an allergy to them. Please be aware that if you are taking birth control pills, taking other prescriptions, ESPECIALLY ANTIBIOTICS may make the birth control ineffective - if this is the case, either do not engage in sexual activity or use alternative methods of birth control such as condoms until you have finished the medicine and your family doctor says it  is OK to restart them. If you are on a blood thinner such as COUMADIN, be aware that any other medicine that you take may cause the coumadin to either work too much, or not enough - you should have your coumadin level rechecked in next 7 days if this is the case.  ?  It is also a possibility that you have an allergic reaction to any of the medicines that you have been prescribed - Everybody reacts differently to medications and while MOST people have no trouble with most medicines, you may have a reaction such as nausea, vomiting, rash, swelling, shortness of breath. If this is the case, please stop taking the medicine immediately and contact your physician.  ?  You should return to the ER if you develop severe or worsening symptoms.

## 2015-03-31 NOTE — ED Notes (Signed)
Pt. Noticed around a week ago that she had dysuria. She forgot to mention it to her PCP. Pt. Reports noticing pink urine and pelvic pain last night with continued dysuria.

## 2015-04-01 LAB — GC/CHLAMYDIA PROBE AMP (~~LOC~~) NOT AT ARMC
Chlamydia: NEGATIVE
Neisseria Gonorrhea: NEGATIVE

## 2015-04-14 ENCOUNTER — Telehealth: Payer: Self-pay | Admitting: Physical Therapy

## 2015-04-14 NOTE — Telephone Encounter (Signed)
LVM in regarding not being seen for awhile and no visits scheduled if she still need physical therapy for if she wanted to be discharge to give [name] a call and let us know and if we didn't hear from her we would discharge her from PT.

## 2015-04-25 ENCOUNTER — Ambulatory Visit
Admission: RE | Admit: 2015-04-25 | Discharge: 2015-04-25 | Disposition: A | Discharge status: Home / Self Care | Payer: No Typology Code available for payment source | Source: Ambulatory Visit | Attending: Obstetrics and Gynecology | Admitting: Obstetrics and Gynecology

## 2015-04-25 DIAGNOSIS — N6002 Solitary cyst of left breast: Secondary | ICD-10-CM

## 2015-04-25 DIAGNOSIS — N6001 Solitary cyst of right breast: Secondary | ICD-10-CM

## 2015-05-02 ENCOUNTER — Other Ambulatory Visit: Payer: Self-pay | Admitting: Internal Medicine

## 2015-05-13 ENCOUNTER — Ambulatory Visit: Payer: No Typology Code available for payment source | Admitting: Internal Medicine

## 2015-05-18 ENCOUNTER — Encounter: Payer: Self-pay | Admitting: Internal Medicine

## 2015-05-18 ENCOUNTER — Ambulatory Visit: Payer: Self-pay | Attending: Internal Medicine | Admitting: Internal Medicine

## 2015-05-18 VITALS — BP 132/87 | HR 54 | Temp 98.2°F | Resp 18 | Ht 64.0 in | Wt 190.2 lb

## 2015-05-18 DIAGNOSIS — M25562 Pain in left knee: Secondary | ICD-10-CM

## 2015-05-18 DIAGNOSIS — R21 Rash and other nonspecific skin eruption: Secondary | ICD-10-CM

## 2015-05-18 DIAGNOSIS — K5900 Constipation, unspecified: Secondary | ICD-10-CM

## 2015-05-18 MED ORDER — POLYETHYLENE GLYCOL 3350 17 GM/SCOOP PO POWD
17.0000 g | Freq: Every day | ORAL | Status: DC
Start: 2015-05-18 — End: 2016-06-05

## 2015-05-18 NOTE — Patient Instructions (Signed)
Make ortho appointment soon for knee pain  I have sent Miralax to pharmacy. Please take daily to help with constipation. Drink plenty of water--4-5 bottles per day

## 2015-05-18 NOTE — Progress Notes (Signed)
Patient ID: Kristen Moore, female   DOB: 1971/05/14, 44 y.o.   MRN: [id number]  CC: constipation  HPI: Kristen Moore is a 44 y.o. female here today for a follow up visit.  Patient has past medical history of bipolar disorder, IBS, thyroid disease, DUB, and asthma. Patient reports that she continues to have constipation. Her last bowel movement was two days ago which caused her some pain in her right lower quadrant. She takes miralax as needed.  Patient reports a intermittent rash on her inner thighs that have now resolved. Rash is usually red and not pruritic. She has tried OTC benadryl and hydrocortisone cream.  Patient is concerned about left knee pain that has persisted for several months. She often has swelling. Pain is achy and is exacerbated by walking and standing for long periods of time.    Allergies  Allergen Reactions  . Latex Swelling  . Penicillins Swelling   Past Medical History  Diagnosis Date  . Bipolar 1 disorder ([place])   . IBS (irritable bowel syndrome)   . Thyroid dysfunction   . Abnormal Pap smear     cryo  . Fibroids   . DUB (dysfunctional uterine bleeding)   . Cyst of breast     left breast  . Hypothyroidism   . Anemia   . Asthma   . [id number]    Current Outpatient Prescriptions on File Prior to Visit  Medication Sig Dispense Refill  . buPROPion (WELLBUTRIN XL) 150 MG 24 hr tablet Take 150 mg by mouth daily.    . cyclobenzaprine (FLEXERIL) 5 MG tablet Take 1 tablet (5 mg total) by mouth 2 (two) times daily as needed for muscle spasms. 30 tablet 0  . dicyclomine (BENTYL) 20 MG tablet Take 1 tablet (20 mg total) by mouth 4 (four) times daily -  before meals and at bedtime. 90 tablet 2  . estradiol (ESTRACE) 1 MG tablet Take 1 tablet (1 mg total) by mouth daily. 30 tablet 23  . levothyroxine (SYNTHROID, LEVOTHROID) 150 MCG tablet TAKE ONE TABLET BY MOUTH ONCE DAILY BEFORE BREAKFAST 90 tablet 0  . levothyroxine (SYNTHROID, LEVOTHROID) 150 MCG  tablet TAKE ONE TABLET BY MOUTH DAILY BEFORE BREAKFAST 30 tablet 0  . lithium carbonate 300 MG capsule Take 300-600 mg by mouth 2 (two) times daily with a meal. Takes 300 mg in the morning at 0800, 300 mg at 1600, and 600 mg at bedtime    . lovastatin (MEVACOR) 10 MG tablet Take 1 tablet (10 mg total) by mouth at bedtime. For cholesterol 30 tablet 4  . nitrofurantoin, macrocrystal-monohydrate, (MACROBID) 100 MG capsule Take 1 capsule (100 mg total) by mouth 2 (two) times daily. 14 capsule 0  . traZODone (DESYREL) 50 MG tablet Take 50 mg by mouth at bedtime.     No current facility-administered medications on file prior to visit.   Family History  Problem Relation Age of Onset  . Breast cancer Maternal Grandmother   . Heart disease Father   . Cancer Father 59    lung  . Hypertension Mother   . Rheum arthritis Mother   . Fibromyalgia Mother   . COPD Mother   . Sleep apnea Mother   . Breast cancer Paternal Aunt   . Breast cancer Maternal Aunt    Social History   Social History  . Marital Status: Single    Spouse Name: N/A  . Number of Children: N/A  . Years of Education: N/A   Occupational History  .  Not on file.   Social History Main Topics  . Smoking status: Current Every Day Smoker -- 1.00 packs/day for 9 years    Types: Cigarettes  . Smokeless tobacco: Never Used  . Alcohol Use: No  . Drug Use: No  . Sexual Activity: Not Currently    Birth Control/ Protection: Surgical   Other Topics Concern  . Not on file   Social History Narrative    Review of Systems: Other than what is stated in HPI, all other systems are negative.     Objective:   Filed Vitals:   05/18/15 1442  BP: 132/87  Pulse: 54  Temp: 98.2 F (36.8 C)  Resp: 18    Physical Exam  Cardiovascular: Normal rate, regular rhythm and normal heart sounds.   Pulmonary/Chest: Effort normal and breath sounds normal.  Abdominal: Soft. Bowel sounds are normal. She exhibits no distension. There is no  tenderness.  Musculoskeletal: Normal range of motion. She exhibits tenderness (left knee). She exhibits no edema.  Skin: Skin is warm and dry. No rash noted.  Psychiatric: She has a normal mood and affect.     Lab Results  Component Value Date   WBC 6.7 09/08/2014   HGB 15.5* 09/08/2014   HCT 45.5 09/08/2014   MCV 94.6 09/08/2014   PLT 211 09/08/2014   Lab Results  Component Value Date   CREATININE 0.80 03/15/2015   BUN 15 03/15/2015   NA 139 03/15/2015   K 4.4 03/15/2015   CL 103 03/15/2015   CO2 28 03/15/2015    No results found for: HGBA1C Lipid Panel     Component Value Date/Time   CHOL 202* 03/15/2015 1742   TRIG 144 03/15/2015 1742   HDL 46 03/15/2015 1742   CHOLHDL 4.4 03/15/2015 1742   VLDL 29 03/15/2015 1742   LDLCALC 127 03/15/2015 1742       Assessment and plan:   Kristen Moore was seen today for follow-up.  Diagnoses and all orders for this visit:  Constipation, unspecified constipation type -     Refilled polyethylene glycol powder (GLYCOLAX/MIRALAX) powder; Take 17 g by mouth daily. I have advised patient to use miralax daily to help with constipation  Left knee pain Once she gets [facility] discount, I will refer her to Orthopedics  Rash and nonspecific skin eruption I do not see a rash today but she may continue using Hydrocortisone cream as needed   Return if symptoms worsen or fail to improve.       Lance Bosch, [facility] [phone] 05/18/2015, 2:42 PM

## 2015-05-18 NOTE — Progress Notes (Signed)
Patient here for constipation FU.  Patient denies pain at this time. Patient has had BM on 05/16/15. Patient states she has intermittent pain in her right now for years.  Patient complains of rash on inner right thigh being red and itching. Rash moved to backside and then right side. Patient states the sites come and go. Patient used OTC Benadryl spray.  Patient also has a new boyfriend which has helped with her mood.  Patient requesting refill on Wellbutrin.  Patient stopped losartan two weeks ago because she felt as though the medication was contributing to her constipation.

## 2015-06-07 ENCOUNTER — Telehealth: Payer: Self-pay | Admitting: Internal Medicine

## 2015-06-07 MED ORDER — LEVOTHYROXINE SODIUM 150 MCG PO TABS: ORAL_TABLET | ORAL | Status: DC

## 2015-06-07 MED FILL — ?LEVOTHYROXINE 150 MCG TAB: 150 | 30 days supply | Qty: 30 | Fill #0

## 2015-06-07 NOTE — Telephone Encounter (Signed)
Medication Refill: levothyroxine (SYNTHROID, LEVOTHROID) 150 MCG tablet

## 2015-06-07 NOTE — Telephone Encounter (Signed)
Spoke with patient and she is aware i [facility] a [facility] request

## 2015-06-15 MED FILL — ?BUPROPION HCL XL 300 MG TA: 300 | 30 days supply | Qty: 30 | Fill #1

## 2015-06-20 ENCOUNTER — Telehealth: Payer: Self-pay | Admitting: *Deleted

## 2015-06-20 DIAGNOSIS — E8940 Asymptomatic postprocedural ovarian failure: Secondary | ICD-10-CM

## 2015-06-20 DIAGNOSIS — Z90710 Acquired absence of both cervix and uterus: Secondary | ICD-10-CM

## 2015-06-20 MED ORDER — ESTRADIOL 1 MG PO TABS: 1.0000 mg | ORAL_TABLET | Freq: Every day | ORAL | Status: DC

## 2015-06-20 MED FILL — ?lovastatin 10 MG TABLET: 10 | 30 days supply | Qty: 30 | Fill #1

## 2015-06-20 MED FILL — ?ESTRADIOL 1MG TABLET: 1 | 30 days supply | Qty: 30 | Fill #0

## 2015-06-20 NOTE — Telephone Encounter (Signed)
Kristen Moore called and left a message asking for a refill of her estradiol.    Called [name] and she states she has switched meds to [name] and both [facility] have told her the prescription expired.  Per chart review was last refilled 05/12/14.  Informed her I would ask for refill from Dr. Hulan Fray and send to [facility], she should call them tomorrow and see if it is ready.I will call her if refill not approved.  Discussed with Dr. Hulan Fray and refill approved and sent to her pharmacy.

## 2015-07-05 MED FILL — ?LEVOTHYROXINE 150 MCG TAB: 150 | 30 days supply | Qty: 30 | Fill #1

## 2015-07-19 MED FILL — ?ESTRADIOL 1MG TABLET: 1 | 30 days supply | Qty: 30 | Fill #1

## 2015-07-19 MED FILL — ?BUPROPION HCL XL 300 MG TA: 300 | 30 days supply | Qty: 30 | Fill #2

## 2015-07-19 MED FILL — ?lovastatin 10 MG TABLET: 10 | 30 days supply | Qty: 30 | Fill #2

## 2015-08-04 MED FILL — ?LEVOTHYROXINE 150 MCG TAB: 150 | 30 days supply | Qty: 30 | Fill #2

## 2015-08-16 MED FILL — BUPROPION HCL XL 300 MG TAB: 300 | 30 days supply | Qty: 30 | Fill #3

## 2015-08-16 MED FILL — ?lovastatin 10 MG TABLET: 10 | 30 days supply | Qty: 30 | Fill #3

## 2015-08-16 MED FILL — ?ESTRADIOL 1MG TABLET: 1 | 30 days supply | Qty: 30 | Fill #2

## 2015-09-06 ENCOUNTER — Telehealth: Payer: Self-pay

## 2015-09-06 ENCOUNTER — Telehealth: Payer: Self-pay | Admitting: Internal Medicine

## 2015-09-06 MED ORDER — LEVOTHYROXINE SODIUM 150 MCG PO TABS: ORAL_TABLET | ORAL | Status: DC

## 2015-09-06 MED FILL — ?LEVOTHYROXINE 150 MCG TAB: 150 | 30 days supply | Qty: 30 | Fill #0

## 2015-09-06 NOTE — Telephone Encounter (Signed)
Returned patient phone call Home number listed is not a working number Call the cell number list Left message on voice mail to return our all

## 2015-09-06 NOTE — Telephone Encounter (Signed)
Pt. Returned call. Please return call.

## 2015-09-06 NOTE — Telephone Encounter (Signed)
Pt. Called requesting a med refill on levothyroxine (SYNTHROID, LEVOTHROID) 150 MCG tablet. Please f/u

## 2015-09-14 ENCOUNTER — Ambulatory Visit: Payer: No Typology Code available for payment source | Admitting: Internal Medicine

## 2015-09-19 MED FILL — BUPROPION HCL XL 300 MG TAB: 300 | 30 days supply | Qty: 30 | Fill #4

## 2015-09-20 MED FILL — ?ESTRADIOL 1MG TABLET: 1 | 30 days supply | Qty: 30 | Fill #3

## 2015-09-20 MED FILL — ?lovastatin 10 MG TABLET: 10 | 30 days supply | Qty: 30 | Fill #4

## 2015-10-03 MED FILL — !SYNTHROID 150 MCG TABLET: 150 | 30 days supply | Qty: 30 | Fill #1

## 2015-10-10 ENCOUNTER — Ambulatory Visit: Payer: No Typology Code available for payment source | Admitting: Family Medicine

## 2015-10-17 ENCOUNTER — Other Ambulatory Visit: Payer: Self-pay | Admitting: Internal Medicine

## 2015-10-17 MED ORDER — LEVOTHYROXINE SODIUM 150 MCG PO TABS: ORAL_TABLET | ORAL | Status: DC

## 2015-10-19 MED FILL — BUPROPION HCL XL 300 MG TAB: 300 | 30 days supply | Qty: 30 | Fill #5

## 2015-10-19 MED FILL — LOVASTATIN 10 MG TABLET: 10 | 30 days supply | Qty: 30 | Fill #5

## 2015-10-19 MED FILL — ?ESTRADIOL 1MG TABLET: 1 | 30 days supply | Qty: 30 | Fill #4

## 2015-10-21 ENCOUNTER — Encounter: Payer: Self-pay | Admitting: Family Medicine

## 2015-10-21 ENCOUNTER — Ambulatory Visit: Payer: Self-pay | Attending: Family Medicine | Admitting: Family Medicine

## 2015-10-21 VITALS — BP 115/77 | HR 60 | Temp 98.3°F | Resp 16 | Ht 64.0 in | Wt 190.0 lb

## 2015-10-21 DIAGNOSIS — E039 Hypothyroidism, unspecified: Secondary | ICD-10-CM

## 2015-10-21 DIAGNOSIS — Z90722 Acquired absence of ovaries, bilateral: Secondary | ICD-10-CM

## 2015-10-21 DIAGNOSIS — L301 Dyshidrosis [pompholyx]: Secondary | ICD-10-CM | POA: Insufficient documentation

## 2015-10-21 DIAGNOSIS — Z90710 Acquired absence of both cervix and uterus: Secondary | ICD-10-CM

## 2015-10-21 DIAGNOSIS — N958 Other specified menopausal and perimenopausal disorders: Secondary | ICD-10-CM

## 2015-10-21 DIAGNOSIS — K625 Hemorrhage of anus and rectum: Secondary | ICD-10-CM

## 2015-10-21 DIAGNOSIS — E8940 Asymptomatic postprocedural ovarian failure: Secondary | ICD-10-CM

## 2015-10-21 DIAGNOSIS — K589 Irritable bowel syndrome without diarrhea: Secondary | ICD-10-CM

## 2015-10-21 DIAGNOSIS — Z114 Encounter for screening for human immunodeficiency virus [HIV]: Secondary | ICD-10-CM

## 2015-10-21 DIAGNOSIS — E785 Hyperlipidemia, unspecified: Secondary | ICD-10-CM

## 2015-10-21 LAB — CBC
HCT: 43 % (ref 35.0–45.0)
Hemoglobin: 14.8 g/dL (ref 11.7–15.5)
MCH: 33.4 pg — ABNORMAL HIGH (ref 27.0–33.0)
MCHC: 34.4 g/dL (ref 32.0–36.0)
MCV: 97.1 fL (ref 80.0–100.0)
MPV: 10.4 fL (ref 7.5–12.5)
Platelets: 200 10*3/uL (ref 140–400)
RBC: 4.43 MIL/uL (ref 3.80–5.10)
RDW: 12.3 % (ref 11.0–15.0)
WBC: 5.5 10*3/uL (ref 3.8–10.8)

## 2015-10-21 LAB — HEMOCCULT GUIAC POC 1CARD (OFFICE): Fecal Occult Blood, POC: NEGATIVE

## 2015-10-21 LAB — TSH: TSH: 2.04 mIU/L

## 2015-10-21 LAB — HIV ANTIBODY (ROUTINE TESTING W REFLEX): HIV 1&2 Ab, 4th Generation: NONREACTIVE

## 2015-10-21 MED ORDER — FLUOCINONIDE 0.05 % EX OINT: 1.0000 "application " | TOPICAL_OINTMENT | Freq: Two times a day (BID) | CUTANEOUS | Status: DC

## 2015-10-21 MED ORDER — DICYCLOMINE HCL 10 MG PO CAPS: 10.0000 mg | ORAL_CAPSULE | Freq: Three times a day (TID) | ORAL | Status: DC

## 2015-10-21 NOTE — Progress Notes (Signed)
C/C Blood in stool x 1 month  Pain scale #0 Tobacco user 1 ppday  No suicidal thought in the past two weeks

## 2015-10-21 NOTE — Progress Notes (Signed)
Subjective:  Patient ID: Kristen Moore, female    DOB: 07-28-1970  Age: 45 y.o. MRN: [id number]  CC: Rectal Bleeding   HPI Kristen Moore has bipolar disorder and IBS, she presents for   1. Rectal bleeding: x month, painless bleeding. Dark red. Last saw the bleeding about 5-6 days ago. No hx of hemorrhoids or fissures. Prior to one month ago stools changes with dark stools at time and light stools at time.. Also history of chronic abdominal pain. Has been evaluated by GI, 10 year ago, EGD and colonoscopy. She was diagnosed with IBS. No fever, chills, weight loss, nausea or emesis. No alcohol or THC. Tend to be constipated. She is not able to tolerate bentyl due to dizziness. She believes that a lot of her stomach upset is associated to lovastatin.  2. Hot flashes: worsening sweats. Occur multiple times a day. Taking estradiol 1 mg daily since total hysterectomy. Takes synthroid for hypothyroidism compliant with dose. No weight loss or chills. She is a smoker.   3. Rash: on hands and feet. Pruritic. Works at [employer]. Hands get wet often. No swelling in hands or feet. Rash is not swelling.   Social History  Substance Use Topics  . Smoking status: Current Every Day Smoker -- 1.00 packs/day for 9 years    Types: Cigarettes  . Smokeless tobacco: Never Used  . Alcohol Use: No   Past Surgical History  Procedure Laterality Date  . Cholecystectomy    . Tubal ligation    . Colon surgery    . Dilation and curettage of uterus    . Abdominal hysterectomy Bilateral 12/25/2012    Procedure: HYSTERECTOMY ABDOMINAL;  Surgeon: Emily Filbert, MD;  Location: [facility];  Service: Gynecology;  Laterality: Bilateral;  TAH and bilateral salpingectomy  . Bilateral salpingectomy Bilateral 12/25/2012    Procedure: BILATERAL SALPINGECTOMY;  Surgeon: Emily Filbert, MD;  Location: [facility];  Service: Gynecology;  Laterality: Bilateral;  . Oophorectomy Bilateral 12/25/2012    Procedure: OOPHORECTOMY;   Surgeon: Emily Filbert, MD;  Location: [facility];  Service: Gynecology;  Laterality: Bilateral;  . Cervical biopsy  w/ loop electrode excision       Outpatient Prescriptions Prior to Visit  Medication Sig Dispense Refill  . buPROPion (WELLBUTRIN XL) 150 MG 24 hr tablet Take 150 mg by mouth daily.    Marland Kitchen dicyclomine (BENTYL) 20 MG tablet Take 1 tablet (20 mg total) by mouth 4 (four) times daily -  before meals and at bedtime. 90 tablet 2  . estradiol (ESTRACE) 1 MG tablet Take 1 tablet (1 mg total) by mouth daily. 30 tablet 11  . levothyroxine (SYNTHROID, LEVOTHROID) 150 MCG tablet TAKE ONE TABLET BY MOUTH ONCE DAILY BEFORE BREAKFAST 90 tablet 3  . lithium carbonate 300 MG capsule Take 300-600 mg by mouth 2 (two) times daily with a meal. Takes 300 mg in the morning at 0800, 300 mg at 1600, and 600 mg at bedtime    . lovastatin (MEVACOR) 10 MG tablet Take 1 tablet (10 mg total) by mouth at bedtime. For cholesterol 30 tablet 4  . traZODone (DESYREL) 50 MG tablet Take 50 mg by mouth at bedtime.    . cyclobenzaprine (FLEXERIL) 5 MG tablet Take 1 tablet (5 mg total) by mouth 2 (two) times daily as needed for muscle spasms. (Patient not taking: Reported on 10/21/2015) 30 tablet 0  . nitrofurantoin, macrocrystal-monohydrate, (MACROBID) 100 MG capsule Take 1 capsule (100 mg total) by mouth  2 (two) times daily. (Patient not taking: Reported on 10/21/2015) 14 capsule 0  . polyethylene glycol powder (GLYCOLAX/MIRALAX) powder Take 17 g by mouth daily. (Patient not taking: Reported on 10/21/2015) 850 g 1  . levothyroxine (SYNTHROID, LEVOTHROID) 150 MCG tablet TAKE ONE TABLET BY MOUTH DAILY BEFORE BREAKFAST 30 tablet 0  . levothyroxine (SYNTHROID, LEVOTHROID) 150 MCG tablet TAKE ONE TABLET BY MOUTH ONCE DAILY BEFORE BREAKFAST 90 tablet 0   No facility-administered medications prior to visit.    ROS Review of Systems  Constitutional: Negative for fever, chills and unexpected weight change.  Eyes: Negative for visual  disturbance.  Respiratory: Negative for shortness of breath.   Cardiovascular: Negative for chest pain.  Gastrointestinal: Positive for abdominal pain, constipation, blood in stool and anal bleeding. Negative for nausea, vomiting, diarrhea, abdominal distention and rectal pain.  Endocrine: Positive for heat intolerance.  Musculoskeletal: Negative for back pain and arthralgias.  Skin: Positive for rash.  Allergic/Immunologic: Negative for immunocompromised state.  Hematological: Negative for adenopathy. Does not bruise/bleed easily.  Psychiatric/Behavioral: Negative for suicidal ideas and dysphoric mood.    Objective:  BP 115/77 mmHg  Pulse 60  Temp(Src) 98.3 F (36.8 C) (Oral)  Resp 16  Ht 5\' 4"  (1.626 m)  Wt 190 lb (86.183 kg)  BMI 32.60 kg/m2  SpO2 97%  LMP 11/29/2012  BP/Weight 10/21/2015 05/18/2015 [id number]  Systolic BP [id number]  Diastolic BP 77 87 67  Wt. (Lbs) 190 190.2 190  BMI 32.6 32.63 32.6    Physical Exam  Constitutional: She is oriented to person, place, and time. She appears well-developed and well-nourished. No distress.  HENT:  Head: Normocephalic and atraumatic.  Cardiovascular: Normal rate, regular rhythm, normal heart sounds and intact distal pulses.   Pulmonary/Chest: Effort normal and breath sounds normal.  Abdominal: Soft. Bowel sounds are normal. She exhibits no distension. There is no hepatosplenomegaly, splenomegaly or hepatomegaly. There is tenderness in the periumbilical area. There is no rebound, no guarding and no CVA tenderness. No hernia. Hernia confirmed negative in the ventral area, confirmed negative in the right inguinal area and confirmed negative in the left inguinal area.  Genitourinary:     Musculoskeletal: She exhibits no edema.  Neurological: She is alert and oriented to person, place, and time.  Skin: Skin is warm and dry. No rash noted.     Psychiatric: She has a normal mood and affect.    Lipid Panel     Component  Value Date/Time   CHOL 202* 03/15/2015 1742   TRIG 144 03/15/2015 1742   HDL 46 03/15/2015 1742   CHOLHDL 4.4 03/15/2015 1742   VLDL 29 03/15/2015 1742   LDLCALC 127 03/15/2015 1742    10 year CVD risk is 3.2%, not in a statin benefit group.  Assessment & Plan:   There are no diagnoses linked to this encounter. Kristen Moore was seen today for rectal bleeding.  Diagnoses and all orders for this visit:  Bright red blood per rectum -     CBC  IBS (irritable bowel syndrome) -     Hemoccult - 1 Card (office) -     dicyclomine (BENTYL) 10 MG capsule; Take 1 capsule (10 mg total) by mouth 4 (four) times daily -  before meals and at bedtime.  S/P bilateral oophorectomy  Hypothyroidism, unspecified hypothyroidism type -     TSH  Screening for HIV (human immunodeficiency virus) -     HIV antibody (with reflex)  Dyshidrotic hand dermatitis -  fluocinonide ointment (LIDEX) 0.05 %; Apply 1 application topically 2 (two) times daily.  Dyshidrotic foot dermatitis -     fluocinonide ointment (LIDEX) 0.05 %; Apply 1 application topically 2 (two) times daily.  Post hysterectomy menopause -     estradiol (ESTRACE) 2 MG tablet; Take 1 tablet (2 mg total) by mouth daily.   No orders of the defined types were placed in this encounter.    Follow-up: No Follow-up on file.   Boykin Nearing MD

## 2015-10-21 NOTE — Patient Instructions (Addendum)
Kristen Moore was seen today for rectal bleeding.  Diagnoses and all orders for this visit:  Bright red blood per rectum -     CBC  IBS (irritable bowel syndrome) -     Hemoccult - 1 Card (office) -     dicyclomine (BENTYL) 10 MG capsule; Take 1 capsule (10 mg total) by mouth 4 (four) times daily -  before meals and at bedtime.  S/P bilateral oophorectomy  Hypothyroidism, unspecified hypothyroidism type -     TSH  Screening for HIV (human immunodeficiency virus) -     HIV antibody (with reflex)  Dyshidrotic hand dermatitis -     fluocinonide ointment (LIDEX) 0.05 %; Apply 1 application topically 2 (two) times daily.  Dyshidrotic foot dermatitis -     fluocinonide ointment (LIDEX) 0.05 %; Apply 1 application topically 2 (two) times daily.   There is no blood in stool today and rectal exam is normal  I have decreased the bentyl from 20 mg to 10 mg so you can better tolerate it  Stop lovastatin as your cholesterol is slightly elevated but not high enough to benefit from statin therapy. Do work on quitting smoking to lower your risk of heart disease and stroke.  Smoking cessation support: smoking cessation hotline: 1-800-QUIT-NOW.  Smoking cessation classes are available through [facility]. Call [phone] or visit our website at [email or web address]    Checking CBC Checking TSH as you may be having hot flashes due to estrogen dose being too low at 1 mg daily and needing to be adjusted to 2 mg daily  or due to synthroid dose needing to be adjusted, the TSH will let us know which.   Lidex for eczema on hand and feet.  Check HIV just for one time screening recommended for every patient by the [facility] system   F/u with me in 4 weeks for IBS   Dr. Adrian Blackwater

## 2015-10-22 DIAGNOSIS — Z90710 Acquired absence of both cervix and uterus: Secondary | ICD-10-CM | POA: Insufficient documentation

## 2015-10-22 DIAGNOSIS — E8940 Asymptomatic postprocedural ovarian failure: Secondary | ICD-10-CM | POA: Insufficient documentation

## 2015-10-22 MED ORDER — ESTRADIOL 2 MG PO TABS: 2.0000 mg | ORAL_TABLET | Freq: Every day | ORAL | Status: DC

## 2015-10-22 NOTE — Assessment & Plan Note (Signed)
Post hysterectomy menopause with hot flashes  Increase estradiol to 2 mg daily Repeat screening mammogram in 04/2016

## 2015-10-23 ENCOUNTER — Encounter: Payer: Self-pay | Admitting: Family Medicine

## 2015-10-23 NOTE — Assessment & Plan Note (Signed)
Not in a statin benefit group Stop statin Smoking cessation advised

## 2015-10-23 NOTE — Assessment & Plan Note (Signed)
Normal rectal exam today Reassurance CBC Watchful waiting

## 2015-10-23 NOTE — Assessment & Plan Note (Signed)
Intolerant of 20 mg bentyl  Reduce bentyl dose to 10 mg

## 2015-10-27 ENCOUNTER — Telehealth: Payer: Self-pay | Admitting: Family Medicine

## 2015-10-27 NOTE — Telephone Encounter (Signed)
Pt. Called requesting her blood work results. Please f/u with pt.

## 2015-11-07 MED FILL — LEVOTHYROXINE 150 MCG TAB: 150 | 30 days supply | Qty: 30 | Fill #2

## 2015-11-16 MED FILL — BUPROPION HCL XL 300 MG TAB: 300 | 30 days supply | Qty: 30 | Fill #6

## 2015-11-16 MED FILL — ?ESTRADIOL 1MG TABLET: 1 | 30 days supply | Qty: 30 | Fill #5

## 2015-11-28 ENCOUNTER — Encounter: Payer: Self-pay | Admitting: Family Medicine

## 2015-11-28 ENCOUNTER — Ambulatory Visit: Payer: Self-pay | Attending: Family Medicine | Admitting: Family Medicine

## 2015-11-28 ENCOUNTER — Ambulatory Visit: Payer: No Typology Code available for payment source | Admitting: Family Medicine

## 2015-11-28 VITALS — BP 125/85 | HR 66 | Temp 98.5°F | Resp 14 | Ht 64.0 in | Wt 188.2 lb

## 2015-11-28 DIAGNOSIS — E039 Hypothyroidism, unspecified: Secondary | ICD-10-CM

## 2015-11-28 LAB — TSH: TSH: 7.62 mIU/L — ABNORMAL HIGH

## 2015-11-28 NOTE — Progress Notes (Signed)
Pt here for thyroid check. Pt denies any pain today. Pt has eaten today. Pt has taken medication today and does not need any refills.

## 2015-11-28 NOTE — Assessment & Plan Note (Signed)
A: hypothyroidism, last recent TSH normal. Patient reports elevated TSH at [facility] P: Repeat TSH Patient will sign a release for medical records Release will be sent to [facility] Records Department

## 2015-11-28 NOTE — Patient Instructions (Addendum)
Lexia was seen today for thyroid problem.  Diagnoses and all orders for this visit:  Hypothyroidism, unspecified hypothyroidism type -     TSH   Smoking cessation support: smoking cessation hotline: 1-800-QUIT-NOW.  Smoking cessation classes are available through [facility]. Call [phone] or visit our website at [email or web address]   You will be called with TSH results and adjusted synthroid dose if needed  F/u in 3 months

## 2015-11-28 NOTE — Progress Notes (Signed)
Subjective:  Patient ID: Kristen Moore, female    DOB: 04-12-1971  Age: 45 y.o. MRN: [id number]  CC: Thyroid Problem   HPI Kristen Moore presents for    1. Hypothyroidism: patient reports that her TSH level was 5 on 6.20.17. She reports eating poorly and feeling heavy. She is taking synthroid daily.   2. Estrogen deficiency: she has not yet started the 2 mg estradiol. She still has hot flashes that occur frequently at night. She still smokes but is down to 1/2 PPD.   Social History  Substance Use Topics  . Smoking status: Current Every Day Smoker -- 0.50 packs/day for 9 years    Types: Cigarettes  . Smokeless tobacco: Never Used  . Alcohol Use: No    Outpatient Prescriptions Prior to Visit  Medication Sig Dispense Refill  . buPROPion (WELLBUTRIN XL) 150 MG 24 hr tablet Take 150 mg by mouth daily.    Marland Kitchen dicyclomine (BENTYL) 10 MG capsule Take 1 capsule (10 mg total) by mouth 4 (four) times daily -  before meals and at bedtime. 120 capsule 3  . estradiol (ESTRACE) 2 MG tablet Take 1 tablet (2 mg total) by mouth daily. 30 tablet 5  . fluocinonide ointment (LIDEX) [id number] % Apply 1 application topically 2 (two) times daily. 60 g 0  . levothyroxine (SYNTHROID, LEVOTHROID) 150 MCG tablet TAKE ONE TABLET BY MOUTH ONCE DAILY BEFORE BREAKFAST 90 tablet 3  . lithium carbonate 300 MG capsule Take 300-600 mg by mouth 2 (two) times daily with a meal. Takes 300 mg in the morning at 0800, 300 mg at 1600, and 600 mg at bedtime    . Multiple Vitamin (MULTIVITAMIN) capsule Take 1 capsule by mouth daily.    . polyethylene glycol powder (GLYCOLAX/MIRALAX) powder Take 17 g by mouth daily. (Patient not taking: Reported on 10/21/2015) 850 g 1  . psyllium (REGULOID) 0.52 g capsule Take 0.52 g by mouth daily.    . traZODone (DESYREL) 50 MG tablet Take 50 mg by mouth at bedtime.     No facility-administered medications prior to visit.    ROS Review of Systems  Constitutional: Negative for  fever, chills and unexpected weight change.  Eyes: Negative for visual disturbance.  Respiratory: Negative for shortness of breath.   Cardiovascular: Negative for chest pain.  Gastrointestinal: Negative for nausea, vomiting, abdominal pain, diarrhea, constipation, blood in stool, abdominal distention, anal bleeding and rectal pain.  Endocrine: Positive for heat intolerance.  Musculoskeletal: Negative for back pain and arthralgias.  Skin: Positive for rash (on hands ).  Allergic/Immunologic: Negative for immunocompromised state.  Hematological: Negative for adenopathy. Does not bruise/bleed easily.  Psychiatric/Behavioral: Negative for suicidal ideas and dysphoric mood.    Objective:  BP 125/85 mmHg  Pulse 66  Temp(Src) 98.5 F (36.9 C) (Oral)  Resp 14  Ht 5\' 4"  (1.626 m)  Wt 188 lb 3.2 oz (85.367 kg)  BMI 32.29 kg/m2  SpO2 100%  LMP 11/29/2012  BP/Weight 11/28/2015 10/21/2015 [id number]  Systolic BP 0000000 [id number]  Diastolic BP 85 77 87  Wt. (Lbs) 188.2 190 190.2  BMI 32.29 32.6 32.63    Physical Exam  Constitutional: She is oriented to person, place, and time. She appears well-developed and well-nourished. No distress.  HENT:  Head: Normocephalic and atraumatic.  Neck: No thyromegaly present.  Cardiovascular: Normal rate, regular rhythm, normal heart sounds and intact distal pulses.   Pulmonary/Chest: Effort normal and breath sounds normal.  Musculoskeletal: She exhibits no edema.  Lymphadenopathy:    She has no cervical adenopathy.  Neurological: She is alert and oriented to person, place, and time.  Skin: Skin is warm and dry. No rash noted.  Psychiatric: She has a normal mood and affect.     Lab Results  Component Value Date   TSH 2.04 10/21/2015    Assessment & Plan:   There are no diagnoses linked to this encounter.  No orders of the defined types were placed in this encounter.    Follow-up: No Follow-up on file.   Boykin Nearing MD

## 2015-11-29 MED ORDER — LEVOTHYROXINE SODIUM 175 MCG PO TABS: ORAL_TABLET | ORAL | Status: DC

## 2015-11-29 NOTE — Addendum Note (Signed)
Addended by: Boykin Nearing on: 11/29/2015 08:22 AM   Modules accepted: Orders

## 2015-12-07 ENCOUNTER — Other Ambulatory Visit: Payer: Self-pay | Admitting: Internal Medicine

## 2015-12-07 MED FILL — ?ESTRADIOL 1MG TABLET: 1 | 30 days supply | Qty: 30 | Fill #6

## 2015-12-07 MED FILL — ?LEVOTHYROXINE 175 MCG TABL: 175 | 30 days supply | Qty: 30 | Fill #0

## 2015-12-19 MED FILL — BUPROPION HCL XL 300 MG TAB: 300 | 30 days supply | Qty: 30 | Fill #7

## 2015-12-21 ENCOUNTER — Ambulatory Visit: Payer: No Typology Code available for payment source | Attending: Family Medicine

## 2015-12-27 MED FILL — ?ESTRADIOL 1MG TABLET: 1 | 30 days supply | Qty: 60 | Fill #0

## 2016-01-09 MED FILL — ?LEVOTHYROXINE 175 MCG TABL: 175 | 30 days supply | Qty: 30 | Fill #1

## 2016-01-16 DIAGNOSIS — M7541 Impingement syndrome of right shoulder: Secondary | ICD-10-CM | POA: Insufficient documentation

## 2016-01-17 MED FILL — BUPROPION HCL XL 300 MG TAB: 300 | 30 days supply | Qty: 30 | Fill #8

## 2016-01-25 MED FILL — ?ESTRADIOL 1MG TABLET: 1 | 30 days supply | Qty: 60 | Fill #1

## 2016-02-07 MED FILL — ?LEVOTHYROXINE 175 MCG TABL: 175 | 30 days supply | Qty: 30 | Fill #2

## 2016-02-29 MED FILL — ?LEVOTHYROXINE 175 MCG TABL: 175 | 30 days supply | Qty: 30 | Fill #3

## 2016-02-29 MED FILL — ?ESTRADIOL 1MG TABLET: 1 | 30 days supply | Qty: 60 | Fill #2

## 2016-03-27 MED FILL — ?ESTRADIOL 1MG TABLET: 1 | 30 days supply | Qty: 60 | Fill #3

## 2016-03-27 MED FILL — ?LEVOTHYROXINE 175 MCG TABL: 175 | 30 days supply | Qty: 30 | Fill #4

## 2016-04-04 ENCOUNTER — Other Ambulatory Visit: Payer: Self-pay | Admitting: Family Medicine

## 2016-04-17 ENCOUNTER — Other Ambulatory Visit (HOSPITAL_COMMUNITY): Payer: Self-pay | Admitting: *Deleted

## 2016-04-17 DIAGNOSIS — N644 Mastodynia: Secondary | ICD-10-CM

## 2016-04-24 MED FILL — ?ESTRADIOL 1MG TABLET: 1 | 30 days supply | Qty: 60 | Fill #4

## 2016-04-24 MED FILL — ?LEVOTHYROXINE 175 MCG TABL: 175 | 30 days supply | Qty: 30 | Fill #5

## 2016-05-10 ENCOUNTER — Ambulatory Visit (HOSPITAL_COMMUNITY)
Admission: RE | Admit: 2016-05-10 | Discharge: 2016-05-10 | Disposition: A | Discharge status: Home / Self Care | Payer: Self-pay | Source: Ambulatory Visit | Attending: Obstetrics and Gynecology | Admitting: Obstetrics and Gynecology

## 2016-05-10 ENCOUNTER — Encounter (HOSPITAL_COMMUNITY): Payer: Self-pay

## 2016-05-10 ENCOUNTER — Ambulatory Visit
Admission: RE | Admit: 2016-05-10 | Discharge: 2016-05-10 | Disposition: A | Discharge status: Home / Self Care | Payer: No Typology Code available for payment source | Source: Ambulatory Visit | Attending: Obstetrics and Gynecology | Admitting: Obstetrics and Gynecology

## 2016-05-10 VITALS — BP 118/74 | Temp 98.1°F | Ht 64.0 in | Wt 201.6 lb

## 2016-05-10 DIAGNOSIS — N644 Mastodynia: Secondary | ICD-10-CM

## 2016-05-10 DIAGNOSIS — Z1239 Encounter for other screening for malignant neoplasm of breast: Secondary | ICD-10-CM

## 2016-05-10 NOTE — Progress Notes (Signed)
Complaints of right upper breast pain that comes and goes x 2 months. Patient states the pain comes and goes. Patient rates the pain at a 7 out of 10.  Pap Smear: Pap smear not completed today. Last Pap smear was 06/06/2011 at [facility] and normal. Per patient has a history of an abnormal Pap smear in 1992 that required a LEEP for follow-up. Per patient all Pap smears have been normal since LEEP. Patient has a history of a hysterectomy 12/25/2012 due to fibroids. Patient no longer needs Pap smears due to her history of a hysterectomy for benign reasons per BCCCP and ACOG guidelines. Last Pap smear result is in EPIC.  Physical exam: Breasts Breasts symmetrical. No skin abnormalities bilateral breasts. No nipple retraction bilateral breasts. No nipple discharge bilateral breasts. No lymphadenopathy. No lumps palpated bilateral breasts. Complaints of bilateral outer breast pain on exam. Referred patient to the [facility] for diagnostic mammogram. Appointment scheduled for Thursday, May 10, 2016 at 1100.        Pelvic/Bimanual No Pap smear completed today since patient has a history of a hysterectomy for benign reasons. Pap smear not indicated per BCCCP guidelines.   Smoking History: Patient is a current smoker. Discussed smoking cessation with patient. Referred patient to the [facility] Quitline and gave resources to free smoking cessation classes at [facility].  Patient Navigation: Patient education provided. Access to services provided for patient through [facility] program.

## 2016-05-10 NOTE — Patient Instructions (Addendum)
Explained breast awareness to [employer]. Patient did not need a Pap smear today due to her history of a hysterectomy for benign reasons. Let patient know that she does not need any further Pap smears due to her history of a hysterectomy for benign reasons. Referred patient to the [facility] for diagnostic mammogram. Appointment scheduled for Thursday, May 10, 2016 at 1100. Discussed smoking cessation with patient. Referred patient to the [facility] Quitline and gave resources to free smoking cessation classes at [facility]. Kristen Moore verbalized understanding.  Mishelle Hassan, Arvil Chaco, RN 10:57 AM

## 2016-05-11 ENCOUNTER — Encounter (HOSPITAL_COMMUNITY): Payer: Self-pay | Admitting: *Deleted

## 2016-05-22 ENCOUNTER — Telehealth: Payer: Self-pay | Admitting: Family Medicine

## 2016-05-22 MED ORDER — LEVOTHYROXINE SODIUM 175 MCG PO TABS: ORAL_TABLET | ORAL | 0 refills | Status: DC

## 2016-05-22 MED FILL — LEVOTHYROXINE 175 MCG TAB: 175 | 30 days supply | Qty: 30 | Fill #0

## 2016-05-22 MED FILL — ?ESTRADIOL 1MG TABLET: 1 | 30 days supply | Qty: 60 | Fill #5

## 2016-05-22 NOTE — Telephone Encounter (Signed)
Patient called the office to request medication refill levothyroxine (SYNTHROID, LEVOTHROID) 175 MCG tablet. Please send it to our pharmacy [facility]).  Thank you.

## 2016-05-22 NOTE — Telephone Encounter (Signed)
Levothyroxine refilled - patient must have office visit for refills.

## 2016-06-05 ENCOUNTER — Ambulatory Visit: Payer: Self-pay | Attending: Family Medicine | Admitting: Family Medicine

## 2016-06-05 ENCOUNTER — Encounter: Payer: Self-pay | Admitting: Family Medicine

## 2016-06-05 VITALS — BP 132/84 | HR 64 | Temp 97.8°F | Ht 64.0 in | Wt 200.8 lb

## 2016-06-05 DIAGNOSIS — Z90710 Acquired absence of both cervix and uterus: Secondary | ICD-10-CM | POA: Insufficient documentation

## 2016-06-05 DIAGNOSIS — H543 Unqualified visual loss, both eyes: Secondary | ICD-10-CM

## 2016-06-05 DIAGNOSIS — E783 Hyperchylomicronemia: Secondary | ICD-10-CM | POA: Insufficient documentation

## 2016-06-05 DIAGNOSIS — E8940 Asymptomatic postprocedural ovarian failure: Secondary | ICD-10-CM

## 2016-06-05 DIAGNOSIS — E039 Hypothyroidism, unspecified: Secondary | ICD-10-CM | POA: Insufficient documentation

## 2016-06-05 DIAGNOSIS — F17210 Nicotine dependence, cigarettes, uncomplicated: Secondary | ICD-10-CM | POA: Insufficient documentation

## 2016-06-05 MED ORDER — ESTRADIOL 2 MG PO TABS: 2.0000 mg | ORAL_TABLET | Freq: Every day | ORAL | 5 refills | Status: DC

## 2016-06-05 NOTE — Progress Notes (Signed)
Subjective:  Patient ID: Kristen Moore, female    DOB: 1971/01/21  Age: 46 y.o. MRN: [id number]  CC: Hypothyroidism   HPI AYUMI DIP presents for    1. Hypothyroidism:  She is feeling fatigue throughout the day. She naps often. Weight has gone up. No swelling. She is compliant with synthroid 175 mcg daily.   2. Estrogen deficiency: she is taking 4 mg estradiol nightly. She has mammogram done last month.  She smokes and has increased to 1 PPD.   Social History  Substance Use Topics  . Smoking status: Current Every Day Smoker    Packs/day: 0.50    Years: 9.00    Types: Cigarettes  . Smokeless tobacco: Never Used  . Alcohol use No    Outpatient Medications Prior to Visit  Medication Sig Dispense Refill  . buPROPion (WELLBUTRIN XL) 150 MG 24 hr tablet Take 150 mg by mouth daily.    Marland Kitchen estradiol (ESTRACE) 2 MG tablet Take 1 tablet (2 mg total) by mouth daily. 30 tablet 5  . hydrOXYzine (ATARAX/VISTARIL) 10 MG tablet Take 10 mg by mouth 3 (three) times daily as needed.    Marland Kitchen levothyroxine (SYNTHROID, LEVOTHROID) 175 MCG tablet TAKE ONE TABLET BY MOUTH ONCE DAILY BEFORE BREAKFAST 30 tablet 0  . lithium carbonate 300 MG capsule Take 300-600 mg by mouth 2 (two) times daily with a meal. Takes 300 mg in the morning at 0800, 300 mg at 1600, and 600 mg at bedtime    . Multiple Vitamin (MULTIVITAMIN) capsule Take 1 capsule by mouth daily.    . traZODone (DESYREL) 50 MG tablet Take 50 mg by mouth at bedtime.    . cyclobenzaprine (FLEXERIL) 5 MG tablet Take 5 mg by mouth 2 (two) times daily as needed for muscle spasms.    Marland Kitchen dicyclomine (BENTYL) 10 MG capsule Take 1 capsule (10 mg total) by mouth 4 (four) times daily -  before meals and at bedtime. (Patient not taking: Reported on 06/05/2016) 120 capsule 3  . fluocinonide ointment (LIDEX) [id number] % Apply 1 application topically 2 (two) times daily. (Patient not taking: Reported on 06/05/2016) 60 g 0  . polyethylene glycol powder  (GLYCOLAX/MIRALAX) powder Take 17 g by mouth daily. (Patient not taking: Reported on 06/05/2016) 850 g 1  . psyllium (REGULOID) 0.52 g capsule Take 0.52 g by mouth daily. Reported on 11/28/2015     No facility-administered medications prior to visit.     ROS Review of Systems  Constitutional: Negative for chills, fever and unexpected weight change.  Eyes: Positive for visual disturbance.  Respiratory: Negative for shortness of breath.   Cardiovascular: Negative for chest pain.  Gastrointestinal: Negative for abdominal distention, abdominal pain, anal bleeding, blood in stool, constipation, diarrhea, nausea, rectal pain and vomiting.  Endocrine: Positive for heat intolerance.  Musculoskeletal: Negative for arthralgias and back pain.  Allergic/Immunologic: Negative for immunocompromised state.  Neurological: Positive for headaches.  Hematological: Negative for adenopathy. Does not bruise/bleed easily.  Psychiatric/Behavioral: Negative for dysphoric mood and suicidal ideas.    Objective:  BP 132/84 (BP Location: Left Arm, Patient Position: Sitting, Cuff Size: Small)   Pulse 64   Temp 97.8 F (36.6 C) (Oral)   Ht 5\' 4"  (1.626 m)   Wt 200 lb 12.8 oz (91.1 kg)   LMP 11/29/2012   SpO2 99%   BMI 34.47 kg/m   BP/Weight 06/05/2016 05/10/2016 [id number]  Systolic BP [id number]  Diastolic BP 84 74 85  Wt. (Lbs) 200.8  201.6 188.2  BMI 34.47 34.6 32.29    Physical Exam  Constitutional: She is oriented to person, place, and time. She appears well-developed and well-nourished. No distress.  HENT:  Head: Normocephalic and atraumatic.  Neck: No thyromegaly present.  Cardiovascular: Normal rate, regular rhythm, normal heart sounds and intact distal pulses.   Pulmonary/Chest: Effort normal and breath sounds normal.  Musculoskeletal: She exhibits no edema.  Lymphadenopathy:    She has no cervical adenopathy.  Neurological: She is alert and oriented to person, place, and time.  Skin: Skin is  warm and dry. No rash noted.  Psychiatric: She has a normal mood and affect.    Lab Results  Component Value Date   TSH 7.62 (H) 11/28/2015    Assessment & Plan:  Sebella was seen today for hypothyroidism.  Diagnoses and all orders for this visit:  Hypothyroidism, unspecified type -     TSH  Post hysterectomy menopause -     estradiol (ESTRACE) 2 MG tablet; Take 1 tablet (2 mg total) by mouth daily.  Hyperchylomicronemia -     Lipid Panel  Decreased vision in both eyes -     Ambulatory referral to Optometry   There are no diagnoses linked to this encounter.  No orders of the defined types were placed in this encounter.   Follow-up: Return in about 4 weeks (around 07/03/2016) for wellness physical .   Boykin Nearing MD

## 2016-06-05 NOTE — Assessment & Plan Note (Signed)
Suspect ongoing hypothyroidism TSH done today Adjust synthroid as needed

## 2016-06-05 NOTE — Assessment & Plan Note (Signed)
Very poor vision with HA No HTN Plan: Optometry referral

## 2016-06-05 NOTE — Patient Instructions (Addendum)
Kristen Moore was seen today for hypothyroidism.  Diagnoses and all orders for this visit:  Hypothyroidism, unspecified type -     TSH  Post hysterectomy menopause -     estradiol (ESTRACE) 2 MG tablet; Take 1 tablet (2 mg total) by mouth daily.  Hyperchylomicronemia -     Lipid Panel   Low out cost optometrist (about $65.00 for office visit)  1. Dr. Thurston Hole Phone # [phone] [address]  [place], [place] [address]   2. [facility]  Phone # [phone] [address], [place] [address]   Smoking cessation support: smoking cessation hotline: 1-800-QUIT-NOW.  Smoking cessation classes are available through [facility]. Call [phone] or visit our website at [email or web address]  21 mg patch for 6 weeks 14 mg patch for 2 weeks 7 mg patch for 2 weeks   F/u in 4-6 weeks for wellness physical  Dr. Adrian Blackwater

## 2016-06-05 NOTE — Assessment & Plan Note (Signed)
Continue estradiol 4 mg daily

## 2016-06-06 LAB — LIPID PANEL
Cholesterol: 223 mg/dL — ABNORMAL HIGH (ref <200)
HDL: 51 mg/dL (ref >50)
LDL Cholesterol: 125 mg/dL — ABNORMAL HIGH (ref <100)
Total CHOL/HDL Ratio: 4.4 Ratio (ref <5.0)
Triglycerides: 237 mg/dL — ABNORMAL HIGH (ref <150)
VLDL: 47 mg/dL — ABNORMAL HIGH (ref <30)

## 2016-06-06 LAB — TSH: TSH: 0.41 mIU/L

## 2016-06-07 ENCOUNTER — Ambulatory Visit: Payer: No Typology Code available for payment source | Admitting: Family Medicine

## 2016-06-14 ENCOUNTER — Telehealth: Payer: Self-pay | Admitting: Family Medicine

## 2016-06-14 NOTE — Telephone Encounter (Signed)
Pt calling to receive lab results. Pt was informed that results are not in yet.  Pt is requesting the nurse call her home phone, [phone] if the results are in before January 17th.

## 2016-06-15 NOTE — Telephone Encounter (Signed)
Results are in TSH is in normal range, continue current synthroid dose No evidence of hypothyroidism   Elevated cholesterol total and triglycerides  Continue exercise Reduce sugar intake Add fish oil No need for statin at this time

## 2016-06-18 NOTE — Telephone Encounter (Signed)
Pt was called and a VM was left informing pt to return phone call for lab results. 

## 2016-06-19 NOTE — Telephone Encounter (Signed)
Pt has received her lab results

## 2016-06-19 NOTE — Telephone Encounter (Signed)
Pt returning call to receive lab results. Requesting a call at [phone]

## 2016-06-19 NOTE — Telephone Encounter (Signed)
Patient called for lab results.

## 2016-06-25 ENCOUNTER — Other Ambulatory Visit: Payer: Self-pay | Admitting: Family Medicine

## 2016-06-25 MED FILL — LEVOTHYROXINE 175 MCG TAB: 175 | 30 days supply | Qty: 30 | Fill #0

## 2016-06-25 MED FILL — ?ESTRADIOL 1MG TABLET: 1 | 30 days supply | Qty: 60 | Fill #0

## 2016-07-24 MED FILL — LEVOTHYROXINE 175 MCG TAB: 175 | 30 days supply | Qty: 30 | Fill #1

## 2016-07-24 MED FILL — ?ESTRADIOL 1MG TABLET: 1 | 30 days supply | Qty: 60 | Fill #1

## 2016-08-27 MED FILL — ?ESTRADIOL 1MG TABLET: 1 | 30 days supply | Qty: 60 | Fill #2

## 2016-08-27 MED FILL — LEVOTHYROXINE 175 MCG TAB: 175 | 30 days supply | Qty: 30 | Fill #2

## 2016-09-03 ENCOUNTER — Ambulatory Visit: Payer: Self-pay | Attending: Family Medicine | Admitting: Family Medicine

## 2016-09-03 ENCOUNTER — Encounter: Payer: Self-pay | Admitting: Family Medicine

## 2016-09-03 VITALS — BP 125/85 | HR 65 | Temp 98.2°F | Ht 64.0 in | Wt 203.0 lb

## 2016-09-03 DIAGNOSIS — F17210 Nicotine dependence, cigarettes, uncomplicated: Secondary | ICD-10-CM | POA: Insufficient documentation

## 2016-09-03 DIAGNOSIS — M7541 Impingement syndrome of right shoulder: Secondary | ICD-10-CM | POA: Insufficient documentation

## 2016-09-03 DIAGNOSIS — Z79899 Other long term (current) drug therapy: Secondary | ICD-10-CM | POA: Insufficient documentation

## 2016-09-03 DIAGNOSIS — N644 Mastodynia: Secondary | ICD-10-CM | POA: Insufficient documentation

## 2016-09-03 DIAGNOSIS — E039 Hypothyroidism, unspecified: Secondary | ICD-10-CM | POA: Insufficient documentation

## 2016-09-03 DIAGNOSIS — M7540 Impingement syndrome of unspecified shoulder: Secondary | ICD-10-CM | POA: Insufficient documentation

## 2016-09-03 NOTE — Assessment & Plan Note (Signed)
Hyperthyroid per report Rechecking TFTs today

## 2016-09-03 NOTE — Patient Instructions (Addendum)
Kristen Moore was seen today for hypothyroidism.  Diagnoses and all orders for this visit:  Hypothyroidism, unspecified type -     TSH -     T3, Free -     Cancel: T4, free; Future -     T4, free  Rotator cuff impingement syndrome of right shoulder -     Ambulatory referral to Orthopedic Surgery   Your Orthopedic was Dr. Veneta Penton I have placed a new referral   [place], [place] [address]  [phone] Phone   You will be called with lab results  f/u in 3 months for hypothyroidism   Dr. Adrian Blackwater

## 2016-09-03 NOTE — Progress Notes (Signed)
Subjective:  Patient ID: Kristen Moore, female    DOB: [birth year]  Age: 46 y.o. MRN: [id number]  CC: Hypothyroidism   HPI Kristen Moore has mental health disorder, hypothyroidism, smoker she presents for    1. Hypothyroidism: she is compliant with synthroid. She reports that she had labs done a [facility] and was informed that she is hyperthyroid. She reports weight gain. She has minimal exercise. She eats a high sugar diet.   2. L breast pain: lateral L breast pain and swelling. Swelling resolved. Pain persist. Pain in lateral breast and axilla. Had similar pain in R breast. No trauma. No redness. No skin rash. She had normal diagnostic mammogram on 05/10/2016.   3. R shoulder pain: she has hx of R rotator cuff impingement syndrome. She received PT and an injection by ortho. She is still having pain, with popping sensation in her R shoulder and limited range of motion.   Social History  Substance Use Topics  . Smoking status: Current Every Day Smoker    Packs/day: 0.50    Years: 9.00    Types: Cigarettes  . Smokeless tobacco: Never Used  . Alcohol use No    Outpatient Medications Prior to Visit  Medication Sig Dispense Refill  . buPROPion (WELLBUTRIN XL) 150 MG 24 hr tablet Take 150 mg by mouth daily.    . cyclobenzaprine (FLEXERIL) 5 MG tablet Take 5 mg by mouth 2 (two) times daily as needed for muscle spasms.    Marland Kitchen estradiol (ESTRACE) 2 MG tablet Take 1 tablet (2 mg total) by mouth daily. 60 tablet 5  . hydrOXYzine (ATARAX/VISTARIL) 10 MG tablet Take 10 mg by mouth 3 (three) times daily as needed.    Marland Kitchen levothyroxine (SYNTHROID, LEVOTHROID) 175 MCG tablet TAKE ONE TABLET BY MOUTH ONCE DAILY BEFORE BREAKFAST 30 tablet 3  . lithium carbonate 300 MG capsule Take 300-600 mg by mouth 2 (two) times daily with a meal. Takes 300 mg in the morning at 0800, 300 mg at 1600, and 600 mg at bedtime    . Multiple Vitamin (MULTIVITAMIN) capsule Take 1 capsule by mouth daily.    .  psyllium (REGULOID) 0.52 g capsule Take 0.52 g by mouth daily. Reported on 11/28/2015    . traZODone (DESYREL) 50 MG tablet Take 50 mg by mouth at bedtime.     No facility-administered medications prior to visit.     ROS Review of Systems  Constitutional: Negative for chills, fever and unexpected weight change.  Respiratory: Negative for shortness of breath.   Cardiovascular: Negative for chest pain.  Gastrointestinal: Negative for abdominal distention, abdominal pain, anal bleeding, blood in stool, constipation, diarrhea, nausea, rectal pain and vomiting.  Endocrine: Positive for heat intolerance.  Musculoskeletal: Positive for arthralgias (R shoulder ). Negative for back pain.  Allergic/Immunologic: Negative for immunocompromised state.  Neurological: Positive for headaches.  Hematological: Negative for adenopathy. Does not bruise/bleed easily.  Psychiatric/Behavioral: Negative for dysphoric mood and suicidal ideas.    Objective:  BP 125/85   Pulse 65   Temp 98.2 F (36.8 C) (Oral)   Ht 5\' 4"  (1.626 m)   Wt 203 lb (92.1 kg)   LMP 11/29/2012   SpO2 97%   BMI 34.84 kg/m   BP/Weight 09/03/2016 06/05/2016 76/06/9507  Systolic BP 326 712 458  Diastolic BP 85 84 74  Wt. (Lbs) 203 200.8 201.6  BMI 34.84 34.47 34.6    Physical Exam  Constitutional: She is oriented to person, place, and time. She  appears well-developed and well-nourished. No distress.  HENT:  Head: Normocephalic and atraumatic.  Neck: No thyromegaly present.  Cardiovascular: Normal rate, regular rhythm, normal heart sounds and intact distal pulses.   Pulmonary/Chest: Effort normal and breath sounds normal. Right breast exhibits tenderness (lateral breast on L and R ). Right breast exhibits no inverted nipple, no mass, no nipple discharge and no skin change. Left breast exhibits tenderness. Left breast exhibits no inverted nipple, no mass, no nipple discharge and no skin change. Breasts are symmetrical.    Musculoskeletal: She exhibits no edema.  Lymphadenopathy:    She has no cervical adenopathy.  Neurological: She is alert and oriented to person, place, and time.  Skin: Skin is warm and dry. No rash noted.  Psychiatric: She has a normal mood and affect.    Lab Results  Component Value Date   TSH 0.41 06/05/2016    Assessment & Plan:  Kristen Moore was seen today for hypothyroidism.  Diagnoses and all orders for this visit:  Hypothyroidism, unspecified type -     TSH -     T3, Free -     Cancel: T4, free; Future -     T4, free  Rotator cuff impingement syndrome of right shoulder -     Ambulatory referral to Orthopedic Surgery   There are no diagnoses linked to this encounter.  No orders of the defined types were placed in this encounter.   Follow-up: Return in about 3 months (around 12/03/2016) for hypothyroidism .   Boykin Nearing MD

## 2016-09-04 ENCOUNTER — Telehealth: Payer: Self-pay

## 2016-09-04 LAB — T4, FREE: Free T4: 1.58 ng/dL (ref 0.82–1.77)

## 2016-09-04 LAB — T3, FREE: T3, Free: 3 pg/mL (ref 2.0–4.4)

## 2016-09-04 LAB — TSH: TSH: 0.662 u[IU]/mL (ref 0.450–4.500)

## 2016-09-04 NOTE — Telephone Encounter (Signed)
Patient's BP was normal I recommend that she not take a diuretic due to risk of dehydration of low BP Advise low salt diet and compression socks

## 2016-09-04 NOTE — Telephone Encounter (Signed)
Pt contacted the office and stated she saw Dr. Adrian Blackwater yesterday and she forgot to mention that both legs and ankles swells. Pt states her left leg swells more than her right. Pt is wanting to know if it is okay to take fluid pills and if it is if she can get a rx for it, but if not if it is okay to take otc. Pt states her ankles are swollen now. Pt states she goes to work at [employer]. Pt states if she able to get a rx she would like it sent to our pharmacy. Please f/u

## 2016-09-04 NOTE — Telephone Encounter (Signed)
Will route to PCP 

## 2016-09-05 ENCOUNTER — Telehealth: Payer: Self-pay

## 2016-09-05 NOTE — Telephone Encounter (Signed)
Pt was called and informed of Dr. Adrian Blackwater orders.

## 2016-09-05 NOTE — Telephone Encounter (Signed)
Pt was called and informed of lab results. 

## 2016-09-25 MED FILL — ESTRADIOL 1 MG TABLET: 1 | 30 days supply | Qty: 60 | Fill #3

## 2016-09-25 MED FILL — ?LEVOTHYROXINE 175 MCG TAB: 175 | 30 days supply | Qty: 30 | Fill #3

## 2016-10-15 ENCOUNTER — Other Ambulatory Visit: Payer: Self-pay | Admitting: Family Medicine

## 2016-10-17 ENCOUNTER — Encounter: Payer: Self-pay | Admitting: Family Medicine

## 2016-10-23 MED FILL — ESTRADIOL 1 MG TABLET: 1 | 30 days supply | Qty: 60 | Fill #4

## 2016-10-25 MED FILL — ?LEVOTHYROXINE 175 MCG TAB: 175 | 30 days supply | Qty: 30 | Fill #0

## 2016-11-29 MED FILL — ?LEVOTHYROXINE 175 MCG TAB: 175 | 30 days supply | Qty: 30 | Fill #1

## 2016-12-03 ENCOUNTER — Ambulatory Visit: Payer: Self-pay | Admitting: Family Medicine

## 2016-12-20 DIAGNOSIS — S93602A Unspecified sprain of left foot, initial encounter: Secondary | ICD-10-CM | POA: Insufficient documentation

## 2017-01-03 MED FILL — ?ESTRADIOL 1MG TABLET: 1 | 30 days supply | Qty: 60 | Fill #5

## 2017-01-03 MED FILL — LEVOTHYROXINE 175 MCG TAB: 175 | 30 days supply | Qty: 30 | Fill #2

## 2017-01-28 ENCOUNTER — Other Ambulatory Visit: Payer: Self-pay | Admitting: Family Medicine

## 2017-01-28 DIAGNOSIS — Z90710 Acquired absence of both cervix and uterus: Secondary | ICD-10-CM

## 2017-01-28 DIAGNOSIS — E8940 Asymptomatic postprocedural ovarian failure: Secondary | ICD-10-CM

## 2017-02-06 MED FILL — LEVOTHYROXINE 175 MCG TAB: 175 | 30 days supply | Qty: 30 | Fill #3

## 2017-03-04 ENCOUNTER — Other Ambulatory Visit: Payer: Self-pay | Admitting: Family Medicine

## 2017-03-04 DIAGNOSIS — E8940 Asymptomatic postprocedural ovarian failure: Secondary | ICD-10-CM

## 2017-03-04 DIAGNOSIS — Z90710 Acquired absence of both cervix and uterus: Secondary | ICD-10-CM

## 2017-03-06 ENCOUNTER — Telehealth: Payer: Self-pay | Admitting: Family Medicine

## 2017-03-06 NOTE — Telephone Encounter (Signed)
Will forward to [facility], who patient is establishing care with

## 2017-03-06 NOTE — Telephone Encounter (Signed)
Pt called requesting medication refill on levothyroxine (SYNTHROID, LEVOTHROID) 175 MCG tablet,  estradiol (ESTRACE) 2 MG tablet. Please f/up

## 2017-03-08 ENCOUNTER — Other Ambulatory Visit: Payer: Self-pay | Admitting: Family Medicine

## 2017-03-08 DIAGNOSIS — E8940 Asymptomatic postprocedural ovarian failure: Secondary | ICD-10-CM

## 2017-03-08 DIAGNOSIS — E039 Hypothyroidism, unspecified: Secondary | ICD-10-CM

## 2017-03-08 DIAGNOSIS — Z90710 Acquired absence of both cervix and uterus: Secondary | ICD-10-CM

## 2017-03-08 MED ORDER — LEVOTHYROXINE SODIUM 175 MCG PO TABS: ORAL_TABLET | ORAL | 0 refills | Status: DC

## 2017-03-08 MED ORDER — ESTRADIOL 2 MG PO TABS: 2.0000 mg | ORAL_TABLET | Freq: Every day | ORAL | 0 refills | Status: DC

## 2017-03-08 MED FILL — ?LEVOTHYROXINE 175 MCG TAB: 175 | 30 days supply | Qty: 30 | Fill #0

## 2017-03-08 MED FILL — ESTRADIOL 2 MG TABLET: 2 | 30 days supply | Qty: 30 | Fill #0

## 2017-03-08 NOTE — Telephone Encounter (Signed)
Refills sent to [facility]. Recommend scheduling appointment for follow up and to establish care. No additional refills without office visit.

## 2017-03-08 NOTE — Telephone Encounter (Signed)
CMA call regarding medication refill is ready   Patient did not answer but CMA left a detailed message & if have any questions just to call back

## 2017-04-12 ENCOUNTER — Encounter (HOSPITAL_COMMUNITY): Payer: Self-pay

## 2017-04-12 ENCOUNTER — Ambulatory Visit: Payer: Self-pay | Admitting: Family Medicine

## 2017-05-06 DIAGNOSIS — Z780 Asymptomatic menopausal state: Secondary | ICD-10-CM | POA: Insufficient documentation

## 2017-05-06 DIAGNOSIS — E7800 Pure hypercholesterolemia, unspecified: Secondary | ICD-10-CM | POA: Insufficient documentation

## 2017-05-06 DIAGNOSIS — F329 Major depressive disorder, single episode, unspecified: Secondary | ICD-10-CM | POA: Insufficient documentation

## 2017-05-06 DIAGNOSIS — R600 Localized edema: Secondary | ICD-10-CM | POA: Insufficient documentation

## 2017-05-07 DIAGNOSIS — M25521 Pain in right elbow: Secondary | ICD-10-CM

## 2017-05-07 HISTORY — DX: Pain in right elbow: M25.521

## 2017-06-13 DIAGNOSIS — M25611 Stiffness of right shoulder, not elsewhere classified: Secondary | ICD-10-CM | POA: Insufficient documentation

## 2017-06-13 DIAGNOSIS — R293 Abnormal posture: Secondary | ICD-10-CM | POA: Insufficient documentation

## 2017-06-13 DIAGNOSIS — M6281 Muscle weakness (generalized): Secondary | ICD-10-CM | POA: Insufficient documentation

## 2017-06-13 DIAGNOSIS — M542 Cervicalgia: Secondary | ICD-10-CM | POA: Insufficient documentation

## 2017-07-09 DIAGNOSIS — G8929 Other chronic pain: Secondary | ICD-10-CM | POA: Insufficient documentation

## 2017-07-15 ENCOUNTER — Other Ambulatory Visit: Payer: Self-pay

## 2017-07-15 DIAGNOSIS — Z1231 Encounter for screening mammogram for malignant neoplasm of breast: Secondary | ICD-10-CM

## 2017-08-09 ENCOUNTER — Emergency Department (HOSPITAL_COMMUNITY)
Admission: EM | Admit: 2017-08-09 | Discharge: 2017-08-09 | Disposition: A | Discharge status: Home / Self Care | Payer: Self-pay | Attending: Emergency Medicine | Admitting: Emergency Medicine

## 2017-08-09 ENCOUNTER — Other Ambulatory Visit: Payer: Self-pay

## 2017-08-09 ENCOUNTER — Emergency Department (HOSPITAL_COMMUNITY): Payer: Self-pay

## 2017-08-09 ENCOUNTER — Encounter (HOSPITAL_COMMUNITY): Payer: Self-pay | Admitting: *Deleted

## 2017-08-09 DIAGNOSIS — F17210 Nicotine dependence, cigarettes, uncomplicated: Secondary | ICD-10-CM | POA: Insufficient documentation

## 2017-08-09 DIAGNOSIS — R21 Rash and other nonspecific skin eruption: Secondary | ICD-10-CM | POA: Insufficient documentation

## 2017-08-09 DIAGNOSIS — Z79899 Other long term (current) drug therapy: Secondary | ICD-10-CM | POA: Insufficient documentation

## 2017-08-09 DIAGNOSIS — R600 Localized edema: Secondary | ICD-10-CM | POA: Insufficient documentation

## 2017-08-09 DIAGNOSIS — E039 Hypothyroidism, unspecified: Secondary | ICD-10-CM | POA: Insufficient documentation

## 2017-08-09 DIAGNOSIS — Z91040 Latex allergy status: Secondary | ICD-10-CM | POA: Insufficient documentation

## 2017-08-09 DIAGNOSIS — J45909 Unspecified asthma, uncomplicated: Secondary | ICD-10-CM | POA: Insufficient documentation

## 2017-08-09 LAB — COMPREHENSIVE METABOLIC PANEL
ALT: 61 U/L — ABNORMAL HIGH (ref 14–54)
AST: 40 U/L (ref 15–41)
Albumin: 4.2 g/dL (ref 3.5–5.0)
Alkaline Phosphatase: 55 U/L (ref 38–126)
Anion gap: 9 (ref 5–15)
BUN: 10 mg/dL (ref 6–20)
CO2: 21 mmol/L — ABNORMAL LOW (ref 22–32)
Calcium: 9.5 mg/dL (ref 8.9–10.3)
Chloride: 106 mmol/L (ref 101–111)
Creatinine, Ser: 0.82 mg/dL (ref 0.44–1.00)
GFR calc Af Amer: >60 mL/min (ref >60)
GFR calc non Af Amer: >60 mL/min (ref >60)
Glucose, Bld: 90 mg/dL (ref 65–99)
Potassium: 4 mmol/L (ref 3.5–5.1)
Sodium: 136 mmol/L (ref 135–145)
Total Bilirubin: 0.7 mg/dL (ref 0.3–1.2)
Total Protein: 7.1 g/dL (ref 6.5–8.1)

## 2017-08-09 LAB — CBC WITH DIFFERENTIAL/PLATELET
Basophils Absolute: 0 10*3/uL (ref 0.0–0.1)
Basophils Relative: 1 %
Eosinophils Absolute: 0.2 10*3/uL (ref 0.0–0.7)
Eosinophils Relative: 4 %
HCT: 44.1 % (ref 36.0–46.0)
Hemoglobin: 15.2 g/dL — ABNORMAL HIGH (ref 12.0–15.0)
Lymphocytes Relative: 29 %
Lymphs Abs: 1.8 10*3/uL (ref 0.7–4.0)
MCH: 32.6 pg (ref 26.0–34.0)
MCHC: 34.5 g/dL (ref 30.0–36.0)
MCV: 94.6 fL (ref 78.0–100.0)
Monocytes Absolute: 0.3 10*3/uL (ref 0.1–1.0)
Monocytes Relative: 4 %
Neutro Abs: 3.8 10*3/uL (ref 1.7–7.7)
Neutrophils Relative %: 62 %
Platelets: 186 10*3/uL (ref 150–400)
RBC: 4.66 MIL/uL (ref 3.87–5.11)
RDW: 12.5 % (ref 11.5–15.5)
WBC: 6.1 10*3/uL (ref 4.0–10.5)

## 2017-08-09 NOTE — ED Provider Notes (Addendum)
Patient placed in Quick Look pathway, seen and evaluated   Chief Complaint: Bilateral leg swelling, rash  HPI:   47 year old female presents with worsening bilateral leg swelling and a rash. She states she broke up with her boyfriend a week ago and has been on her feet quite a bit due to moving. She has had gradually worsening bilateral leg swelling. She has had this in the past but does not know why. She has been given compression stockings but she states they hurt and she thinks they are making it worse. She also reports a rash on her left foot and left thigh. It is very itchy. She used lidocaine cream which helped. It seems to be getting better today.  ROS: +bilateral leg swelling, rash  - fever, chest pain, SOB  Physical Exam:   Gen: No distress  Neuro: Awake and Alert  Skin: Warm    Focused Exam: There is 2+ edema of bilateral legs from feet to knees. Mild erythema on dorsal aspect of foot and over lateral thigh which is non-specific. Minimal tenderness of legs. 2+ DP pulses bilaterally.   Plan: Labs, CXR. She's had an evaluation for the same on 10/16/16 in [place]. Results are available in CareEverywhere    Initiation of care has begun. The patient has been counseled on the process, plan, and necessity for staying for the completion/evaluation, and the remainder of the medical screening examination        Recardo Evangelist, PA-C 08/09/17 [place], MD 08/10/17 1116

## 2017-08-09 NOTE — ED Notes (Signed)
Patient given discharge instructions and verbalized understanding.  Patient stable to discharge at this time.  Patient is alert and oriented to baseline.  No distressed noted at this time.  All belongings taken with the patient at discharge.   

## 2017-08-09 NOTE — ED Provider Notes (Signed)
[facility] EMERGENCY DEPARTMENT Provider Note   CSN: [id number] Arrival date & time: 08/09/17  1416     History   Chief Complaint Chief Complaint  Patient presents with  . Rash  . Leg Swelling    HPI Kristen Moore is a 47 y.o. female.  47 year old female with prior history of bipolar disease, dysfunctional uterine bleeding, IBS, and asthma presents with complaint of lower extremity edema.  Patient reports that this is been a long-standing problem for the last several months.  Patient denies significant increase in her edema recently.  Patient did notice a mild rash to the left lateral hip.  Patient denies fever.  Patient denies pain.  Patient denies shortness of breath, nausea, vomiting, chest pain, or other specific complaint.  Patient reports that she has a new patient appointment with her primary provider in about 1 week (she recently moved to [place] from [place]).   The history is provided by the patient.  Illness  This is a chronic problem. The current episode started more than 1 week ago. The problem occurs constantly. The problem has not changed since onset.Pertinent negatives include no chest pain, no abdominal pain, no headaches and no shortness of breath. Nothing aggravates the symptoms. Nothing relieves the symptoms. She has tried nothing for the symptoms. The treatment provided no relief.    Past Medical History:  Diagnosis Date  . Abnormal Pap smear    cryo  . Anemia   . Asthma   . Bipolar 1 disorder ([place])   . Cyst of breast    left breast  . DUB (dysfunctional uterine bleeding)   . Fibroids   . Headache(784.0)   . Hypothyroidism   . IBS (irritable bowel syndrome)   . Thyroid dysfunction     Patient Active Problem List   Diagnosis Date Noted  . Rotator cuff impingement syndrome 09/03/2016  . 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 IBS (irritable bowel syndrome) 10/21/2015  .  Dyshidrotic hand dermatitis 10/21/2015  . Dyshidrotic foot dermatitis 10/21/2015  . Bright red blood per rectum 10/21/2015  . S/P bilateral oophorectomy 10/21/2015  . Hypothyroidism 10/21/2015  . HLD (hyperlipidemia) 03/18/2015  . Knee pain, right 12/13/2014  . Pelvic pain 12/25/2012  . CHEST PAIN UNSPECIFIED 11/05/2008  . ELECTROCARDIOGRAM, ABNORMAL 11/05/2008    Past Surgical History:  Procedure Laterality Date  . ABDOMINAL HYSTERECTOMY Bilateral 12/25/2012   Procedure: HYSTERECTOMY ABDOMINAL;  Surgeon: Emily Filbert, MD;  Location: [facility];  Service: Gynecology;  Laterality: Bilateral;  TAH and bilateral salpingectomy  . BILATERAL SALPINGECTOMY Bilateral 12/25/2012   Procedure: BILATERAL SALPINGECTOMY;  Surgeon: Emily Filbert, MD;  Location: [facility];  Service: Gynecology;  Laterality: Bilateral;  . CERVICAL BIOPSY  W/ LOOP ELECTRODE EXCISION    . CHOLECYSTECTOMY    . COLON SURGERY    . DILATION AND CURETTAGE OF UTERUS    . OOPHORECTOMY Bilateral 12/25/2012   Procedure: OOPHORECTOMY;  Surgeon: Emily Filbert, MD;  Location: [facility];  Service: Gynecology;  Laterality: Bilateral;  . TUBAL LIGATION      OB History    Gravida Para Term Preterm AB Living   3 2 1 1 1 1    SAB TAB Ectopic Multiple Live Births     1             Home Medications    Prior to Admission medications   Medication Sig Start Date End Date Taking? Authorizing Provider  Biotin 5 MG CAPS Take 5 mg by mouth.    [provider]  buPROPion (WELLBUTRIN XL) 150 MG 24 hr tablet Take 150 mg by mouth daily.    [provider]  cyclobenzaprine (FLEXERIL) 5 MG tablet Take 5 mg by mouth 2 (two) times daily as needed for muscle spasms.    [provider]  estradiol (ESTRACE) 2 MG tablet Take 1 tablet (2 mg total) by mouth daily. 03/08/17   Alfonse Spruce, FNP  hydrOXYzine (ATARAX/VISTARIL) 10 MG tablet Take 10 mg by mouth 3 (three) times daily as needed.    [provider]  levothyroxine  (SYNTHROID, LEVOTHROID) 175 MCG tablet TAKE ONE TABLET BY MOUTH ONCE DAILY BEFORE BREAKFAST 03/08/17   Fredia Beets R, FNP  lithium carbonate 300 MG capsule Take 300-600 mg by mouth 2 (two) times daily with a meal. Takes 300 mg in the morning at 0800, 300 mg at 1600, and 600 mg at bedtime    [provider]  Multiple Vitamin (MULTIVITAMIN) capsule Take 1 capsule by mouth daily.    [provider]  psyllium (REGULOID) 0.52 g capsule Take 0.52 g by mouth daily. Reported on 11/28/2015    [provider]  traZODone (DESYREL) 50 MG tablet Take 50 mg by mouth at bedtime.    [provider]    Family History Family History  Problem Relation Age of Onset  . Heart disease Father   . Cancer Father 15       lung  . Hypertension Mother   . Rheum arthritis Mother   . Fibromyalgia Mother   . COPD Mother   . Sleep apnea Mother   . Breast cancer Mother   . Breast cancer Maternal Grandmother   . Breast cancer Paternal Aunt   . Breast cancer Maternal Aunt     Social History Social History   Tobacco Use  . Smoking status: Current Every Day Smoker    Packs/day: 0.50    Years: 9.00    Pack years: 4.50    Types: Cigarettes  . Smokeless tobacco: Never Used  Substance Use Topics  . Alcohol use: No    Alcohol/week: 0.0 oz  . Drug use: No     Allergies   Latex; Penicillins; and Other   Review of Systems Review of Systems  Respiratory: Negative for shortness of breath.   Cardiovascular: Negative for chest pain.  Gastrointestinal: Negative for abdominal pain.  Musculoskeletal:       Bilateral lower extremity edema   Skin: Positive for rash.  Neurological: Negative for headaches.  All other systems reviewed and are negative.    Physical Exam Updated Vital Signs Ht 5\' 4"  (1.626 m)   Wt 90.7 kg (200 lb)   LMP 11/29/2012   BMI 34.33 kg/m   Physical Exam  Constitutional: She is oriented to person, place, and time. She appears well-developed  and well-nourished. No distress.  HENT:  Head: Normocephalic and atraumatic.  Mouth/Throat: Oropharynx is clear and moist.  Eyes: Conjunctivae and EOM are normal. Pupils are equal, round, and reactive to light.  Neck: Normal range of motion. Neck supple.  Cardiovascular: Normal rate, regular rhythm and normal heart sounds.  Pulmonary/Chest: Effort normal and breath sounds normal. No respiratory distress.  Abdominal: Soft. She exhibits no distension. There is no tenderness.  Musculoskeletal: Normal range of motion. She exhibits no edema or deformity.  Neurological: She is alert and oriented to person, place, and time.  Skin: Skin is warm and  dry. No rash noted.  No appreciable rash   1-2 plus edema to BLE   Psychiatric: She has a normal mood and affect.  Nursing note and vitals reviewed.    ED Treatments / Results  Labs (all labs ordered are listed, but only abnormal results are displayed) Labs Reviewed  COMPREHENSIVE METABOLIC PANEL - Abnormal; Notable for the following components:      Result Value   CO2 21 (*)    ALT 61 (*)    All other components within normal limits  CBC WITH DIFFERENTIAL/PLATELET - Abnormal; Notable for the following components:   Hemoglobin 15.2 (*)    All other components within normal limits    EKG  EKG Interpretation None       Radiology Dg Chest 2 View  Result Date: 08/09/2017 CLINICAL DATA:  Bilateral leg swelling. EXAM: CHEST - 2 VIEW COMPARISON:  Chest x-ray dated 02/27/2012 FINDINGS: The heart size and mediastinal contours are within normal limits. Both lungs are clear. The visualized skeletal structures are unremarkable. IMPRESSION: Normal chest. Electronically Signed   By: Lorriane Shire M.D.   On: 08/09/2017 16:14    Procedures Procedures (including critical care time)  Medications Ordered in ED Medications - No data to display   Initial Impression / Assessment and Plan / ED Course  I have reviewed the triage vital signs and the  nursing notes.  Pertinent labs & imaging results that were available during my care of the patient were reviewed by me and considered in my medical decision making (see chart for details).     MDM  Screen complete  Patient is presenting with complaint of bilateral lower extremity edema.  This appears to be a long-standing complaint.  Screening labs performed today in the ED do not suggest more acute pathology.  CBC and CMP are without significant acute findings.  Chest x-ray is also clear and without acute findings.  There is no appreciable rash found on exam.  Patient is strongly encouraged to follow-up with her new primary care provider in about 1 week.  This appointment has been made per her report.  Strict return precautions given and understood.  Basic instructions for home management of lower extremity edema given and understood.   Final Clinical Impressions(s) / ED Diagnoses   Final diagnoses:  Leg edema    ED Discharge Orders    None       Valarie Merino, MD 08/09/17 1725

## 2017-08-09 NOTE — ED Triage Notes (Signed)
Pt c/o bil leg swelling onset x 1 wk, pt c/o L leg rash, all skin intact, denies SOB, denies n/v/d denies CP, A&O x4, pt reports recent increase in activity going upstairs with recent move, pt has  History of leg swelling

## 2017-08-12 DIAGNOSIS — IMO0001 Reserved for inherently not codable concepts without codable children: Secondary | ICD-10-CM | POA: Insufficient documentation

## 2017-08-12 DIAGNOSIS — R600 Localized edema: Secondary | ICD-10-CM | POA: Insufficient documentation

## 2017-08-27 ENCOUNTER — Encounter (INDEPENDENT_AMBULATORY_CARE_PROVIDER_SITE_OTHER): Payer: Self-pay | Admitting: Physician Assistant

## 2017-08-27 ENCOUNTER — Other Ambulatory Visit: Payer: Self-pay

## 2017-08-27 ENCOUNTER — Ambulatory Visit (INDEPENDENT_AMBULATORY_CARE_PROVIDER_SITE_OTHER): Payer: Self-pay | Admitting: Physician Assistant

## 2017-08-27 VITALS — BP 137/97 | HR 66 | Temp 97.9°F | Ht 64.0 in | Wt 221.4 lb

## 2017-08-27 DIAGNOSIS — E8940 Asymptomatic postprocedural ovarian failure: Secondary | ICD-10-CM

## 2017-08-27 DIAGNOSIS — N3000 Acute cystitis without hematuria: Secondary | ICD-10-CM

## 2017-08-27 DIAGNOSIS — Z131 Encounter for screening for diabetes mellitus: Secondary | ICD-10-CM

## 2017-08-27 DIAGNOSIS — E039 Hypothyroidism, unspecified: Secondary | ICD-10-CM

## 2017-08-27 DIAGNOSIS — Z90710 Acquired absence of both cervix and uterus: Secondary | ICD-10-CM

## 2017-08-27 DIAGNOSIS — R3989 Other symptoms and signs involving the genitourinary system: Secondary | ICD-10-CM

## 2017-08-27 DIAGNOSIS — R079 Chest pain, unspecified: Secondary | ICD-10-CM

## 2017-08-27 DIAGNOSIS — M6740 Ganglion, unspecified site: Secondary | ICD-10-CM

## 2017-08-27 DIAGNOSIS — R600 Localized edema: Secondary | ICD-10-CM

## 2017-08-27 DIAGNOSIS — R1084 Generalized abdominal pain: Secondary | ICD-10-CM

## 2017-08-27 LAB — POCT URINALYSIS DIPSTICK
Bilirubin, UA: NEGATIVE
Blood, UA: NEGATIVE
Glucose, UA: NEGATIVE
Ketones, UA: NEGATIVE
Nitrite, UA: POSITIVE
Protein, UA: NEGATIVE
Spec Grav, UA: 1.015 (ref 1.010–1.025)
Urobilinogen, UA: 0.2 E.U./dL
pH, UA: 7.5 (ref 5.0–8.0)

## 2017-08-27 LAB — POCT GLYCOSYLATED HEMOGLOBIN (HGB A1C): Hemoglobin A1C: 4.8

## 2017-08-27 MED ORDER — ESTRADIOL 2 MG PO TABS: 2.0000 mg | ORAL_TABLET | Freq: Every day | ORAL | 1 refills | Status: DC

## 2017-08-27 MED ORDER — CIPROFLOXACIN HCL 500 MG PO TABS: 500.0000 mg | ORAL_TABLET | Freq: Two times a day (BID) | ORAL | 0 refills | Status: AC

## 2017-08-27 MED ORDER — FUROSEMIDE 20 MG PO TABS: 20.0000 mg | ORAL_TABLET | Freq: Every day | ORAL | 3 refills | Status: DC

## 2017-08-27 NOTE — Progress Notes (Signed)
Subjective:  Patient ID: Kristen Moore, female    DOB: 1971-05-27  Age: 47 y.o. MRN: [id number]  CC: Leg edema  HPI Kristen Moore is a 47 y.o. female with a medical history of asthma, anemia, bipolar 1 disorder, hypothyroidism, IBS, uterine fibroids, hysterectomy, oophorectomy, and cholestectomy. Ran out of thyroid medication yesterday. Estradiol also ran out yesterday and requests refills. Complains of bilateral lower extremity edema. Went to ED 18 days ago for the edema with no acute process found as the cause of her edema. Advised to f/u with PCP. She tried compression stockings but LE pain is worse with the stockings. Has obtained some relief of swelling when she elevates legs above heart level at night in bed. Pt complains of generalized abdominal discomfort, chest pain, some malaise, urinary frequency, abnormal color to urine, and a small bump on ulnar aspect of right wrist.   Outpatient Medications Prior to Visit  Medication Sig Dispense Refill  . buPROPion (WELLBUTRIN XL) 150 MG 24 hr tablet Take 150 mg by mouth daily.    Marland Kitchen estradiol (ESTRACE) 2 MG tablet Take 1 tablet (2 mg total) by mouth daily. 60 tablet 0  . hydrOXYzine (ATARAX/VISTARIL) 10 MG tablet Take 10 mg by mouth 3 (three) times daily as needed.    Marland Kitchen levothyroxine (SYNTHROID, LEVOTHROID) 150 MCG tablet Take 150 mcg by mouth daily before breakfast.    . lithium carbonate 300 MG capsule Take 300-600 mg by mouth 2 (two) times daily with a meal. Takes 300 mg in the morning at 0800, 300 mg at 1600, and 600 mg at bedtime    . Multiple Vitamin (MULTIVITAMIN) capsule Take 1 capsule by mouth daily.    . traZODone (DESYREL) 50 MG tablet Take 50 mg by mouth at bedtime.    Marland Kitchen levothyroxine (SYNTHROID, LEVOTHROID) 175 MCG tablet TAKE ONE TABLET BY MOUTH ONCE DAILY BEFORE BREAKFAST (Patient not taking: Reported on 08/27/2017) 30 tablet 0  . Biotin 5 MG CAPS Take 5 mg by mouth.    . cyclobenzaprine (FLEXERIL) 5 MG tablet Take 5  mg by mouth 2 (two) times daily as needed for muscle spasms.    Marland Kitchen psyllium (REGULOID) 0.52 g capsule Take 0.52 g by mouth daily. Reported on 11/28/2015     No facility-administered medications prior to visit.      ROS Review of Systems  Constitutional: Positive for malaise/fatigue. Negative for chills and fever.  Eyes: Negative for blurred vision.  Respiratory: Negative for shortness of breath.   Cardiovascular: Positive for chest pain. Negative for palpitations.  Gastrointestinal: Positive for abdominal pain. Negative for nausea.  Genitourinary: Positive for frequency. Negative for dysuria and hematuria.  Musculoskeletal: Negative for joint pain and myalgias.  Skin: Negative for rash.  Neurological: Negative for tingling and headaches.  Psychiatric/Behavioral: Negative for depression. The patient is not nervous/anxious.     Objective:  BP (!) 137/97 (BP Location: Right Arm, Patient Position: Sitting, Cuff Size: Normal)   Pulse 66   Temp 97.9 F (36.6 C) (Oral)   Ht 5\' 4"  (1.626 m)   Wt 221 lb 6.4 oz (100.4 kg)   LMP 11/29/2012   SpO2 95%   BMI 38.00 kg/m   BP/Weight 08/27/2017 0/02/3266 06/05/4578  Systolic BP 998 338 250  Diastolic BP 97 83 85  Wt. (Lbs) 221.4 200 203  BMI 38 34.33 34.84      Physical Exam  Constitutional: She is oriented to person, place, and time.  Well developed, well nourished, NAD, polite  HENT:  Head: Normocephalic and atraumatic.  Eyes: Conjunctivae are normal. No scleral icterus.  Neck: Normal range of motion. Neck supple. No thyromegaly present.  Cardiovascular: Normal rate, regular rhythm and normal heart sounds.  Pulmonary/Chest: Effort normal and breath sounds normal.  Abdominal: Soft. Bowel sounds are normal. There is tenderness (mild right flank pain).  Genitourinary:  Genitourinary Comments: No CVA tenderness bilaterally  Musculoskeletal: She exhibits no edema.  Small, freely movable, rubbery, nodule on volar aspect of right wrist   Neurological: She is alert and oriented to person, place, and time. No cranial nerve deficit. Coordination normal.  Skin: Skin is warm and dry. No rash noted. No erythema. No pallor.  Psychiatric: Her behavior is normal. Thought content normal.  Seems depressed and anxious  Vitals reviewed.    Assessment & Plan:    1. Screening for diabetes mellitus - HgB A1c 4.8%  2. Hypothyroidism, unspecified type - Thyroid Panel With TSH; Future  3. Bilateral lower extremity edema - ACTH; Future - Hepatitis panel, acute; Future - Sedimentation Rate; Future - C-reactive protein; Future - Brain natriuretic peptide; Future - ANA w/Reflex; Future - furosemide (LASIX) 20 MG tablet; Take 1 tablet (20 mg total) by mouth daily.  Dispense: 30 tablet; Refill: 3 - Elevate legs above heart level.  4. Ganglion cyst - This is benign. Educated patient and reassured her.   5. Chest pain, unspecified type - EKG 12-Lead NSR in clinic today.  6. Abnormal urine color - Urinalysis Dipstick abnormal  7. Generalized abdominal pain - Likely attributed to UTI. Tenderness worse at right lower flank and RLQ.  8. Acute cystitis without hematuria - Urine Culture - ciprofloxacin (CIPRO) 500 MG tablet; Take 1 tablet (500 mg total) by mouth 2 (two) times daily for 3 days.  Dispense: 6 tablet; Refill: 0  9. Post hysterectomy menopause - estradiol (ESTRACE) 2 MG tablet; Take 1 tablet (2 mg total) by mouth daily.  Dispense: 60 tablet; Refill: 1 - pt will need to eventually see endocrinologist but she has to apply for orange card and CAFA.  Meds ordered this encounter  Medications  . ciprofloxacin (CIPRO) 500 MG tablet    Sig: Take 1 tablet (500 mg total) by mouth 2 (two) times daily for 3 days.    Dispense:  6 tablet    Refill:  0    Order Specific Question:   Supervising Provider    Answer:   Tresa Garter [id number]  . furosemide (LASIX) 20 MG tablet    Sig: Take 1 tablet (20 mg total) by mouth  daily.    Dispense:  30 tablet    Refill:  3    Order Specific Question:   Supervising Provider    Answer:   Tresa Garter [id number]  . estradiol (ESTRACE) 2 MG tablet    Sig: Take 1 tablet (2 mg total) by mouth daily.    Dispense:  60 tablet    Refill:  1    Must have office visit for refills.    Order Specific Question:   Supervising Provider    Answer:   Tresa Garter [[id number]]    Follow-up: Return in about 2 weeks (around 09/10/2017) for f/u UTI and LE edema.   Clent Demark PA

## 2017-08-27 NOTE — Patient Instructions (Signed)

## 2017-08-28 ENCOUNTER — Other Ambulatory Visit (INDEPENDENT_AMBULATORY_CARE_PROVIDER_SITE_OTHER): Payer: Self-pay

## 2017-08-28 DIAGNOSIS — E039 Hypothyroidism, unspecified: Secondary | ICD-10-CM

## 2017-08-28 DIAGNOSIS — R600 Localized edema: Secondary | ICD-10-CM

## 2017-08-29 ENCOUNTER — Other Ambulatory Visit (INDEPENDENT_AMBULATORY_CARE_PROVIDER_SITE_OTHER): Payer: Self-pay | Admitting: Physician Assistant

## 2017-08-29 DIAGNOSIS — E270 Other adrenocortical overactivity: Secondary | ICD-10-CM

## 2017-08-29 LAB — HEPATITIS PANEL, ACUTE
Hep A IgM: NEGATIVE
Hep B C IgM: NEGATIVE
Hep C Virus Ab: <0.1 s/co ratio (ref 0.0–0.9)
Hepatitis B Surface Ag: NEGATIVE

## 2017-08-29 LAB — ANA W/REFLEX: Anti Nuclear Antibody(ANA): NEGATIVE

## 2017-08-29 LAB — THYROID PANEL WITH TSH
Free Thyroxine Index: 2.4 (ref 1.2–4.9)
T3 Uptake Ratio: 22 % — ABNORMAL LOW (ref 24–39)
T4, Total: 10.7 ug/dL (ref 4.5–12.0)
TSH: 4.79 u[IU]/mL — ABNORMAL HIGH (ref 0.450–4.500)

## 2017-08-29 LAB — URINE CULTURE

## 2017-08-29 LAB — SEDIMENTATION RATE: Sed Rate: 2 mm/hr (ref 0–32)

## 2017-08-29 LAB — C-REACTIVE PROTEIN: CRP: 3.9 mg/L (ref 0.0–4.9)

## 2017-08-29 LAB — ACTH: ACTH: 87.4 pg/mL — ABNORMAL HIGH (ref 7.2–63.3)

## 2017-08-29 LAB — BRAIN NATRIURETIC PEPTIDE: BNP: 17.4 pg/mL (ref 0.0–100.0)

## 2017-08-30 ENCOUNTER — Telehealth (INDEPENDENT_AMBULATORY_CARE_PROVIDER_SITE_OTHER): Payer: Self-pay

## 2017-08-30 NOTE — Telephone Encounter (Signed)
-----   Message from Clent Demark, PA-C sent at 08/29/2017  5:27 PM EDT ----- E coli. Ciprofloxacin should have cleared the infection.

## 2017-08-30 NOTE — Telephone Encounter (Signed)
Opened in error. Nat Christen, CMA

## 2017-08-30 NOTE — Telephone Encounter (Signed)
Patient is aware that labs are essentially normal except for mildly elevated ACTH, which may likely be the cause of her lower extremity edema( leg swelling). She is aware that referral has been made for endocrinology and someone from their office will contact her to schedule appointment. Patient expressed understanding. Nat Christen, CMA

## 2017-08-30 NOTE — Telephone Encounter (Signed)
-----   Message from Clent Demark, PA-C sent at 08/29/2017  5:51 PM EDT ----- Labs are essentially normal except for mildly elevated ACTH. This may likely be the cause of her lower extremity edema. She will be referred to endocrinology.

## 2017-08-30 NOTE — Telephone Encounter (Signed)
Patient aware that urine culture shows E coli, and that cipro should have cleared the infection. Patient stated she just picked up the Rx today and began taking it. Instructed patient to take as directed and until completion. Nat Christen, CMA

## 2017-09-10 ENCOUNTER — Ambulatory Visit (INDEPENDENT_AMBULATORY_CARE_PROVIDER_SITE_OTHER): Payer: Self-pay | Admitting: Physician Assistant

## 2017-09-10 ENCOUNTER — Encounter (INDEPENDENT_AMBULATORY_CARE_PROVIDER_SITE_OTHER): Payer: Self-pay | Admitting: Physician Assistant

## 2017-09-10 ENCOUNTER — Other Ambulatory Visit: Payer: Self-pay

## 2017-09-10 VITALS — BP 115/80 | HR 72 | Temp 97.8°F | Ht 64.0 in | Wt 216.4 lb

## 2017-09-10 DIAGNOSIS — R600 Localized edema: Secondary | ICD-10-CM

## 2017-09-10 DIAGNOSIS — E270 Other adrenocortical overactivity: Secondary | ICD-10-CM

## 2017-09-10 DIAGNOSIS — E039 Hypothyroidism, unspecified: Secondary | ICD-10-CM

## 2017-09-10 MED ORDER — FUROSEMIDE 20 MG PO TABS: 40.0000 mg | ORAL_TABLET | Freq: Every day | ORAL | 1 refills | Status: DC

## 2017-09-10 MED ORDER — LEVOTHYROXINE SODIUM 150 MCG PO TABS: 150.0000 ug | ORAL_TABLET | Freq: Every day | ORAL | 1 refills | Status: DC

## 2017-09-10 NOTE — Progress Notes (Signed)
Subjective:  Patient ID: Kristen Moore, female    DOB: 20-Apr-1971  Age: 47 y.o. MRN: [id number]  CC: f/u UTI and LE edema  HPI Kristen Moore is a 47 y.o. female with a medical history of asthma, anemia, bipolar 1 disorder, hypothyroidism, IBS, uterine fibroids, hysterectomy, oophorectomy, and cholecystectomy presents on f/u of lower extremity edema. Still has lower extremity edema but is mildly better than previous. Not regularly wearing her compression stockings but has used them at home before and tries not to sleep with them on. ACTH was also found to be mildly elevated. Endocrinology referral was made and authorized but patient has not obtained orange card yet. No other symptoms or complaints.       Outpatient Medications Prior to Visit  Medication Sig Dispense Refill  . buPROPion (WELLBUTRIN XL) 150 MG 24 hr tablet Take 150 mg by mouth daily.    Marland Kitchen estradiol (ESTRACE) 2 MG tablet Take 1 tablet (2 mg total) by mouth daily. 60 tablet 1  . furosemide (LASIX) 20 MG tablet Take 1 tablet (20 mg total) by mouth daily. 30 tablet 3  . hydrOXYzine (ATARAX/VISTARIL) 10 MG tablet Take 10 mg by mouth 3 (three) times daily as needed.    . lithium carbonate 300 MG capsule Take 300-600 mg by mouth 2 (two) times daily with a meal. Takes 300 mg in the morning at 0800, 300 mg at 1600, and 600 mg at bedtime    . Multiple Vitamin (MULTIVITAMIN) capsule Take 1 capsule by mouth daily.    . traZODone (DESYREL) 50 MG tablet Take 50 mg by mouth at bedtime.    Marland Kitchen levothyroxine (SYNTHROID, LEVOTHROID) 150 MCG tablet Take 150 mcg by mouth daily before breakfast.    . levothyroxine (SYNTHROID, LEVOTHROID) 175 MCG tablet TAKE ONE TABLET BY MOUTH ONCE DAILY BEFORE BREAKFAST (Patient not taking: Reported on 08/27/2017) 30 tablet 0   No facility-administered medications prior to visit.      ROS Review of Systems  Constitutional: Negative for chills, fever and malaise/fatigue.  Eyes: Negative for  blurred vision.  Respiratory: Negative for shortness of breath.   Cardiovascular: Positive for leg swelling. Negative for chest pain and palpitations.  Gastrointestinal: Negative for abdominal pain and nausea.  Genitourinary: Negative for dysuria and hematuria.  Musculoskeletal: Negative for joint pain and myalgias.  Skin: Negative for rash.  Neurological: Negative for tingling and headaches.  Psychiatric/Behavioral: Negative for depression. The patient is not nervous/anxious.     Objective:  BP 115/80 (BP Location: Left Arm, Patient Position: Sitting, Cuff Size: Large)   Pulse 72   Temp 97.8 F (36.6 C) (Oral)   Ht 5\' 4"  (1.626 m)   Wt 216 lb 6.4 oz (98.2 kg)   LMP 11/29/2012   SpO2 97%   BMI 37.14 kg/m   BP/Weight 09/10/2017 1/94/1740 01/03/4480  Systolic BP 856 314 970  Diastolic BP 80 97 83  Wt. (Lbs) 216.4 221.4 200  BMI 37.14 38 34.33      Physical Exam  Constitutional: She is oriented to person, place, and time. She appears well-developed and well-nourished.  NAD, obese, polite  HENT:  Head: Normocephalic and atraumatic.  Eyes: No scleral icterus.  Neck: Normal range of motion. Neck supple.  Cardiovascular: Normal rate, regular rhythm, normal heart sounds and intact distal pulses.  Bilateral lower extremity edema mild to moderately better than previous visit; 1-2 + pitting edema up to mid shin bilaterally. Previous 2-3+ pitting edema up to knee bilaterally  Pulmonary/Chest:  Effort normal and breath sounds normal.  Abdominal:  Obese abdomen.  Musculoskeletal: She exhibits no tenderness.  Neurological: She is alert and oriented to person, place, and time.  Skin: Skin is warm and dry. No rash noted. She is not diaphoretic.     Assessment & Plan:    1. ACTH elevation [facility]) - Endocrinology referral made. Waiting for pt to obtain her orange card.   2. Bilateral lower extremity edema - Increase and use PRN: furosemide (LASIX) 20 MG tablet; Take 2 tablets (40 mg  total) by mouth daily.  Dispense: 60 tablet; Refill: 1  3. Hypothyroidism, unspecified type - Begin levothyroxine (SYNTHROID, LEVOTHROID) 150 MCG tablet; Take 1 tablet (150 mcg total) by mouth daily.  Dispense: 90 tablet; Refill: 1   Meds ordered this encounter  Medications  . furosemide (LASIX) 20 MG tablet    Sig: Take 2 tablets (40 mg total) by mouth daily.    Dispense:  60 tablet    Refill:  1    Order Specific Question:   Supervising Provider    Answer:   Tresa Garter [id number]  . levothyroxine (SYNTHROID, LEVOTHROID) 150 MCG tablet    Sig: Take 1 tablet (150 mcg total) by mouth daily.    Dispense:  90 tablet    Refill:  1    Order Specific Question:   Supervising Provider    Answer:   Tresa Garter [id number]    Follow-up: Return in about 1 month (around 10/08/2017) for LE edema.   Clent Demark PA

## 2017-09-10 NOTE — Patient Instructions (Signed)
Edema Edema is an abnormal buildup of fluids in your bodytissues. Edema is somewhatdependent on gravity to pull the fluid to the lowest place in your body. That makes the condition more common in the legs and thighs (lower extremities). Painless swelling of the feet and ankles is common and becomes more likely as you get older. It is also common in looser tissues, like around your eyes. When the affected area is squeezed, the fluid may move out of that spot and leave a dent for a few moments. This dent is called pitting. What are the causes? There are many possible causes of edema. Eating too much salt and being on your feet or sitting for a long time can cause edema in your legs and ankles. Hot weather may make edema worse. Common medical causes of edema include:  Heart failure.  Liver disease.  Kidney disease.  Weak blood vessels in your legs.  Cancer.  An injury.  Pregnancy.  Some medications.  Obesity.  What are the signs or symptoms? Edema is usually painless.Your skin may look swollen or shiny. How is this diagnosed? Your health care provider may be able to diagnose edema by asking about your medical history and doing a physical exam. You may need to have tests such as X-rays, an electrocardiogram, or blood tests to check for medical conditions that may cause edema. How is this treated? Edema treatment depends on the cause. If you have heart, liver, or kidney disease, you need the treatment appropriate for these conditions. General treatment may include:  Elevation of the affected body part above the level of your heart.  Compression of the affected body part. Pressure from elastic bandages or support stockings squeezes the tissues and forces fluid back into the blood vessels. This keeps fluid from entering the tissues.  Restriction of fluid and salt intake.  Use of a water pill (diuretic). These medications are appropriate only for some types of edema. They pull fluid  out of your body and make you urinate more often. This gets rid of fluid and reduces swelling, but diuretics can have side effects. Only use diuretics as directed by your health care provider.  Follow these instructions at home:  Keep the affected body part above the level of your heart when you are lying down.  Do not sit still or stand for prolonged periods.  Do not put anything directly under your knees when lying down.  Do not wear constricting clothing or garters on your upper legs.  Exercise your legs to work the fluid back into your blood vessels. This may help the swelling go down.  Wear elastic bandages or support stockings to reduce ankle swelling as directed by your health care provider.  Eat a low-salt diet to reduce fluid if your health care provider recommends it.  Only take medicines as directed by your health care provider. Contact a health care provider if:  Your edema is not responding to treatment.  You have heart, liver, or kidney disease and notice symptoms of edema.  You have edema in your legs that does not improve after elevating them.  You have sudden and unexplained weight gain. Get help right away if:  You develop shortness of breath or chest pain.  You cannot breathe when you lie down.  You develop pain, redness, or warmth in the swollen areas.  You have heart, liver, or kidney disease and suddenly get edema.  You have a fever and your symptoms suddenly get worse. This information is   not intended to replace advice given to you by your health care provider. Make sure you discuss any questions you have with your health care provider. Document Released: 05/21/2005 Document Revised: 10/27/2015 Document Reviewed: 03/13/2013 Elsevier Interactive Patient Education  2017 Elsevier Inc.  

## 2017-09-10 NOTE — Progress Notes (Signed)
Patient states she feels better since completing Abx, but still has edema in both legs Patient complains that her hands are breaking out

## 2017-09-23 ENCOUNTER — Ambulatory Visit: Payer: Self-pay | Attending: Physician Assistant

## 2017-10-07 ENCOUNTER — Telehealth: Payer: Self-pay

## 2017-10-07 NOTE — Telephone Encounter (Signed)
Patient called to know the statuses of her application call back number  [phone]

## 2017-10-08 ENCOUNTER — Encounter (INDEPENDENT_AMBULATORY_CARE_PROVIDER_SITE_OTHER): Payer: Self-pay | Admitting: Physician Assistant

## 2017-10-08 ENCOUNTER — Ambulatory Visit (INDEPENDENT_AMBULATORY_CARE_PROVIDER_SITE_OTHER): Payer: Self-pay | Admitting: Physician Assistant

## 2017-10-08 VITALS — BP 107/69 | HR 55 | Temp 97.6°F | Resp 18 | Ht 64.0 in | Wt 214.0 lb

## 2017-10-08 DIAGNOSIS — R600 Localized edema: Secondary | ICD-10-CM

## 2017-10-08 DIAGNOSIS — Z79899 Other long term (current) drug therapy: Secondary | ICD-10-CM

## 2017-10-08 MED ORDER — FUROSEMIDE 40 MG PO TABS: 80.0000 mg | ORAL_TABLET | Freq: Every day | ORAL | 2 refills | Status: DC

## 2017-10-08 NOTE — Patient Instructions (Signed)
Edema Edema is when you have too much fluid in your body or under your skin. Edema may make your legs, feet, and ankles swell up. Swelling is also common in looser tissues, like around your eyes. This is a common condition. It gets more common as you get older. There are many possible causes of edema. Eating too much salt (sodium) and being on your feet or sitting for a long time can cause edema in your legs, feet, and ankles. Hot weather may make edema worse. Edema is usually painless. Your skin may look swollen or shiny. Follow these instructions at home:  Keep the swollen body part raised (elevated) above the level of your heart when you are sitting or lying down.  Do not sit still or stand for a long time.  Do not wear tight clothes. Do not wear garters on your upper legs.  Exercise your legs. This can help the swelling go down.  Wear elastic bandages or support stockings as told by your doctor.  Eat a low-salt (low-sodium) diet to reduce fluid as told by your doctor.  Depending on the cause of your swelling, you may need to limit how much fluid you drink (fluid restriction).  Take over-the-counter and prescription medicines only as told by your doctor. Contact a doctor if:  Treatment is not working.  You have heart, liver, or kidney disease and have symptoms of edema.  You have sudden and unexplained weight gain. Get help right away if:  You have shortness of breath or chest pain.  You cannot breathe when you lie down.  You have pain, redness, or warmth in the swollen areas.  You have heart, liver, or kidney disease and get edema all of a sudden.  You have a fever and your symptoms get worse all of a sudden. Summary  Edema is when you have too much fluid in your body or under your skin.  Edema may make your legs, feet, and ankles swell up. Swelling is also common in looser tissues, like around your eyes.  Raise (elevate) the swollen body part above the level of your  heart when you are sitting or lying down.  Follow your doctor's instructions about diet and how much fluid you can drink (fluid restriction). This information is not intended to replace advice given to you by your health care provider. Make sure you discuss any questions you have with your health care provider. Document Released: 11/07/2007 Document Revised: 06/08/2016 Document Reviewed: 06/08/2016 Elsevier Interactive Patient Education  2017 Elsevier Inc.  

## 2017-10-08 NOTE — Progress Notes (Signed)
Subjective:  Patient ID: Kristen Moore, female    DOB: 11-29-1970  Age: 47 y.o. MRN: [id number]  CC: f/u LE edema  HPI Kristen Moore a 47 y.o.femalewith a medical history of asthma, anemia, bipolar 1 disorder, hypothyroidism, IBS, uterine fibroids, hysterectomy, oophorectomy, and cholecystectomy presents to f/u on bilateral LE edema. Had Lasix increased last month to 40 mg daily. Says her bilateral LE is moderately better and somewhat less painful. Endorses fatigue, still has not been able to see endocrinologist about abnormal ACTH. Does not endorse any other symptoms or complaints.     Outpatient Medications Prior to Visit  Medication Sig Dispense Refill  . buPROPion (WELLBUTRIN XL) 150 MG 24 hr tablet Take 150 mg by mouth daily.    Marland Kitchen estradiol (ESTRACE) 2 MG tablet Take 1 tablet (2 mg total) by mouth daily. 60 tablet 1  . furosemide (LASIX) 20 MG tablet Take 2 tablets (40 mg total) by mouth daily. 60 tablet 1  . hydrOXYzine (ATARAX/VISTARIL) 10 MG tablet Take 10 mg by mouth 3 (three) times daily as needed.    Marland Kitchen levothyroxine (SYNTHROID, LEVOTHROID) 150 MCG tablet Take 1 tablet (150 mcg total) by mouth daily. 90 tablet 1  . lithium carbonate 300 MG capsule Take 300-600 mg by mouth 2 (two) times daily with a meal. Takes 300 mg in the morning at 0800, 300 mg at 1600, and 600 mg at bedtime    . Multiple Vitamin (MULTIVITAMIN) capsule Take 1 capsule by mouth daily.    . traZODone (DESYREL) 50 MG tablet Take 50 mg by mouth at bedtime.     No facility-administered medications prior to visit.      ROS Review of Systems  Constitutional: Positive for malaise/fatigue. Negative for chills and fever.  Eyes: Negative for blurred vision.  Respiratory: Negative for shortness of breath.   Cardiovascular: Positive for leg swelling. Negative for chest pain and palpitations.  Gastrointestinal: Negative for abdominal pain and nausea.  Genitourinary: Negative for dysuria and  hematuria.  Musculoskeletal: Negative for joint pain and myalgias.  Skin: Negative for rash.  Neurological: Negative for tingling and headaches.  Psychiatric/Behavioral: Negative for depression. The patient is not nervous/anxious.     Objective:  BP 107/69 (BP Location: Left Arm, Patient Position: Sitting, Cuff Size: Large)   Pulse (!) 55   Temp 97.6 F (36.4 C) (Oral)   Resp 18   Ht 5\' 4"  (1.626 m)   Wt 214 lb (97.1 kg)   LMP 11/29/2012   SpO2 96%   BMI 36.73 kg/m   BP/Weight 10/08/2017 09/10/2017 06/04/7508  Systolic BP 258 527 782  Diastolic BP 69 80 97  Wt. (Lbs) 214 216.4 221.4  BMI 36.73 37.14 38      Physical Exam  Constitutional: She is oriented to person, place, and time.  Well developed, well nourished, NAD, polite  HENT:  Head: Normocephalic and atraumatic.  Eyes: No scleral icterus.  Neck: Normal range of motion. Neck supple. No thyromegaly present.  Cardiovascular: Normal rate, regular rhythm and normal heart sounds.  2+ pitting edema bilaterally. Seems improved over previous.   Pulmonary/Chest: Effort normal and breath sounds normal.  Musculoskeletal: She exhibits no edema.  Neurological: She is alert and oriented to person, place, and time.  Skin: Skin is warm and dry. No rash noted. No erythema. No pallor.  Psychiatric: She has a normal mood and affect. Her behavior is normal. Thought content normal.  Vitals reviewed.    Assessment & Plan:  1. High risk medication use - Basic Metabolic Panel  2. Bilateral lower extremity edema - furosemide (LASIX) 40 MG tablet; Take 2 tablets (80 mg total) by mouth daily.  Dispense: 60 tablet; Refill: 2   Meds ordered this encounter  Medications  . furosemide (LASIX) 40 MG tablet    Sig: Take 2 tablets (80 mg total) by mouth daily.    Dispense:  60 tablet    Refill:  2    Order Specific Question:   Supervising Provider    Answer:   Tresa Garter [id number]    Follow-up: Return in about 1 month  (around 11/05/2017) for LE edema .   Clent Demark PA

## 2017-10-09 ENCOUNTER — Ambulatory Visit (INDEPENDENT_AMBULATORY_CARE_PROVIDER_SITE_OTHER): Payer: Self-pay | Admitting: Physician Assistant

## 2017-10-09 LAB — BASIC METABOLIC PANEL
BUN/Creatinine Ratio: 16 (ref 9–23)
BUN: 12 mg/dL (ref 6–24)
CO2: 22 mmol/L (ref 20–29)
Calcium: 9.8 mg/dL (ref 8.7–10.2)
Chloride: 100 mmol/L (ref 96–106)
Creatinine, Ser: 0.74 mg/dL (ref 0.57–1.00)
GFR calc Af Amer: 114 mL/min/{1.73_m2} (ref >59)
GFR calc non Af Amer: 99 mL/min/{1.73_m2} (ref >59)
Glucose: 95 mg/dL (ref 65–99)
Potassium: 3.8 mmol/L (ref 3.5–5.2)
Sodium: 140 mmol/L (ref 134–144)

## 2017-11-14 ENCOUNTER — Ambulatory Visit (INDEPENDENT_AMBULATORY_CARE_PROVIDER_SITE_OTHER): Payer: Self-pay | Admitting: Physician Assistant

## 2017-11-14 ENCOUNTER — Encounter (INDEPENDENT_AMBULATORY_CARE_PROVIDER_SITE_OTHER): Payer: Self-pay | Admitting: Physician Assistant

## 2017-11-14 ENCOUNTER — Other Ambulatory Visit: Payer: Self-pay

## 2017-11-14 VITALS — BP 136/89 | HR 68 | Temp 97.8°F | Ht 64.0 in | Wt 210.8 lb

## 2017-11-14 DIAGNOSIS — R519 Headache, unspecified: Secondary | ICD-10-CM

## 2017-11-14 DIAGNOSIS — E039 Hypothyroidism, unspecified: Secondary | ICD-10-CM

## 2017-11-14 DIAGNOSIS — R51 Headache: Secondary | ICD-10-CM

## 2017-11-14 DIAGNOSIS — R600 Localized edema: Secondary | ICD-10-CM

## 2017-11-14 DIAGNOSIS — Z79899 Other long term (current) drug therapy: Secondary | ICD-10-CM

## 2017-11-14 MED ORDER — ACETAMINOPHEN 500 MG PO TABS: 500.0000 mg | ORAL_TABLET | Freq: Three times a day (TID) | ORAL | 0 refills | Status: DC | PRN

## 2017-11-14 MED ORDER — POTASSIUM CHLORIDE ER 10 MEQ PO TBCR: 10.0000 meq | EXTENDED_RELEASE_TABLET | Freq: Two times a day (BID) | ORAL | 3 refills | Status: DC

## 2017-11-14 NOTE — Progress Notes (Signed)
Subjective:  Patient ID: Kristen Moore, female    DOB: 1970-08-07  Age: 47 y.o. MRN: [id number]  CC: f/u LE edema  HPI Kristen Moore a 47 y.o.femalewith a medical history of asthma, anemia, bipolar 1 disorder, hypothyroidism, IBS, uterine fibroids, hysterectomy, oophorectomy, and cholecystectomypresents to f/u on bilateral LE edema. Had Lasix increased last month to 40 mg BID from qday. Says her bilateral LE is much better and somewhat less painful. Mostly taking Lasix 40 qday. Has more swelling in the LLE than RLE but also has recently gotten a tattoo LLE and a smaller one on the RLE. Endorses fatigue and new onset headache x1 week. Took Excedrin,with temporary relief. Says she also has much stress with her daughter bringing in a 45 yo man to live with them. Still has not been able to see endocrinologist about abnormal ACTH but has an appointment on 12/03/17. Does not endorse any other symptoms or complaints.       Outpatient Medications Prior to Visit  Medication Sig Dispense Refill  . buPROPion (WELLBUTRIN XL) 150 MG 24 hr tablet Take 150 mg by mouth daily.    Marland Kitchen estradiol (ESTRACE) 2 MG tablet Take 1 tablet (2 mg total) by mouth daily. 60 tablet 1  . furosemide (LASIX) 20 MG tablet Take 2 tablets (40 mg total) by mouth daily. 60 tablet 1  . furosemide (LASIX) 40 MG tablet Take 2 tablets (80 mg total) by mouth daily. 60 tablet 2  . hydrOXYzine (ATARAX/VISTARIL) 10 MG tablet Take 10 mg by mouth 3 (three) times daily as needed.    Marland Kitchen levothyroxine (SYNTHROID, LEVOTHROID) 150 MCG tablet Take 1 tablet (150 mcg total) by mouth daily. 90 tablet 1  . lithium carbonate 300 MG capsule Take 300-600 mg by mouth 2 (two) times daily with a meal. Takes 300 mg in the morning at 0800, 300 mg at 1600, and 600 mg at bedtime    . Multiple Vitamin (MULTIVITAMIN) capsule Take 1 capsule by mouth daily.    . traZODone (DESYREL) 50 MG tablet Take 50 mg by mouth at bedtime.     No  facility-administered medications prior to visit.      ROS Review of Systems  Constitutional: Negative for chills, fever and malaise/fatigue.  Eyes: Negative for blurred vision.  Respiratory: Negative for shortness of breath.   Cardiovascular: Positive for leg swelling. Negative for chest pain and palpitations.  Gastrointestinal: Negative for abdominal pain and nausea.  Genitourinary: Negative for dysuria and hematuria.  Musculoskeletal: Negative for joint pain and myalgias.  Skin: Negative for rash.  Neurological: Positive for headaches. Negative for tingling.  Psychiatric/Behavioral: Negative for depression. The patient is not nervous/anxious.     Objective:  Ht 5\' 4"  (1.626 m)   Wt 210 lb 12.8 oz (95.6 kg)   LMP 11/29/2012   BMI 36.18 kg/m   BP/Weight 11/14/2017 12/08/7339 02/04/7901  Systolic BP 409 735 329  Diastolic BP 89 69 80  Wt. (Lbs) 210.8 214 216.4  BMI 36.18 36.73 37.14      Physical Exam  Constitutional: She is oriented to person, place, and time.  Well developed, well nourished, NAD, polite  HENT:  Head: Normocephalic and atraumatic.  Eyes: Pupils are equal, round, and reactive to light. No scleral icterus.  Neck: Normal range of motion. Neck supple. No thyromegaly present.  Cardiovascular: Normal rate, regular rhythm, normal heart sounds and intact distal pulses. Exam reveals no gallop and no friction rub.  No murmur heard. 1+ pitting edema  of the lower extremity bilaterally  Pulmonary/Chest: Effort normal and breath sounds normal.  Musculoskeletal: She exhibits no edema.  Lymphadenopathy:    She has no cervical adenopathy.  Neurological: She is alert and oriented to person, place, and time.  Skin: Skin is warm and dry. No rash noted. No erythema. No pallor.  Thinning hair  Psychiatric: She has a normal mood and affect. Her behavior is normal. Thought content normal.  Vitals reviewed.    Assessment & Plan:    1. Bilateral lower extremity edema -  Basic Metabolic Panel  2. Hypothyroidism, unspecified type - Thyroid Panel With TSH  3. High risk medication use - Potassium has been normal but trending down. Asked patient to fill written rx should her potassium be low on today's analysis. Potassium chloride (K-DUR) 10 MEQ tablet; Take 1 tablet (10 mEq total) by mouth 2 (two) times daily.  Dispense: 60 tablet; Refill: 3 - BMP  Meds ordered this encounter  Medications  . potassium chloride (K-DUR) 10 MEQ tablet    Sig: Take 1 tablet (10 mEq total) by mouth 2 (two) times daily.    Dispense:  60 tablet    Refill:  3    Order Specific Question:   Supervising Provider    Answer:   Charlott Rakes [[id number]]    Follow-up: PRN, after endocrinology consult  Clent Demark PA

## 2017-11-15 ENCOUNTER — Telehealth (INDEPENDENT_AMBULATORY_CARE_PROVIDER_SITE_OTHER): Payer: Self-pay

## 2017-11-15 LAB — BASIC METABOLIC PANEL
BUN/Creatinine Ratio: 14 (ref 9–23)
BUN: 11 mg/dL (ref 6–24)
CO2: 23 mmol/L (ref 20–29)
Calcium: 9.9 mg/dL (ref 8.7–10.2)
Chloride: 102 mmol/L (ref 96–106)
Creatinine, Ser: 0.76 mg/dL (ref 0.57–1.00)
GFR calc Af Amer: 110 mL/min/{1.73_m2} (ref >59)
GFR calc non Af Amer: 96 mL/min/{1.73_m2} (ref >59)
Glucose: 88 mg/dL (ref 65–99)
Potassium: 4 mmol/L (ref 3.5–5.2)
Sodium: 138 mmol/L (ref 134–144)

## 2017-11-15 LAB — THYROID PANEL WITH TSH
Free Thyroxine Index: 2.2 (ref 1.2–4.9)
T3 Uptake Ratio: 19 % — ABNORMAL LOW (ref 24–39)
T4, Total: 11.4 ug/dL (ref 4.5–12.0)
TSH: 2.99 u[IU]/mL (ref 0.450–4.500)

## 2017-11-15 NOTE — Telephone Encounter (Signed)
-----   Message from Clent Demark, PA-C sent at 11/15/2017 12:20 PM EDT ----- Continue on same dose of levothyroxine. Kidney filtration and potassium are normal.

## 2017-11-15 NOTE — Telephone Encounter (Signed)
Patient is aware to continue on current dose levothyroxine. Kidney filtration and potassium are normal. Nat Christen, CMA

## 2017-12-02 ENCOUNTER — Other Ambulatory Visit (INDEPENDENT_AMBULATORY_CARE_PROVIDER_SITE_OTHER): Payer: Self-pay | Admitting: Physician Assistant

## 2017-12-02 DIAGNOSIS — Z90710 Acquired absence of both cervix and uterus: Secondary | ICD-10-CM

## 2017-12-02 DIAGNOSIS — E8940 Asymptomatic postprocedural ovarian failure: Secondary | ICD-10-CM

## 2017-12-02 NOTE — Telephone Encounter (Signed)
Please refill if appropriate

## 2017-12-03 ENCOUNTER — Ambulatory Visit (INDEPENDENT_AMBULATORY_CARE_PROVIDER_SITE_OTHER): Payer: Self-pay | Admitting: Endocrinology

## 2017-12-03 ENCOUNTER — Encounter: Payer: Self-pay | Admitting: Endocrinology

## 2017-12-03 DIAGNOSIS — E237 Disorder of pituitary gland, unspecified: Secondary | ICD-10-CM

## 2017-12-03 LAB — CORTISOL
Cortisol, Plasma: 22.2 ug/dL
Cortisol, Plasma: 3.6 ug/dL

## 2017-12-03 MED ORDER — COSYNTROPIN NICU IV SYRINGE 0.25 MG/ML (STANDARD DOSE)
0.2500 mg | Freq: Once | INTRAVENOUS | Status: AC
  Administered 2017-12-03: 0.25 mg via INTRAVENOUS

## 2017-12-03 MED ORDER — DEXAMETHASONE 1 MG PO TABS: ORAL_TABLET | ORAL | 0 refills | Status: DC

## 2017-12-03 NOTE — Progress Notes (Signed)
Subjective:    Patient ID: Kristen Moore, female    DOB: 04/02/1971, 47 y.o.   MRN: [id number]  HPI Pt is referred by Domenica Fail, PA, for elevated ACTH.  no h/o abdominal or brain injury.  No h/o cancer, seizures, hypoglycemia, amyloidosis, tuberculosis, or diabetes.  she has never been on steroid therapy.  No h/o ketoconazole, rifampin, or dilantin.  She took [facility] many years ago, for bodybuilding.  She has many years of intermittent moderate headache, worst at the left parietal area, and assoc leg swelling.   Past Medical History:  Diagnosis Date  . Abnormal Pap smear    cryo  . Anemia   . Asthma   . Bipolar 1 disorder ([place])   . Cyst of breast    left breast  . DUB (dysfunctional uterine bleeding)   . Fibroids   . Headache(784.0)   . Hypothyroidism   . IBS (irritable bowel syndrome)   . Thyroid dysfunction     Past Surgical History:  Procedure Laterality Date  . ABDOMINAL HYSTERECTOMY Bilateral 12/25/2012   Procedure: HYSTERECTOMY ABDOMINAL;  Surgeon: Emily Filbert, MD;  Location: [facility];  Service: Gynecology;  Laterality: Bilateral;  TAH and bilateral salpingectomy  . BILATERAL SALPINGECTOMY Bilateral 12/25/2012   Procedure: BILATERAL SALPINGECTOMY;  Surgeon: Emily Filbert, MD;  Location: [facility];  Service: Gynecology;  Laterality: Bilateral;  . CERVICAL BIOPSY  W/ LOOP ELECTRODE EXCISION    . CHOLECYSTECTOMY    . COLON SURGERY    . DILATION AND CURETTAGE OF UTERUS    . OOPHORECTOMY Bilateral 12/25/2012   Procedure: OOPHORECTOMY;  Surgeon: Emily Filbert, MD;  Location: [facility];  Service: Gynecology;  Laterality: Bilateral;  . TUBAL LIGATION      Social History   Socioeconomic History  . Marital status: Single    Spouse name: Not on file  . Number of children: Not on file  . Years of education: Not on file  . Highest education level: Not on file  Occupational History  . Not on file  Social Needs  . Financial resource strain: Not on file  . Food insecurity:    Worry:  Not on file    Inability: Not on file  . Transportation needs:    Medical: Not on file    Non-medical: Not on file  Tobacco Use  . Smoking status: Current Every Day Smoker    Packs/day: 0.50    Years: 9.00    Pack years: 4.50    Types: Cigarettes  . Smokeless tobacco: Never Used  Substance and Sexual Activity  . Alcohol use: No    Alcohol/week: 0.0 oz  . Drug use: No  . Sexual activity: Yes    Birth control/protection: Surgical  Lifestyle  . Physical activity:    Days per week: Not on file    Minutes per session: Not on file  . Stress: Not on file  Relationships  . Social connections:    Talks on phone: Not on file    Gets together: Not on file    Attends religious service: Not on file    Active member of club or organization: Not on file    Attends meetings of clubs or organizations: Not on file    Relationship status: Not on file  . Intimate partner violence:    Fear of current or ex partner: Not on file    Emotionally abused: Not on file    Physically abused: Not on file  Forced sexual activity: Not on file  Other Topics Concern  . Not on file  Social History Narrative  . Not on file    Current Outpatient Medications on File Prior to Visit  Medication Sig Dispense Refill  . acetaminophen (TYLENOL) 500 MG tablet Take 1 tablet (500 mg total) by mouth every 8 (eight) hours as needed. 15 tablet 0  . buPROPion (WELLBUTRIN XL) 150 MG 24 hr tablet Take 150 mg by mouth daily.    Marland Kitchen estradiol (ESTRACE) 2 MG tablet Take 1 tablet (2 mg total) by mouth daily. 60 tablet 1  . hydrOXYzine (ATARAX/VISTARIL) 10 MG tablet Take 10 mg by mouth 3 (three) times daily as needed.    Marland Kitchen levothyroxine (SYNTHROID, LEVOTHROID) 150 MCG tablet Take 1 tablet (150 mcg total) by mouth daily. 90 tablet 1  . lithium carbonate 300 MG capsule Take 300-600 mg by mouth 2 (two) times daily with a meal. Takes 300 mg in the morning at 0800, 300 mg at 1600, and 600 mg at bedtime    . Multiple Vitamin  (MULTIVITAMIN) capsule Take 1 capsule by mouth daily.    . potassium chloride (K-DUR) 10 MEQ tablet Take 1 tablet (10 mEq total) by mouth 2 (two) times daily. (Patient not taking: Reported on 12/03/2017) 60 tablet 3  . traZODone (DESYREL) 50 MG tablet Take 50 mg by mouth at bedtime.     No current facility-administered medications on file prior to visit.      Family History  Problem Relation Age of Onset  . Heart disease Father   . Cancer Father 15       lung  . Hypertension Mother   . Rheum arthritis Mother   . Fibromyalgia Mother   . COPD Mother   . Sleep apnea Mother   . Breast cancer Mother   . Breast cancer Maternal Grandmother   . Breast cancer Paternal Aunt   . Breast cancer Maternal Aunt     BP 138/82 (BP Location: Left Arm, Patient Position: Sitting, Cuff Size: Normal)   Pulse 73   Ht 5\' 4"  (1.626 m)   Wt 211 lb 3.2 oz (95.8 kg)   LMP 11/29/2012   SpO2 96%   BMI 36.25 kg/m     Review of Systems Denies fever, sob, palpitations, blurry vision, n/v, seizure, muscle weakness, cold intolerance, vitiligo, excessive sweating, or change in skin tone. She has hair loss, fatigue, dizziness, easy bruising, diarrhea, weight gain, intermitt abd pain, and anxiety.       Objective:   Physical Exam VS: see vs page GEN: no distress HEAD: head: no deformity.  Slight alopecia on the head.   eyes: no periorbital swelling, no proptosis external nose and ears are normal mouth: no lesion seen NECK: supple, thyroid is not enlarged CHEST WALL: no deformity LUNGS: clear to auscultation CV: reg rate and rhythm, no murmur ABD: abdomen is soft, nontender.  no hepatosplenomegaly.  not distended.  no hernia MUSCULOSKELETAL: muscle bulk and strength are grossly normal.  no obvious joint swelling.  gait is normal and steady EXTEMITIES: no deformity.  no ulcer on the feet.  feet are of normal color and temp.  no edema PULSES: dorsalis pedis intact bilat.  no carotid bruit NEURO:  cn 2-12  grossly intact.   readily moves all 4's.  sensation is intact to touch on the feet.  SKIN:  Normal texture and temperature.  No rash or suspicious lesion is visible.  No vitiligo. NODES:  None palpable at  the neck PSYCH: alert, well-oriented.  Does not appear anxious nor depressed.     ACTH=87 ACTH stimulation test is done: baseline cortisol level=4 then Cosyntropin 250 mcg is given IM 45 minutes later, cortisol level=22 (normal response)  Lab Results  Component Value Date   TSH 2.990 11/14/2017   T4TOTAL 11.4 11/14/2017   I have reviewed outside records, and summarized: Pt was noted to have elevated ACTH, and referred here.  Main problem addressed was LE edema.  She was euthyroid      Assessment & Plan:  elev ACTH, new.  No indication if this would be primary or secondary, so we'll check for both.  We'll also recheck ACTH.    Patient Instructions  blood tests are requested for you today.  We'll let you know about the results. You should do a "dexamethasone suppression test."  For this, you would take dexamethasone 1 mg at 10 pm (I have sent a prescription to your pharmacy), then come in for a "cortisol" blood test the next morning before 9 am.  You do not need to be fasting for this test. Even if the e tests are normal, please come back for a follow-up appointment in 3 months.

## 2017-12-03 NOTE — Patient Instructions (Addendum)
blood tests are requested for you today.  We'll let you know about the results. You should do a "dexamethasone suppression test."  For this, you would take dexamethasone 1 mg at 10 pm (I have sent a prescription to your pharmacy), then come in for a "cortisol" blood test the next morning before 9 am.  You do not need to be fasting for this test. Even if the e tests are normal, please come back for a follow-up appointment in 3 months.

## 2017-12-06 LAB — ACTH: C206 ACTH: 9 pg/mL (ref 6–50)

## 2017-12-11 ENCOUNTER — Other Ambulatory Visit (INDEPENDENT_AMBULATORY_CARE_PROVIDER_SITE_OTHER): Payer: Self-pay

## 2017-12-11 DIAGNOSIS — E237 Disorder of pituitary gland, unspecified: Secondary | ICD-10-CM

## 2017-12-11 LAB — CORTISOL: Cortisol, Plasma: 0.7 ug/dL

## 2017-12-16 ENCOUNTER — Telehealth: Payer: Self-pay | Admitting: Endocrinology

## 2017-12-16 NOTE — Telephone Encounter (Signed)
Pt aware of results 

## 2017-12-16 NOTE — Telephone Encounter (Signed)
Please call patient at ph# [phone] re: Lab Test results. Patient has been waiting to hear her results.

## 2017-12-31 ENCOUNTER — Other Ambulatory Visit: Payer: Self-pay

## 2017-12-31 ENCOUNTER — Encounter (INDEPENDENT_AMBULATORY_CARE_PROVIDER_SITE_OTHER): Payer: Self-pay | Admitting: Physician Assistant

## 2017-12-31 ENCOUNTER — Ambulatory Visit (INDEPENDENT_AMBULATORY_CARE_PROVIDER_SITE_OTHER): Payer: Self-pay | Admitting: Physician Assistant

## 2017-12-31 VITALS — BP 118/77 | HR 70 | Temp 98.4°F | Ht 64.0 in | Wt 205.6 lb

## 2017-12-31 DIAGNOSIS — R899 Unspecified abnormal finding in specimens from other organs, systems and tissues: Secondary | ICD-10-CM

## 2017-12-31 DIAGNOSIS — R600 Localized edema: Secondary | ICD-10-CM

## 2017-12-31 MED ORDER — FUROSEMIDE 20 MG PO TABS: 20.0000 mg | ORAL_TABLET | Freq: Every day | ORAL | 3 refills | Status: DC | PRN

## 2017-12-31 NOTE — Progress Notes (Signed)
Subjective:  Patient ID: [name], female    DOB: 12-21-1970  Age: 47 y.o. MRN: [id number]  CC: f/u endocrinology  HPI  Jakki Doughty [facility] a 47 y.o.femalewith a medical history of asthma, anemia, bipolar 1 disorder, hypothyroidism, IBS, uterine fibroids, hysterectomy, oophorectomy, and cholecystectomypresents to f/u on bilateral lower extremity edema and endocrinology consult. Had dexamethasone suppression testing done due to elevated ACTH. Suppression testing revealed all normal values. Pt still has lower extremity edema that is worse at the end of the day. Relieved with elevation of legs. Endorses much stress from 71 yo daughter and believes she may be doing drugs. Does not endorse any other complaints or symptoms.    Outpatient Medications Prior to Visit  Medication Sig Dispense Refill  . acetaminophen (TYLENOL) 500 MG tablet Take 1 tablet (500 mg total) by mouth every 8 (eight) hours as needed. 15 tablet 0  . buPROPion (WELLBUTRIN XL) 150 MG 24 hr tablet Take 150 mg by mouth daily.    Marland Kitchen estradiol (ESTRACE) 2 MG tablet Take 1 tablet (2 mg total) by mouth daily. 60 tablet 1  . hydrOXYzine (ATARAX/VISTARIL) 10 MG tablet Take 10 mg by mouth 3 (three) times daily as needed.    Marland Kitchen levothyroxine (SYNTHROID, LEVOTHROID) 150 MCG tablet Take 1 tablet (150 mcg total) by mouth daily. 90 tablet 1  . lithium carbonate 300 MG capsule Take 300-600 mg by mouth 2 (two) times daily with a meal. Takes 300 mg in the morning at 0800, 300 mg at 1600, and 600 mg at bedtime    . Multiple Vitamin (MULTIVITAMIN) capsule Take 1 capsule by mouth daily.    . traZODone (DESYREL) 50 MG tablet Take 50 mg by mouth at bedtime.    . potassium chloride (K-DUR) 10 MEQ tablet Take 1 tablet (10 mEq total) by mouth 2 (two) times daily. (Patient not taking: Reported on 12/03/2017) 60 tablet 3  . dexamethasone (DECADRON) 1 MG tablet 1 tab, at 9-10 PM, the night before blood test 1 tablet 0   No  facility-administered medications prior to visit.      ROS Review of Systems  Constitutional: Negative for chills, fever and malaise/fatigue.  Eyes: Negative for blurred vision.  Respiratory: Negative for shortness of breath.   Cardiovascular: Positive for leg swelling. Negative for chest pain and palpitations.  Gastrointestinal: Negative for abdominal pain and nausea.  Genitourinary: Negative for dysuria and hematuria.  Musculoskeletal: Negative for joint pain and myalgias.  Skin: Negative for rash.  Neurological: Negative for tingling and headaches.  Psychiatric/Behavioral: Negative for depression. The patient is not nervous/anxious.     Objective:  BP 118/77 (BP Location: Left Arm, Patient Position: Sitting, Cuff Size: Normal)   Pulse 70   Temp 98.4 F (36.9 C) (Oral)   Ht 5\' 4"  (1.626 m)   Wt 205 lb 9.6 oz (93.3 kg)   LMP 11/29/2012   SpO2 93%   BMI 35.29 kg/m   BP/Weight 12/31/2017 12/03/2017 2/37/6283  Systolic BP 151 761 607  Diastolic BP 77 82 89  Wt. (Lbs) 205.6 211.2 210.8  BMI 35.29 36.25 36.18      Physical Exam  Constitutional: She is oriented to person, place, and time.  Well developed, overweight, NAD, polite  HENT:  Head: Normocephalic and atraumatic.  Eyes: No scleral icterus.  Neck: Normal range of motion. Neck supple. No thyromegaly present.  Cardiovascular: Normal rate, regular rhythm and normal heart sounds.  Pulmonary/Chest: Effort normal and breath sounds normal.  Musculoskeletal: She  exhibits no edema.  Neurological: She is alert and oriented to person, place, and time.  Skin: Skin is warm and dry. No rash noted. No erythema. No pallor.  Psychiatric: She has a normal mood and affect. Her behavior is normal. Thought content normal.  Vitals reviewed.    Assessment & Plan:    1. Lower extremity edema - PRN furosemide (LASIX) 20 MG tablet; Take 1 tablet (20 mg total) by mouth daily as needed.  Dispense: 30 tablet; Refill: 3 - Advised to  wear compression stockings.   2. Abnormal laboratory test - ACTH abnormal. Endocrinology performed dexamethasone suppression testing which resulted in normal findings. Pt advised to keep endocrinology follow up for further testing.     Meds ordered this encounter  Medications  . furosemide (LASIX) 20 MG tablet    Sig: Take 1 tablet (20 mg total) by mouth daily as needed.    Dispense:  30 tablet    Refill:  3    Order Specific Question:   Supervising Provider    Answer:   Charlott Rakes [[id number]]    Follow-up: Return in about 4 months (around 05/03/2018) for LE edema.   Clent Demark PA

## 2017-12-31 NOTE — Patient Instructions (Signed)
Edema Edema is when you have too much fluid in your body or under your skin. Edema may make your legs, feet, and ankles swell up. Swelling is also common in looser tissues, like around your eyes. This is a common condition. It gets more common as you get older. There are many possible causes of edema. Eating too much salt (sodium) and being on your feet or sitting for a long time can cause edema in your legs, feet, and ankles. Hot weather may make edema worse. Edema is usually painless. Your skin may look swollen or shiny. Follow these instructions at home:  Keep the swollen body part raised (elevated) above the level of your heart when you are sitting or lying down.  Do not sit still or stand for a long time.  Do not wear tight clothes. Do not wear garters on your upper legs.  Exercise your legs. This can help the swelling go down.  Wear elastic bandages or support stockings as told by your doctor.  Eat a low-salt (low-sodium) diet to reduce fluid as told by your doctor.  Depending on the cause of your swelling, you may need to limit how much fluid you drink (fluid restriction).  Take over-the-counter and prescription medicines only as told by your doctor. Contact a doctor if:  Treatment is not working.  You have heart, liver, or kidney disease and have symptoms of edema.  You have sudden and unexplained weight gain. Get help right away if:  You have shortness of breath or chest pain.  You cannot breathe when you lie down.  You have pain, redness, or warmth in the swollen areas.  You have heart, liver, or kidney disease and get edema all of a sudden.  You have a fever and your symptoms get worse all of a sudden. Summary  Edema is when you have too much fluid in your body or under your skin.  Edema may make your legs, feet, and ankles swell up. Swelling is also common in looser tissues, like around your eyes.  Raise (elevate) the swollen body part above the level of your  heart when you are sitting or lying down.  Follow your doctor's instructions about diet and how much fluid you can drink (fluid restriction). This information is not intended to replace advice given to you by your health care provider. Make sure you discuss any questions you have with your health care provider. Document Released: 11/07/2007 Document Revised: 06/08/2016 Document Reviewed: 06/08/2016 Elsevier Interactive Patient Education  2017 Elsevier Inc.  

## 2018-01-01 ENCOUNTER — Telehealth (INDEPENDENT_AMBULATORY_CARE_PROVIDER_SITE_OTHER): Payer: Self-pay | Admitting: Physician Assistant

## 2018-01-01 NOTE — Telephone Encounter (Signed)
Opened in error

## 2018-01-30 ENCOUNTER — Other Ambulatory Visit: Payer: Self-pay

## 2018-01-30 ENCOUNTER — Encounter (INDEPENDENT_AMBULATORY_CARE_PROVIDER_SITE_OTHER): Payer: Self-pay | Admitting: Physician Assistant

## 2018-01-30 ENCOUNTER — Ambulatory Visit (INDEPENDENT_AMBULATORY_CARE_PROVIDER_SITE_OTHER): Payer: Self-pay | Admitting: Physician Assistant

## 2018-01-30 VITALS — BP 104/71 | HR 77 | Temp 98.4°F | Ht 64.0 in | Wt 200.2 lb

## 2018-01-30 DIAGNOSIS — L989 Disorder of the skin and subcutaneous tissue, unspecified: Secondary | ICD-10-CM

## 2018-01-30 DIAGNOSIS — R232 Flushing: Secondary | ICD-10-CM

## 2018-01-30 DIAGNOSIS — Z79899 Other long term (current) drug therapy: Secondary | ICD-10-CM

## 2018-01-30 MED ORDER — BLACK COHOSH 200 MG PO CAPS: 1.0000 | ORAL_CAPSULE | Freq: Two times a day (BID) | ORAL | 2 refills | Status: DC

## 2018-01-30 NOTE — Patient Instructions (Signed)
Menopause Menopause is the normal time of life when menstrual periods stop completely. Menopause is complete when you have missed 12 consecutive menstrual periods. It usually occurs between the ages of 48 years and 55 years. Very rarely does a woman develop menopause before the age of 40 years. At menopause, your ovaries stop producing the female hormones estrogen and progesterone. This can cause undesirable symptoms and also affect your health. Sometimes the symptoms may occur 4-5 years before the menopause begins. There is no relationship between menopause and:  Oral contraceptives.  Number of children you had.  Race.  The age your menstrual periods started (menarche).  Heavy smokers and very thin women may develop menopause earlier in life. What are the causes?  The ovaries stop producing the female hormones estrogen and progesterone. Other causes include:  Surgery to remove both ovaries.  The ovaries stop functioning for no known reason.  Tumors of the pituitary gland in the brain.  Medical disease that affects the ovaries and hormone production.  Radiation treatment to the abdomen or pelvis.  Chemotherapy that affects the ovaries.  What are the signs or symptoms?  Hot flashes.  Night sweats.  Decrease in sex drive.  Vaginal dryness and thinning of the vagina causing painful intercourse.  Dryness of the skin and developing wrinkles.  Headaches.  Tiredness.  Irritability.  Memory problems.  Weight gain.  Bladder infections.  Hair growth of the face and chest.  Infertility. More serious symptoms include:  Loss of bone (osteoporosis) causing breaks (fractures).  Depression.  Hardening and narrowing of the arteries (atherosclerosis) causing heart attacks and strokes.  How is this diagnosed?  When the menstrual periods have stopped for 12 straight months.  Physical exam.  Hormone studies of the blood. How is this treated? There are many treatment  choices and nearly as many questions about them. The decisions to treat or not to treat menopausal changes is an individual choice made with your health care provider. Your health care provider can discuss the treatments with you. Together, you can decide which treatment will work best for you. Your treatment choices may include:  Hormone therapy (estrogen and progesterone).  Non-hormonal medicines.  Treating the individual symptoms with medicine (for example antidepressants for depression).  Herbal medicines that may help specific symptoms.  Counseling by a psychiatrist or psychologist.  Group therapy.  Lifestyle changes including: ? Eating healthy. ? Regular exercise. ? Limiting caffeine and alcohol. ? Stress management and meditation.  No treatment.  Follow these instructions at home:  Take the medicine your health care provider gives you as directed.  Get plenty of sleep and rest.  Exercise regularly.  Eat a diet that contains calcium (good for the bones) and soy products (acts like estrogen hormone).  Avoid alcoholic beverages.  Do not smoke.  If you have hot flashes, dress in layers.  Take supplements, calcium, and vitamin D to strengthen bones.  You can use over-the-counter lubricants or moisturizers for vaginal dryness.  Group therapy is sometimes very helpful.  Acupuncture may be helpful in some cases. Contact a health care provider if:  You are not sure you are in menopause.  You are having menopausal symptoms and need advice and treatment.  You are still having menstrual periods after age 55 years.  You have pain with intercourse.  Menopause is complete (no menstrual period for 12 months) and you develop vaginal bleeding.  You need a referral to a specialist (gynecologist, psychiatrist, or psychologist) for treatment. Get help right   away if:  You have severe depression.  You have excessive vaginal bleeding.  You fell and think you have a  broken bone.  You have pain when you urinate.  You develop leg or chest pain.  You have a fast pounding heart beat (palpitations).  You have severe headaches.  You develop vision problems.  You feel a lump in your breast.  You have abdominal pain or severe indigestion. This information is not intended to replace advice given to you by your health care provider. Make sure you discuss any questions you have with your health care provider. Document Released: 08/11/2003 Document Revised: 10/27/2015 Document Reviewed: 12/18/2012 Elsevier Interactive Patient Education  2017 Elsevier Inc.  

## 2018-01-30 NOTE — Progress Notes (Signed)
Subjective:  Patient ID: Kristen Moore, female    DOB: [birth year]  Age: 47 y.o. MRN: [id number]  CC: nevus   HPI  SHERMEKA RUTT a 47 y.o.femalewith a medical history of asthma, anemia, bipolar 1 disorder, hypothyroidism, IBS, uterine fibroids, hysterosalpingo-oophorectomy, and cholecystectomypresents with skin lesion on left calf that has been present for more than 20 years. Initially cut, what was thought to be a mole, off herself. Cut the mole off more than 20 years ago and says it has grown back "funny". She has on occasional peeled some scale off the lesion but always grows back. Would like to know what the lesion is.       Pt also inquires about estrogen use for her hot flashes. Hot flashes attributed to her hysterosalpingo-oophorectomy on 12/25/12. Has used estrogen consecutively since then. Asks if she should be on estrogen for so long. She heard that she may develop breast cancer with long term estrogen use. Reports a strong family history of breast cancer. No personal history of breast cancer but says she was told her breasts were fibrous. Last mammogram 05/10/16 with no evidence of malignancy within either breast.      Outpatient Medications Prior to Visit  Medication Sig Dispense Refill  . acetaminophen (TYLENOL) 500 MG tablet Take 1 tablet (500 mg total) by mouth every 8 (eight) hours as needed. 15 tablet 0  . buPROPion (WELLBUTRIN XL) 150 MG 24 hr tablet Take 150 mg by mouth daily.    . furosemide (LASIX) 20 MG tablet Take 1 tablet (20 mg total) by mouth daily as needed. 30 tablet 3  . hydrOXYzine (ATARAX/VISTARIL) 10 MG tablet Take 10 mg by mouth 3 (three) times daily as needed.    Marland Kitchen levothyroxine (SYNTHROID, LEVOTHROID) 150 MCG tablet Take 1 tablet (150 mcg total) by mouth daily. 90 tablet 1  . lithium carbonate 300 MG capsule Take 300-600 mg by mouth 2 (two) times daily with a meal. Takes 300 mg in the morning at 0800, 300 mg at 1600, and 600 mg at bedtime    .  Multiple Vitamin (MULTIVITAMIN) capsule Take 1 capsule by mouth daily.    Marland Kitchen estradiol (ESTRACE) 2 MG tablet Take 1 tablet (2 mg total) by mouth daily. (Patient not taking: Reported on 01/30/2018) 60 tablet 1  . potassium chloride (K-DUR) 10 MEQ tablet Take 1 tablet (10 mEq total) by mouth 2 (two) times daily. (Patient not taking: Reported on 12/03/2017) 60 tablet 3  . traZODone (DESYREL) 50 MG tablet Take 50 mg by mouth at bedtime.     No facility-administered medications prior to visit.      ROS Review of Systems  Constitutional: Negative for chills, fever and malaise/fatigue.  Eyes: Negative for blurred vision.  Respiratory: Negative for shortness of breath.   Cardiovascular: Negative for chest pain and palpitations.  Gastrointestinal: Negative for abdominal pain and nausea.  Genitourinary: Negative for dysuria and hematuria.  Musculoskeletal: Negative for joint pain and myalgias.  Skin: Negative for rash.  Neurological: Negative for tingling and headaches.  Endo/Heme/Allergies:       Hot flashes  Psychiatric/Behavioral: Negative for depression. The patient is not nervous/anxious.     Objective:  BP 104/71 (BP Location: Left Arm, Patient Position: Sitting, Cuff Size: Large)   Pulse 77   Temp 98.4 F (36.9 C) (Oral)   Ht 5\' 4"  (1.626 m)   Wt 200 lb 3.2 oz (90.8 kg)   LMP 11/29/2012   SpO2 97%   BMI  34.36 kg/m   BP/Weight 01/30/2018 9/77/4142 08/11/5318  Systolic BP 233 435 686  Diastolic BP 71 77 82  Wt. (Lbs) 200.2 205.6 211.2  BMI 34.36 35.29 36.25      Physical Exam  Constitutional: She is oriented to person, place, and time.  Well developed, well nourished, NAD, polite  HENT:  Head: Normocephalic and atraumatic.  Eyes: No scleral icterus.  Neck: Normal range of motion. Neck supple. No thyromegaly present.  Cardiovascular: Normal rate, regular rhythm and normal heart sounds.  Pulmonary/Chest: Effort normal and breath sounds normal.  Abdominal: Soft. Bowel sounds  are normal. There is no tenderness.  Musculoskeletal: She exhibits no edema.  Neurological: She is alert and oriented to person, place, and time.  Skin: Skin is warm and dry. No rash noted. No erythema. No pallor.  Left calf with ovoid, flesh colored, centrally umbilicated, large papule with overlying scale. No erythema, crusting, bleeding, or suppuration.  Psychiatric: She has a normal mood and affect. Her behavior is normal. Thought content normal.  Vitals reviewed.    Assessment & Plan:    1. High risk medication use - MM 3D SCREEN BREAST BILATERAL; Future - Used estrogen HRT for many years consecutively. Reports a strong family hx of breast cancer.  - Referral to GYN  2. Skin lesion - Ambulatory referral to Dermatology  3. Hot flashes - Begin Black Cohosh 200 MG CAPS; Take 1 capsule (200 mg total) by mouth 2 (two) times daily.  Dispense: 30 capsule; Refill: 2 - Can not use Paroxetine due to current use of Wellbutrin.  - No refill of estrogen due to many consecutive years of use  - Referral to GYN.  Meds ordered this encounter  Medications  . Black Cohosh 200 MG CAPS    Sig: Take 1 capsule (200 mg total) by mouth 2 (two) times daily.    Dispense:  30 capsule    Refill:  2    Order Specific Question:   Supervising Provider    Answer:   Charlott Rakes [[id number]]    Follow-up: Return if symptoms worsen or fail to improve.   Clent Demark PA

## 2018-02-07 ENCOUNTER — Encounter: Payer: Self-pay | Admitting: Obstetrics and Gynecology

## 2018-02-10 ENCOUNTER — Telehealth (HOSPITAL_COMMUNITY): Payer: Self-pay | Admitting: *Deleted

## 2018-02-10 NOTE — Telephone Encounter (Signed)
Telephoned patient at home number and left message to return call to [facility] 

## 2018-02-11 ENCOUNTER — Telehealth (HOSPITAL_COMMUNITY): Payer: Self-pay | Admitting: *Deleted

## 2018-02-11 NOTE — Telephone Encounter (Signed)
Telephoned patient at home number and left message to return call to [facility] 

## 2018-02-12 ENCOUNTER — Other Ambulatory Visit: Payer: Self-pay | Admitting: Obstetrics and Gynecology

## 2018-02-12 DIAGNOSIS — Z1231 Encounter for screening mammogram for malignant neoplasm of breast: Secondary | ICD-10-CM

## 2018-02-28 ENCOUNTER — Other Ambulatory Visit (INDEPENDENT_AMBULATORY_CARE_PROVIDER_SITE_OTHER): Payer: Self-pay | Admitting: Physician Assistant

## 2018-02-28 ENCOUNTER — Telehealth (INDEPENDENT_AMBULATORY_CARE_PROVIDER_SITE_OTHER): Payer: Self-pay | Admitting: Physician Assistant

## 2018-02-28 DIAGNOSIS — E039 Hypothyroidism, unspecified: Secondary | ICD-10-CM

## 2018-02-28 MED ORDER — LEVOTHYROXINE SODIUM 150 MCG PO TABS: 150.0000 ug | ORAL_TABLET | Freq: Every day | ORAL | 1 refills | Status: DC

## 2018-02-28 NOTE — Telephone Encounter (Signed)
Sent refill

## 2018-02-28 NOTE — Telephone Encounter (Signed)
Patient is aware that levothyroxine refill has been sent to pharmacy. Nat Christen, CMA

## 2018-02-28 NOTE — Telephone Encounter (Signed)
FWD to PCP. Kristen Moore S Mykah Bellomo, CMA  

## 2018-02-28 NOTE — Telephone Encounter (Signed)
Patient called to request a medication refill for   levothyroxine (SYNTHROID, LEVOTHROID) 150 MCG tablet   Patient uses [facility] [place]  Please Advice [phone]  Thank you Emmit Pomfret

## 2018-03-05 ENCOUNTER — Encounter: Payer: Self-pay | Admitting: Endocrinology

## 2018-03-05 ENCOUNTER — Ambulatory Visit (INDEPENDENT_AMBULATORY_CARE_PROVIDER_SITE_OTHER): Payer: Self-pay | Admitting: Endocrinology

## 2018-03-05 VITALS — BP 114/60 | HR 65 | Ht 64.0 in | Wt 203.6 lb

## 2018-03-05 DIAGNOSIS — E237 Disorder of pituitary gland, unspecified: Secondary | ICD-10-CM

## 2018-03-05 LAB — CORTISOL: Cortisol, Plasma: 3.5 ug/dL

## 2018-03-05 NOTE — Patient Instructions (Signed)
blood tests are requested for you today.  We'll let you know about the results. Is these tests are fine, I would be happy to see you back here as needed.

## 2018-03-05 NOTE — Progress Notes (Signed)
Subjective:    Patient ID: Kristen Moore, female    DOB: Feb 18, 1971, 47 y.o.   MRN: [id number]  HPI Pt returns for f/u of elevated ACTH (dx'ed 2019; no cause was found; recheck was normal ;she took [facility] many years ago, for bodybuilding).  pt states she feels well in general, except for "hot flashes.".  She takes no hormones except for synthroid Past Medical History:  Diagnosis Date  . Abnormal Pap smear    cryo  . Anemia   . Asthma   . Bipolar 1 disorder ([place])   . Cyst of breast    left breast  . DUB (dysfunctional uterine bleeding)   . Fibroids   . Headache(784.0)   . Hypothyroidism   . IBS (irritable bowel syndrome)   . Thyroid dysfunction     Past Surgical History:  Procedure Laterality Date  . ABDOMINAL HYSTERECTOMY Bilateral 12/25/2012   Procedure: HYSTERECTOMY ABDOMINAL;  Surgeon: Emily Filbert, MD;  Location: [facility];  Service: Gynecology;  Laterality: Bilateral;  TAH and bilateral salpingectomy  . BILATERAL SALPINGECTOMY Bilateral 12/25/2012   Procedure: BILATERAL SALPINGECTOMY;  Surgeon: Emily Filbert, MD;  Location: [facility];  Service: Gynecology;  Laterality: Bilateral;  . CERVICAL BIOPSY  W/ LOOP ELECTRODE EXCISION    . CHOLECYSTECTOMY    . COLON SURGERY    . DILATION AND CURETTAGE OF UTERUS    . OOPHORECTOMY Bilateral 12/25/2012   Procedure: OOPHORECTOMY;  Surgeon: Emily Filbert, MD;  Location: [facility];  Service: Gynecology;  Laterality: Bilateral;  . TUBAL LIGATION      Social History   Socioeconomic History  . Marital status: Single    Spouse name: Not on file  . Number of children: Not on file  . Years of education: Not on file  . Highest education level: Not on file  Occupational History  . Not on file  Social Needs  . Financial resource strain: Not on file  . Food insecurity:    Worry: Not on file    Inability: Not on file  . Transportation needs:    Medical: Not on file    Non-medical: Not on file  Tobacco Use  . Smoking status: Current Every Day  Smoker    Packs/day: 0.50    Years: 9.00    Pack years: 4.50    Types: Cigarettes  . Smokeless tobacco: Never Used  Substance and Sexual Activity  . Alcohol use: No    Alcohol/week: 0.0 standard drinks  . Drug use: No  . Sexual activity: Yes    Birth control/protection: Surgical  Lifestyle  . Physical activity:    Days per week: Not on file    Minutes per session: Not on file  . Stress: Not on file  Relationships  . Social connections:    Talks on phone: Not on file    Gets together: Not on file    Attends religious service: Not on file    Active member of club or organization: Not on file    Attends meetings of clubs or organizations: Not on file    Relationship status: Not on file  . Intimate partner violence:    Fear of current or ex partner: Not on file    Emotionally abused: Not on file    Physically abused: Not on file    Forced sexual activity: Not on file  Other Topics Concern  . Not on file  Social History Narrative  . Not on file  Current Outpatient Medications on File Prior to Visit  Medication Sig Dispense Refill  . acetaminophen (TYLENOL) 500 MG tablet Take 1 tablet (500 mg total) by mouth every 8 (eight) hours as needed. 15 tablet 0  . BLACK COHOSH PO Take 500 mg by mouth daily.    Marland Kitchen buPROPion (WELLBUTRIN XL) 300 MG 24 hr tablet Take 300 mg by mouth daily.    . furosemide (LASIX) 20 MG tablet Take 1 tablet (20 mg total) by mouth daily as needed. 30 tablet 3  . hydrOXYzine (ATARAX/VISTARIL) 10 MG tablet Take 10 mg by mouth 3 (three) times daily as needed.    Marland Kitchen levothyroxine (SYNTHROID, LEVOTHROID) 150 MCG tablet Take 1 tablet (150 mcg total) by mouth daily. 90 tablet 1  . lithium carbonate 300 MG capsule Take 300-600 mg by mouth 2 (two) times daily with a meal. Takes 300 mg in the morning at 0800, 300 mg at 1600, and 600 mg at bedtime    . Multiple Vitamin (MULTIVITAMIN) capsule Take 1 capsule by mouth daily.    . potassium chloride (K-DUR) 10 MEQ tablet  Take 1 tablet (10 mEq total) by mouth 2 (two) times daily. 60 tablet 3   No current facility-administered medications on file prior to visit.      Family History  Problem Relation Age of Onset  . Heart disease Father   . Cancer Father 2       lung  . Hypertension Mother   . Rheum arthritis Mother   . Fibromyalgia Mother   . COPD Mother   . Sleep apnea Mother   . Breast cancer Mother   . Breast cancer Maternal Grandmother   . Breast cancer Paternal Aunt   . Breast cancer Maternal Aunt     BP 114/60 (BP Location: Right Arm, Patient Position: Sitting)   Pulse 65   Ht 5\' 4"  (1.626 m)   Wt 203 lb 9.6 oz (92.4 kg)   LMP 11/29/2012   SpO2 96%   BMI 34.95 kg/m   Review of Systems No weight change.      Objective:   Physical Exam VITAL SIGNS:  See vs page GENERAL: no distress NECK: There is no palpable thyroid enlargement.  No thyroid nodule is palpable.  No palpable lymphadenopathy at the anterior neck.    Lab Results  Component Value Date   TSH 2.990 11/14/2017   T4TOTAL 11.4 11/14/2017      Assessment & Plan:  elev ACTH: uncertain etiology.  recheck today Hot flashes.  uncertain etiology.  Not thyroid-related  Patient Instructions  blood tests are requested for you today.  We'll let you know about the results. Is these tests are fine, I would be happy to see you back here as needed.

## 2018-03-07 LAB — ACTH: C206 ACTH: 16 pg/mL (ref 6–50)

## 2018-03-12 ENCOUNTER — Encounter: Payer: Self-pay | Admitting: Obstetrics and Gynecology

## 2018-03-12 ENCOUNTER — Telehealth: Payer: Self-pay | Admitting: Endocrinology

## 2018-03-12 NOTE — Telephone Encounter (Signed)
Patient states they would like the results from there labs.

## 2018-03-12 NOTE — Telephone Encounter (Signed)
Pt aware of results 

## 2018-03-13 ENCOUNTER — Encounter: Payer: Self-pay | Admitting: Obstetrics and Gynecology

## 2018-03-13 NOTE — Progress Notes (Signed)
Patient did not keep her GYN appointment for 03/12/2018.  Durene Romans MD Attending Center for [employer] farm manager)

## 2018-04-16 ENCOUNTER — Other Ambulatory Visit (HOSPITAL_COMMUNITY): Payer: Self-pay | Admitting: *Deleted

## 2018-04-16 DIAGNOSIS — Z0000 Encounter for general adult medical examination without abnormal findings: Secondary | ICD-10-CM

## 2018-04-17 ENCOUNTER — Ambulatory Visit: Payer: Self-pay

## 2018-04-17 ENCOUNTER — Ambulatory Visit (HOSPITAL_COMMUNITY)
Admission: RE | Admit: 2018-04-17 | Discharge: 2018-04-17 | Disposition: A | Discharge status: Home / Self Care | Payer: Self-pay | Source: Ambulatory Visit | Attending: Obstetrics and Gynecology | Admitting: Obstetrics and Gynecology

## 2018-04-17 ENCOUNTER — Encounter (HOSPITAL_COMMUNITY): Payer: Self-pay

## 2018-04-17 ENCOUNTER — Other Ambulatory Visit (HOSPITAL_COMMUNITY): Payer: Self-pay | Admitting: *Deleted

## 2018-04-17 VITALS — BP 128/84 | Wt 203.0 lb

## 2018-04-17 DIAGNOSIS — N644 Mastodynia: Secondary | ICD-10-CM

## 2018-04-17 DIAGNOSIS — Z1239 Encounter for other screening for malignant neoplasm of breast: Secondary | ICD-10-CM

## 2018-04-17 NOTE — Patient Instructions (Addendum)
Explained breast self awareness with [name]. Patient did not need a Pap smear today due to her history of a hysterectomy for benign reasons. Let patient know that she does not need any further Pap smears due to her history of a hysterectomy for benign reasons. Referred patient to the [facility] for diagnostic mammogram. Appointment scheduled for Tuesday, April 22, 2018 at 1310. Patient aware of appointment and will be there. Discussed smoking cessation with patient. Referred patient to the [facility] Quitline and gave resources to free smoking cessation classes at [facility]. Kristen Moore verbalized understanding.  Haidyn Kilburg, Arvil Chaco, RN 1:53 PM

## 2018-04-17 NOTE — Progress Notes (Signed)
Complaints of left breast lump x one week that is painful. Patient states the pain is constant. Patient rates the pain at a 7-8 out of 10.  Pap Smear: Pap smear not completed today. Last Pap smear was 06/06/2011 at [facility] and normal. Per patient has a history of an abnormal Pap smear in 1992 that required a LEEP for follow-up. Per patient all Pap smears have been normal since LEEP. Patient has a history of a hysterectomy 12/25/2012 due to fibroids. Patient no longer needs Pap smears due to her history of a hysterectomy for benign reasons per [facility] and ACOG guidelines. Last Pap smear result is in EPIC.  Physical exam: Breasts Breasts symmetrical. No skin abnormalities bilateral breasts. No nipple retraction bilateral breasts. No nipple discharge bilateral breasts. No lymphadenopathy. No lumps palpated bilateral breasts. Unable to palpate a lump in patients area of concern within the left breast. Complaints of left outer and upper breast pain on exam. Referred patient to the [facility] for diagnostic mammogram. Appointment scheduled for Tuesday, April 22, 2018 at 1310.         Pelvic/Bimanual No Pap smear completed today since patient has a history of a hysterectomy for benign reasons. Pap smear not indicated per [facility] guidelines.  Smoking History: Patient is a current smoker. Discussed smoking cessation with patient. Referred patient to the [facility] Quitline and gave resources to free smoking cessation classes at [facility].  Patient Navigation: Patient education provided. Access to services provided for patient through [facility] program.   Breast and Cervical Cancer Risk Assessment: Patient has a family history of her maternal grandmother and a paternal aunt having breast cancer. Patient has no known genetic mutations or history of radiation treatment to the chest before age 80. Per patient has a history of cervical dysplasia. Patient has no history of being immunocompromised  or DES exposure in-utero.  Risk Assessment    Risk Scores      04/17/2018   Last edited by: Armond Hang, LPN   5-year risk: 0.6 %   Lifetime risk: 7.7 %

## 2018-04-18 ENCOUNTER — Inpatient Hospital Stay: Payer: Self-pay

## 2018-04-18 ENCOUNTER — Inpatient Hospital Stay: Payer: Self-pay | Attending: Obstetrics and Gynecology | Admitting: *Deleted

## 2018-04-18 VITALS — BP 130/70 | Ht 64.0 in | Wt 204.0 lb

## 2018-04-18 DIAGNOSIS — Z0000 Encounter for general adult medical examination without abnormal findings: Secondary | ICD-10-CM

## 2018-04-18 LAB — LIPID PANEL
Cholesterol: 224 mg/dL — ABNORMAL HIGH (ref 0–200)
HDL: 44 mg/dL (ref >40)
LDL Cholesterol: 124 mg/dL — ABNORMAL HIGH (ref 0–99)
Total CHOL/HDL Ratio: 5.1 RATIO
Triglycerides: 280 mg/dL — ABNORMAL HIGH (ref <150)
VLDL: 56 mg/dL — ABNORMAL HIGH (ref 0–40)

## 2018-04-18 LAB — HEMOGLOBIN A1C
Hgb A1c MFr Bld: 4.7 % — ABNORMAL LOW (ref 4.8–5.6)
Mean Plasma Glucose: 88.19 mg/dL

## 2018-04-18 NOTE — Addendum Note (Signed)
Addended by: Herbert Spires D on: 04/18/2018 10:53 AM   Modules accepted: Orders

## 2018-04-18 NOTE — Progress Notes (Signed)
[facility] initial screening  Clinical Measurement:  Height: 64in Weight: 204lb  Blood Pressure:  138/72  Blood Pressure #2: 130/70   Fasting Labs Drawn Today, will review with patient when they result.  Medical History:  Patient states that she has been diagnosed with high cholesterol.  Patient states that she has not been diagnosed with  high blood pressure, diabetes or heart disease.  Medications:  Patients states she is not taking any medications for high cholesterol, high blood pressure or diabetes.  She is not taking aspirin daily to prevent heart attack or stroke.   Blood pressure, self measurement:  Patients states she does not measure blood pressure at home.     Nutrition:  Patient states she eats 1 cup of fruit and 0 cups of vegetables in an average day.  Patient states she does eat fish regularly, she eats less than half a serving of whole grains daily. She drinks more than 36 ounces of beverages with added sugar weekly.  She is currently watching her sodium intake.  She has not had any drinks containing alcohol in the last seven days.    Physical activity:  Patient states that she gets 210 minutes of moderate exercise in a week.  She gets 0 minutes of vigorous exercise per week.    Smoking status:  Patient states she is a current smoker and has received literature on smoking cessation at [facility] clinic.  Quality of life:  Patient states that she has had 0 bad physical days out of the last 30 days. In the last 2 weeks, she has had several  days that she has felt down or depressed. She has had several days in the last 2 weeks that she has had little interest or pleasure in doing things.   Risk reduction and counseling:  Patient states she wants to lose weight and increase fruit and vegetable intake.  I encouraged her to continue with current exercise regimen and increase vegetable and fruit intake.   Navigation:  I will notify patient of lab results.  Patient is aware of 2 more health  coaching sessions and a follow up.

## 2018-04-21 ENCOUNTER — Telehealth (HOSPITAL_COMMUNITY): Payer: Self-pay | Admitting: *Deleted

## 2018-04-21 NOTE — Telephone Encounter (Signed)
Health coaching 2  Labs- cholesterol 224, triglycerides 280, LDL cholesterol 124, HDL cholesterol 44, hemoglobin a1c  4.7, mean plasma glucose 88.19  Patient is aware and understands these results.  Goals- Patient states that she only eats 1 serving of fruit daily and drinks sodas daily.I encouraged patient to eat 5 or more servings of fruits and vegetables and drink 8 to 10 glasses of water daily.  Navigation:  Patient is aware of 1 more health coaching sessions and a follow up. Patient is with [facility], Dr. Altamease Oiler.  Time- 10 minutes

## 2018-04-22 ENCOUNTER — Ambulatory Visit
Admission: RE | Admit: 2018-04-22 | Discharge: 2018-04-22 | Disposition: A | Discharge status: Home / Self Care | Payer: No Typology Code available for payment source | Source: Ambulatory Visit | Attending: Obstetrics and Gynecology | Admitting: Obstetrics and Gynecology

## 2018-04-22 DIAGNOSIS — N644 Mastodynia: Secondary | ICD-10-CM

## 2018-04-30 ENCOUNTER — Encounter (INDEPENDENT_AMBULATORY_CARE_PROVIDER_SITE_OTHER): Payer: Self-pay | Admitting: Physician Assistant

## 2018-04-30 ENCOUNTER — Other Ambulatory Visit: Payer: Self-pay

## 2018-04-30 ENCOUNTER — Ambulatory Visit (INDEPENDENT_AMBULATORY_CARE_PROVIDER_SITE_OTHER): Payer: Self-pay | Admitting: Physician Assistant

## 2018-04-30 ENCOUNTER — Encounter (HOSPITAL_COMMUNITY): Payer: Self-pay | Admitting: *Deleted

## 2018-04-30 VITALS — BP 123/83 | HR 60 | Temp 97.6°F | Ht 64.0 in | Wt 199.4 lb

## 2018-04-30 DIAGNOSIS — M545 Low back pain, unspecified: Secondary | ICD-10-CM

## 2018-04-30 DIAGNOSIS — R300 Dysuria: Secondary | ICD-10-CM

## 2018-04-30 DIAGNOSIS — R600 Localized edema: Secondary | ICD-10-CM

## 2018-04-30 DIAGNOSIS — Z79899 Other long term (current) drug therapy: Secondary | ICD-10-CM

## 2018-04-30 DIAGNOSIS — E7841 Elevated Lipoprotein(a): Secondary | ICD-10-CM

## 2018-04-30 DIAGNOSIS — R000 Tachycardia, unspecified: Secondary | ICD-10-CM

## 2018-04-30 LAB — POCT URINALYSIS DIPSTICK
Bilirubin, UA: NEGATIVE
Blood, UA: NEGATIVE
Glucose, UA: NEGATIVE
Ketones, UA: NEGATIVE
Leukocytes, UA: NEGATIVE
Nitrite, UA: NEGATIVE
Protein, UA: NEGATIVE
Spec Grav, UA: 1.02 (ref 1.010–1.025)
Urobilinogen, UA: 0.2 E.U./dL
pH, UA: 7 (ref 5.0–8.0)

## 2018-04-30 MED ORDER — ATORVASTATIN CALCIUM 40 MG PO TABS: 40.0000 mg | ORAL_TABLET | Freq: Every day | ORAL | 1 refills | Status: DC

## 2018-04-30 NOTE — Patient Instructions (Signed)
Lithium tablets or capsules What is this medicine? LITHIUM (LITH ee um) is used to prevent and treat the manic episodes caused by manic-depressive illness. This medicine may be used for other purposes; ask your health care provider or pharmacist if you have questions. COMMON BRAND NAME(S): Eskalith What should I tell my health care provider before I take this medicine? They need to know if you have any of these conditions: -active infection -Brugada Syndrome -dehydration (diarrhea or sweating) -diet low in salt -heart disease -high levels of calcium in the blood -history of irregular heartbeat -kidney disease -low level of potassium or sodium in the blood -parathyroid disease -problems urinating -thyroid disease -an unusual or allergic reaction to lithium, other medicines, foods, dyes, or preservatives -pregnant or trying to get pregnant -breast-feeding How should I use this medicine? Take this medicine by mouth with a glass of water. Follow the directions on the prescription label. Take after a meal or snack to avoid stomach upset. Take your doses at regular intervals. Do not take your medicine more often than directed. The amount of this medicine you take is very important. Taking more than the prescribed dose can cause serious side effects. Do not stop taking except on the advice of your doctor or health care professional. Talk to your pediatrician regarding the use of this medicine in children. Special care may be needed. While this drug may be prescribed for children as young as 12 years for selected conditions, precautions do apply. Overdosage: If you think you have taken too much of this medicine contact a poison control center or emergency room at once. NOTE: This medicine is only for you. Do not share this medicine with others. What if I miss a dose? If you miss a dose, take it as soon as you can. If it is almost time for your next dose, take only that dose. Do not take double or  extra doses. What may interact with this medicine? Do not take this medicine with any of the following medications: -cisapride -dofetilide -dronedarone -pimozide -stimulant medicines used to treat ADHD or narcolepsy -thioridazine -ziprasidone This medicine may also interact with the following medications: -buspirone -caffeine -calcium iodide -carbamazepine -certain medicines for depression, anxiety, or psychotic disturbances -certain medicines for migraine headache like almotriptan, eletriptan, frovatriptan, naratriptan, rizatriptan, sumatriptan, zolmitriptan -diuretics -fentanyl -linezolid -MAOIs like Carbex, Eldepryl, Marplan, Nardil, and Parnate -medicines for high blood pressure -medicines that relax muscles for surgery -metronidazole -NSAIDs, medicines for pain and inflammation, like ibuprofen or naproxen -other medicines that prolong the QT interval (cause an abnormal heart rhythm) -phenytoin -potassium iodide, KI -sodium bicarbonate -sodium chloride -St. John's Wort -tramadol -tryptophan -urea This list may not describe all possible interactions. Give your health care provider a list of all the medicines, herbs, non-prescription drugs, or dietary supplements you use. Also tell them if you smoke, drink alcohol, or use illegal drugs. Some items may interact with your medicine. What should I watch for while using this medicine? Visit your doctor or health care professional for regular checks on your progress. It can take several weeks of treatment before you start to get better. The amount of salt (sodium) in your body influences the effects of this medicine, and this medicine can increase salt loss from the body. Eat a normal diet that includes salt. Do not change to salt substitutes. Avoid changes involving diet, or medications that include large amounts of sodium like sodium bicarbonate. Ask your doctor or health care professional for advice if you are not  sure. Drink  plenty of fluids while you are taking this medicine. Avoid drinks that contain caffeine, such as coffee, tea and colas. You will need extra fluids if you have diarrhea or sweat a lot. This will help prevent toxic effects from this medicine. Be careful not to get overheated during exercise, saunas, hot baths, and hot weather. Consult your doctor or health care professional if you have a high fever or persistent diarrhea. You may get drowsy or dizzy. Do not drive, use machinery, or do anything that needs mental alertness until you know how this medicine affects you. Do not stand or sit up quickly, especially if you are an older patient. This reduces the risk of dizzy or fainting spells. What side effects may I notice from receiving this medicine? Side effects that you should report to your doctor or health care professional as soon as possible: -allergic reactions like skin rash, itching or hives, swelling of the face, lips, or tongue -blurred vision -breathing problems -clumsiness or loss of balance -confusion -difficulty speaking or swallowing -dizziness -feeling faint or lightheaded, falls -increased thirst -increased urination -loss of appetite -muscle weakness -nausea, vomiting -pain, coldness, or blue coloration of fingers or toes -sensitivity to cold -seizures -slow, fast, or irregular heartbeat (palpitations) -slurred speech -swelling in the neck -unusually weak or tired Side effects that usually do not require medical attention (report to your doctor or health care professional if they continue or are bothersome): -acne -diarrhea -mild tremor -stomach pain -weight gain This list may not describe all possible side effects. Call your doctor for medical advice about side effects. You may report side effects to FDA at 1-800-FDA-1088. Where should I keep my medicine? Keep out of the reach of children. Store at room temperature between 15 and 30 degrees C (59 and 86 degrees F).  Throw away any unused medicine after the expiration date. NOTE: This sheet is a summary. It may not cover all possible information. If you have questions about this medicine, talk to your doctor, pharmacist, or health care provider.  2018 Elsevier/Gold Standard (2015-05-20 17:31:38)

## 2018-04-30 NOTE — Progress Notes (Signed)
Subjective:  Patient ID: Kristen Moore, female    DOB: 02-Jul-1970  Age: 47 y.o. MRN: [id number]  CC:   HPI Kristen Moore a 47 y.o.femalewith a medical history of asthma, anemia, bipolar 1 disorder, hypothyroidism, IBS, uterine fibroids, hysterosalpingo-oophorectomy, and cholecystectomypresents to f/u on LE edema. Says her LE edema has resolved. Uses furosemide infrequently because she does not have swelling of LEs often. Endorses occasional tachycardia. Is seeing endocrinologist in regards to irregular ACTH but has not discussed hypothyroidism. Last thyroid panel five months ago was stable. Pt continues to take Synthroid 150 mcg. Recent ACTH and cortisol levels normal. Of note, patient is taking Lithium. Says she has been taking Lithium since 2012. Sees psychiatrist at [facility] but says he does not monitor her blood. Lastly, pt endorse "a little" dysuria and left lower back pain. Thinks back pain attributed to work. Does not endorse any other symptoms.    Outpatient Medications Prior to Visit  Medication Sig Dispense Refill  . acetaminophen (TYLENOL) 500 MG tablet Take 1 tablet (500 mg total) by mouth every 8 (eight) hours as needed. 15 tablet 0  . BLACK COHOSH PO Take 500 mg by mouth daily.    Marland Kitchen buPROPion (WELLBUTRIN XL) 300 MG 24 hr tablet Take 300 mg by mouth daily.    . hydrOXYzine (ATARAX/VISTARIL) 10 MG tablet Take 10 mg by mouth 3 (three) times daily as needed.    Marland Kitchen levothyroxine (SYNTHROID, LEVOTHROID) 150 MCG tablet Take 1 tablet (150 mcg total) by mouth daily. 90 tablet 1  . lithium carbonate 300 MG capsule Take 300-600 mg by mouth 2 (two) times daily with a meal. Takes 300 mg in the morning at 0800, 300 mg at 1600, and 600 mg at bedtime    . Multiple Vitamin (MULTIVITAMIN) capsule Take 1 capsule by mouth daily.    . furosemide (LASIX) 20 MG tablet Take 1 tablet (20 mg total) by mouth daily as needed. (Patient not taking: Reported on 04/17/2018) 30 tablet 3  .  potassium chloride (K-DUR) 10 MEQ tablet Take 1 tablet (10 mEq total) by mouth 2 (two) times daily. (Patient not taking: Reported on 04/17/2018) 60 tablet 3   No facility-administered medications prior to visit.      ROS Review of Systems  Constitutional: Negative for chills, fever and malaise/fatigue.  Eyes: Negative for blurred vision.  Respiratory: Negative for shortness of breath.   Cardiovascular: Negative for chest pain, palpitations and leg swelling.  Gastrointestinal: Negative for abdominal pain and nausea.  Genitourinary: Positive for dysuria. Negative for hematuria.  Musculoskeletal: Positive for back pain. Negative for joint pain and myalgias.  Skin: Negative for rash.  Neurological: Negative for tingling and headaches.  Psychiatric/Behavioral: Negative for depression. The patient is not nervous/anxious.     Objective:  BP 123/83 (BP Location: Left Arm, Patient Position: Sitting, Cuff Size: Large)   Pulse 60   Temp 97.6 F (36.4 C) (Oral)   Ht 5\' 4"  (1.626 m)   Wt 199 lb 6.4 oz (90.4 kg)   LMP 11/29/2012   SpO2 96%   BMI 34.23 kg/m   BP/Weight 04/30/2018 04/18/2018 40/01/6760  Systolic BP 950 932 671  Diastolic BP 83 70 84  Wt. (Lbs) 199.4 204 203  BMI 34.23 35.02 34.84      Physical Exam  Constitutional: She is oriented to person, place, and time.  Well developed, well nourished, NAD, polite  HENT:  Head: Normocephalic and atraumatic.  Eyes: No scleral icterus.  Neck: Normal range  of motion. Neck supple. No thyromegaly present.  Cardiovascular: Normal rate, regular rhythm and normal heart sounds.  Pulmonary/Chest: Effort normal and breath sounds normal.  Abdominal: Soft. Bowel sounds are normal. There is no tenderness.  Musculoskeletal: She exhibits no edema.  Neurological: She is alert and oriented to person, place, and time.  Skin: Skin is warm and dry. No rash noted. No erythema. No pallor.  Psychiatric: She has a normal mood and affect. Her  behavior is normal. Thought content normal.  Vitals reviewed.    Assessment & Plan:   1. Elevated lipoprotein(a) - Begin atorvastatin (LIPITOR) 40 MG tablet; Take 1 tablet (40 mg total) by mouth daily.  Dispense: 90 tablet; Refill: 1  2. Lower extremity edema - Resolved  3. High risk medication use - Basic Metabolic Panel - TSH - Lithium level  4. Dysuria - Urinalysis Dipstick  5. Acute left-sided low back pain without sciatica - Urinalysis Dipstick   Meds ordered this encounter  Medications  . atorvastatin (LIPITOR) 40 MG tablet    Sig: Take 1 tablet (40 mg total) by mouth daily.    Dispense:  90 tablet    Refill:  1    Order Specific Question:   Supervising Provider    Answer:   Charlott Rakes [[id number]]    Follow-up: Return in about 6 months (around 10/29/2018) for HLD.   Clent Demark PA

## 2018-05-01 LAB — LITHIUM LEVEL: Lithium Lvl: 0.3 mmol/L — ABNORMAL LOW (ref 0.6–1.2)

## 2018-05-01 LAB — BASIC METABOLIC PANEL
BUN/Creatinine Ratio: 16 (ref 9–23)
BUN: 14 mg/dL (ref 6–24)
CO2: 23 mmol/L (ref 20–29)
Calcium: 9.9 mg/dL (ref 8.7–10.2)
Chloride: 105 mmol/L (ref 96–106)
Creatinine, Ser: 0.86 mg/dL (ref 0.57–1.00)
GFR calc Af Amer: 94 mL/min/{1.73_m2} (ref >59)
GFR calc non Af Amer: 82 mL/min/{1.73_m2} (ref >59)
Glucose: 92 mg/dL (ref 65–99)
Potassium: 5 mmol/L (ref 3.5–5.2)
Sodium: 141 mmol/L (ref 134–144)

## 2018-05-01 LAB — TSH: TSH: 0.178 u[IU]/mL — ABNORMAL LOW (ref 0.450–4.500)

## 2018-05-06 ENCOUNTER — Telehealth (INDEPENDENT_AMBULATORY_CARE_PROVIDER_SITE_OTHER): Payer: Self-pay

## 2018-05-06 NOTE — Telephone Encounter (Signed)
Patient is aware that there is a mild abnormality in her thyroid stimulating hormone. Advised patient to speak with psychiatrist that prescribes her lithium and ask for an alternative. After switching lithium to alternative have thyroid retested after taking medication for 8 weeks. No sign of lithium toxicity. Nat Christen, CMA

## 2018-05-06 NOTE — Telephone Encounter (Signed)
-----   Message from Clent Demark, PA-C sent at 05/06/2018 11:39 AM EST ----- There is mild thyroid stimulating hormone abnormality. I would recommend she speak to her psychiatrist that is prescribing her lithium and ask for alternative. She should get thyroid retested after switching her lithium to an alternative. Recheck thyroid after taking alternative medication for 8 weeks. Also, there is no sign of lithium toxicity.

## 2018-05-07 ENCOUNTER — Telehealth: Payer: Self-pay

## 2018-05-07 NOTE — Telephone Encounter (Signed)
I spoke with Kristen Moore and let her know that I'm calling regarding her 3rd health coaching session with [place].  She said she was ok with conducting the session now.  A little over 5 minutes into the session, I heard her give someone else an answer to a question and asked her if she was ok to continue the health coaching session.  She said that it would be better for me to call her back in the morning.

## 2018-05-08 ENCOUNTER — Telehealth (INDEPENDENT_AMBULATORY_CARE_PROVIDER_SITE_OTHER): Payer: Self-pay | Admitting: Physician Assistant

## 2018-05-08 NOTE — Telephone Encounter (Signed)
FWD to PCP. Kristen Moore S Kristen Moore, CMA  

## 2018-05-08 NOTE — Telephone Encounter (Signed)
Pt already referred to endocrinology. She will discuss further evaluation and treatment with them.

## 2018-05-08 NOTE — Telephone Encounter (Signed)
Patient called to inform that she spoke with her Psychiatrist and told him that she wanted to be off of Lithium and wanted to try a different medication due to her " thyroid being messed up ". Psychiatrist advise that he would not take her off of Lithium and is going to prescribe a different RX as well. Patient also stated that he increased her Hydroxyzine to 4 times a day. Psychiatrist told her that she was taking her medication wrong since patient was taking Hydroxyzine 1 pill in the morning and 2 at night. He advice to only take it when needed.  Patient would also like to know what to do with the abnormality in her thyroid stimulating hormone.  Please Advise [phone]  Thank you Emmit Pomfret

## 2018-05-26 ENCOUNTER — Ambulatory Visit (INDEPENDENT_AMBULATORY_CARE_PROVIDER_SITE_OTHER): Payer: No Typology Code available for payment source | Admitting: Nurse Practitioner

## 2018-05-29 ENCOUNTER — Other Ambulatory Visit: Payer: Self-pay

## 2018-05-29 ENCOUNTER — Ambulatory Visit (INDEPENDENT_AMBULATORY_CARE_PROVIDER_SITE_OTHER): Payer: Self-pay | Admitting: Physician Assistant

## 2018-05-29 ENCOUNTER — Encounter (INDEPENDENT_AMBULATORY_CARE_PROVIDER_SITE_OTHER): Payer: Self-pay | Admitting: Physician Assistant

## 2018-05-29 VITALS — BP 137/89 | HR 64 | Temp 97.3°F | Ht 64.0 in | Wt 202.4 lb

## 2018-05-29 DIAGNOSIS — R110 Nausea: Secondary | ICD-10-CM

## 2018-05-29 DIAGNOSIS — R5381 Other malaise: Secondary | ICD-10-CM

## 2018-05-29 DIAGNOSIS — R42 Dizziness and giddiness: Secondary | ICD-10-CM

## 2018-05-29 DIAGNOSIS — F319 Bipolar disorder, unspecified: Secondary | ICD-10-CM

## 2018-05-29 DIAGNOSIS — R5383 Other fatigue: Secondary | ICD-10-CM

## 2018-05-29 LAB — POCT URINALYSIS DIPSTICK
Bilirubin, UA: NEGATIVE
Blood, UA: NEGATIVE
Glucose, UA: NEGATIVE
Ketones, UA: NEGATIVE
Leukocytes, UA: NEGATIVE
Nitrite, UA: NEGATIVE
Protein, UA: NEGATIVE
Spec Grav, UA: 1.015 (ref 1.010–1.025)
Urobilinogen, UA: 0.2 E.U./dL
pH, UA: 7 (ref 5.0–8.0)

## 2018-05-29 MED ORDER — GUAIFENESIN ER 1200 MG PO TB12: 1.0000 | ORAL_TABLET | Freq: Two times a day (BID) | ORAL | 0 refills | Status: AC

## 2018-05-29 MED ORDER — NAPROXEN 500 MG PO TABS: 500.0000 mg | ORAL_TABLET | Freq: Two times a day (BID) | ORAL | 0 refills | Status: DC
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
## 2018-05-29 NOTE — Patient Instructions (Signed)
Viral Illness, Adult °Viruses are tiny germs that can get into a person's body and cause illness. There are many different types of viruses, and they cause many types of illness. Viral illnesses can range from mild to severe. They can affect various parts of the body. °Common illnesses that are caused by a virus include colds and the flu. Viral illnesses also include serious conditions such as HIV/AIDS (human immunodeficiency virus/acquired immunodeficiency syndrome). A few viruses have been linked to certain cancers. °What are the causes? °Many types of viruses can cause illness. Viruses invade cells in your body, multiply, and cause the infected cells to malfunction or die. When the cell dies, it releases more of the virus. When this happens, you develop symptoms of the illness, and the virus continues to spread to other cells. If the virus takes over the function of the cell, it can cause the cell to divide and grow out of control, as is the case when a virus causes cancer. °Different viruses get into the body in different ways. You can get a virus by: °· Swallowing food or water that is contaminated with the virus. °· Breathing in droplets that have been coughed or sneezed into the air by an infected person. °· Touching a surface that has been contaminated with the virus and then touching your eyes, nose, or mouth. °· Being bitten by an insect or animal that carries the virus. °· Having sexual contact with a person who is infected with the virus. °· Being exposed to blood or fluids that contain the virus, either through an open cut or during a transfusion. °If a virus enters your body, your body's defense system (immune system) will try to fight the virus. You may be at higher risk for a viral illness if your immune system is weak. °What are the signs or symptoms? °Symptoms vary depending on the type of virus and the location of the cells that it invades. Common symptoms of the main types of viral illnesses  include: °Cold and flu viruses °· Fever. °· Headache. °· Sore throat. °· Muscle aches. °· Nasal congestion. °· Cough. °Digestive system (gastrointestinal) viruses °· Fever. °· Abdominal pain. °· Nausea. °· Diarrhea. °Liver viruses (hepatitis) °· Loss of appetite. °· Tiredness. °· Yellowing of the skin (jaundice). °Brain and spinal cord viruses °· Fever. °· Headache. °· Stiff neck. °· Nausea and vomiting. °· Confusion or sleepiness. °Skin viruses °· Warts. °· Itching. °· Rash. °Sexually transmitted viruses °· Discharge. °· Swelling. °· Redness. °· Rash. °How is this treated? °Viruses can be difficult to treat because they live within cells. Antibiotic medicines do not treat viruses because these drugs do not get inside cells. Treatment for a viral illness may include: °· Resting and drinking plenty of fluids. °· Medicines to relieve symptoms. These can include over-the-counter medicine for pain and fever, medicines for cough or congestion, and medicines to relieve diarrhea. °· Antiviral medicines. These drugs are available only for certain types of viruses. They may help reduce flu symptoms if taken early. There are also many antiviral medicines for hepatitis and HIV/AIDS. °Some viral illnesses can be prevented with vaccinations. A common example is the flu shot. °Follow these instructions at home: °Medicines ° °· Take over-the-counter and prescription medicines only as told by your health care provider. °· If you were prescribed an antiviral medicine, take it as told by your health care provider. Do not stop taking the medicine even if you start to feel better. °· Be aware of when   antibiotics are needed and when they are not needed. Antibiotics do not treat viruses. If your health care provider thinks that you may have a bacterial infection as well as a viral infection, you may get an antibiotic. °? Do not ask for an antibiotic prescription if you have been diagnosed with a viral illness. That will not make your  illness go away faster. °? Frequently taking antibiotics when they are not needed can lead to antibiotic resistance. When this develops, the medicine no longer works against the bacteria that it normally fights. °General instructions °· Drink enough fluids to keep your urine clear or pale yellow. °· Rest as much as possible. °· Return to your normal activities as told by your health care provider. Ask your health care provider what activities are safe for you. °· Keep all follow-up visits as told by your health care provider. This is important. °How is this prevented? °Take these actions to reduce your risk of viral infection: °· Eat a healthy diet and get enough rest. °· Wash your hands often with soap and water. This is especially important when you are in public places. If soap and water are not available, use hand sanitizer. °· Avoid close contact with friends and family who have a viral illness. °· If you travel to areas where viral gastrointestinal infection is common, avoid drinking water or eating raw food. °· Keep your immunizations up to date. Get a flu shot every year as told by your health care provider. °· Do not share toothbrushes, nail clippers, razors, or needles with other people. °· Always practice safe sex. ° °Contact a health care provider if: °· You have symptoms of a viral illness that do not go away. °· Your symptoms come back after going away. °· Your symptoms get worse. °Get help right away if: °· You have trouble breathing. °· You have a severe headache or a stiff neck. °· You have severe vomiting or abdominal pain. °This information is not intended to replace advice given to you by your health care provider. Make sure you discuss any questions you have with your health care provider. °Document Released: 09/30/2015 Document Revised: 11/02/2015 Document Reviewed: 09/30/2015 °Elsevier Interactive Patient Education © 2019 [employer] ° °

## 2018-05-30 ENCOUNTER — Telehealth (INDEPENDENT_AMBULATORY_CARE_PROVIDER_SITE_OTHER): Payer: Self-pay

## 2018-05-30 ENCOUNTER — Telehealth: Payer: Self-pay | Admitting: Licensed Clinical Social Worker

## 2018-05-30 LAB — CBC WITH DIFFERENTIAL/PLATELET
Basophils Absolute: 0.1 10*3/uL (ref 0.0–0.2)
Basos: 1 %
EOS (ABSOLUTE): 0.2 10*3/uL (ref 0.0–0.4)
Eos: 2 %
Hematocrit: 45 % (ref 34.0–46.6)
Hemoglobin: 15.5 g/dL (ref 11.1–15.9)
Immature Grans (Abs): 0 10*3/uL (ref 0.0–0.1)
Immature Granulocytes: 0 %
Lymphocytes Absolute: 1.8 10*3/uL (ref 0.7–3.1)
Lymphs: 26 %
MCH: 32.2 pg (ref 26.6–33.0)
MCHC: 34.4 g/dL (ref 31.5–35.7)
MCV: 93 fL (ref 79–97)
Monocytes Absolute: 0.4 10*3/uL (ref 0.1–0.9)
Monocytes: 5 %
Neutrophils Absolute: 4.6 10*3/uL (ref 1.4–7.0)
Neutrophils: 66 %
Platelets: 228 10*3/uL (ref 150–450)
RBC: 4.82 x10E6/uL (ref 3.77–5.28)
RDW: 12.6 % (ref 12.3–15.4)
WBC: 7 10*3/uL (ref 3.4–10.8)

## 2018-05-30 LAB — COMPREHENSIVE METABOLIC PANEL
ALT: 24 IU/L (ref 0–32)
AST: 17 IU/L (ref 0–40)
Albumin/Globulin Ratio: 2.3 — ABNORMAL HIGH (ref 1.2–2.2)
Albumin: 4.9 g/dL (ref 3.5–5.5)
Alkaline Phosphatase: 79 IU/L (ref 39–117)
BUN/Creatinine Ratio: 13 (ref 9–23)
BUN: 11 mg/dL (ref 6–24)
Bilirubin Total: 0.3 mg/dL (ref 0.0–1.2)
CO2: 23 mmol/L (ref 20–29)
Calcium: 10.3 mg/dL — ABNORMAL HIGH (ref 8.7–10.2)
Chloride: 106 mmol/L (ref 96–106)
Creatinine, Ser: 0.87 mg/dL (ref 0.57–1.00)
GFR calc Af Amer: 93 mL/min/{1.73_m2} (ref >59)
GFR calc non Af Amer: 81 mL/min/{1.73_m2} (ref >59)
Globulin, Total: 2.1 g/dL (ref 1.5–4.5)
Glucose: 87 mg/dL (ref 65–99)
Potassium: 4.7 mmol/L (ref 3.5–5.2)
Sodium: 141 mmol/L (ref 134–144)
Total Protein: 7 g/dL (ref 6.0–8.5)

## 2018-05-30 NOTE — Telephone Encounter (Signed)
-----   Message from Clent Demark, PA-C sent at 05/30/2018  8:41 AM EST ----- Labs are normal.

## 2018-05-30 NOTE — Telephone Encounter (Signed)
Left message notifying patient that labs are normal. Bryan, CMA

## 2018-05-30 NOTE — Telephone Encounter (Signed)
LCSWA attempted to contact pt to follow up on behavioral health screens from prior appointment. LCSWA left message for a return call.  

## 2018-06-27 ENCOUNTER — Telehealth: Payer: Self-pay | Admitting: Licensed Clinical Social Worker

## 2018-06-27 NOTE — Telephone Encounter (Signed)
Call placed to patient to follow up on behavioral health consult. LCSWA introduced self and explained role at [facility]. LCSWA informed pt that a consult for behavioral health services was completed by PCP.   Patient shared that she has been diagnosed with Bipolar Disorder I, PTSD, and Social Anxiety in 2008. She currently has difficulty managing mental health conditions due to triggers, such as, financial strain, and the decline in both parents' health. Currently, patient receives behavioral health services with Therapist, Rodman Key, through [facility]. She is working part-time and has a pending disability case.   Patient was successful in identifying healthy coping skills. LCSWA informed patient of supportive resources to assist with obtaining independent housing and support with disability application through Legal Aid.   Patient was provided LCSWA's contact information and strongly encouraged to schedule an appointment, if the need arises. She plans to continue services through [facility]. No additional concerns noted.

## 2018-06-30 ENCOUNTER — Telehealth: Payer: Self-pay | Admitting: Licensed Clinical Social Worker

## 2018-06-30 NOTE — Telephone Encounter (Signed)
Supportive resources discussed with patient were mailed to residence, per pt request. A completed Legal Aid referral for assistance with disability appeal was faxed to Abelino Derrick at [phone]

## 2018-08-20 ENCOUNTER — Ambulatory Visit (INDEPENDENT_AMBULATORY_CARE_PROVIDER_SITE_OTHER): Payer: Self-pay | Admitting: Primary Care

## 2018-08-20 ENCOUNTER — Encounter (INDEPENDENT_AMBULATORY_CARE_PROVIDER_SITE_OTHER): Payer: Self-pay | Admitting: Primary Care

## 2018-08-20 ENCOUNTER — Other Ambulatory Visit: Payer: Self-pay

## 2018-08-20 VITALS — BP 148/83 | HR 59 | Temp 97.4°F | Ht 64.0 in | Wt 202.8 lb

## 2018-08-20 DIAGNOSIS — H538 Other visual disturbances: Secondary | ICD-10-CM

## 2018-08-20 DIAGNOSIS — Z833 Family history of diabetes mellitus: Secondary | ICD-10-CM

## 2018-08-20 DIAGNOSIS — R030 Elevated blood-pressure reading, without diagnosis of hypertension: Secondary | ICD-10-CM | POA: Insufficient documentation

## 2018-08-20 DIAGNOSIS — E0590 Thyrotoxicosis, unspecified without thyrotoxic crisis or storm: Secondary | ICD-10-CM | POA: Insufficient documentation

## 2018-08-20 DIAGNOSIS — E039 Hypothyroidism, unspecified: Secondary | ICD-10-CM

## 2018-08-20 DIAGNOSIS — E038 Other specified hypothyroidism: Secondary | ICD-10-CM

## 2018-08-20 NOTE — Progress Notes (Signed)
Acute Office Visit  Subjective:    Patient ID: Kristen Moore, female    DOB: 1971/01/11, 48 y.o.   MRN: [id number]  Chief Complaint  Patient presents with  . eye fluttering    HPI Patient is in today for concerns with eye fluttering ,and visual changes. Pastmedical history of asthma, anemia, bipolar 1 disorder, hypothyroidism, IBS, uterine fibroids, hysterosalpingo-oophorectomy, andcholecystectomy.   Past Medical History:  Diagnosis Date  . Abnormal Pap smear    cryo  . Anemia   . Asthma   . Bipolar 1 disorder ([place])   . Cyst of breast    left breast  . DUB (dysfunctional uterine bleeding)   . Fibroids   . Headache(784.0)   . Hypothyroidism   . IBS (irritable bowel syndrome)   . Thyroid dysfunction     Past Surgical History:  Procedure Laterality Date  . ABDOMINAL HYSTERECTOMY Bilateral 12/25/2012   Procedure: HYSTERECTOMY ABDOMINAL;  Surgeon: Emily Filbert, MD;  Location: [facility];  Service: Gynecology;  Laterality: Bilateral;  TAH and bilateral salpingectomy  . BILATERAL SALPINGECTOMY Bilateral 12/25/2012   Procedure: BILATERAL SALPINGECTOMY;  Surgeon: Emily Filbert, MD;  Location: [facility];  Service: Gynecology;  Laterality: Bilateral;  . CERVICAL BIOPSY  W/ LOOP ELECTRODE EXCISION    . CHOLECYSTECTOMY    . COLON SURGERY    . DILATION AND CURETTAGE OF UTERUS    . OOPHORECTOMY Bilateral 12/25/2012   Procedure: OOPHORECTOMY;  Surgeon: Emily Filbert, MD;  Location: [facility];  Service: Gynecology;  Laterality: Bilateral;  . TUBAL LIGATION      Family History  Problem Relation Age of Onset  . Heart disease Father   . Cancer Father 77       lung  . Hypertension Mother   . Rheum arthritis Mother   . Fibromyalgia Mother   . COPD Mother   . Sleep apnea Mother   . Breast cancer Maternal Grandmother   . Breast cancer Paternal Aunt     Social History   Socioeconomic History  . Marital status: Single    Spouse name: Not on file  . Number of children: Not on file  .  Years of education: Not on file  . Highest education level: Not on file  Occupational History  . Not on file  Social Needs  . Financial resource strain: Not on file  . Food insecurity:    Worry: Not on file    Inability: Not on file  . Transportation needs:    Medical: Not on file    Non-medical: Not on file  Tobacco Use  . Smoking status: Current Every Day Smoker    Packs/day: 0.50    Years: 9.00    Pack years: 4.50    Types: Cigarettes  . Smokeless tobacco: Never Used  Substance and Sexual Activity  . Alcohol use: No    Alcohol/week: 0.0 standard drinks  . Drug use: No  . Sexual activity: Yes    Birth control/protection: Surgical  Lifestyle  . Physical activity:    Days per week: Not on file    Minutes per session: Not on file  . Stress: Not on file  Relationships  . Social connections:    Talks on phone: Not on file    Gets together: Not on file    Attends religious service: Not on file    Active member of club or organization: Not on file    Attends meetings of clubs or organizations: Not on file  Relationship status: Not on file  . Intimate partner violence:    Fear of current or ex partner: Not on file    Emotionally abused: Not on file    Physically abused: Not on file    Forced sexual activity: Not on file  Other Topics Concern  . Not on file  Social History Narrative  . Not on file    Outpatient Medications Prior to Visit  Medication Sig Dispense Refill  . BLACK COHOSH PO Take 500 mg by mouth daily.    Marland Kitchen buPROPion (WELLBUTRIN XL) 300 MG 24 hr tablet Take 300 mg by mouth daily.    . hydrOXYzine (ATARAX/VISTARIL) 10 MG tablet Take 10 mg by mouth 3 (three) times daily as needed.    Marland Kitchen levothyroxine (SYNTHROID, LEVOTHROID) 150 MCG tablet Take 1 tablet (150 mcg total) by mouth daily. 90 tablet 1  . lithium carbonate 300 MG capsule Take 300-600 mg by mouth 2 (two) times daily with a meal. Takes 300 mg in the morning at 0800, 300 mg at 1600, and 600 mg at  bedtime    . Multiple Vitamin (MULTIVITAMIN) capsule Take 1 capsule by mouth daily.    Marland Kitchen atorvastatin (LIPITOR) 40 MG tablet Take 1 tablet (40 mg total) by mouth daily. (Patient not taking: Reported on 08/20/2018) 90 tablet 1  . potassium chloride (K-DUR) 10 MEQ tablet Take 1 tablet (10 mEq total) by mouth 2 (two) times daily. (Patient not taking: Reported on 05/29/2018) 60 tablet 3  . naproxen (NAPROSYN) 500 MG tablet Take 1 tablet (500 mg total) by mouth 2 (two) times daily with a meal. 14 tablet 0   No facility-administered medications prior to visit.     Allergies  Allergen Reactions  . Latex Swelling  . Penicillins Swelling      . Bee Venom Swelling  . Omega-3 Hives and Swelling    Potatoes    . Other Hives and Swelling    Potatoes       Review of Systems  Constitutional: Negative.   Eyes: Positive for blurred vision.  Respiratory: Negative.   Cardiovascular: Negative.   Gastrointestinal: Negative.   Genitourinary: Positive for frequency and urgency.  Musculoskeletal: Negative.   Skin: Negative.        Objective:    Physical Exam  Constitutional: She is oriented to person, place, and time. She appears well-developed and well-nourished.  HENT:  Head: Normocephalic and atraumatic.  Eyes: Pupils are equal, round, and reactive to light. EOM are normal.  Neck: Normal range of motion. Neck supple.  Cardiovascular: Normal rate and regular rhythm.  Pulmonary/Chest: Effort normal and breath sounds normal.  Abdominal: Soft. Bowel sounds are normal.  Musculoskeletal: Normal range of motion.  Neurological: She is alert and oriented to person, place, and time.  Skin: Skin is warm and dry.  Psychiatric: She has a normal mood and affect.    BP (!) 148/83 (BP Location: Left Arm, Patient Position: Sitting, Cuff Size: Normal)   Pulse (!) 59   Temp (!) 97.4 F (36.3 C) (Oral)   Ht 5\' 4"  (1.626 m)   Wt 202 lb 12.8 oz (92 kg)   LMP 11/29/2012   SpO2 96%   BMI 34.81 kg/m   Wt Readings from Last 3 Encounters:  08/20/18 202 lb 12.8 oz (92 kg)  05/29/18 202 lb 6.4 oz (91.8 kg)  04/30/18 199 lb 6.4 oz (90.4 kg)    There are no preventive care reminders to display for this patient.  There  are no preventive care reminders to display for this patient.   Lab Results  Component Value Date   TSH 0.178 (L) 04/30/2018   Lab Results  Component Value Date   WBC 7.0 05/29/2018   HGB 15.5 05/29/2018   HCT 45.0 05/29/2018   MCV 93 05/29/2018   PLT 228 05/29/2018   Lab Results  Component Value Date   NA 141 05/29/2018   K 4.7 05/29/2018   CO2 23 05/29/2018   GLUCOSE 87 05/29/2018   BUN 11 05/29/2018   CREATININE 0.87 05/29/2018   BILITOT 0.3 05/29/2018   ALKPHOS 79 05/29/2018   AST 17 05/29/2018   ALT 24 05/29/2018   PROT 7.0 05/29/2018   ALBUMIN 4.9 05/29/2018   CALCIUM 10.3 (H) 05/29/2018   ANIONGAP 9 08/09/2017   Lab Results  Component Value Date   CHOL 224 (H) 04/18/2018   Lab Results  Component Value Date   HDL 44 04/18/2018   Lab Results  Component Value Date   LDLCALC 124 (H) 04/18/2018   Lab Results  Component Value Date   TRIG 280 (H) 04/18/2018   Lab Results  Component Value Date   CHOLHDL 5.1 04/18/2018   Lab Results  Component Value Date   HGBA1C 4.7 (L) 04/18/2018       Assessment & Plan:  Fatema was seen today for eye fluttering.  Diagnoses and all orders for this visit:  Family history of diabetes mellitus -     HgB A1c 04/18/2018 4.7 Father recently dx and admitted to [facility] with 800 CBG   Blurred vision -     Ambulatory referral to Ophthalmology  Elevated blood pressure reading in office without diagnosis of hypertension Monitor Bp explained diet modification and exercise   Hypothyroidism Not taking medication due to affordability   No orders of the defined types were placed in this encounter.    Kerin Perna, NP

## 2018-08-21 ENCOUNTER — Other Ambulatory Visit (INDEPENDENT_AMBULATORY_CARE_PROVIDER_SITE_OTHER): Payer: Self-pay | Admitting: Primary Care

## 2018-08-21 DIAGNOSIS — E039 Hypothyroidism, unspecified: Secondary | ICD-10-CM

## 2018-08-21 LAB — THYROID PANEL WITH TSH
Free Thyroxine Index: 2.7 (ref 1.2–4.9)
T3 Uptake Ratio: 26 % (ref 24–39)
T4, Total: 10.5 ug/dL (ref 4.5–12.0)
TSH: 0.615 u[IU]/mL (ref 0.450–4.500)

## 2018-08-21 MED ORDER — LEVOTHYROXINE SODIUM 150 MCG PO TABS: 150.0000 ug | ORAL_TABLET | Freq: Every day | ORAL | 1 refills | Status: DC

## 2018-08-25 ENCOUNTER — Encounter (INDEPENDENT_AMBULATORY_CARE_PROVIDER_SITE_OTHER): Payer: Self-pay

## 2018-09-04 ENCOUNTER — Telehealth: Payer: Self-pay | Admitting: Primary Care

## 2018-09-04 ENCOUNTER — Other Ambulatory Visit: Payer: Self-pay

## 2018-09-04 ENCOUNTER — Emergency Department (HOSPITAL_COMMUNITY)
Admission: EM | Admit: 2018-09-04 | Discharge: 2018-09-04 | Disposition: A | Discharge status: Home / Self Care | Payer: No Typology Code available for payment source | Attending: Emergency Medicine | Admitting: Emergency Medicine

## 2018-09-04 ENCOUNTER — Encounter (HOSPITAL_COMMUNITY): Payer: Self-pay | Admitting: Emergency Medicine

## 2018-09-04 DIAGNOSIS — E039 Hypothyroidism, unspecified: Secondary | ICD-10-CM | POA: Insufficient documentation

## 2018-09-04 DIAGNOSIS — J45909 Unspecified asthma, uncomplicated: Secondary | ICD-10-CM | POA: Insufficient documentation

## 2018-09-04 DIAGNOSIS — F17210 Nicotine dependence, cigarettes, uncomplicated: Secondary | ICD-10-CM | POA: Insufficient documentation

## 2018-09-04 DIAGNOSIS — Z79899 Other long term (current) drug therapy: Secondary | ICD-10-CM | POA: Insufficient documentation

## 2018-09-04 DIAGNOSIS — Z91040 Latex allergy status: Secondary | ICD-10-CM | POA: Insufficient documentation

## 2018-09-04 DIAGNOSIS — B349 Viral infection, unspecified: Secondary | ICD-10-CM

## 2018-09-04 NOTE — Discharge Instructions (Signed)
Please read attached information. If you experience any new or worsening signs or symptoms please return to the emergency room for evaluation. Please follow-up with your primary care provider or specialist as discussed.  °

## 2018-09-04 NOTE — Telephone Encounter (Signed)
Patient was informed on TSH lab results. Pt. Understood/ verified DOB. Pt. Is aware TSH medication was refilled.

## 2018-09-04 NOTE — ED Provider Notes (Signed)
[facility] EMERGENCY DEPARTMENT Provider Note   CSN: [id number] Arrival date & time: 09/04/18  1250    History   Chief Complaint Chief Complaint  Patient presents with  . Fever  . Sore Throat    HPI Kristen Moore is a 48 y.o. female.     HPI   48 year old female presents today with complaints of 1 day history of feeling unwell.  She notes yesterday she developed body aches and subjective fever.  She notes minor sore throat yesterday.  She denies any rhinorrhea nasal congestion or cough.  She denies any chest pain.  She denies any close sick contacts.  Patient reports she is a smoker but denies any history of pulmonary disorders.  No medications prior to arrival today.  No recent travel or exposure to anyone with known Coronavirus.     Past Medical History:  Diagnosis Date  . Abnormal Pap smear    cryo  . Anemia   . Asthma   . Bipolar 1 disorder ([place])   . Cyst of breast    left breast  . DUB (dysfunctional uterine bleeding)   . Fibroids   . Headache(784.0)   . Hypothyroidism   . IBS (irritable bowel syndrome)   . Thyroid dysfunction     Patient Active Problem List   Diagnosis Date Noted  . Elevated blood pressure reading in office without diagnosis of hypertension 08/20/2018  . Hyperthyroidism 08/20/2018  . 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 Rotator cuff impingement syndrome 09/03/2016  . 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 IBS (irritable bowel syndrome) 10/21/2015  . Dyshidrotic hand dermatitis 10/21/2015  . Dyshidrotic foot dermatitis 10/21/2015  . Bright red blood per rectum 10/21/2015  . S/P bilateral oophorectomy 10/21/2015  . Hypothyroidism 10/21/2015  . HLD (hyperlipidemia) 03/18/2015  . Knee pain, right 12/13/2014  . Pelvic pain 12/25/2012  . CHEST PAIN UNSPECIFIED 11/05/2008  . ELECTROCARDIOGRAM, ABNORMAL 11/05/2008    Past Surgical History:  Procedure Laterality  Date  . ABDOMINAL HYSTERECTOMY Bilateral 12/25/2012   Procedure: HYSTERECTOMY ABDOMINAL;  Surgeon: Emily Filbert, MD;  Location: [facility];  Service: Gynecology;  Laterality: Bilateral;  TAH and bilateral salpingectomy  . BILATERAL SALPINGECTOMY Bilateral 12/25/2012   Procedure: BILATERAL SALPINGECTOMY;  Surgeon: Emily Filbert, MD;  Location: [facility];  Service: Gynecology;  Laterality: Bilateral;  . CERVICAL BIOPSY  W/ LOOP ELECTRODE EXCISION    . CHOLECYSTECTOMY    . COLON SURGERY    . DILATION AND CURETTAGE OF UTERUS    . OOPHORECTOMY Bilateral 12/25/2012   Procedure: OOPHORECTOMY;  Surgeon: Emily Filbert, MD;  Location: [facility];  Service: Gynecology;  Laterality: Bilateral;  . TUBAL LIGATION       OB History    Gravida  3   Para  2   Term  1   Preterm  1   AB  1   Living  1     SAB      TAB  1   Ectopic      Multiple      Live Births               Home Medications    Prior to Admission medications   Medication Sig Start Date End Date Taking? Authorizing Provider  atorvastatin (LIPITOR) 40 MG tablet Take 1 tablet (40 mg total) by mouth daily. Patient not taking: Reported on 08/20/2018 04/30/18   Clent Demark, PA-C  BLACK COHOSH PO Take 500 mg by mouth daily.    [provider]  buPROPion (WELLBUTRIN XL) 300 MG 24 hr tablet Take 300 mg by mouth daily.    [provider]  hydrOXYzine (ATARAX/VISTARIL) 10 MG tablet Take 10 mg by mouth 3 (three) times daily as needed.    [provider]  levothyroxine (SYNTHROID, LEVOTHROID) 150 MCG tablet Take 1 tablet (150 mcg total) by mouth daily. 08/21/18   Kerin Perna, NP  lithium carbonate 300 MG capsule Take 300-600 mg by mouth 2 (two) times daily with a meal. Takes 300 mg in the morning at 0800, 300 mg at 1600, and 600 mg at bedtime    [provider]  Multiple Vitamin (MULTIVITAMIN) capsule Take 1 capsule by mouth daily.    [provider]  potassium chloride (K-DUR) 10 MEQ  tablet Take 1 tablet (10 mEq total) by mouth 2 (two) times daily. Patient not taking: Reported on 05/29/2018 11/14/17   Clent Demark, PA-C    Family History Family History  Problem Relation Age of Onset  . Heart disease Father   . Cancer Father 74       lung  . Hypertension Mother   . Rheum arthritis Mother   . Fibromyalgia Mother   . COPD Mother   . Sleep apnea Mother   . Breast cancer Maternal Grandmother   . Breast cancer Paternal Aunt     Social History Social History   Tobacco Use  . Smoking status: Current Every Day Smoker    Packs/day: 0.50    Years: 9.00    Pack years: 4.50    Types: Cigarettes  . Smokeless tobacco: Never Used  Substance Use Topics  . Alcohol use: No    Alcohol/week: 0.0 standard drinks  . Drug use: No     Allergies   Latex; Penicillins; Bee venom; Omega-3; and Other   Review of Systems Review of Systems  All other systems reviewed and are negative.   Physical Exam Updated Vital Signs BP (!) 146/96 (BP Location: Right Arm)   Pulse 63   Temp 98 F (36.7 C) (Oral)   Resp 17   LMP 11/29/2012   SpO2 100%   Physical Exam Vitals signs and nursing note reviewed.  Constitutional:      Appearance: She is well-developed.  HENT:     Head: Normocephalic and atraumatic.     Comments: Oropharynx clear no erythema exudate or edema-no rhinorrhea noted Eyes:     General: No scleral icterus.       Right eye: No discharge.        Left eye: No discharge.     Conjunctiva/sclera: Conjunctivae normal.     Pupils: Pupils are equal, round, and reactive to light.  Neck:     Musculoskeletal: Normal range of motion.     Vascular: No JVD.     Trachea: No tracheal deviation.  Pulmonary:     Effort: Pulmonary effort is normal. No respiratory distress.     Breath sounds: Normal breath sounds. No stridor. No wheezing, rhonchi or rales.  Neurological:     Mental Status: She is alert and oriented to person, place, and time.     Coordination:  Coordination normal.  Psychiatric:        Behavior: Behavior normal.        Thought Content: Thought content normal.        Judgment: Judgment normal.      ED Treatments / Results  Labs (all labs ordered are listed, but only abnormal results are displayed) Labs Reviewed - No data to display  EKG None  Radiology No results found.  Procedures Procedures (including critical care time)  Medications Ordered in ED Medications - No data to display   Initial Impression / Assessment and Plan / ED Course  I have reviewed the triage vital signs and the nursing notes.  Pertinent labs & imaging results that were available during my care of the patient were reviewed by me and considered in my medical decision making (see chart for details).        Assessment/Plan: 48 year old female presents today with complaints of fatigue.  Patient has no objective findings of illness on my exam she is very well-appearing in no acute distress.  Her symptoms could likely be initial onset of viral illness.  Patient discharged with symptomatic care strict return precautions and outpatient follow-up.  She verbalized understanding and agreement to today's plan had no further questions or concerns.  Kristen Moore was evaluated in Emergency Department on 09/04/2018 for the symptoms described in the history of present illness. She was evaluated in the context of the global COVID-19 pandemic, which necessitated consideration that the patient might be at risk for infection with the SARS-CoV-2 virus that causes COVID-19. Institutional protocols and algorithms that pertain to the evaluation of patients at risk for COVID-19 are in a state of rapid change based on information released by regulatory bodies including the CDC and federal and state organizations. These policies and algorithms were followed during the patient's care in the ED.   Final Clinical Impressions(s) / ED Diagnoses   Final diagnoses:  Viral  illness    ED Discharge Orders    None       Francee Gentile 09/04/18 1403    Davonna Belling, MD 09/05/18 1134

## 2018-09-04 NOTE — Telephone Encounter (Signed)
Pt called in stated that she hasnt received her results would like a follow up. Pt also stated she was having a sore throat and minor chest pains yesterday advised pt to go to ED pt understood.

## 2018-09-04 NOTE — ED Triage Notes (Signed)
PT reports body aches AND fevers/ sore throat. Pt reports feeling "ill yesterday".

## 2019-01-03 ENCOUNTER — Emergency Department (HOSPITAL_COMMUNITY)
Admission: EM | Admit: 2019-01-03 | Discharge: 2019-01-03 | Disposition: A | Discharge status: Home / Self Care | Payer: Self-pay | Attending: Emergency Medicine | Admitting: Emergency Medicine

## 2019-01-03 ENCOUNTER — Encounter (HOSPITAL_COMMUNITY): Payer: Self-pay | Admitting: *Deleted

## 2019-01-03 ENCOUNTER — Emergency Department (HOSPITAL_COMMUNITY): Payer: Self-pay

## 2019-01-03 DIAGNOSIS — G8929 Other chronic pain: Secondary | ICD-10-CM | POA: Insufficient documentation

## 2019-01-03 DIAGNOSIS — Z8709 Personal history of other diseases of the respiratory system: Secondary | ICD-10-CM | POA: Insufficient documentation

## 2019-01-03 DIAGNOSIS — M25511 Pain in right shoulder: Secondary | ICD-10-CM | POA: Insufficient documentation

## 2019-01-03 DIAGNOSIS — Z91040 Latex allergy status: Secondary | ICD-10-CM | POA: Insufficient documentation

## 2019-01-03 DIAGNOSIS — F17210 Nicotine dependence, cigarettes, uncomplicated: Secondary | ICD-10-CM | POA: Insufficient documentation

## 2019-01-03 DIAGNOSIS — E039 Hypothyroidism, unspecified: Secondary | ICD-10-CM | POA: Insufficient documentation

## 2019-01-03 DIAGNOSIS — F319 Bipolar disorder, unspecified: Secondary | ICD-10-CM | POA: Insufficient documentation

## 2019-01-03 DIAGNOSIS — Z79899 Other long term (current) drug therapy: Secondary | ICD-10-CM | POA: Insufficient documentation

## 2019-01-03 LAB — CBG MONITORING, ED: Glucose-Capillary: 81 mg/dL (ref 70–99)

## 2019-01-03 MED ORDER — PREDNISONE 20 MG PO TABS: 20.0000 mg | ORAL_TABLET | Freq: Two times a day (BID) | ORAL | 0 refills | Status: AC

## 2019-01-03 MED ORDER — METHOCARBAMOL 500 MG PO TABS: 500.0000 mg | ORAL_TABLET | Freq: Two times a day (BID) | ORAL | 0 refills | Status: DC

## 2019-01-03 NOTE — Discharge Instructions (Signed)
It was my pleasure taking care of you today!   Ice shoulder throughout the day (instructions below).   You are prescribed Robaxin, a muscle relaxant. Some common side effects of this medication include:  Feeling sleepy.  Dizziness. Take care upon going from a seated to a standing position.  Dry mouth.  Feeling tired or weak.  Hard stools (constipation).  Upset stomach. These are not all of the side effects that may occur. If you have questions about side effects, call your doctor. Call your primary care provider for medical advice about side effects.  This medication can be sedating. Only take this medication as needed. Please do not combine with alcohol. Do not drive or operate machinery while taking this medication.   This medication can interact with some other medications. Make sure to tell any provider you are taking this medication before they prescribe you a new medication.   You are prescribed prednisone, a steroid. This is a medication to help reduce inflammation in the musculature.  Common side effects include upset stomach/nausea. You may take this medicine with food if this occurs. Other side effects include restlessness, difficulty sleeping, and increased sweating. Call your healthcare provider if these do not resolve after finishing the medication.  This medicine may increase your blood sugar so additional careful monitoring is needed of blood sugar if you have diabetes. Call your healthcare provider for any signs/symtpoms of high blood sugar such as confusion, feeling sleepy, more thirst, more hunger, passing urine more often, flushing, fast breathing, or breath that smells like fruit.   Wear shoulder sling for no more than 3 days, then begin performing gentle range of motion exercises.   Call the orthopedist listed today or tomorrow to schedule a follow up appointment for recheck of ongoing shoulder pain in 1-2 weeks that can be canceled with a 24-48 hour notice if complete  resolution of pain.   Call the orthopedist listed if symptoms are not improved in one week.   Return to the ER for new or worsening symptoms, any additional concerns.  COLD THERAPY DIRECTIONS:  Ice or gel packs can be used to reduce both pain and swelling. Ice is the most helpful within the first 24 to 48 hours after an injury or flareup from overusing a muscle or joint.  Ice is effective, has very few side effects, and is safe for most people to use.   If you expose your skin to cold temperatures for too long or without the proper protection, you can damage your skin or nerves. Watch for signs of skin damage due to cold.   HOME CARE INSTRUCTIONS  Follow these tips to use ice and cold packs safely.  Place a dry or damp towel between the ice and skin. A damp towel will cool the skin more quickly, so you may need to shorten the time that the ice is used.  For a more rapid response, add gentle compression to the ice.  Ice for no more than 10 to 20 minutes at a time. The bonier the area you are icing, the less time it will take to get the benefits of ice.  Check your skin after 5 minutes to make sure there are no signs of a poor response to cold or skin damage.  Rest 20 minutes or more in between uses.  Once your skin is numb, you can end your treatment. You can test numbness by very lightly touching your skin. The touch should be so light that you  do not see the skin dimple from the pressure of your fingertip. When using ice, most people will feel these normal sensations in this order: cold, burning, aching, and numbness.

## 2019-01-03 NOTE — ED Notes (Signed)
Patient verbalizes understanding of discharge instructions. Opportunity for questioning and answers were provided. Armband removed by staff, pt discharged from ED.  

## 2019-01-03 NOTE — ED Notes (Signed)
Patient transported to X-ray 

## 2019-01-03 NOTE — ED Triage Notes (Signed)
To ED for eval of right shoulder pain. Hx of same. States she a hx of bursitis and has had a serious of shots but drs wont inject anymore. States she thinks she may have further injured at work - lifting arm over head. Complains of burning and itching in shoulder. Pt is able to move fingers without pain. Pain starts when pt lifts arm.

## 2019-01-03 NOTE — ED Provider Notes (Signed)
[facility] EMERGENCY DEPARTMENT Provider Note   CSN: [id number] Arrival date & time: 01/03/19  1317     History   Chief Complaint Chief Complaint  Patient presents with  . Shoulder Pain    HPI Kristen Moore is a 48 y.o. female.     HPI  Patient is a 48 year old female with past medical history of bipolar disorder, anemia, hypothyroidism, rotator cuff impingement presenting for right shoulder pain.  Kristen Moore reports right shoulder pain times many years.  Kristen Moore reports Kristen Moore was previously diagnosed with rotator cuff impingement.  Kristen Moore reports that Kristen Moore thinks Kristen Moore did more activity over her head than Kristen Moore is used to and Kristen Moore began having "clicking" in her shoulder and feeling that is "out of place" over the last couple days.  Kristen Moore reports that her arm feels stiff, but denies weakness or numbness.  Patient reports that Kristen Moore feels "burning" starting in her neck and radiating around to her right shoulder.  Denies history of known neck injury.  Kristen Moore does do a lot of physical activity in the jobs that Kristen Moore has had over the last several years.  Patient previously saw orthopedics who injected steroids into her shoulder, and Kristen Moore went to physical therapy.  Past Medical History:  Diagnosis Date  . Abnormal Pap smear    cryo  . Anemia   . Asthma   . Bipolar 1 disorder ([place])   . Cyst of breast    left breast  . DUB (dysfunctional uterine bleeding)   . Fibroids   . Headache(784.0)   . Hypothyroidism   . IBS (irritable bowel syndrome)   . Thyroid dysfunction     Patient Active Problem List   Diagnosis Date Noted  . Elevated blood pressure reading in office without diagnosis of hypertension 08/20/2018  . Hyperthyroidism 08/20/2018  . 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 Rotator cuff impingement syndrome 09/03/2016  . 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 IBS (irritable bowel syndrome) 10/21/2015  . Dyshidrotic hand  dermatitis 10/21/2015  . Dyshidrotic foot dermatitis 10/21/2015  . Bright red blood per rectum 10/21/2015  . S/P bilateral oophorectomy 10/21/2015  . Hypothyroidism 10/21/2015  . HLD (hyperlipidemia) 03/18/2015  . Knee pain, right 12/13/2014  . Pelvic pain 12/25/2012  . CHEST PAIN UNSPECIFIED 11/05/2008  . ELECTROCARDIOGRAM, ABNORMAL 11/05/2008    Past Surgical History:  Procedure Laterality Date  . ABDOMINAL HYSTERECTOMY Bilateral 12/25/2012   Procedure: HYSTERECTOMY ABDOMINAL;  Surgeon: Emily Filbert, MD;  Location: [facility];  Service: Gynecology;  Laterality: Bilateral;  TAH and bilateral salpingectomy  . BILATERAL SALPINGECTOMY Bilateral 12/25/2012   Procedure: BILATERAL SALPINGECTOMY;  Surgeon: Emily Filbert, MD;  Location: [facility];  Service: Gynecology;  Laterality: Bilateral;  . CERVICAL BIOPSY  W/ LOOP ELECTRODE EXCISION    . CHOLECYSTECTOMY    . COLON SURGERY    . DILATION AND CURETTAGE OF UTERUS    . OOPHORECTOMY Bilateral 12/25/2012   Procedure: OOPHORECTOMY;  Surgeon: Emily Filbert, MD;  Location: [facility];  Service: Gynecology;  Laterality: Bilateral;  . TUBAL LIGATION       OB History    Gravida  3   Para  2   Term  1   Preterm  1   AB  1   Living  1     SAB      TAB  1   Ectopic      Multiple  Live Births               Home Medications    Prior to Admission medications   Medication Sig Start Date End Date Taking? Authorizing Provider  atorvastatin (LIPITOR) 40 MG tablet Take 1 tablet (40 mg total) by mouth daily. Patient not taking: Reported on 08/20/2018 04/30/18   Clent Demark, PA-C  BLACK COHOSH PO Take 500 mg by mouth daily.    [provider]  buPROPion (WELLBUTRIN XL) 300 MG 24 hr tablet Take 300 mg by mouth daily.    [provider]  hydrOXYzine (ATARAX/VISTARIL) 10 MG tablet Take 10 mg by mouth 3 (three) times daily as needed.    [provider]  levothyroxine (SYNTHROID, LEVOTHROID) 150 MCG tablet Take 1  tablet (150 mcg total) by mouth daily. 08/21/18   Kerin Perna, NP  lithium carbonate 300 MG capsule Take 300-600 mg by mouth 2 (two) times daily with a meal. Takes 300 mg in the morning at 0800, 300 mg at 1600, and 600 mg at bedtime    [provider]  Multiple Vitamin (MULTIVITAMIN) capsule Take 1 capsule by mouth daily.    [provider]  potassium chloride (K-DUR) 10 MEQ tablet Take 1 tablet (10 mEq total) by mouth 2 (two) times daily. Patient not taking: Reported on 05/29/2018 11/14/17   Clent Demark, PA-C    Family History Family History  Problem Relation Age of Onset  . Heart disease Father   . Cancer Father 40       lung  . Hypertension Mother   . Rheum arthritis Mother   . Fibromyalgia Mother   . COPD Mother   . Sleep apnea Mother   . Breast cancer Maternal Grandmother   . Breast cancer Paternal Aunt     Social History Social History   Tobacco Use  . Smoking status: Current Every Day Smoker    Packs/day: 0.50    Years: 9.00    Pack years: 4.50    Types: Cigarettes  . Smokeless tobacco: Never Used  Substance Use Topics  . Alcohol use: No    Alcohol/week: 0.0 standard drinks  . Drug use: No     Allergies   Latex, Penicillins, Bee venom, Omega-3, and Other   Review of Systems Review of Systems  Musculoskeletal: Positive for arthralgias, myalgias and neck pain. Negative for neck stiffness.  Neurological: Negative for weakness and numbness.     Physical Exam Updated Vital Signs BP 128/81 (BP Location: Left Arm)   Pulse 65   Temp 97.7 F (36.5 C) (Oral)   Resp 16   Ht 5\' 4"  (1.626 m)   Wt 90.7 kg   LMP 11/29/2012   SpO2 97%   BMI 34.33 kg/m   Physical Exam Vitals signs and nursing note reviewed.  Constitutional:      General: Kristen Moore is not in acute distress.    Appearance: Kristen Moore is well-developed. Kristen Moore is not diaphoretic.     Comments: Sitting comfortably in bed.  HENT:     Head: Normocephalic and atraumatic.  Eyes:      General:        Right eye: No discharge.        Left eye: No discharge.     Conjunctiva/sclera: Conjunctivae normal.     Comments: EOMs normal to gross examination.  Neck:     Musculoskeletal: Normal range of motion.     Comments: Right sided paracervical muscular tenderness present.  Cardiovascular:  Rate and Rhythm: Normal rate and regular rhythm.     Comments: Intact, 2+ right radial pulse. Pulmonary:     Comments: Converses comfortably.  No audible wheeze or stridor. Abdominal:     General: There is no distension.  Musculoskeletal:        General: Tenderness present.     Comments: Right shoulder with tenderness to palpation of posterior shoulder around rotator cuff muscles insertion site as depicted in image. Decreased ROM. Positive empty can test, positive Neer's. No swelling, erythema or ecchymosis present. No step-off, or deformity appreciated. 5/5 muscle strength of RUE. 2+ radial pulse, sensation intact and all compartments soft.   Skin:    General: Skin is warm and dry.  Neurological:     Mental Status: Kristen Moore is alert.     Comments: Cranial nerves intact to gross observation. Patient moves extremities without difficulty.  Psychiatric:        Behavior: Behavior normal.        Thought Content: Thought content normal.        Judgment: Judgment normal.      ED Treatments / Results  Labs (all labs ordered are listed, but only abnormal results are displayed) Labs Reviewed  CBG MONITORING, ED    EKG None  Radiology Dg Shoulder Right  Result Date: 01/03/2019 CLINICAL DATA:  pain. Hx of same. States Kristen Moore a hx of bursitis and has had a series of shots but drs wont inject anymore. States Kristen Moore thinks Kristen Moore may have further injured at work - lifting arm over head. Complains of burning and itching in shoulder. EXAM: RIGHT SHOULDER - 2+ VIEW COMPARISON:  None. FINDINGS: There is no evidence of fracture or dislocation. There is no evidence of arthropathy or other focal bone  abnormality. Soft tissues are unremarkable. IMPRESSION: Negative. Electronically Signed   By: Lucrezia Europe M.D.   On: 01/03/2019 15:06    Procedures Procedures (including critical care time)  Medications Ordered in ED Medications - No data to display   Initial Impression / Assessment and Plan / ED Course  I have reviewed the triage vital signs and the nursing notes.  Pertinent labs & imaging results that were available during my care of the patient were reviewed by me and considered in my medical decision making (see chart for details).        This is a well-appearing 48 year old female with past medical history of bipolar disorder, anemia, hypothyroidism, rotator cuff impingement presenting for right shoulder pain.  Suggestive of ongoing rotator cuff impingement syndrome versus cervical radiculopathy.  Pain is only with movement, and not concerning for cardiac etiology of shoulder pain.   Radiograph, reviewed by me without evidence of acute bony abnormality.  I discussed with the patient that Kristen Moore may need further imaging with orthopedics and should follow-up needed.  Given possibility of cervical radiculopathy, patient may benefit from short course of steroids.  Drug interaction screens were performed for lithium and muscle relaxants, steroids, with no acute findings.  CBG nonelevated. Patient is also given follow-up information for physical therapy, as Kristen Moore has previously benefited from this.   Return precautions given for any increasing pain, swelling, erythema of the shoulder or any neurologic signs or symptoms in the RUE.  Patient is in understanding and agrees with the plan of care.  Final Clinical Impressions(s) / ED Diagnoses   Final diagnoses:  Chronic right shoulder pain    ED Discharge Orders         Ordered  predniSONE (DELTASONE) 20 MG tablet  2 times daily with meals     01/03/19 1617    methocarbamol (ROBAXIN) 500 MG tablet  2 times daily     01/03/19 1617            Albesa Seen, [place] 01/03/19 1653    Charlesetta Shanks, MD 01/04/19 1152

## 2019-01-22 ENCOUNTER — Encounter (HOSPITAL_COMMUNITY): Payer: Self-pay

## 2019-01-22 ENCOUNTER — Other Ambulatory Visit: Payer: Self-pay

## 2019-01-22 ENCOUNTER — Emergency Department (HOSPITAL_COMMUNITY)
Admission: EM | Admit: 2019-01-22 | Discharge: 2019-01-22 | Disposition: A | Discharge status: Home / Self Care | Payer: Worker's Compensation | Attending: Emergency Medicine | Admitting: Emergency Medicine

## 2019-01-22 ENCOUNTER — Emergency Department (HOSPITAL_COMMUNITY): Payer: Worker's Compensation

## 2019-01-22 DIAGNOSIS — E039 Hypothyroidism, unspecified: Secondary | ICD-10-CM | POA: Insufficient documentation

## 2019-01-22 DIAGNOSIS — S6991XA Unspecified injury of right wrist, hand and finger(s), initial encounter: Secondary | ICD-10-CM

## 2019-01-22 DIAGNOSIS — Z8709 Personal history of other diseases of the respiratory system: Secondary | ICD-10-CM | POA: Insufficient documentation

## 2019-01-22 DIAGNOSIS — S61212A Laceration without foreign body of right middle finger without damage to nail, initial encounter: Secondary | ICD-10-CM | POA: Diagnosis not present

## 2019-01-22 DIAGNOSIS — Y9289 Other specified places as the place of occurrence of the external cause: Secondary | ICD-10-CM | POA: Diagnosis not present

## 2019-01-22 DIAGNOSIS — Z79899 Other long term (current) drug therapy: Secondary | ICD-10-CM | POA: Insufficient documentation

## 2019-01-22 DIAGNOSIS — Z23 Encounter for immunization: Secondary | ICD-10-CM | POA: Insufficient documentation

## 2019-01-22 DIAGNOSIS — F17210 Nicotine dependence, cigarettes, uncomplicated: Secondary | ICD-10-CM | POA: Insufficient documentation

## 2019-01-22 DIAGNOSIS — F319 Bipolar disorder, unspecified: Secondary | ICD-10-CM | POA: Diagnosis not present

## 2019-01-22 DIAGNOSIS — W230XXA Caught, crushed, jammed, or pinched between moving objects, initial encounter: Secondary | ICD-10-CM | POA: Diagnosis not present

## 2019-01-22 DIAGNOSIS — Y939 Activity, unspecified: Secondary | ICD-10-CM | POA: Insufficient documentation

## 2019-01-22 DIAGNOSIS — Y990 Civilian activity done for income or pay: Secondary | ICD-10-CM | POA: Insufficient documentation

## 2019-01-22 DIAGNOSIS — Z91040 Latex allergy status: Secondary | ICD-10-CM | POA: Diagnosis not present

## 2019-01-22 MED ORDER — BACITRACIN ZINC 500 UNIT/GM EX OINT
TOPICAL_OINTMENT | Freq: Once | CUTANEOUS | Status: AC
  Administered 2019-01-22: 13:00:00 via TOPICAL

## 2019-01-22 MED ORDER — ACETAMINOPHEN 500 MG PO TABS
1000.0000 mg | ORAL_TABLET | Freq: Once | ORAL | Status: AC
  Administered 2019-01-22: 1000 mg via ORAL
  Filled 2019-01-22: qty 2

## 2019-01-22 MED ORDER — TETANUS-DIPHTH-ACELL PERTUSSIS 5-2.5-18.5 LF-MCG/0.5 IM SUSP
0.5000 mL | Freq: Once | INTRAMUSCULAR | Status: AC
  Administered 2019-01-22: 0.5 mL via INTRAMUSCULAR
  Filled 2019-01-22: qty 0.5

## 2019-01-22 NOTE — ED Notes (Signed)
Patient transported to X-ray 

## 2019-01-22 NOTE — Discharge Instructions (Signed)
Keep the wound clean and dry for the first 24 hours. After that you may gently clean the wound with soap and water. Make sure to pat dry the wound before covering it with any dressing. You can use topical antibiotic ointment and bandage. Ice and elevate for pain relief.   You can take Tylenol or Ibuprofen as directed for pain. You can alternate Tylenol and Ibuprofen every 4 hours for additional pain relief.   Monitor closely for any signs of infection. Return to the Emergency Department for any worsening redness/swelling of the area that begins to spread, drainage from the site, worsening pain, fever or any other worsening or concerning symptoms.    

## 2019-01-22 NOTE — ED Triage Notes (Signed)
Pt arrives POV from work c/o injury to left middle finger. Pt was at work and accidentally placed her finger in metal door hinge and had smashed her finger. Pt's bleeding is controlled; nail is still intact.

## 2019-01-22 NOTE — ED Notes (Signed)
Patient was given a [place] w/ saline to soak Finger.

## 2019-01-22 NOTE — ED Provider Notes (Signed)
[facility] EMERGENCY DEPARTMENT Provider Note   CSN: [id number] Arrival date & time: 01/22/19  6712     History   Chief Complaint Chief Complaint  Patient presents with  . Finger Injury    HPI Kristen Moore is a 48 y.o. female past medical history of asthma, Bipolar 1 who presents for evaluation of injury to right third digit that occurred about 8 AM this morning.  She reports that she accidentally slammed the finger in the door at work.  Reports since then, has pain in the distal aspect of the finger.  He states pain is worse with movement.  She is not currently on blood thinners.  She does not know when her last tetanus shot was.  She denies any numbness/weakness.     The history is provided by the patient.    Past Medical History:  Diagnosis Date  . Abnormal Pap smear    cryo  . Anemia   . Asthma   . Bipolar 1 disorder ([place])   . Cyst of breast    left breast  . DUB (dysfunctional uterine bleeding)   . Fibroids   . Headache(784.0)   . Hypothyroidism   . IBS (irritable bowel syndrome)   . Thyroid dysfunction     Patient Active Problem List   Diagnosis Date Noted  . Elevated blood pressure reading in office without diagnosis of hypertension 08/20/2018  . Hyperthyroidism 08/20/2018  . 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 Rotator cuff impingement syndrome 09/03/2016  . 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 IBS (irritable bowel syndrome) 10/21/2015  . Dyshidrotic hand dermatitis 10/21/2015  . Dyshidrotic foot dermatitis 10/21/2015  . Bright red blood per rectum 10/21/2015  . S/P bilateral oophorectomy 10/21/2015  . Hypothyroidism 10/21/2015  . HLD (hyperlipidemia) 03/18/2015  . Knee pain, right 12/13/2014  . Pelvic pain 12/25/2012  . CHEST PAIN UNSPECIFIED 11/05/2008  . ELECTROCARDIOGRAM, ABNORMAL 11/05/2008    Past Surgical History:  Procedure Laterality Date  . ABDOMINAL  HYSTERECTOMY Bilateral 12/25/2012   Procedure: HYSTERECTOMY ABDOMINAL;  Surgeon: Emily Filbert, MD;  Location: [facility];  Service: Gynecology;  Laterality: Bilateral;  TAH and bilateral salpingectomy  . BILATERAL SALPINGECTOMY Bilateral 12/25/2012   Procedure: BILATERAL SALPINGECTOMY;  Surgeon: Emily Filbert, MD;  Location: [facility];  Service: Gynecology;  Laterality: Bilateral;  . CERVICAL BIOPSY  W/ LOOP ELECTRODE EXCISION    . CHOLECYSTECTOMY    . COLON SURGERY    . DILATION AND CURETTAGE OF UTERUS    . OOPHORECTOMY Bilateral 12/25/2012   Procedure: OOPHORECTOMY;  Surgeon: Emily Filbert, MD;  Location: [facility];  Service: Gynecology;  Laterality: Bilateral;  . TUBAL LIGATION       OB History    Gravida  3   Para  2   Term  1   Preterm  1   AB  1   Living  1     SAB      TAB  1   Ectopic      Multiple      Live Births               Home Medications    Prior to Admission medications   Medication Sig Start Date End Date Taking? Authorizing Provider  atorvastatin (LIPITOR) 40 MG tablet Take 1 tablet (40 mg total) by mouth daily. Patient not taking: Reported on 08/20/2018 04/30/18   Clent Demark, PA-C  BLACK  COHOSH PO Take 500 mg by mouth daily.    [provider]  buPROPion (WELLBUTRIN XL) 300 MG 24 hr tablet Take 300 mg by mouth daily.    [provider]  hydrOXYzine (ATARAX/VISTARIL) 10 MG tablet Take 10 mg by mouth 3 (three) times daily as needed.    [provider]  levothyroxine (SYNTHROID, LEVOTHROID) 150 MCG tablet Take 1 tablet (150 mcg total) by mouth daily. 08/21/18   Kerin Perna, NP  lithium carbonate 300 MG capsule Take 300-600 mg by mouth 2 (two) times daily with a meal. Takes 300 mg in the morning at 0800, 300 mg at 1600, and 600 mg at bedtime    [provider]  methocarbamol (ROBAXIN) 500 MG tablet Take 1 tablet (500 mg total) by mouth 2 (two) times daily. 01/03/19   Langston Masker B, PA-C  Multiple Vitamin  (MULTIVITAMIN) capsule Take 1 capsule by mouth daily.    [provider]  potassium chloride (K-DUR) 10 MEQ tablet Take 1 tablet (10 mEq total) by mouth 2 (two) times daily. Patient not taking: Reported on 05/29/2018 11/14/17   Clent Demark, PA-C    Family History Family History  Problem Relation Age of Onset  . Heart disease Father   . Cancer Father 49       lung  . Hypertension Mother   . Rheum arthritis Mother   . Fibromyalgia Mother   . COPD Mother   . Sleep apnea Mother   . Breast cancer Maternal Grandmother   . Breast cancer Paternal Aunt     Social History Social History   Tobacco Use  . Smoking status: Current Every Day Smoker    Packs/day: 0.50    Years: 9.00    Pack years: 4.50    Types: Cigarettes  . Smokeless tobacco: Never Used  Substance Use Topics  . Alcohol use: No    Alcohol/week: 0.0 standard drinks  . Drug use: No     Allergies   Latex, Penicillins, Bee venom, Omega-3, and Other   Review of Systems Review of Systems  Skin: Positive for wound.  Neurological: Negative for weakness and numbness.  All other systems reviewed and are negative.    Physical Exam Updated Vital Signs BP 119/89 (BP Location: Left Arm)   Pulse (!) 59   Temp 98.2 F (36.8 C) (Oral)   Resp 16   LMP 11/29/2012   SpO2 97%   Physical Exam Vitals signs and nursing note reviewed.  Constitutional:      Appearance: She is well-developed.  HENT:     Head: Normocephalic and atraumatic.  Eyes:     General: No scleral icterus.       Right eye: No discharge.        Left eye: No discharge.     Conjunctiva/sclera: Conjunctivae normal.  Cardiovascular:     Pulses:          Radial pulses are 2+ on the right side and 2+ on the left side.  Pulmonary:     Effort: Pulmonary effort is normal.  Musculoskeletal:     Comments: Tenderness palpation in the distal aspect of the right index finger.  She has small skin tear noted to the lateral/proximal nailbed of the  right third digit.  No obvious damage to nail.  Flexion/tension of both PIP and DIP intact when held in isolation.  Skin:    General: Skin is warm and dry.     Capillary Refill: Capillary refill takes  less than 2 seconds.     Comments: Small skin tear noted lateral aspect of the nailbed. Good distal cap refill. RUE is not dusky in appearance or cool to touch.  Neurological:     Mental Status: She is alert.     Comments: Sensation intact along major nerve distributions of BUE  Psychiatric:        Speech: Speech normal.        Behavior: Behavior normal.        ED Treatments / Results  Labs (all labs ordered are listed, but only abnormal results are displayed) Labs Reviewed - No data to display  EKG None  Radiology Dg Finger Middle Right  Result Date: 01/22/2019 CLINICAL DATA:  48 year old female with finger injury EXAM: RIGHT MIDDLE FINGER 2+V COMPARISON:  None. FINDINGS: No acute displaced fracture. No subluxation/dislocation. No radiopaque foreign body. Soft tissue swelling on the radial aspect of the proximal interphalangeal joint of the third digit right hand. IMPRESSION: No acute bony abnormality. Soft tissue swelling compatible with given history. Electronically Signed   By: Corrie Mckusick D.O.   On: 01/22/2019 11:56    Procedures Procedures (including critical care time)  Medications Ordered in ED Medications  Tdap (BOOSTRIX) injection 0.5 mL (0.5 mLs Intramuscular Given 01/22/19 1114)  acetaminophen (TYLENOL) tablet 1,000 mg (1,000 mg Oral Given 01/22/19 1114)  bacitracin ointment ( Topical Given 01/22/19 1236)     Initial Impression / Assessment and Plan / ED Course  I have reviewed the triage vital signs and the nursing notes.  Pertinent labs & imaging results that were available during my care of the patient were reviewed by me and considered in my medical decision making (see chart for details).        48 year old female who presents for evaluation of right  middle digit pain after getting it caught in between a door earlier this morning at approximately 8 AM.  She is on blood thinners.  Does not know when her last tetanus shot was.  No numbness/weakness.  On exam, she has tenderness palpation of distal aspect of right third digit.  Flexion/extension of both the PIP and DIP intact with any difficulty.  She has a small skin tear noted lateral/proximal nailbed.  No obvious nail deformity.  Will plan for x-ray for evaluation of any bony abnormality.  We will plan to update tetanus, wound care.  X-ray reviewed.  No acute bony abnormality.  Reevaluation after wound was cleaned.  There is no deep laceration that would require C suturing.  She has a small skin tear noted in the lateral aspect of the finger but does not extend to the nailbed.  No evidence of subungual hematoma.  Discussed treatment options with patient.  Will plan for bacitracin, dressing.  Encouraged at home supportive care measures. At this time, patient exhibits no emergent life-threatening condition that require further evaluation in ED or admission. Patient had ample opportunity for questions and discussion. All patient's questions were answered with full understanding. Strict return precautions discussed. Patient expresses understanding and agreement to plan.   Portions of this note were generated with [employer]. Dictation errors may occur despite best attempts at proofreading.   Final Clinical Impressions(s) / ED Diagnoses   Final diagnoses:  Injury of finger of right hand, initial encounter    ED Discharge Orders    None       Desma Mcgregor 01/22/19 1414    Elnora Morrison, MD 01/22/19 [phone]

## 2019-01-22 NOTE — ED Notes (Signed)
ED Provider at bedside. 

## 2019-01-22 NOTE — ED Notes (Signed)
Patient verbalizes understanding of discharge instructions. Opportunity for questioning and answers were provided. Armband removed by staff, pt discharged from ED.  

## 2019-01-27 ENCOUNTER — Emergency Department (HOSPITAL_COMMUNITY)
Admission: EM | Admit: 2019-01-27 | Discharge: 2019-01-27 | Disposition: A | Discharge status: Home / Self Care | Payer: Worker's Compensation | Attending: Emergency Medicine | Admitting: Emergency Medicine

## 2019-01-27 ENCOUNTER — Encounter (HOSPITAL_COMMUNITY): Payer: Self-pay | Admitting: Emergency Medicine

## 2019-01-27 DIAGNOSIS — Z79899 Other long term (current) drug therapy: Secondary | ICD-10-CM | POA: Diagnosis not present

## 2019-01-27 DIAGNOSIS — E039 Hypothyroidism, unspecified: Secondary | ICD-10-CM | POA: Insufficient documentation

## 2019-01-27 DIAGNOSIS — S61212D Laceration without foreign body of right middle finger without damage to nail, subsequent encounter: Secondary | ICD-10-CM | POA: Diagnosis present

## 2019-01-27 DIAGNOSIS — Z91040 Latex allergy status: Secondary | ICD-10-CM | POA: Insufficient documentation

## 2019-01-27 DIAGNOSIS — J45909 Unspecified asthma, uncomplicated: Secondary | ICD-10-CM | POA: Diagnosis not present

## 2019-01-27 DIAGNOSIS — I10 Essential (primary) hypertension: Secondary | ICD-10-CM | POA: Insufficient documentation

## 2019-01-27 DIAGNOSIS — S6992XD Unspecified injury of left wrist, hand and finger(s), subsequent encounter: Secondary | ICD-10-CM

## 2019-01-27 DIAGNOSIS — F17210 Nicotine dependence, cigarettes, uncomplicated: Secondary | ICD-10-CM | POA: Insufficient documentation

## 2019-01-27 DIAGNOSIS — W231XXD Caught, crushed, jammed, or pinched between stationary objects, subsequent encounter: Secondary | ICD-10-CM | POA: Insufficient documentation

## 2019-01-27 NOTE — ED Provider Notes (Signed)
[facility] EMERGENCY DEPARTMENT Provider Note   CSN: [id number] Arrival date & time: 01/27/19  1319     History   Chief Complaint Chief Complaint  Patient presents with  . Finger Injury    HPI Kristen Moore is a 48 y.o. female.     HPI   48 year old female presents today with finger injury.  Patient notes on 01/22/2019 she slammed her right third finger in a door.  She notes bruising, she was seen in the emergency room had plain films of her finger with no acute fracture.  Patient notes she has some blood along the nail fold and is concerned about this.  She denies any other acute concerns. Past Medical History:  Diagnosis Date  . Abnormal Pap smear    cryo  . Anemia   . Asthma   . Bipolar 1 disorder ([place])   . Cyst of breast    left breast  . DUB (dysfunctional uterine bleeding)   . Fibroids   . Headache(784.0)   . Hypothyroidism   . IBS (irritable bowel syndrome)   . Thyroid dysfunction     Patient Active Problem List   Diagnosis Date Noted  . Elevated blood pressure reading in office without diagnosis of hypertension 08/20/2018  . Hyperthyroidism 08/20/2018  . 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 Rotator cuff impingement syndrome 09/03/2016  . 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 IBS (irritable bowel syndrome) 10/21/2015  . Dyshidrotic hand dermatitis 10/21/2015  . Dyshidrotic foot dermatitis 10/21/2015  . Bright red blood per rectum 10/21/2015  . S/P bilateral oophorectomy 10/21/2015  . Hypothyroidism 10/21/2015  . HLD (hyperlipidemia) 03/18/2015  . Knee pain, right 12/13/2014  . Pelvic pain 12/25/2012  . CHEST PAIN UNSPECIFIED 11/05/2008  . ELECTROCARDIOGRAM, ABNORMAL 11/05/2008    Past Surgical History:  Procedure Laterality Date  . ABDOMINAL HYSTERECTOMY Bilateral 12/25/2012   Procedure: HYSTERECTOMY ABDOMINAL;  Surgeon: Emily Filbert, MD;  Location: [facility];  Service:  Gynecology;  Laterality: Bilateral;  TAH and bilateral salpingectomy  . BILATERAL SALPINGECTOMY Bilateral 12/25/2012   Procedure: BILATERAL SALPINGECTOMY;  Surgeon: Emily Filbert, MD;  Location: [facility];  Service: Gynecology;  Laterality: Bilateral;  . CERVICAL BIOPSY  W/ LOOP ELECTRODE EXCISION    . CHOLECYSTECTOMY    . COLON SURGERY    . DILATION AND CURETTAGE OF UTERUS    . OOPHORECTOMY Bilateral 12/25/2012   Procedure: OOPHORECTOMY;  Surgeon: Emily Filbert, MD;  Location: [facility];  Service: Gynecology;  Laterality: Bilateral;  . TUBAL LIGATION       OB History    Gravida  3   Para  2   Term  1   Preterm  1   AB  1   Living  1     SAB      TAB  1   Ectopic      Multiple      Live Births               Home Medications    Prior to Admission medications   Medication Sig Start Date End Date Taking? Authorizing Provider  atorvastatin (LIPITOR) 40 MG tablet Take 1 tablet (40 mg total) by mouth daily. Patient not taking: Reported on 08/20/2018 04/30/18   Clent Demark, PA-C  BLACK COHOSH PO Take 500 mg by mouth daily.    [provider]  buPROPion (WELLBUTRIN XL) 300 MG 24 hr tablet Take 300  mg by mouth daily.    [provider]  hydrOXYzine (ATARAX/VISTARIL) 10 MG tablet Take 10 mg by mouth 3 (three) times daily as needed.    [provider]  levothyroxine (SYNTHROID, LEVOTHROID) 150 MCG tablet Take 1 tablet (150 mcg total) by mouth daily. 08/21/18   Kerin Perna, NP  lithium carbonate 300 MG capsule Take 300-600 mg by mouth 2 (two) times daily with a meal. Takes 300 mg in the morning at 0800, 300 mg at 1600, and 600 mg at bedtime    [provider]  methocarbamol (ROBAXIN) 500 MG tablet Take 1 tablet (500 mg total) by mouth 2 (two) times daily. 01/03/19   Langston Masker B, PA-C  Multiple Vitamin (MULTIVITAMIN) capsule Take 1 capsule by mouth daily.    [provider]  potassium chloride (K-DUR) 10 MEQ tablet Take 1  tablet (10 mEq total) by mouth 2 (two) times daily. Patient not taking: Reported on 05/29/2018 11/14/17   Clent Demark, PA-C    Family History Family History  Problem Relation Age of Onset  . Heart disease Father   . Cancer Father 6       lung  . Hypertension Mother   . Rheum arthritis Mother   . Fibromyalgia Mother   . COPD Mother   . Sleep apnea Mother   . Breast cancer Maternal Grandmother   . Breast cancer Paternal Aunt     Social History Social History   Tobacco Use  . Smoking status: Current Every Day Smoker    Packs/day: 0.50    Years: 9.00    Pack years: 4.50    Types: Cigarettes  . Smokeless tobacco: Never Used  Substance Use Topics  . Alcohol use: No    Alcohol/week: 0.0 standard drinks  . Drug use: No     Allergies   Latex, Penicillins, Bee venom, Omega-3, and Other   Review of Systems Review of Systems  All other systems reviewed and are negative.    Physical Exam Updated Vital Signs BP (!) 130/98   Pulse 61   Temp 98 F (36.7 C) (Oral)   Resp 16   LMP 11/29/2012   SpO2 98%   Physical Exam Vitals signs and nursing note reviewed.  Constitutional:      Appearance: She is well-developed.  HENT:     Head: Normocephalic and atraumatic.  Eyes:     General: No scleral icterus.       Right eye: No discharge.        Left eye: No discharge.     Conjunctiva/sclera: Conjunctivae normal.     Pupils: Pupils are equal, round, and reactive to light.  Neck:     Musculoskeletal: Normal range of motion.     Vascular: No JVD.     Trachea: No tracheal deviation.  Pulmonary:     Effort: Pulmonary effort is normal.     Breath sounds: No stridor.  Musculoskeletal:     Comments: Minor bruising noted along the ulnar nail fold left third digit, no subungual hematoma, no surrounding erythema or warmth to touch DIP, PIP and MCP full flexion extension intact  Neurological:     Mental Status: She is alert and oriented to person, place, and time.      Coordination: Coordination normal.  Psychiatric:        Behavior: Behavior normal.        Thought Content: Thought content normal.        Judgment: Judgment normal.  ED Treatments / Results  Labs (all labs ordered are listed, but only abnormal results are displayed) Labs Reviewed - No data to display  EKG None  Radiology No results found.  Procedures Procedures (including critical care time)  Medications Ordered in ED Medications - No data to display   Initial Impression / Assessment and Plan / ED Course  I have reviewed the triage vital signs and the nursing notes.  Pertinent labs & imaging results that were available during my care of the patient were reviewed by me and considered in my medical decision making (see chart for details).        48 year old female presents today with bruise to her finger.  This is very minimal with no signs of significant trauma.  No signs of infection. Final Clinical Impressions(s) / ED Diagnoses   Final diagnoses:  Injury of finger of left hand, subsequent encounter    ED Discharge Orders    None       Okey Regal, PA-C 01/27/19 1517    Sherwood Gambler, MD 01/27/19 [phone]

## 2019-01-27 NOTE — Discharge Instructions (Signed)
Please use Tylenol ibuprofen as needed for discomfort

## 2019-01-27 NOTE — ED Notes (Signed)
Patient verbalizes understanding of discharge instructions. Opportunity for questioning and answers were provided. Armband removed by staff, pt discharged from ED.  

## 2019-01-27 NOTE — ED Triage Notes (Signed)
Pt smashed her left middle finger on 8/20 and is concerned due to some redness above the nail bed.

## 2019-02-13 ENCOUNTER — Ambulatory Visit: Payer: Self-pay

## 2019-03-26 ENCOUNTER — Other Ambulatory Visit: Payer: Self-pay | Admitting: Primary Care

## 2019-03-26 DIAGNOSIS — Z1231 Encounter for screening mammogram for malignant neoplasm of breast: Secondary | ICD-10-CM

## 2019-03-31 ENCOUNTER — Other Ambulatory Visit (HOSPITAL_COMMUNITY): Payer: Self-pay | Admitting: *Deleted

## 2019-03-31 DIAGNOSIS — Z1231 Encounter for screening mammogram for malignant neoplasm of breast: Secondary | ICD-10-CM

## 2019-04-06 ENCOUNTER — Ambulatory Visit (INDEPENDENT_AMBULATORY_CARE_PROVIDER_SITE_OTHER): Payer: Self-pay | Admitting: Primary Care

## 2019-04-06 ENCOUNTER — Other Ambulatory Visit: Payer: Self-pay

## 2019-04-06 DIAGNOSIS — R0602 Shortness of breath: Secondary | ICD-10-CM

## 2019-04-06 DIAGNOSIS — R0981 Nasal congestion: Secondary | ICD-10-CM

## 2019-04-06 DIAGNOSIS — R05 Cough: Secondary | ICD-10-CM

## 2019-04-06 DIAGNOSIS — R110 Nausea: Secondary | ICD-10-CM

## 2019-04-06 DIAGNOSIS — R197 Diarrhea, unspecified: Secondary | ICD-10-CM

## 2019-04-06 DIAGNOSIS — U071 COVID-19: Secondary | ICD-10-CM

## 2019-04-06 DIAGNOSIS — M25511 Pain in right shoulder: Secondary | ICD-10-CM

## 2019-04-06 DIAGNOSIS — J029 Acute pharyngitis, unspecified: Secondary | ICD-10-CM

## 2019-04-06 NOTE — Progress Notes (Signed)
Virtual Visit via Telephone Note  I connected with Kristen Moore on 04/06/19 at  4:10 PM EST by telephone and verified that I am speaking with the correct person using two identifiers.   I discussed the limitations, risks, security and privacy concerns of performing an evaluation and management service by telephone and the availability of in person appointments. I also discussed with the patient that there may be a patient responsible charge related to this service. The patient expressed understanding and agreed to proceed.   History of Present Illness: Kristen Moore is having a tele visit since 04/04/2019- sneezing, congested in chest , sore throat , dizzy and difficulty breathing.  Checking temperature ranging from 97-98 degree. She also states that her mother has the same problems but worst. She is complaining that her right shoulder is hurting was tod she had a pinched nerved she was scheduled to see a orthopedics but was told the initial visit was $300. Therefore never followed up on right shoulder pain.   Past Medical History:  Diagnosis Date  . Abnormal Pap smear    cryo  . Anemia   . Asthma   . Bipolar 1 disorder ([name])   . Cyst of breast    left breast  . DUB (dysfunctional uterine bleeding)   . Fibroids   . Headache(784.0)   . Hypothyroidism   . IBS (irritable bowel syndrome)   . Thyroid dysfunction    Current Outpatient Medications:  .  BLACK COHOSH PO, Take 500 mg by mouth daily., Disp: , Rfl:  .  atorvastatin (LIPITOR) 40 MG tablet, Take 1 tablet (40 mg total) by mouth daily. (Patient not taking: Reported on 08/20/2018), Disp: 90 tablet, Rfl: 1 .  buPROPion (WELLBUTRIN XL) 300 MG 24 hr tablet, Take 300 mg by mouth daily., Disp: , Rfl:  .  hydrOXYzine (ATARAX/VISTARIL) 10 MG tablet, Take 10 mg by mouth 3 (three) times daily as needed., Disp: , Rfl:  .  levothyroxine (SYNTHROID, LEVOTHROID) 150 MCG tablet, Take 1 tablet (150 mcg total) by mouth daily. (Patient  not taking: Reported on 04/06/2019), Disp: 90 tablet, Rfl: 1 .  lithium carbonate 300 MG capsule, Take 300-600 mg by mouth 2 (two) times daily with a meal. Takes 300 mg in the morning at 0800, 300 mg at 1600, and 600 mg at bedtime, Disp: , Rfl:  .  Multiple Vitamin (MULTIVITAMIN) capsule, Take 1 capsule by mouth daily., Disp: , Rfl:  .  potassium chloride (K-DUR) 10 MEQ tablet, Take 1 tablet (10 mEq total) by mouth 2 (two) times daily. (Patient not taking: Reported on 05/29/2018), Disp: 60 tablet, Rfl: 3  Observations/Objective: Review of Systems  Constitutional: Positive for malaise/fatigue.  HENT: Positive for congestion and sore throat.        Change in taste  Sore throat   Respiratory: Positive for cough and shortness of breath.   Gastrointestinal: Positive for constipation, diarrhea and nausea.  Neurological: Positive for dizziness.    Assessment and Plan: Kristen Moore was seen today for shoulder pain and not feeling well since 04/04/2019   Diagnoses and all orders for this visit:  Pain in joint of right shoulder History of she has noticed increasing pain especially because she is right handed. Advised to fill out [facility] financial packet to find out if she qualifies and I can refer her back to ortho for re-valuation.  Patient complaints of right  shoulder pain. This is evaluated as a personal injury. The pain is described as throbbing.  The onset of the pain was gradual .  The pain occurs when she is using her left arm reaching or picking objects up. 6/10 pain level..  Location is right shoulder no know knowledge of dislocation. Symptoms are diminisohed by rest /heat.  Limited activities include: raising arm and lifting.  COVID-19 Advised patient and mother it maybe in there best interest to go to [facility] for free testing from 8:30 - 3:30 she described positive symptoms that can be COVID, cold cough with congestion. Concerning factor was shortness of breath, sore throat and GI symptoms. Patient  voiced understanding and plans on being tested tomorrow.   Follow Up Instructions:    I discussed the assessment and treatment plan with the patient. The patient was provided an opportunity to ask questions and all were answered. The patient agreed with the plan and demonstrated an understanding of the instructions.   The patient was advised to call back or seek an in-person evaluation if the symptoms worsen or if the condition fails to improve as anticipated.  I provided 15 minutes of non-face-to-face time during this encounter.   Kerin Perna, NP

## 2019-05-07 ENCOUNTER — Other Ambulatory Visit: Payer: Self-pay

## 2019-05-07 ENCOUNTER — Ambulatory Visit (INDEPENDENT_AMBULATORY_CARE_PROVIDER_SITE_OTHER): Payer: Self-pay | Admitting: Primary Care

## 2019-05-07 ENCOUNTER — Encounter (INDEPENDENT_AMBULATORY_CARE_PROVIDER_SITE_OTHER): Payer: Self-pay | Admitting: Primary Care

## 2019-05-07 VITALS — BP 130/83 | HR 59 | Temp 97.3°F | Ht 64.0 in | Wt 209.0 lb

## 2019-05-07 DIAGNOSIS — F319 Bipolar disorder, unspecified: Secondary | ICD-10-CM | POA: Insufficient documentation

## 2019-05-07 DIAGNOSIS — E237 Disorder of pituitary gland, unspecified: Secondary | ICD-10-CM

## 2019-05-07 DIAGNOSIS — H9202 Otalgia, left ear: Secondary | ICD-10-CM

## 2019-05-07 DIAGNOSIS — E039 Hypothyroidism, unspecified: Secondary | ICD-10-CM

## 2019-05-07 DIAGNOSIS — F17210 Nicotine dependence, cigarettes, uncomplicated: Secondary | ICD-10-CM

## 2019-05-07 NOTE — Progress Notes (Signed)
Acute Office Visit  Subjective:    Patient ID: Kristen Moore, female    DOB: 10-Sep-1970, 48 y.o.   MRN: [id number]  Chief Complaint  Patient presents with  . Ear Pain    left ear     HPI Patient is in today for ear pain. She has not been taking thyroid medication due to lack of money. She was positive for COVID + 04/14/2019 and 04/18/2019 negative COVID but daughter has different people coming in the house not using proactive mask,  Social distance and hand washing. Patient has not had behavior health medications talks about family will not help her and she needs her medication suggested applying for medicaid try calling social services for assistance.  Past Medical History:  Diagnosis Date  . Abnormal Pap smear    cryo  . Anemia   . Asthma   . Bipolar 1 disorder ([place])   . Cyst of breast    left breast  . DUB (dysfunctional uterine bleeding)   . Fibroids   . Headache(784.0)   . Hypothyroidism   . IBS (irritable bowel syndrome)   . Thyroid dysfunction     Past Surgical History:  Procedure Laterality Date  . ABDOMINAL HYSTERECTOMY Bilateral 12/25/2012   Procedure: HYSTERECTOMY ABDOMINAL;  Surgeon: Emily Filbert, MD;  Location: [facility];  Service: Gynecology;  Laterality: Bilateral;  TAH and bilateral salpingectomy  . BILATERAL SALPINGECTOMY Bilateral 12/25/2012   Procedure: BILATERAL SALPINGECTOMY;  Surgeon: Emily Filbert, MD;  Location: [facility];  Service: Gynecology;  Laterality: Bilateral;  . CERVICAL BIOPSY  W/ LOOP ELECTRODE EXCISION    . CHOLECYSTECTOMY    . COLON SURGERY    . DILATION AND CURETTAGE OF UTERUS    . OOPHORECTOMY Bilateral 12/25/2012   Procedure: OOPHORECTOMY;  Surgeon: Emily Filbert, MD;  Location: [facility];  Service: Gynecology;  Laterality: Bilateral;  . TUBAL LIGATION      Family History  Problem Relation Age of Onset  . Heart disease Father   . Cancer Father 41       lung  . Hypertension Mother   . Rheum arthritis Mother   . Fibromyalgia Mother    . COPD Mother   . Sleep apnea Mother   . Thyroid disease Mother   . Breast cancer Maternal Grandmother   . Breast cancer Paternal Aunt     Social History   Socioeconomic History  . Marital status: Single    Spouse name: Not on file  . Number of children: Not on file  . Years of education: Not on file  . Highest education level: Associate degree: occupational, Hotel manager, or vocational program  Occupational History  . Not on file  Tobacco Use  . Smoking status: Current Every Day Smoker    Packs/day: 0.50    Years: 9.00    Pack years: 4.50    Types: Cigarettes  . Smokeless tobacco: Never Used  Substance and Sexual Activity  . Alcohol use: No    Alcohol/week: 0.0 standard drinks  . Drug use: No  . Sexual activity: Not Currently    Birth control/protection: Surgical  Other Topics Concern  . Not on file  Social History Narrative  . Not on file   Social Determinants of Health   Financial Resource Strain:   . Difficulty of Paying Living Expenses: Not on file  Food Insecurity:   . Worried About Charity fundraiser in the Last Year: Not on file  . Ran Out of Food  in the Last Year: Not on file  Transportation Needs: Unmet Transportation Needs  . Lack of Transportation (Medical): Yes  . Lack of Transportation (Non-Medical): Not on file  Physical Activity:   . Days of Exercise per Week: Not on file  . Minutes of Exercise per Session: Not on file  Stress:   . Feeling of Stress : Not on file  Social Connections:   . Frequency of Communication with Friends and Family: Not on file  . Frequency of Social Gatherings with Friends and Family: Not on file  . Attends Religious Services: Not on file  . Active Member of Clubs or Organizations: Not on file  . Attends Archivist Meetings: Not on file  . Marital Status: Not on file  Intimate Partner Violence:   . Fear of Current or Ex-Partner: Not on file  . Emotionally Abused: Not on file  . Physically Abused: Not on file   . Sexually Abused: Not on file    Outpatient Medications Prior to Visit  Medication Sig Dispense Refill  . atorvastatin (LIPITOR) 40 MG tablet Take 1 tablet (40 mg total) by mouth daily. (Patient not taking: Reported on 08/20/2018) 90 tablet 1  . BLACK COHOSH PO Take 500 mg by mouth daily.    Marland Kitchen buPROPion (WELLBUTRIN XL) 300 MG 24 hr tablet Take 300 mg by mouth daily.    . hydrOXYzine (ATARAX/VISTARIL) 10 MG tablet Take 10 mg by mouth 3 (three) times daily as needed.    . lithium carbonate 300 MG capsule Take 300-600 mg by mouth 2 (two) times daily with a meal. Takes 300 mg in the morning at 0800, 300 mg at 1600, and 600 mg at bedtime    . Multiple Vitamin (MULTIVITAMIN) capsule Take 1 capsule by mouth daily.    . potassium chloride (K-DUR) 10 MEQ tablet Take 1 tablet (10 mEq total) by mouth 2 (two) times daily. (Patient not taking: Reported on 05/29/2018) 60 tablet 3  . levothyroxine (SYNTHROID, LEVOTHROID) 150 MCG tablet Take 1 tablet (150 mcg total) by mouth daily. (Patient not taking: Reported on 04/06/2019) 90 tablet 1   No facility-administered medications prior to visit.      Review of Systems  HENT: Positive for ear discharge and ear pain.   Respiratory: Positive for cough and sputum production.   Musculoskeletal: Positive for back pain.       After standing for hours at work.  Psychiatric/Behavioral: Positive for depression.  All other systems reviewed and are negative.      Objective:    Physical Exam  Constitutional: She is oriented to person, place, and time. She appears well-developed and well-nourished.  HENT:  Left Ear: External ear normal.  Neck: Neck supple.  Cardiovascular: Normal rate and regular rhythm.  Pulmonary/Chest: Effort normal and breath sounds normal.  Abdominal: Soft. Bowel sounds are normal.  Musculoskeletal: Normal range of motion.  Neurological: She is oriented to person, place, and time.  Psychiatric: She has a normal mood and affect. Her  behavior is normal. Thought content normal.    BP 130/83 (BP Location: Right Arm, Patient Position: Sitting, Cuff Size: Large)   Pulse (!) 59   Temp (!) 97.3 F (36.3 C) (Temporal)   Ht 5\' 4"  (1.626 m)   Wt 209 lb (94.8 kg)   LMP 11/29/2012   SpO2 93%   BMI 35.87 kg/m  Wt Readings from Last 3 Encounters:  05/07/19 209 lb (94.8 kg)  01/03/19 200 lb (90.7 kg)  08/20/18 202  lb 12.8 oz (92 kg)    Health Maintenance Due  Topic Date Due  . INFLUENZA VACCINE  01/03/2019    There are no preventive care reminders to display for this patient.   Lab Results  Component Value Date   TSH 0.615 08/20/2018   Lab Results  Component Value Date   WBC 7.0 05/29/2018   HGB 15.5 05/29/2018   HCT 45.0 05/29/2018   MCV 93 05/29/2018   PLT 228 05/29/2018   Lab Results  Component Value Date   NA 141 05/29/2018   K 4.7 05/29/2018   CO2 23 05/29/2018   GLUCOSE 87 05/29/2018   BUN 11 05/29/2018   CREATININE 0.87 05/29/2018   BILITOT 0.3 05/29/2018   ALKPHOS 79 05/29/2018   AST 17 05/29/2018   ALT 24 05/29/2018   PROT 7.0 05/29/2018   ALBUMIN 4.9 05/29/2018   CALCIUM 10.3 (H) 05/29/2018   ANIONGAP 9 08/09/2017   Lab Results  Component Value Date   CHOL 224 (H) 04/18/2018   Lab Results  Component Value Date   HDL 44 04/18/2018   Lab Results  Component Value Date   LDLCALC 124 (H) 04/18/2018   Lab Results  Component Value Date   TRIG 280 (H) 04/18/2018   Lab Results  Component Value Date   CHOLHDL 5.1 04/18/2018   Lab Results  Component Value Date   HGBA1C 4.7 (L) 04/18/2018       Assessment & Plan:  Kristen Moore was seen today for ear pain.  Diagnoses and all orders for this visit:  Hypothyroidism, unspecified type Not taking medication unable to afford states no family to help here. Asked the cost of her cigarettes which is more than her levoxyl.  Ear pain, left Nothing abnormal seen pearly gray tympanic membrane, no trauma , ear drum visualization  normal  Bipolar depression ([place]) Not taking medication followed by behavior during this visit she was manic stressed she needs to make an appointment and follow up . Stress importance of stopping medication adverse affects.  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    Kerin Perna, NP

## 2019-05-07 NOTE — Patient Instructions (Signed)

## 2019-05-11 ENCOUNTER — Other Ambulatory Visit (INDEPENDENT_AMBULATORY_CARE_PROVIDER_SITE_OTHER): Payer: Self-pay | Admitting: Primary Care

## 2019-05-11 DIAGNOSIS — E039 Hypothyroidism, unspecified: Secondary | ICD-10-CM

## 2019-05-19 ENCOUNTER — Encounter (HOSPITAL_COMMUNITY): Payer: Self-pay

## 2019-05-19 ENCOUNTER — Ambulatory Visit: Payer: Self-pay

## 2019-05-19 ENCOUNTER — Other Ambulatory Visit: Payer: Self-pay

## 2019-05-19 ENCOUNTER — Ambulatory Visit (HOSPITAL_COMMUNITY)
Admission: RE | Admit: 2019-05-19 | Discharge: 2019-05-19 | Disposition: A | Discharge status: Home / Self Care | Payer: Self-pay | Source: Ambulatory Visit | Attending: Obstetrics and Gynecology | Admitting: Obstetrics and Gynecology

## 2019-05-19 ENCOUNTER — Other Ambulatory Visit (HOSPITAL_COMMUNITY): Payer: Self-pay | Admitting: *Deleted

## 2019-05-19 ENCOUNTER — Other Ambulatory Visit (HOSPITAL_COMMUNITY): Payer: Self-pay | Admitting: Obstetrics and Gynecology

## 2019-05-19 DIAGNOSIS — N644 Mastodynia: Secondary | ICD-10-CM | POA: Insufficient documentation

## 2019-05-19 DIAGNOSIS — Z1239 Encounter for other screening for malignant neoplasm of breast: Secondary | ICD-10-CM | POA: Insufficient documentation

## 2019-05-19 NOTE — Progress Notes (Signed)
Complaints of bilateral outer breast pain x 3 months that is constant. Patient rates the pain at a 6 out of 10.  Pap Smear: Pap smear not completed today. Last Pap smear was 06/06/2011 at [facility] and normal. Per patient has a history of an abnormal Pap smear in 1992 that required a LEEP for follow-up. Per patient all Pap smears have been normal since LEEP. Patient has a history of a hysterectomy 12/25/2012 due to fibroids. Patient no longer needs Pap smears due to her history of a hysterectomy for benign reasons per BCCCP and ACOG guidelines. Last Pap smear result is in EPIC.  Physical exam: Breasts Breasts symmetrical. No skin abnormalities bilateral breasts. No nipple retraction bilateral breasts. No nipple discharge bilateral breasts. No lymphadenopathy. No lumps palpated bilateral breasts. Complaints of bilateral outer breast and axillary pain on exam. Referred patient to the [facility] for a diagnostic mammogram. Appointment scheduled for Tuesday, May 26, 2019 at 1310.        Pelvic/Bimanual No Pap smear completed today since patient has a history of a hysterectomy for benign reasons. Pap smear not indicated per BCCCP guidelines.  Smoking History: Patientis a current smoker. Discussed smoking cessation with patient. Referred patient to the [facility] Quitline and gave resources to free smoking cessation classes at [facility] [place].  Patient Navigation: Patient education provided. Access to services provided for patient through [facility] program. Spanish interpreter provided.   Breast and Cervical Cancer Risk Assessment: Patient has a family history of her maternal grandmother and a paternal aunt having breast cancer. Patient has no known genetic mutations or history of radiation treatment to the chest before age 73. Per patient has a history of cervical dysplasia. Patient has no history of being immunocompromised or DES exposure in-utero.  Risk Assessment    Risk Scores       05/19/2019 04/17/2018   Last edited by: Loletta Parish, RN Armond Hang, LPN   5-year risk: 0.7 % 0.6 %   Lifetime risk: 7.6 % 7.7 %

## 2019-05-19 NOTE — Patient Instructions (Signed)
Explained breast self awareness with [name]. Patient did not need a Pap smear today due to patient has a history of a hysterectomy for benign reasons. Let her know that she doesn't need any further Pap smears due to her history of a hysterectomy for benign reasons. Referred patient to the [facility] for a diagnostic mammogram. Appointment scheduled for Tuesday, May 26, 2019 at 1310. Patient aware of appointment and will be there. Leonie Man Vanacker verbalized understanding.  Zenita Kister, Arvil Chaco, RN 3:50 PM

## 2019-05-20 ENCOUNTER — Telehealth: Payer: Self-pay | Admitting: Licensed Clinical Social Worker

## 2019-05-20 NOTE — Telephone Encounter (Signed)
Pt cld to schedule a genetic counseling appt on 12/29 at 1pm. Pt has been scheduled for a mychart video visit. I verified that she had access to her MyChart and verified her phone number as well.

## 2019-05-26 ENCOUNTER — Other Ambulatory Visit: Payer: Self-pay

## 2019-05-26 ENCOUNTER — Ambulatory Visit
Admission: RE | Admit: 2019-05-26 | Discharge: 2019-05-26 | Disposition: A | Discharge status: Home / Self Care | Payer: No Typology Code available for payment source | Source: Ambulatory Visit | Attending: Obstetrics and Gynecology | Admitting: Obstetrics and Gynecology

## 2019-05-26 ENCOUNTER — Ambulatory Visit: Payer: Self-pay

## 2019-05-26 DIAGNOSIS — N644 Mastodynia: Secondary | ICD-10-CM

## 2019-06-02 ENCOUNTER — Telehealth: Payer: Self-pay | Admitting: Genetic Counselor

## 2019-06-02 ENCOUNTER — Inpatient Hospital Stay: Payer: Self-pay | Admitting: Licensed Clinical Social Worker

## 2019-06-02 NOTE — Telephone Encounter (Signed)
Pt cld to r/s her genetic counseling appt to see Clint Guy for a mychart video visit on 12/31 at 3pm.

## 2019-06-04 ENCOUNTER — Telehealth: Payer: Self-pay | Admitting: Genetic Counselor

## 2019-06-08 ENCOUNTER — Ambulatory Visit (INDEPENDENT_AMBULATORY_CARE_PROVIDER_SITE_OTHER): Payer: No Typology Code available for payment source | Admitting: Primary Care

## 2019-06-30 ENCOUNTER — Other Ambulatory Visit: Payer: Self-pay

## 2019-06-30 ENCOUNTER — Encounter (INDEPENDENT_AMBULATORY_CARE_PROVIDER_SITE_OTHER): Payer: Self-pay | Admitting: Primary Care

## 2019-06-30 DIAGNOSIS — E038 Other specified hypothyroidism: Secondary | ICD-10-CM

## 2019-06-30 DIAGNOSIS — R030 Elevated blood-pressure reading, without diagnosis of hypertension: Secondary | ICD-10-CM

## 2019-06-30 DIAGNOSIS — E783 Hyperchylomicronemia: Secondary | ICD-10-CM

## 2019-06-30 DIAGNOSIS — E7841 Elevated Lipoprotein(a): Secondary | ICD-10-CM

## 2019-07-01 LAB — COMPREHENSIVE METABOLIC PANEL
ALT: 20 IU/L (ref 0–32)
AST: 20 IU/L (ref 0–40)
Albumin/Globulin Ratio: 1.9 (ref 1.2–2.2)
Albumin: 4.4 g/dL (ref 3.8–4.8)
Alkaline Phosphatase: 75 IU/L (ref 39–117)
BUN/Creatinine Ratio: 20 (ref 9–23)
BUN: 14 mg/dL (ref 6–24)
Bilirubin Total: 0.2 mg/dL (ref 0.0–1.2)
CO2: 24 mmol/L (ref 20–29)
Calcium: 9.7 mg/dL (ref 8.7–10.2)
Chloride: 103 mmol/L (ref 96–106)
Creatinine, Ser: 0.69 mg/dL (ref 0.57–1.00)
GFR calc Af Amer: 121 mL/min/{1.73_m2} (ref >59)
GFR calc non Af Amer: 105 mL/min/{1.73_m2} (ref >59)
Globulin, Total: 2.3 g/dL (ref 1.5–4.5)
Glucose: 108 mg/dL — ABNORMAL HIGH (ref 65–99)
Potassium: 3.9 mmol/L (ref 3.5–5.2)
Sodium: 141 mmol/L (ref 134–144)
Total Protein: 6.7 g/dL (ref 6.0–8.5)

## 2019-07-01 LAB — LIPID PANEL
Chol/HDL Ratio: 5.3 ratio — ABNORMAL HIGH (ref 0.0–4.4)
Cholesterol, Total: 242 mg/dL — ABNORMAL HIGH (ref 100–199)
HDL: 46 mg/dL (ref >39)
LDL Chol Calc (NIH): 145 mg/dL — ABNORMAL HIGH (ref 0–99)
Triglycerides: 279 mg/dL — ABNORMAL HIGH (ref 0–149)
VLDL Cholesterol Cal: 51 mg/dL — ABNORMAL HIGH (ref 5–40)

## 2019-07-01 LAB — CBC WITH DIFFERENTIAL/PLATELET
Basophils Absolute: 0 10*3/uL (ref 0.0–0.2)
Basos: 1 %
EOS (ABSOLUTE): 0.1 10*3/uL (ref 0.0–0.4)
Eos: 2 %
Hematocrit: 41.9 % (ref 34.0–46.6)
Hemoglobin: 14.8 g/dL (ref 11.1–15.9)
Immature Grans (Abs): 0 10*3/uL (ref 0.0–0.1)
Immature Granulocytes: 0 %
Lymphocytes Absolute: 1.5 10*3/uL (ref 0.7–3.1)
Lymphs: 29 %
MCH: 33.3 pg — ABNORMAL HIGH (ref 26.6–33.0)
MCHC: 35.3 g/dL (ref 31.5–35.7)
MCV: 94 fL (ref 79–97)
Monocytes Absolute: 0.3 10*3/uL (ref 0.1–0.9)
Monocytes: 7 %
Neutrophils Absolute: 3 10*3/uL (ref 1.4–7.0)
Neutrophils: 61 %
Platelets: 193 10*3/uL (ref 150–450)
RBC: 4.45 x10E6/uL (ref 3.77–5.28)
RDW: 12.9 % (ref 11.7–15.4)
WBC: 5 10*3/uL (ref 3.4–10.8)

## 2019-07-01 LAB — TSH+FREE T4
Free T4: 1.43 ng/dL (ref 0.82–1.77)
TSH: 0.485 u[IU]/mL (ref 0.450–4.500)

## 2019-07-03 ENCOUNTER — Other Ambulatory Visit (INDEPENDENT_AMBULATORY_CARE_PROVIDER_SITE_OTHER): Payer: Self-pay | Admitting: Primary Care

## 2019-07-03 DIAGNOSIS — E7841 Elevated Lipoprotein(a): Secondary | ICD-10-CM

## 2019-07-03 MED ORDER — ATORVASTATIN CALCIUM 40 MG PO TABS: 40.0000 mg | ORAL_TABLET | Freq: Every day | ORAL | 1 refills | Status: DC

## 2019-07-20 ENCOUNTER — Other Ambulatory Visit (INDEPENDENT_AMBULATORY_CARE_PROVIDER_SITE_OTHER): Payer: Self-pay | Admitting: Primary Care

## 2019-07-20 DIAGNOSIS — E039 Hypothyroidism, unspecified: Secondary | ICD-10-CM

## 2019-07-20 NOTE — Telephone Encounter (Signed)
Sent to PCP ?

## 2019-09-01 ENCOUNTER — Ambulatory Visit (INDEPENDENT_AMBULATORY_CARE_PROVIDER_SITE_OTHER): Payer: Self-pay | Admitting: Primary Care

## 2019-09-01 ENCOUNTER — Other Ambulatory Visit: Payer: Self-pay

## 2019-09-01 ENCOUNTER — Encounter (INDEPENDENT_AMBULATORY_CARE_PROVIDER_SITE_OTHER): Payer: Self-pay | Admitting: Primary Care

## 2019-09-01 VITALS — BP 126/83 | HR 76 | Temp 97.3°F | Ht 64.0 in | Wt 213.8 lb

## 2019-09-01 DIAGNOSIS — K921 Melena: Secondary | ICD-10-CM

## 2019-09-01 DIAGNOSIS — R519 Headache, unspecified: Secondary | ICD-10-CM

## 2019-09-01 DIAGNOSIS — T7411XA Adult physical abuse, confirmed, initial encounter: Secondary | ICD-10-CM

## 2019-09-01 DIAGNOSIS — G8929 Other chronic pain: Secondary | ICD-10-CM

## 2019-09-01 DIAGNOSIS — E039 Hypothyroidism, unspecified: Secondary | ICD-10-CM

## 2019-09-01 MED ORDER — LEVOTHYROXINE SODIUM 150 MCG PO TABS: 150.0000 ug | ORAL_TABLET | Freq: Every day | ORAL | 0 refills | Status: DC

## 2019-09-01 NOTE — Progress Notes (Signed)
Soreness on left side of head- not sore to touch  Every time she eats she gets sick- has diarrhea and stomach pain

## 2019-09-01 NOTE — Patient Instructions (Signed)
Analgesic Rebound Headache An analgesic rebound headache, sometimes called a medication overuse headache, is a headache that comes after pain medicine (analgesic) taken to treat the original (primary) headache has worn off. Any type of primary headache can return as a rebound headache if a person regularly takes analgesics more than three times a week to treat it. The types of primary headaches that are commonly associated with rebound headaches include:  Migraines.  Headaches that arise from tense muscles in the head and neck area (tension headaches).  Headaches that develop and happen again (recur) on one side of the head and around the eye (cluster headaches). If rebound headaches continue, they become chronic daily headaches. What are the causes? This condition may be caused by frequent use of:  Over-the-counter medicines such as aspirin, ibuprofen, and acetaminophen.  Sinus relief medicines and other medicines that contain caffeine.  Narcotic pain medicines such as codeine and oxycodone. What are the signs or symptoms? The symptoms of a rebound headache are the same as the symptoms of the original headache. Some of the symptoms of specific types of headaches include: Migraine headache  Pulsing or throbbing pain on one or both sides of the head.  Severe pain that interferes with daily activities.  Pain that is worsened by physical activity.  Nausea, vomiting, or both.  Pain with exposure to bright light, loud noises, or strong smells.  General sensitivity to bright light, loud noises, or strong smells.  Visual changes.  Numbness of one or both arms. Tension headache  Pressure around the head.  Dull, aching head pain.  Pain felt over the front and sides of the head.  Tenderness in the muscles of the head, neck, and shoulders. Cluster headache  Severe pain that begins in or around one eye or temple.  Redness and tearing in the eye on the same side as the  pain.  Droopy or swollen eyelid.  One-sided head pain.  Nausea.  Runny nose.  Sweaty, pale facial skin.  Restlessness. How is this diagnosed? This condition is diagnosed by:  Reviewing your medical history. This includes the nature of your primary headaches.  Reviewing the types of pain medicines that you have been using to treat your headaches and how often you take them. How is this treated? This condition may be treated or managed by:  Discontinuing frequent use of the analgesic medicine. Doing this may worsen your headaches at first, but the pain should eventually become more manageable, less frequent, and less severe.  Seeing a headache specialist. He or she may be able to help you manage your headaches and help make sure there is not another cause of the headaches.  Using methods of stress relief, such as acupuncture, counseling, biofeedback, and massage. Talk with your health care provider about which methods might be good for you. Follow these instructions at home:  Take over-the-counter and prescription medicines only as told by your health care provider.  Stop the repeated use of pain medicine as told by your health care provider. Stopping can be difficult. Carefully follow instructions from your health care provider.  Avoid triggers that are known to cause your primary headaches.  Keep all follow-up visits as told by your health care provider. This is important. Contact a health care provider if:  You continue to experience headaches after following treatments that your health care provider recommended. Get help right away if:  You develop new headache pain.  You develop headache pain that is different than what you have   experienced in the past.  You develop numbness or tingling in your arms or legs.  You develop changes in your speech or vision. This information is not intended to replace advice given to you by your health care provider. Make sure you  discuss any questions you have with your health care provider. Document Revised: 05/03/2017 Document Reviewed: 10/24/2015 Elsevier Patient Education  2020 Elsevier Inc.  
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
## 2019-12-02 ENCOUNTER — Other Ambulatory Visit (INDEPENDENT_AMBULATORY_CARE_PROVIDER_SITE_OTHER): Payer: No Typology Code available for payment source

## 2019-12-17 ENCOUNTER — Other Ambulatory Visit: Payer: Self-pay

## 2019-12-17 ENCOUNTER — Ambulatory Visit (INDEPENDENT_AMBULATORY_CARE_PROVIDER_SITE_OTHER): Payer: No Payment, Other | Admitting: Clinical

## 2019-12-17 DIAGNOSIS — F331 Major depressive disorder, recurrent, moderate: Secondary | ICD-10-CM | POA: Diagnosis not present
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
## 2019-12-23 ENCOUNTER — Ambulatory Visit (INDEPENDENT_AMBULATORY_CARE_PROVIDER_SITE_OTHER): Payer: Self-pay | Admitting: Primary Care

## 2019-12-23 ENCOUNTER — Encounter (INDEPENDENT_AMBULATORY_CARE_PROVIDER_SITE_OTHER): Payer: Self-pay

## 2019-12-31 ENCOUNTER — Telehealth (HOSPITAL_COMMUNITY): Payer: No Payment, Other | Admitting: Psychiatric/Mental Health

## 2019-12-31 ENCOUNTER — Other Ambulatory Visit: Payer: Self-pay

## 2019-12-31 ENCOUNTER — Other Ambulatory Visit (INDEPENDENT_AMBULATORY_CARE_PROVIDER_SITE_OTHER): Payer: Self-pay | Admitting: Primary Care

## 2019-12-31 DIAGNOSIS — E039 Hypothyroidism, unspecified: Secondary | ICD-10-CM

## 2020-01-13 ENCOUNTER — Telehealth (HOSPITAL_COMMUNITY): Payer: No Payment, Other

## 2020-01-18 ENCOUNTER — Ambulatory Visit (INDEPENDENT_AMBULATORY_CARE_PROVIDER_SITE_OTHER): Payer: No Payment, Other | Admitting: Licensed Clinical Social Worker

## 2020-01-18 ENCOUNTER — Other Ambulatory Visit: Payer: Self-pay

## 2020-01-18 DIAGNOSIS — F418 Other specified anxiety disorders: Secondary | ICD-10-CM
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
## 2020-02-02 ENCOUNTER — Encounter (HOSPITAL_COMMUNITY): Payer: Self-pay | Admitting: Psychiatry

## 2020-02-02 ENCOUNTER — Other Ambulatory Visit (INDEPENDENT_AMBULATORY_CARE_PROVIDER_SITE_OTHER): Payer: Self-pay | Admitting: Primary Care

## 2020-02-02 ENCOUNTER — Other Ambulatory Visit: Payer: Self-pay

## 2020-02-02 ENCOUNTER — Telehealth (INDEPENDENT_AMBULATORY_CARE_PROVIDER_SITE_OTHER): Payer: No Payment, Other | Admitting: Psychiatry

## 2020-02-02 ENCOUNTER — Other Ambulatory Visit (HOSPITAL_COMMUNITY): Payer: Self-pay | Admitting: *Deleted

## 2020-02-02 DIAGNOSIS — E7841 Elevated Lipoprotein(a): Secondary | ICD-10-CM

## 2020-02-02 DIAGNOSIS — E039 Hypothyroidism, unspecified: Secondary | ICD-10-CM

## 2020-02-02 DIAGNOSIS — F319 Bipolar disorder, unspecified: Secondary | ICD-10-CM

## 2020-02-02 DIAGNOSIS — F411 Generalized anxiety disorder: Secondary | ICD-10-CM

## 2020-02-02 DIAGNOSIS — K921 Melena: Secondary | ICD-10-CM

## 2020-02-02 MED ORDER — LEVOTHYROXINE SODIUM 150 MCG PO TABS: 150.0000 ug | ORAL_TABLET | Freq: Every day | ORAL | 1 refills | Status: DC

## 2020-02-02 MED ORDER — ATORVASTATIN CALCIUM 40 MG PO TABS: 40.0000 mg | ORAL_TABLET | Freq: Every day | ORAL | 1 refills | Status: DC

## 2020-02-02 MED ORDER — HYDROXYZINE HCL 10 MG PO TABS: 10.0000 mg | ORAL_TABLET | Freq: Three times a day (TID) | ORAL | 2 refills | Status: DC | PRN

## 2020-02-02 MED ORDER — LITHIUM CARBONATE 300 MG PO CAPS: 300.0000 mg | ORAL_CAPSULE | Freq: Two times a day (BID) | ORAL | 2 refills | Status: DC

## 2020-02-02 MED ORDER — BUPROPION HCL ER (XL) 300 MG PO TB24: 300.0000 mg | ORAL_TABLET | Freq: Every day | ORAL | 2 refills | Status: DC
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
## 2020-02-02 NOTE — Telephone Encounter (Signed)
Requested Prescriptions  Pending Prescriptions Disp Refills  . atorvastatin (LIPITOR) 40 MG tablet 90 tablet 1    Sig: Take 1 tablet (40 mg total) by mouth daily.     Cardiovascular:  Antilipid - Statins Failed - 02/02/2020  4:33 PM      Failed - Total Cholesterol in normal range and within 360 days    Cholesterol, Total  Date Value Ref Range Status  06/30/2019 242 (H) 100 - 199 mg/dL Final         Failed - LDL in normal range and within 360 days    LDL Chol Calc (NIH)  Date Value Ref Range Status  06/30/2019 145 (H) 0 - 99 mg/dL Final         Failed - Triglycerides in normal range and within 360 days    Triglycerides  Date Value Ref Range Status  06/30/2019 279 (H) 0 - 149 mg/dL Final         Passed - HDL in normal range and within 360 days    HDL  Date Value Ref Range Status  06/30/2019 46 >39 mg/dL Final         Passed - Patient is not pregnant      Passed - Valid encounter within last 12 months    Recent Outpatient Visits          5 months ago Blood in stool   [place], Michelle P, NP   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 levothyroxine (SYNTHROID) 150 MCG tablet 90 tablet 1    Sig: Take 1 tablet (150 mcg total) by mouth daily.     Endocrinology:  Hypothyroid Agents Failed - 02/02/2020  4:33 PM      Failed - TSH needs to be rechecked within 3 months after an abnormal result. Refill until TSH is due.      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

## 2020-02-02 NOTE — Telephone Encounter (Signed)
Pt needs refill on her  Levothyroxine 150 Atorvastatin 40 mg  [employer]  CB#  [phone]

## 2020-02-13 ENCOUNTER — Telehealth (HOSPITAL_COMMUNITY): Payer: No Payment, Other | Admitting: Adult Health

## 2020-03-11 ENCOUNTER — Ambulatory Visit (INDEPENDENT_AMBULATORY_CARE_PROVIDER_SITE_OTHER): Payer: Self-pay

## 2020-03-11 ENCOUNTER — Other Ambulatory Visit: Payer: Self-pay

## 2020-03-11 ENCOUNTER — Encounter (INDEPENDENT_AMBULATORY_CARE_PROVIDER_SITE_OTHER): Payer: Self-pay

## 2020-03-11 VITALS — Wt 210.0 lb

## 2020-03-11 DIAGNOSIS — Z23 Encounter for immunization: Secondary | ICD-10-CM

## 2020-03-15 ENCOUNTER — Other Ambulatory Visit: Payer: Self-pay

## 2020-03-15 ENCOUNTER — Ambulatory Visit (INDEPENDENT_AMBULATORY_CARE_PROVIDER_SITE_OTHER): Payer: No Payment, Other | Admitting: Licensed Clinical Social Worker

## 2020-03-15 DIAGNOSIS — F418 Other specified anxiety disorders: Secondary | ICD-10-CM
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
## 2020-03-18 ENCOUNTER — Encounter (HOSPITAL_COMMUNITY): Payer: Self-pay | Admitting: Emergency Medicine

## 2020-03-18 ENCOUNTER — Emergency Department (HOSPITAL_COMMUNITY): Payer: No Typology Code available for payment source

## 2020-03-18 ENCOUNTER — Other Ambulatory Visit: Payer: Self-pay

## 2020-03-18 ENCOUNTER — Emergency Department (HOSPITAL_COMMUNITY)
Admission: EM | Admit: 2020-03-18 | Discharge: 2020-03-18 | Disposition: A | Discharge status: Home / Self Care | Payer: No Typology Code available for payment source | Attending: Emergency Medicine | Admitting: Emergency Medicine

## 2020-03-18 DIAGNOSIS — Z20822 Contact with and (suspected) exposure to covid-19: Secondary | ICD-10-CM | POA: Insufficient documentation

## 2020-03-18 DIAGNOSIS — J45909 Unspecified asthma, uncomplicated: Secondary | ICD-10-CM | POA: Insufficient documentation

## 2020-03-18 DIAGNOSIS — Z79899 Other long term (current) drug therapy: Secondary | ICD-10-CM | POA: Insufficient documentation

## 2020-03-18 DIAGNOSIS — I10 Essential (primary) hypertension: Secondary | ICD-10-CM | POA: Insufficient documentation

## 2020-03-18 DIAGNOSIS — F17210 Nicotine dependence, cigarettes, uncomplicated: Secondary | ICD-10-CM | POA: Insufficient documentation

## 2020-03-18 DIAGNOSIS — Z91040 Latex allergy status: Secondary | ICD-10-CM | POA: Insufficient documentation

## 2020-03-18 DIAGNOSIS — E039 Hypothyroidism, unspecified: Secondary | ICD-10-CM | POA: Insufficient documentation

## 2020-03-18 DIAGNOSIS — J029 Acute pharyngitis, unspecified: Secondary | ICD-10-CM | POA: Insufficient documentation

## 2020-03-18 LAB — BASIC METABOLIC PANEL
Anion gap: 8 (ref 5–15)
BUN: 12 mg/dL (ref 6–20)
CO2: 26 mmol/L (ref 22–32)
Calcium: 9.9 mg/dL (ref 8.9–10.3)
Chloride: 105 mmol/L (ref 98–111)
Creatinine, Ser: 0.84 mg/dL (ref 0.44–1.00)
GFR, Estimated: >60 mL/min (ref >60)
Glucose, Bld: 102 mg/dL — ABNORMAL HIGH (ref 70–99)
Potassium: 4 mmol/L (ref 3.5–5.1)
Sodium: 139 mmol/L (ref 135–145)

## 2020-03-18 LAB — CBC WITH DIFFERENTIAL/PLATELET
Abs Immature Granulocytes: 0.04 10*3/uL (ref 0.00–0.07)
Basophils Absolute: 0 10*3/uL (ref 0.0–0.1)
Basophils Relative: 0 %
Eosinophils Absolute: 0.1 10*3/uL (ref 0.0–0.5)
Eosinophils Relative: 2 %
HCT: 42.9 % (ref 36.0–46.0)
Hemoglobin: 14.3 g/dL (ref 12.0–15.0)
Immature Granulocytes: 1 %
Lymphocytes Relative: 17 %
Lymphs Abs: 1.4 10*3/uL (ref 0.7–4.0)
MCH: 32.5 pg (ref 26.0–34.0)
MCHC: 33.3 g/dL (ref 30.0–36.0)
MCV: 97.5 fL (ref 80.0–100.0)
Monocytes Absolute: 0.5 10*3/uL (ref 0.1–1.0)
Monocytes Relative: 6 %
Neutro Abs: 6.2 10*3/uL (ref 1.7–7.7)
Neutrophils Relative %: 74 %
Platelets: 168 10*3/uL (ref 150–400)
RBC: 4.4 MIL/uL (ref 3.87–5.11)
RDW: 12.3 % (ref 11.5–15.5)
WBC: 8.3 10*3/uL (ref 4.0–10.5)
nRBC: 0 % (ref 0.0–0.2)

## 2020-03-18 LAB — RESPIRATORY PANEL BY RT PCR (FLU A&B, COVID)
Influenza A by PCR: NEGATIVE
Influenza B by PCR: NEGATIVE
SARS Coronavirus 2 by RT PCR: NEGATIVE

## 2020-03-18 LAB — GROUP A STREP BY PCR: Group A Strep by PCR: NOT DETECTED

## 2020-03-18 MED ORDER — LIDOCAINE VISCOUS HCL 2 % MT SOLN: 15.0000 mL | OROMUCOSAL | 0 refills | Status: DC | PRN

## 2020-03-18 MED ORDER — ACETAMINOPHEN 325 MG PO TABS
650.0000 mg | ORAL_TABLET | Freq: Once | ORAL | Status: AC
  Administered 2020-03-18: 650 mg via ORAL
  Filled 2020-03-18: qty 2

## 2020-03-18 NOTE — Discharge Instructions (Addendum)
You were evaluated today for sore throat. You tested negative for strep throat, Covid, influenza a/B. Your chest x-ray and blood work were reassuring.   I feel you are safe to be discharged this time. It is likely that you have a viral infection causing your sore throat.  I prescribed you viscous lidocaine, which can help numb your throat to make eating and drinking more comfortable. Please follow the directions on the bottle. You may utilize Tylenol at home for pain.   Follow-up with your primary care doctor in 1 week. Please return to the emergency department if you develop any worsening

## 2020-03-18 NOTE — ED Triage Notes (Signed)
Pt c/o of sore throat and cough since Monday 10/11 that has gotten progressively worse. Pt stated she got her flu shot Friday 10/08.  Denies fevers.

## 2020-03-18 NOTE — ED Provider Notes (Signed)
[facility] DEPT Provider Note   CSN: [id number] Arrival date & time: 03/18/20  0857     History Chief Complaint  Patient presents with  . Sore Throat  . Cough    Kristen Moore is a 49 y.o. female who presents with 5 days of sore throat, productive cough, congestion, headache.  She states that her throat is too sore to eat or drink normally, pain is worse when she swallows but she is able to swallow with ease.  Denies nausea, vomiting, diarrhea.  Endorses one episode of loose stool this morning. Reports that she is menopausal, so she does experience hot flashes but denies any new fevers, chills at home.  She also endorses some intermittent right ear pain, "popping".  Patient was diagnosed with Covid infection in October/2020.  She subsequently received 2 doses of COVID-19 vaccine in the spring 2021.  She denies known sick contacts.  I have personally read this patient medical records.  She is treated for hyperlipidemia, bipolar disorder, Zaidi disorder, hypothyroidism.  She states that she does not have insurance at this time, so she does not follow with primary care as regularly as she would like.  States that she has been feeling well up until Monday.  She did receive her flu vaccine on Friday 03/11/20.  Patient is a current everyday smoker, 1 pack/day.  Denies alcohol use, denies illicit drug use. Patient says she is unable to take ibuprofen or other medications class due to impact of lithium on her kidneys.  She states she has had strep throat infections in the past. She also states she cannot take NSAIDs, because she has had problems with kidneys in the past.   HPI     Past Medical History:  Diagnosis Date  . Abnormal Pap smear    cryo  . Anemia   . Asthma   . Bipolar 1 disorder ([place])   . Cyst of breast    left breast  . DUB (dysfunctional uterine bleeding)   . Fibroids   . Headache(784.0)   . Hypothyroidism   . IBS (irritable bowel  syndrome)   . Thyroid dysfunction     Patient Active Problem List   Diagnosis Date Noted  . Generalized anxiety disorder 02/02/2020  . Screening breast examination 05/19/2019  . Breast pain 05/19/2019  . Bipolar depression ([place]) 05/07/2019  . Elevated blood pressure reading in office without diagnosis of hypertension 08/20/2018  . Hyperthyroidism 08/20/2018  . 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 Rotator cuff impingement syndrome 09/03/2016  . 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 IBS (irritable bowel syndrome) 10/21/2015  . Dyshidrotic hand dermatitis 10/21/2015  . Dyshidrotic foot dermatitis 10/21/2015  . Bright red blood per rectum 10/21/2015  . S/P bilateral oophorectomy 10/21/2015  . Hypothyroidism 10/21/2015  . HLD (hyperlipidemia) 03/18/2015  . Knee pain, right 12/13/2014  . Pelvic pain 12/25/2012  . CHEST PAIN UNSPECIFIED 11/05/2008  . ELECTROCARDIOGRAM, ABNORMAL 11/05/2008    Past Surgical History:  Procedure Laterality Date  . ABDOMINAL HYSTERECTOMY Bilateral 12/25/2012   Procedure: HYSTERECTOMY ABDOMINAL;  Surgeon: Emily Filbert, MD;  Location: [facility];  Service: Gynecology;  Laterality: Bilateral;  TAH and bilateral salpingectomy  . BILATERAL SALPINGECTOMY Bilateral 12/25/2012   Procedure: BILATERAL SALPINGECTOMY;  Surgeon: Emily Filbert, MD;  Location: [facility];  Service: Gynecology;  Laterality: Bilateral;  . CERVICAL BIOPSY  W/ LOOP ELECTRODE EXCISION    . CHOLECYSTECTOMY    .  COLON SURGERY    . DILATION AND CURETTAGE OF UTERUS    . OOPHORECTOMY Bilateral 12/25/2012   Procedure: OOPHORECTOMY;  Surgeon: Emily Filbert, MD;  Location: [facility];  Service: Gynecology;  Laterality: Bilateral;  . TUBAL LIGATION       OB History    Gravida  3   Para  2   Term  1   Preterm  1   AB  1   Living  1     SAB      TAB  1   Ectopic      Multiple      Live Births              Family History  Problem  Relation Age of Onset  . Heart disease Father   . Cancer Father 37       lung  . Hypertension Mother   . Rheum arthritis Mother   . Fibromyalgia Mother   . COPD Mother   . Sleep apnea Mother   . Thyroid disease Mother   . Breast cancer Maternal Grandmother   . Breast cancer Paternal Aunt     Social History   Tobacco Use  . Smoking status: Current Every Day Smoker    Packs/day: 0.50    Years: 9.00    Pack years: 4.50    Types: Cigarettes  . Smokeless tobacco: Never Used  Vaping Use  . Vaping Use: Never used  Substance Use Topics  . Alcohol use: No    Alcohol/week: 0.0 standard drinks  . Drug use: No    Home Medications Prior to Admission medications   Medication Sig Start Date End Date Taking? Authorizing Provider  acetaminophen (TYLENOL) 325 MG tablet Take 650-975 mg by mouth every 6 (six) hours as needed for mild pain, fever or headache.   Yes [provider]  atorvastatin (LIPITOR) 40 MG tablet Take 1 tablet (40 mg total) by mouth daily. 02/02/20  Yes Kerin Perna, NP  BLACK COHOSH EXTRACT PO Take 2 tablets by mouth daily.   Yes [provider]  buPROPion (WELLBUTRIN XL) 300 MG 24 hr tablet Take 1 tablet (300 mg total) by mouth daily. 02/02/20  Yes Eulis Canner E, NP  hydrOXYzine (ATARAX/VISTARIL) 10 MG tablet Take 1 tablet (10 mg total) by mouth 3 (three) times daily as needed. Patient taking differently: Take 10 mg by mouth 3 (three) times daily as needed for anxiety.  02/02/20  Yes Eulis Canner E, NP  levothyroxine (SYNTHROID) 150 MCG tablet Take 1 tablet (150 mcg total) by mouth daily. 02/02/20  Yes Kerin Perna, NP  lithium carbonate 300 MG capsule Take 1-2 capsules (300-600 mg total) by mouth 2 (two) times daily with a meal. Takes 300 mg in the morning at 0800, 300 mg at 1600, and 600 mg at bedtime Patient taking differently: Take 300-600 mg by mouth See admin instructions. Takes 300 mg in the morning at 0800, 600 mg at bedtime  02/02/20  Yes Eulis Canner E, NP  Multiple Vitamin (MULTIVITAMIN) capsule Take 2 capsules by mouth daily.    Yes [provider]  Multiple Vitamins-Minerals (ZINC PO) Take 2 tablets by mouth daily.   Yes [provider]  OVER THE COUNTER MEDICATION Take 2 tablets by mouth daily. Sea moss vitamin   Yes [provider]  phenol-menthol (CEPASTAT) 14.5 MG lozenge Place 1 lozenge inside cheek as needed for sore throat.   Yes [provider]  lidocaine (XYLOCAINE) 2 %  solution Use as directed 15 mLs in the mouth or throat as needed for mouth pain (Gargle and spit 15 mL every 3 hours as needed for pain). 03/18/20   Karesa Maultsby, Eugene Garnet R, PA-C  potassium chloride (K-DUR) 10 MEQ tablet Take 1 tablet (10 mEq total) by mouth 2 (two) times daily. Patient not taking: Reported on 05/29/2018 11/14/17   Clent Demark, PA-C    Allergies    Latex, Penicillins, Bee venom, Omega-3, and Other  Review of Systems   Review of Systems  Constitutional: Negative for activity change, appetite change, chills, fatigue and fever.  HENT: Positive for congestion, ear pain, rhinorrhea, sneezing and sore throat. Negative for trouble swallowing and voice change.   Eyes: Negative.   Respiratory: Positive for cough and chest tightness.        Dyspnea on exertion.  Cardiovascular: Negative for chest pain, palpitations and leg swelling.  Gastrointestinal: Negative for abdominal pain, diarrhea, nausea and vomiting.  Genitourinary: Negative for dysuria and hematuria.  Musculoskeletal: Negative.   Skin: Negative.   Neurological: Positive for headaches. Negative for dizziness, syncope, weakness and light-headedness.    Physical Exam Updated Vital Signs BP 124/84   Pulse (!) 50   Temp 98.5 F (36.9 C) (Oral)   Resp 17   Ht 5\' 4"  (1.626 m)   Wt 95.3 kg   LMP 11/29/2012   SpO2 98%   BMI 36.05 kg/m   Physical Exam Vitals and nursing note reviewed.  HENT:     Head:  Normocephalic and atraumatic.     Jaw: No trismus.     Right Ear: Hearing, tympanic membrane, ear canal and external ear normal. No tenderness.     Left Ear: Hearing, tympanic membrane, ear canal and external ear normal. No tenderness.     Nose: Congestion and rhinorrhea present. Rhinorrhea is purulent.     Right Nostril: No occlusion.     Left Nostril: No occlusion.     Right Turbinates: Enlarged.     Left Turbinates: Enlarged.     Mouth/Throat:     Mouth: Mucous membranes are moist. No oral lesions or angioedema.     Tongue: No lesions.     Pharynx: Uvula midline. Pharyngeal swelling, oropharyngeal exudate and posterior oropharyngeal erythema present.     Tonsils: Tonsillar exudate present. No tonsillar abscesses. 2+ on the right. 2+ on the left.     Comments: Bilateral tonsillar edema, white to yellow exudate on tonsils bilaterally.  Erythema of the posterior pharynx.  No uvula deviation, or sign of peritonsillar or retropharyngeal abscess.  No sublingual or submental tenderness to palpation.  Normal phonation. Eyes:     General:        Right eye: No discharge.        Left eye: No discharge.     Conjunctiva/sclera: Conjunctivae normal.     Pupils: Pupils are equal, round, and reactive to light.  Cardiovascular:     Rate and Rhythm: Normal rate and regular rhythm.     Pulses: Normal pulses.     Heart sounds: Normal heart sounds. No murmur heard.  No gallop.   Pulmonary:     Effort: Pulmonary effort is normal. No respiratory distress.     Breath sounds: Wheezing and rhonchi present. No rales.  Chest:     Chest wall: No tenderness.  Abdominal:     General: There is no distension.     Palpations: Abdomen is soft.     Tenderness: There is no abdominal tenderness.  Musculoskeletal:        General: No deformity.     Cervical back: Normal range of motion and neck supple.  Lymphadenopathy:     Cervical: Cervical adenopathy present.     Right cervical: Superficial cervical  adenopathy present.     Left cervical: Superficial cervical adenopathy present.  Skin:    General: Skin is warm and dry.     Capillary Refill: Capillary refill takes less than 2 seconds.  Neurological:     General: No focal deficit present.     Mental Status: She is alert and oriented to person, place, and time. Mental status is at baseline.  Psychiatric:        Mood and Affect: Mood normal.     ED Results / Procedures / Treatments   Labs (all labs ordered are listed, but only abnormal results are displayed) Labs Reviewed  BASIC METABOLIC PANEL - Abnormal; Notable for the following components:      Result Value   Glucose, Bld 102 (*)    All other components within normal limits  RESPIRATORY PANEL BY RT PCR (FLU A&B, COVID)  GROUP A STREP BY PCR  CBC WITH DIFFERENTIAL/PLATELET    EKG None  Radiology DG Chest Port 1 View  Result Date: 03/18/2020 CLINICAL DATA:  Cough EXAM: PORTABLE CHEST 1 VIEW COMPARISON:  08/09/2017 FINDINGS: The heart size and mediastinal contours are within normal limits. No focal airspace consolidation, pleural effusion, or pneumothorax. The visualized skeletal structures are unremarkable. IMPRESSION: No active disease. Electronically Signed   By: Davina Poke D.O.   On: 03/18/2020 09:51    Procedures Procedures (including critical care time)  Medications Ordered in ED Medications  acetaminophen (TYLENOL) tablet 650 mg (650 mg Oral Given 03/18/20 0944)    ED Course  I have reviewed the triage vital signs and the nursing notes.  Pertinent labs & imaging results that were available during my care of the patient were reviewed by me and considered in my medical decision making (see chart for details).    MDM Rules/Calculators/A&P                         Differential diagnosis for this patient's sore throat includes in the GIS pharyngitis, viral URI, COVID-19, peritonsillar abscess, retropharyngeal abscess, Ludwig's angina, .    Hypertensive  150/104 on intake, vital signs otherwise stable.  O2 sat 97% on room air.  Patient is afebrile.  GAS strep, respiratory pathogen panel ordered  CXR  CBC, BMP ordered   Will administer tylenol for pain.    CBC unremarkable. BMP unremarkable  CXR: no acute cardiopulmonary process.   Negative for GAS pharyngitis by PCR.   Respiratory pathogen panel negative for COVID-19, Influenza A/B.   It is likely that the patient's symptoms are secondary to an acute upper respiratory viral infection.   Patient vital signs have remained stable throughout her stay in the emergency department. I do not feel any further work-up is necessary emergency department at this time.  Will discharge home with viscous lidocaine, she may use Tylenol for pain management. Encouraged hydration, rest.  Strict return precautions were given. She voiced understanding of my evaluation and treatment plan. Each of her questions were answered to her expressed satisfaction. Patient is stable for discharge at this time.    Kristen Moore was evaluated in Emergency Department on 03/18/2020 for the symptoms described in the history of present illness. She was evaluated in the context of  the global COVID-19 pandemic, which necessitated consideration that the patient might be at risk for infection with the SARS-CoV-2 virus that causes COVID-19. Institutional protocols and algorithms that pertain to the evaluation of patients at risk for COVID-19 are in a state of rapid change based on information released by regulatory bodies including the CDC and federal and state organizations. These policies and algorithms were followed during the patient's care in the ED.  Final Clinical Impression(s) / ED Diagnoses Final diagnoses:  Sore throat    Rx / DC Orders ED Discharge Orders         Ordered    lidocaine (XYLOCAINE) 2 % solution  As needed        03/18/20 [address], Gypsy Balsam, PA-C 03/18/20 1323      Lacretia Leigh, MD 03/24/20 1139

## 2020-03-18 NOTE — ED Notes (Signed)
Discharge paperwork reviewed with pt, pt with no questions or concerns at time of discharge.  Pt ambulatory to ED exit.

## 2020-03-29 ENCOUNTER — Other Ambulatory Visit: Payer: Self-pay

## 2020-03-29 ENCOUNTER — Ambulatory Visit (INDEPENDENT_AMBULATORY_CARE_PROVIDER_SITE_OTHER): Payer: No Payment, Other | Admitting: Licensed Clinical Social Worker

## 2020-03-29 DIAGNOSIS — F418 Other specified anxiety disorders: Secondary | ICD-10-CM

## 2020-03-29 NOTE — Progress Notes (Signed)
   THERAPIST PROGRESS NOTE  Virtual Visit via Video Note  I connected with Kristen Moore on 03/29/20 at  1:00 PM EDT by a video enabled telemedicine application and verified that I am speaking with the correct person using two identifiers.  Location: Patient: Home Provider: [facility]   I discussed the limitations of evaluation and management by telemedicine and the availability of in person appointments. The patient expressed understanding and agreed to proceed. I discussed the assessment and treatment plan with the patient. The patient was provided an opportunity to ask questions and all were answered. The patient agreed with the plan and demonstrated an understanding of the instructions.  I provided 40 minutes of non-face-to-face time during this encounter.  Participation Level: Active  Behavioral Response: CasualAlertAnxious  Type of Therapy: Individual Therapy  Treatment Goals addressed: Anxiety and Coping  Interventions: Motivational Interviewing and Supportive  Summary: Kristen Moore is a 49 y.o. female who presents with hx of anx/dep. This date pt signs on for video session per her preference. Entire session devoted to pt's anx/coping with the situation re her dtr and the baby her dtr just had. Pt strongly believes she is going to get temporary custody of infant, court 04/01/20. She has many mixed emotions about gaining custody and preparing for same as she will continue part time work. LCSW validates feelings. Pt also vents about complicating fam dynamics with bro and his fam who comes to the home daily. Discussed setting precedence with this new baby coming into the home. Pt verbalizes understanding. She states her dtr is continuing with very active substance abuse. Pt's dtr refusing all help. Pt knows she will have to call police if her dtr comes onto the property. She states she will follow through with this as the baby being with family is the most important at this  time. LCSW assessed for adequate supplies for baby, pt explains and has adequate supplies at present and knows resources. LCSW provided active listening, support and again addressed coping strategies. Reviewed poc with pt's verbal acceptance prior to close of session. Pt states appreciation for care and assist with coping saying it is helpful to have sessions/support.    Suicidal/Homicidal: Nowithout intent/plan  Therapist Response: Pt continues to be receptive to care.  Plan: Return again in 2 weeks.  Diagnosis: Axis I: Anxiety with depression    Axis II: Deferred  [name], LCSW 03/29/2020

## 2020-04-12 ENCOUNTER — Other Ambulatory Visit: Payer: Self-pay

## 2020-04-12 ENCOUNTER — Ambulatory Visit (INDEPENDENT_AMBULATORY_CARE_PROVIDER_SITE_OTHER): Payer: No Payment, Other | Admitting: Licensed Clinical Social Worker

## 2020-04-12 DIAGNOSIS — F418 Other specified anxiety disorders: Secondary | ICD-10-CM | POA: Diagnosis not present

## 2020-04-19 NOTE — Progress Notes (Signed)
   THERAPIST PROGRESS NOTE   Virtual Visit via Video Note  I connected with Kristen Moore on 04/12/20 at  1:00 PM EST by a video enabled telemedicine application and verified that I am speaking with the correct person using two identifiers.  Location: Patient: Home Provider: [facility]   I discussed the limitations of evaluation and management by telemedicine and the availability of in person appointments. The patient expressed understanding and agreed to proceed. I discussed the assessment and treatment plan with the patient. The patient was provided an opportunity to ask questions and all were answered. The patient agreed with the plan and demonstrated an understanding of the instructions.   I provided 40 minutes of non-face-to-face time during this encounter.  Participation Level: Active  Behavioral Response: CasualAlertAnxious  Type of Therapy: Individual Therapy  Treatment Goals addressed: Anxiety and Coping  Interventions: Motivational Interviewing and Supportive  Summary: Kristen Moore is a 49 y.o. female who presents with hx of anx/dep. This date pt signs on for video session per her preference. Pt advises the court date regarding temporary custody of pt's new grandchild was postponed. Pt is anxious and frustrated about what will happen and about not being able to see grandchild over the holidays. She reports their visitation with the foster family and foster SW is limited. Pt states her dtr was incarcerated again. Dtr was calling pt and pt's mother to get her out and they refused. Pt states "some guy" got her out and she is back on the street. She also states she learned that dtr has Hep C so she is worried about this new development. Pt continues to struggle with family dynamics in immediate household. She provides many details about her mother/father's medical appts/needs and feels obligated to cook, do other tasks for family who are old enough to be  helping/participating. Assisted pt to process this choice. Pt states she plans to continue as is to "keep the peace". Pt talks about managing work stress and needing a break. LCSW provided education on communication strategies with boss/employer. Pt states she ran out of meds for some days recently when asked about med management. Pt states she is back on meds as prescribed and acknowledges the importance of the role meds play in her coping. Pt reports goal of wanting to get her license. Reviewed steps toward goal including strategy to save money toward this goal. LCSW reviewed poc and coping strategies prior to close of session. 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 Pt states appreciation for care.    Suicidal/Homicidal: Nowithout intent/plan  Therapist Response: Pt remains receptive to care.  Plan: Return again in ~ 4 weeks.  Diagnosis: Axis I: Anxiety with depression    Axis II: Deferred  [name], LCSW 04/19/2020

## 2020-04-22 ENCOUNTER — Other Ambulatory Visit: Payer: Self-pay | Admitting: Obstetrics and Gynecology

## 2020-04-22 ENCOUNTER — Telehealth (INDEPENDENT_AMBULATORY_CARE_PROVIDER_SITE_OTHER): Payer: Self-pay | Admitting: Primary Care

## 2020-04-22 DIAGNOSIS — Z0000 Encounter for general adult medical examination without abnormal findings: Secondary | ICD-10-CM

## 2020-04-25 ENCOUNTER — Ambulatory Visit (INDEPENDENT_AMBULATORY_CARE_PROVIDER_SITE_OTHER): Payer: Self-pay | Admitting: Primary Care

## 2020-04-25 ENCOUNTER — Encounter (INDEPENDENT_AMBULATORY_CARE_PROVIDER_SITE_OTHER): Payer: Self-pay | Admitting: Primary Care

## 2020-04-25 ENCOUNTER — Other Ambulatory Visit: Payer: Self-pay

## 2020-04-25 VITALS — BP 121/82 | HR 68 | Temp 97.5°F | Ht 64.0 in | Wt 209.0 lb

## 2020-04-25 DIAGNOSIS — Z131 Encounter for screening for diabetes mellitus: Secondary | ICD-10-CM

## 2020-04-25 DIAGNOSIS — N644 Mastodynia: Secondary | ICD-10-CM

## 2020-04-25 LAB — POCT GLYCOSYLATED HEMOGLOBIN (HGB A1C): Hemoglobin A1C: 5.1 % (ref 4.0–5.6)

## 2020-04-25 NOTE — Progress Notes (Signed)
Acute Office Visit  Subjective:    Patient ID: Kristen Moore, female    DOB: 07-15-1970, 49 y.o.   MRN: [id number]  Chief Complaint  Patient presents with  . Breast Pain    right     HPI Kristen Moore is a 49 year old female who patient today for right breast pain.  She noticed it on examination SBE and was concerned.  No only tender and sore with palpitation.  Past Medical History:  Diagnosis Date  . Abnormal Pap smear    cryo  . Anemia   . Asthma   . Bipolar 1 disorder ([place])   . Cyst of breast    left breast  . DUB (dysfunctional uterine bleeding)   . Fibroids   . Headache(784.0)   . Hypothyroidism   . IBS (irritable bowel syndrome)   . Thyroid dysfunction     Past Surgical History:  Procedure Laterality Date  . ABDOMINAL HYSTERECTOMY Bilateral 12/25/2012   Procedure: HYSTERECTOMY ABDOMINAL;  Surgeon: Emily Filbert, MD;  Location: [facility];  Service: Gynecology;  Laterality: Bilateral;  TAH and bilateral salpingectomy  . BILATERAL SALPINGECTOMY Bilateral 12/25/2012   Procedure: BILATERAL SALPINGECTOMY;  Surgeon: Emily Filbert, MD;  Location: [facility];  Service: Gynecology;  Laterality: Bilateral;  . CERVICAL BIOPSY  W/ LOOP ELECTRODE EXCISION    . CHOLECYSTECTOMY    . COLON SURGERY    . DILATION AND CURETTAGE OF UTERUS    . OOPHORECTOMY Bilateral 12/25/2012   Procedure: OOPHORECTOMY;  Surgeon: Emily Filbert, MD;  Location: [facility];  Service: Gynecology;  Laterality: Bilateral;  . TUBAL LIGATION      Family History  Problem Relation Age of Onset  . Heart disease Father   . Cancer Father 15       lung  . Hypertension Mother   . Rheum arthritis Mother   . Fibromyalgia Mother   . COPD Mother   . Sleep apnea Mother   . Thyroid disease Mother   . Breast cancer Maternal Grandmother   . Breast cancer Paternal Aunt     Social History   Socioeconomic History  . Marital status: Single    Spouse name: Not on file  . Number of children: Not on file  .  Years of education: Not on file  . Highest education level: Associate degree: occupational, Hotel manager, or vocational program  Occupational History  . Not on file  Tobacco Use  . Smoking status: Current Every Day Smoker    Packs/day: 0.50    Years: 9.00    Pack years: 4.50    Types: Cigarettes  . Smokeless tobacco: Never Used  Vaping Use  . Vaping Use: Never used  Substance and Sexual Activity  . Alcohol use: No    Alcohol/week: 0.0 standard drinks  . Drug use: No  . Sexual activity: Not Currently    Birth control/protection: Surgical  Other Topics Concern  . Not on file  Social History Narrative  . Not on file   Social Determinants of Health   Financial Resource Strain:   . Difficulty of Paying Living Expenses: Not on file  Food Insecurity:   . Worried About Charity fundraiser in the Last Year: Not on file  . Ran Out of Food in the Last Year: Not on file  Transportation Needs: Unmet Transportation Needs  . Lack of Transportation (Medical): Yes  . Lack of Transportation (Non-Medical): Not on file  Physical Activity:   . Days of  Exercise per Week: Not on file  . Minutes of Exercise per Session: Not on file  Stress:   . Feeling of Stress : Not on file  Social Connections:   . Frequency of Communication with Friends and Family: Not on file  . Frequency of Social Gatherings with Friends and Family: Not on file  . Attends Religious Services: Not on file  . Active Member of Clubs or Organizations: Not on file  . Attends Archivist Meetings: Not on file  . Marital Status: Not on file  Intimate Partner Violence:   . Fear of Current or Ex-Partner: Not on file  . Emotionally Abused: Not on file  . Physically Abused: Not on file  . Sexually Abused: Not on file    Outpatient Medications Prior to Visit  Medication Sig Dispense Refill  . atorvastatin (LIPITOR) 40 MG tablet Take 1 tablet (40 mg total) by mouth daily. 90 tablet 1  . BLACK COHOSH EXTRACT PO Take 2  tablets by mouth daily.    Marland Kitchen buPROPion (WELLBUTRIN XL) 300 MG 24 hr tablet Take 1 tablet (300 mg total) by mouth daily. 30 tablet 2  . hydrOXYzine (ATARAX/VISTARIL) 10 MG tablet Take 1 tablet (10 mg total) by mouth 3 (three) times daily as needed. (Patient taking differently: Take 10 mg by mouth 3 (three) times daily as needed for anxiety. ) 90 tablet 2  . levothyroxine (SYNTHROID) 150 MCG tablet Take 1 tablet (150 mcg total) by mouth daily. 90 tablet 1  . lithium carbonate 300 MG capsule Take 1-2 capsules (300-600 mg total) by mouth 2 (two) times daily with a meal. Takes 300 mg in the morning at 0800, 300 mg at 1600, and 600 mg at bedtime (Patient taking differently: Take 300-600 mg by mouth See admin instructions. Takes 300 mg in the morning at 0800, 600 mg at bedtime) 90 capsule 2  . Multiple Vitamin (MULTIVITAMIN) capsule Take 2 capsules by mouth daily.     . Multiple Vitamins-Minerals (ZINC PO) Take 2 tablets by mouth daily.    Marland Kitchen OVER THE COUNTER MEDICATION Take 2 tablets by mouth daily. Sea moss vitamin    . acetaminophen (TYLENOL) 325 MG tablet Take 650-975 mg by mouth every 6 (six) hours as needed for mild pain, fever or headache.    . lidocaine (XYLOCAINE) 2 % solution Use as directed 15 mLs in the mouth or throat as needed for mouth pain (Gargle and spit 15 mL every 3 hours as needed for pain). 100 mL 0  . phenol-menthol (CEPASTAT) 14.5 MG lozenge Place 1 lozenge inside cheek as needed for sore throat.    . potassium chloride (K-DUR) 10 MEQ tablet Take 1 tablet (10 mEq total) by mouth 2 (two) times daily. (Patient not taking: Reported on 05/29/2018) 60 tablet 3   No facility-administered medications prior to visit.    Allergies  Allergen Reactions  . Latex Swelling  . Penicillins Swelling      . Bee Venom Swelling  . Omega-3 Hives and Swelling    Potatoes    . Other Hives and Swelling    Potatoes       Review of Systems  Cardiovascular:       Right breast sore with  palpitation   All other systems reviewed and are negative.      Objective:    Physical Exam Vitals reviewed.  Constitutional:      Appearance: Normal appearance.  HENT:     Head: Normocephalic.  Nose: Nose normal.  Cardiovascular:     Rate and Rhythm: Normal rate.  Chest:       Comments: ;(trpt4\)r=  Abdominal:     General: Abdomen is flat. Bowel sounds are normal.     Palpations: Abdomen is soft.  Musculoskeletal:        General: Normal range of motion.     Cervical back: Normal range of motion.  Skin:    General: Skin is warm and dry.  Neurological:     Mental Status: She is alert and oriented to person, place, and time.  Psychiatric:        Mood and Affect: Mood normal.        Behavior: Behavior normal.        Thought Content: Thought content normal.        Judgment: Judgment normal.     BP 121/82 (BP Location: Right Arm, Patient Position: Sitting, Cuff Size: Large)   Pulse 68   Temp (!) 97.5 F (36.4 C) (Temporal)   Ht 5\' 4"  (1.626 m)   Wt 209 lb (94.8 kg)   LMP 11/29/2012   SpO2 95%   BMI 35.87 kg/m  Wt Readings from Last 3 Encounters:  04/25/20 209 lb (94.8 kg)  03/18/20 210 lb (95.3 kg)  03/11/20 210 lb (95.3 kg)    There are no preventive care reminders to display for this patient.  There are no preventive care reminders to display for this patient.   Lab Results  Component Value Date   TSH 0.485 06/30/2019   Lab Results  Component Value Date   WBC 8.3 03/18/2020   HGB 14.3 03/18/2020   HCT 42.9 03/18/2020   MCV 97.5 03/18/2020   PLT 168 03/18/2020   Lab Results  Component Value Date   NA 139 03/18/2020   K 4.0 03/18/2020   CO2 26 03/18/2020   GLUCOSE 102 (H) 03/18/2020   BUN 12 03/18/2020   CREATININE 0.84 03/18/2020   BILITOT 0.2 06/30/2019   ALKPHOS 75 06/30/2019   AST 20 06/30/2019   ALT 20 06/30/2019   PROT 6.7 06/30/2019   ALBUMIN 4.4 06/30/2019   CALCIUM 9.9 03/18/2020   ANIONGAP 8 03/18/2020   Lab Results    Component Value Date   CHOL 242 (H) 06/30/2019   Lab Results  Component Value Date   HDL 46 06/30/2019   Lab Results  Component Value Date   LDLCALC 145 (H) 06/30/2019   Lab Results  Component Value Date   TRIG 279 (H) 06/30/2019   Lab Results  Component Value Date   CHOLHDL 5.3 (H) 06/30/2019   Lab Results  Component Value Date   HGBA1C 5.1 04/25/2020       Assessment & Plan:  Shandricka was seen today for breast pain.  Diagnoses and all orders for this visit:  Screening for diabetes mellitus -     HgB A1c 5.1 per ADA guidelines no pre/diabetes   Breast pain See PE  -     MM Digital Diagnostic Unilat R; Future     Kerin Perna, NP

## 2020-04-25 NOTE — Patient Instructions (Signed)
Breast Cyst  A breast cyst is a sac in the breast that is filled with fluid. They are usually noncancerous (benign) and are common among women. Breast cysts are most often in the upper, outer portion of the breast. One or more cysts may develop. They form when fluid builds up inside the breast glands. There are several types of breast cysts. Some are too small to feel, but these can be seen with imaging tests such as an X-ray of the breast (mammogram) or ultrasound. Breast cysts do not increase your risk of breast cancer. They usually disappear after you no longer have a menstrual cycle (after menopause), unless you take artificial hormones (are on hormone therapy). What are the causes? This condition may be caused by:  Blockage of tubes (ducts) in the breast glands, which leads to fluid buildup. Duct blockage may result from: ? Fibrocystic breast changes. This is a common, benign condition that occurs when women go through hormonal changes during the menstrual cycle. This is a common cause of multiple breast cysts. ? Overgrowth of breast tissue or breast glands. ? Scar tissue in the breast from previous surgery.  Changes in certain female hormones (estrogen and progesterone). The exact cause of this condition is not known. What increases the risk? You may be more likely to develop breast cysts if you have not gone through menopause. What are the signs or symptoms? Symptoms of this condition include:  Feeling one or more smooth, round, soft lumps (like grapes) in the breast that are easily movable. The lump or lumps may get bigger and more painful before your menstrual period and get smaller after your menstrual period.  Breast discomfort or pain. How is this diagnosed? This condition may be diagnosed based on:  A physical exam. A cyst can be felt by your health care provider during this exam.  Imaging tests, such as mammogram or ultrasound. Fluid may be removed from the cyst with a  needle (fine-needle aspiration) and tested to make sure the cyst is not cancerous. How is this treated? Treatment may not be needed for this condition. Your health care provider may monitor the cyst to see if it goes away on its own. If the cyst is uncomfortable or gets bigger, or if you do not like how the cyst makes your breast look, you may need treatment. Treatment may include:  Hormone therapy.  Fine-needle aspiration to drain fluid from the cyst. There is a chance of the cyst coming back (recurring) after aspiration.  Surgery to remove the cyst. Follow these instructions at home: Self-exams   Do a breast self-exam every month, or as often as directed. A breast self-exam involves: ? Comparing your breasts in the mirror. ? Looking for visible changes in your skin or nipples. ? Feeling for lumps or changes.  Having many breast cysts may make it harder to feel for new lumps. Understand how your breasts normally look and feel, and write down any changes in your breasts. Tell your health care provider about any changes. Eating and drinking  Follow instructions from your health care provider about eating and drinking restrictions.  Drink enough fluid to keep your urine pale yellow.  Avoid caffeine.  Cut down on salt (sodium) in what you eat and drink, especially before your menstrual period. Too much sodium can cause fluid buildup, breast swelling, and discomfort. General instructions  See your health care provider regularly. ? Get a yearly physical exam. ? If you are 67-30 years old, get a  clinical breast exam every 1-3 years. After the age of 81 years, get this exam every year. ? Get mammograms as often as directed.  Take over-the-counter and prescription medicines only as told by your health care provider.  Wear a supportive bra, especially when exercising.  Keep all follow-up visits as told by your health care provider. This is important. Contact a health care provider if:   You feel, or think you feel, a lump in your breast.  You notice that both breasts look or feel different than usual.  Your breast is still causing pain after your menstrual period is over.  You find new lumps or bumps that were not there before.  You feel lumps in your armpit. Get help right away if:  You have severe pain, tenderness, redness, or warmth in your breast.  You have fluid or blood leaking from your nipple.  Your breast lump becomes hard and painful.  You notice dimpling or wrinkling of the breast or nipple. Summary  A breast cyst is a sac in the breast that is filled with fluid.  Treatment may not be needed for this condition.  If the cyst is uncomfortable or gets bigger, or if you do not like how the cyst makes your breast look, you may need treatment. This information is not intended to replace advice given to you by your health care provider. Make sure you discuss any questions you have with your health care provider. Document Revised: 10/07/2018 Document Reviewed: 10/07/2018 Elsevier Patient Education  [place].

## 2020-04-26 ENCOUNTER — Other Ambulatory Visit: Payer: Self-pay

## 2020-04-26 ENCOUNTER — Ambulatory Visit (INDEPENDENT_AMBULATORY_CARE_PROVIDER_SITE_OTHER): Payer: No Payment, Other | Admitting: Licensed Clinical Social Worker

## 2020-04-26 DIAGNOSIS — N6310 Unspecified lump in the right breast, unspecified quadrant: Secondary | ICD-10-CM

## 2020-04-26 DIAGNOSIS — F418 Other specified anxiety disorders: Secondary | ICD-10-CM | POA: Diagnosis not present
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
## 2020-05-03 ENCOUNTER — Other Ambulatory Visit: Payer: Self-pay

## 2020-05-03 ENCOUNTER — Telehealth (INDEPENDENT_AMBULATORY_CARE_PROVIDER_SITE_OTHER): Payer: No Payment, Other | Admitting: Psychiatry

## 2020-05-03 ENCOUNTER — Encounter (HOSPITAL_COMMUNITY): Payer: Self-pay | Admitting: Psychiatry

## 2020-05-03 DIAGNOSIS — F319 Bipolar disorder, unspecified: Secondary | ICD-10-CM

## 2020-05-03 DIAGNOSIS — F411 Generalized anxiety disorder: Secondary | ICD-10-CM | POA: Diagnosis not present

## 2020-05-03 MED ORDER — HYDROXYZINE HCL 10 MG PO TABS: 5.0000 mg | ORAL_TABLET | Freq: Three times a day (TID) | ORAL | 2 refills | Status: DC | PRN

## 2020-05-03 MED ORDER — LITHIUM CARBONATE 300 MG PO CAPS: 300.0000 mg | ORAL_CAPSULE | Freq: Two times a day (BID) | ORAL | 2 refills | Status: DC

## 2020-05-03 MED ORDER — BUPROPION HCL ER (XL) 300 MG PO TB24: 300.0000 mg | ORAL_TABLET | Freq: Every day | ORAL | 2 refills | Status: DC

## 2020-05-03 NOTE — Progress Notes (Signed)
Brian Head MD/PA/NP OP Progress Note Virtual Visit via Telephone Note  I connected with Kristen Moore on 05/03/20 at  3:30 PM EST by telephone and verified that I am speaking with the correct person using two identifiers.  Location: Patient: home Provider: Clinic   I discussed the limitations, risks, security and privacy concerns of performing an evaluation and management service by telephone and the availability of in person appointments. I also discussed with the patient that there may be a patient responsible charge related to this service. The patient expressed understanding and agreed to proceed.   I provided 30 minutes of non-face-to-face time during this encounter.   05/03/2020 4:01 PM Kristen Moore  MRN:  [id number]  Chief Complaint: "I just got a lot going on"  HPI: 49 year old female seen today for follow up psychiatric evaluation. She has a psychiatric history of anxiety, depression, and bipolar disorder. She is currently being managed on Wellbutrin XL 300 mg daily, hydroxyzine 10 mg three times daily, and lithium 300 mg in the morning and 600 mg nightly. She noted that her medications are effective in managing her psychiatric conditions.   Today she is unable to login virtually so the exam was done over the phone.  She informed provider that she occasionally has depression and anxiety due to life stressors. She informed Probation officer that  both of her parents have cancer. She also noted that she worries about her 17 year daughter suffers from cocaine abuse.  She noted that her daughter recently gave birth however informed writer that the child was taken away due to her substance use.  She informed provider that this upset her because she wanted to care for her granddaughter and reported that now she is in the custody of DSS.  She also informed provider that she continues to work at [employer] however noted that she becomes agitated at work because she has to work on [employer] and prefers to work alone.  She reported that her boss will not allow her to work alone.  She informed Probation officer that the above stressors cause agitation and increased worry however she noted that she is able to cope with it well. She denies SI/HI/VAH.  Informed provider that hydroxyzine is effective in managing her anxiety however notes that it is too sedating.  She is agreeable to reducing hydroxyzine 10 mg 3 times daily to hydroxyzine 5 mg 3 times daily.  She will continue all other medications as prescribed.  She will also follow up with outpatient counselor for therapy. No other concerns noted at this time.   Visit Diagnosis:    ICD-10-CM   1. Bipolar depression (HCC)  F31.9 buPROPion (WELLBUTRIN XL) 300 MG 24 hr tablet    lithium carbonate 300 MG capsule    Lithium level    CANCELED: Lithium level  2. Generalized anxiety disorder  F41.1 hydrOXYzine (ATARAX/VISTARIL) 10 MG tablet    Past Psychiatric History:  anxiety, depression, and bipolar disorder.  Past Medical History:  Past Medical History:  Diagnosis Date  . Abnormal Pap smear    cryo  . Anemia   . Asthma   . Bipolar 1 disorder ([place])   . Cyst of breast    left breast  . DUB (dysfunctional uterine bleeding)   . Fibroids   . Headache(784.0)   . Hypothyroidism   . IBS (irritable bowel syndrome)   . Thyroid dysfunction     Past Surgical History:  Procedure Laterality Date  .  ABDOMINAL HYSTERECTOMY Bilateral 12/25/2012   Procedure: HYSTERECTOMY ABDOMINAL;  Surgeon: Emily Filbert, MD;  Location: [facility];  Service: Gynecology;  Laterality: Bilateral;  TAH and bilateral salpingectomy  . BILATERAL SALPINGECTOMY Bilateral 12/25/2012   Procedure: BILATERAL SALPINGECTOMY;  Surgeon: Emily Filbert, MD;  Location: [facility];  Service: Gynecology;  Laterality: Bilateral;  . CERVICAL BIOPSY  W/ LOOP ELECTRODE EXCISION    . CHOLECYSTECTOMY    . COLON SURGERY    . DILATION AND CURETTAGE OF UTERUS    . OOPHORECTOMY Bilateral 12/25/2012    Procedure: OOPHORECTOMY;  Surgeon: Emily Filbert, MD;  Location: [facility];  Service: Gynecology;  Laterality: Bilateral;  . TUBAL LIGATION      Family Psychiatric History: Daughter substance abuse  Family History:  Family History  Problem Relation Age of Onset  . Heart disease Father   . Cancer Father 55       lung  . Hypertension Mother   . Rheum arthritis Mother   . Fibromyalgia Mother   . COPD Mother   . Sleep apnea Mother   . Thyroid disease Mother   . Breast cancer Maternal Grandmother   . Breast cancer Paternal Aunt     Social History:  Social History   Socioeconomic History  . Marital status: Single    Spouse name: Not on file  . Number of children: Not on file  . Years of education: Not on file  . Highest education level: Associate degree: occupational, Hotel manager, or vocational program  Occupational History  . Not on file  Tobacco Use  . Smoking status: Current Every Day Smoker    Packs/day: 0.50    Years: 9.00    Pack years: 4.50    Types: Cigarettes  . Smokeless tobacco: Never Used  Vaping Use  . Vaping Use: Never used  Substance and Sexual Activity  . Alcohol use: No    Alcohol/week: 0.0 standard drinks  . Drug use: No  . Sexual activity: Not Currently    Birth control/protection: Surgical  Other Topics Concern  . Not on file  Social History Narrative  . Not on file   Social Determinants of Health   Financial Resource Strain:   . Difficulty of Paying Living Expenses: Not on file  Food Insecurity:   . Worried About Charity fundraiser in the Last Year: Not on file  . Ran Out of Food in the Last Year: Not on file  Transportation Needs: Unmet Transportation Needs  . Lack of Transportation (Medical): Yes  . Lack of Transportation (Non-Medical): Not on file  Physical Activity:   . Days of Exercise per Week: Not on file  . Minutes of Exercise per Session: Not on file  Stress:   . Feeling of Stress : Not on file  Social Connections:   .  Frequency of Communication with Friends and Family: Not on file  . Frequency of Social Gatherings with Friends and Family: Not on file  . Attends Religious Services: Not on file  . Active Member of Clubs or Organizations: Not on file  . Attends Archivist Meetings: Not on file  . Marital Status: Not on file    Allergies:   Metabolic Disorder Labs: Lab Results  Component Value Date   HGBA1C 5.1 04/25/2020   MPG 88.19 04/18/2018   No results found for: PROLACTIN Lab Results  Component Value Date   CHOL 242 (H) 06/30/2019   TRIG 279 (H) 06/30/2019   HDL 46 06/30/2019  CHOLHDL 5.3 (H) 06/30/2019   VLDL 56 (H) 04/18/2018   LDLCALC 145 (H) 06/30/2019   LDLCALC 124 (H) 04/18/2018   Lab Results  Component Value Date   TSH 0.485 06/30/2019   TSH 0.615 08/20/2018    Therapeutic Level Labs: Lab Results  Component Value Date   LITHIUM 0.3 (L) 04/30/2018   LITHIUM 0.58 (L) 07/17/2010   No results found for: VALPROATE No components found for:  CBMZ  Current Medications: Current Outpatient Medications  Medication Sig Dispense Refill  . atorvastatin (LIPITOR) 40 MG tablet Take 1 tablet (40 mg total) by mouth daily. 90 tablet 1  . BLACK COHOSH EXTRACT PO Take 2 tablets by mouth daily.    Marland Kitchen buPROPion (WELLBUTRIN XL) 300 MG 24 hr tablet Take 1 tablet (300 mg total) by mouth daily. 30 tablet 2  . hydrOXYzine (ATARAX/VISTARIL) 10 MG tablet Take 0.5 tablets (5 mg total) by mouth 3 (three) times daily as needed. 45 tablet 2  . levothyroxine (SYNTHROID) 150 MCG tablet Take 1 tablet (150 mcg total) by mouth daily. 90 tablet 1  . lithium carbonate 300 MG capsule Take 1-2 capsules (300-600 mg total) by mouth 2 (two) times daily with a meal. Takes 300 mg in the morning and 600 mg at bedtime 90 capsule 2  . Multiple Vitamin (MULTIVITAMIN) capsule Take 2 capsules by mouth daily.     . Multiple Vitamins-Minerals (ZINC PO) Take 2 tablets by mouth daily.    Marland Kitchen OVER THE COUNTER  MEDICATION Take 2 tablets by mouth daily. Sea moss vitamin     No current facility-administered medications for this visit.     Musculoskeletal: Strength & Muscle Tone: Unable to assess due to telephone visit Gait & Station: Unable to assess due to telephone visit Patient leans: N/A  Psychiatric Specialty Exam: Review of Systems  Last menstrual period 11/29/2012.There is no height or weight on file to calculate BMI.  General Appearance: Unable to assess due to telephone visit  Eye Contact:  Unable to assess due to telephone visit  Speech:  Clear and Coherent and Normal Rate  Volume:  Normal  Mood:  Euthymic and Irritable  Affect:  Appropriate and Congruent  Thought Process:  Coherent, Goal Directed and Linear  Orientation:  Full (Time, Place, and Person)  Thought Content: WDL and Logical   Suicidal Thoughts:  No  Homicidal Thoughts:  No  Memory:  Immediate;   Good Recent;   Good Remote;   Good  Judgement:  Good  Insight:  Good  Psychomotor Activity:  Normal  Concentration:  Concentration: Good and Attention Span: Good  Recall:  Good  Fund of Knowledge: Good  Language: Good  Akathisia:  No  Handed:  Right  AIMS (if indicated): Not done  Assets:  Communication Skills Desire for Improvement Financial Resources/Insurance Housing Social Support  ADL's:  Intact  Cognition: WNL  Sleep:  Good   Screenings: GAD-7     Office Visit from 04/25/2020 in [facility] Counselor from 12/17/2019 in [facility] Office Visit from 09/01/2019 in [facility] Office Visit from 05/07/2019 in [facility] Office Visit from 04/06/2019 in [facility]  Total GAD-7 Score 6 19 5 15  0    PHQ2-9     Office Visit from 04/25/2020 in [facility] Counselor from 12/17/2019 in [facility] Office Visit from 09/01/2019 in [facility]  Office Visit from 05/07/2019 in [facility] Office Visit from 04/06/2019 in [facility]  PHQ-2 Total Score 2 6 1 3  0  PHQ-9 Total Score 6 17 -- 16 --       Assessment and Plan: Patient informed provider that is doing well on her current medication regimen.  At times she notes that she is irritable, has anxiety, and depression due to life stressors (her parents having cancer, her granddaughter being in the custody of DSS, and her daughter being addicted to cocaine).  She notes that hydroxyzine is effective in managing her anxiety however noted that it is too sedating.  She is agreeable to reducing hydroxyzine 10 mg to 5 mg 3 times daily as needed for anxiety.  She will continue all other medications as prescribed.  1. Bipolar depression ([place])  Continue- buPROPion (WELLBUTRIN XL) 300 MG 24 hr tablet; Take 1 tablet (300 mg total) by mouth daily.  Dispense: 30 tablet; Refill: 2 Continue- lithium carbonate 300 MG capsule; Take 1-2 capsules (300-600 mg total) by mouth 2 (two) times daily with a meal. Takes 300 mg in the morning and 600 mg at bedtime  Dispense: 90 capsule; Refill: 2 - Lithium level; Future  2. Generalized anxiety disorder  Continue- hydrOXYzine (ATARAX/VISTARIL) 10 MG tablet; Take 0.5 tablets (5 mg total) by mouth 3 (three) times daily as needed.  Dispense: 45 tablet; Refill: 2    Salley Slaughter, NP 05/03/2020, 4:01 PM

## 2020-05-12 ENCOUNTER — Ambulatory Visit: Payer: No Typology Code available for payment source

## 2020-05-12 ENCOUNTER — Other Ambulatory Visit: Payer: No Typology Code available for payment source

## 2020-06-07 ENCOUNTER — Other Ambulatory Visit: Payer: Self-pay

## 2020-06-07 ENCOUNTER — Ambulatory Visit (INDEPENDENT_AMBULATORY_CARE_PROVIDER_SITE_OTHER): Payer: No Payment, Other | Admitting: Licensed Clinical Social Worker

## 2020-06-07 DIAGNOSIS — F418 Other specified anxiety disorders: Secondary | ICD-10-CM
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
## 2020-06-14 ENCOUNTER — Other Ambulatory Visit: Payer: Self-pay | Admitting: Obstetrics and Gynecology

## 2020-06-14 ENCOUNTER — Ambulatory Visit
Admission: RE | Admit: 2020-06-14 | Discharge: 2020-06-14 | Disposition: A | Discharge status: Home / Self Care | Payer: No Typology Code available for payment source | Source: Ambulatory Visit | Attending: Obstetrics and Gynecology | Admitting: Obstetrics and Gynecology

## 2020-06-14 ENCOUNTER — Ambulatory Visit: Payer: Self-pay | Admitting: *Deleted

## 2020-06-14 ENCOUNTER — Other Ambulatory Visit: Payer: Self-pay

## 2020-06-14 VITALS — BP 144/82 | Wt 209.4 lb

## 2020-06-14 DIAGNOSIS — N6310 Unspecified lump in the right breast, unspecified quadrant: Secondary | ICD-10-CM

## 2020-06-14 DIAGNOSIS — N6320 Unspecified lump in the left breast, unspecified quadrant: Secondary | ICD-10-CM

## 2020-06-14 DIAGNOSIS — Z1239 Encounter for other screening for malignant neoplasm of breast: Secondary | ICD-10-CM

## 2020-06-14 DIAGNOSIS — N644 Mastodynia: Secondary | ICD-10-CM

## 2020-06-14 NOTE — Progress Notes (Signed)
[name] is a 50 y.o. female who presents to [facility] clinic today with complaint of right breast mass x 3 months that is painful. Patient states the pain comes and goes and is worse at night. Patient rates the pain at a 6-7 out of 10.    Pap Smear: Pap smear not completed today. Last Pap smear was 06/06/2011 at [facility] and normal. Per patient has a history of an abnormal Pap smear in 1992 that required a LEEP for follow-up. Per patient all Pap smears have been normal since LEEP. Patient has a history of a hysterectomy 12/25/2012 due to fibroids. Patient no longer needs Pap smears due to her history of a hysterectomy for benign reasons per BCCCP and ASCCP guidelines. Last Pap smear result is in EPIC.   Physical exam: Breasts Breasts symmetrical. No skin abnormalities bilateral breasts. No nipple retraction bilateral breasts. No nipple discharge bilateral breasts. No lymphadenopathy. No lumps palpated bilateral breasts. Unable to palpate a lump in patients area of concern within the right breast. Complaints of right upper inner quadrant pain on exam.       Pelvic/Bimanual Pap is not indicated today per BCCCP guidelines.   Smoking History: Patientis a current smoker. Discussed smoking cessation with patient. Referred patient to the [facility] Quitline and gave resources to free smoking cessation classes at [facility].   Patient Navigation: Patient education provided. Access to services provided for patient through BCCCP program.    Breast and Cervical Cancer Risk Assessment: Patient hasafamily history of her maternal grandmother and a paternal aunt havingbreast cancer. Patient has noknown genetic mutations or history ofradiation treatment to the chest before age 35. Per patient hasahistory of cervical dysplasia. Patient has no history of beingimmunocompromised or DES exposure in-utero.  Risk Assessment    Risk Scores      06/14/2020 05/19/2019   Last edited by: Demetrius Revel, LPN Zalia Hautala, Heath Gold, RN   5-year risk: 0.7 % 0.7 %   Lifetime risk: 7.5 % 7.6 %          A: BCCCP exam without pap smear Complaint of right breast mass and pain.  P: Referred patient to the [facility] for a diagnostic mammogram. Appointment scheduled Tuesday, June 14, 2020 at 0910.  Loletta Parish, RN 06/14/2020 8:14 AM

## 2020-06-14 NOTE — Patient Instructions (Addendum)
Explained breast self awareness with [name]. Patient did not need a Pap smear today due to patient has a history of a hysterectomy for benign reasons. Let her know that she doesn't need any further Pap smears due to her history of a hysterectomy for benign reasons. Referred patient to the [facility] for a diagnostic mammogram. Appointment scheduled Tuesday, June 14, 2020 at 0910. Patient aware of appointment and will be there. Discussed smoking cessation with patient. Referred patient to the [facility] Quitline and gave resources to free smoking cessation classes at [facility]. Kristen Moore verbalized understanding.  Kristen Moore, Arvil Chaco, RN 8:15 AM

## 2020-06-22 ENCOUNTER — Other Ambulatory Visit: Payer: Self-pay

## 2020-06-22 ENCOUNTER — Ambulatory Visit (INDEPENDENT_AMBULATORY_CARE_PROVIDER_SITE_OTHER): Payer: No Payment, Other | Admitting: Licensed Clinical Social Worker

## 2020-06-22 DIAGNOSIS — F418 Other specified anxiety disorders: Secondary | ICD-10-CM

## 2020-06-25 NOTE — Progress Notes (Signed)
THERAPIST PROGRESS NOTE   Virtual Visit via Video Note  I connected with Kristen Moore on 06/22/20 at  9:00 AM EST by a video enabled telemedicine application and verified that I am speaking with the correct person using two identifiers.  Location: Patient: Home Provider: Home d/t inclement weather   I discussed the limitations of evaluation and management by telemedicine and the availability of in person appointments. The patient expressed understanding and agreed to proceed. I discussed the assessment and treatment plan with the patient. The patient was provided an opportunity to ask questions and all were answered. The patient agreed with the plan and demonstrated an understanding of the instructions.  I provided 45 minutes of non-face-to-face time during this encounter.  Participation Level: Active  Behavioral Response: CasualAlertAnxious and Depressed  Type of Therapy: Individual Therapy  Treatment Goals addressed: Communication: Depression/Anxiety/Coping  Interventions: Motivational Interviewing and Supportive  Summary: Kristen Moore is a 50 y.o. female who presents with hx of anx/dep. This date pt signs on for video session per her preference. She is in her space in the attic for privacy. Pt vents about her work situation. She states she worked yesterday, walking to work and how "daunting" that is given the ice. Pt frustrated with [employer]. 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 Pt hopeful that will work out. She advises she would have to use the bus for that job since not in walking distance but is wanting increased hours with the potential for benefits. LCSW provided encouragement and commended her for applying/job searching. Pt states she did go to appt for lump in breast. She provides details re f/u mammogram and ultrasound. Reports she was told she has cluster cysts in R breast and one cyst in L breast. She  is relieved she does not have CA. Plan is to f/u again in 6 mon. LCSW assessed for outcome of court r/t grandchild. Pt angry, anx and depressed over situation. Pt advises "There were lies told in court by DSS SW". Pt informs all visitation "suspended" for her and her mother. She states DSS SW also called her after court, which was done virtually, to tell her she is not to have any additional communication with foster parents. Pt feels SW was "threatening" in the way she spoke to her. Pt states she was furious and "went off on her" and then "hung up on her". LCSW assisted pt to process thoughts/feelings/choices. Pt has some insight her choices were not helpful. She does not know what next steps will be re visits being "suspended". LCSW assessed for status of meds. Pt informs she has all meds and is taking as prescribed but she could not contain her anger with DSS SW d/t tone and wording. Pt plans to see how visitation might be reinstated. LCSW assessed for pt making written list of the things she is willing to change to improve her personal circumstances, reduce stress. Pt states she did not make a list. She does say she has learned her housing voucher is extended to April and she is going to look for her own place to live. LCSW encouraged her to follow through on this search. LCSW reviewed coping strategies and poc prior to close of session. Wished pt well for interview. Pt states appreciation for care.   Suicidal/Homicidal: Nowithout intent/plan  Therapist Response: Pt remains receptive to care.  Plan: Return again in ~ 4 weeks or walk in prn.  Diagnosis: Axis I: Anxeity with depression    Axis II: Deferred  [name], LCSW 06/25/2020

## 2020-06-30 ENCOUNTER — Other Ambulatory Visit: Payer: Self-pay

## 2020-06-30 DIAGNOSIS — Z20822 Contact with and (suspected) exposure to covid-19: Secondary | ICD-10-CM

## 2020-07-01 LAB — NOVEL CORONAVIRUS, NAA: SARS-CoV-2, NAA: NOT DETECTED

## 2020-07-01 LAB — SARS-COV-2, NAA 2 DAY TAT

## 2020-07-25 ENCOUNTER — Telehealth (HOSPITAL_COMMUNITY): Payer: Self-pay | Admitting: Licensed Clinical Social Worker

## 2020-07-25 ENCOUNTER — Other Ambulatory Visit: Payer: Self-pay

## 2020-07-25 ENCOUNTER — Ambulatory Visit (HOSPITAL_COMMUNITY): Payer: No Payment, Other | Admitting: Licensed Clinical Social Worker

## 2020-07-25 NOTE — Telephone Encounter (Signed)
LCSW sent text link for video session per schedule. When pt failed to sign on LCSW called pt. Call did not ring, went straight to a very fast busy signal. LCSW waited and tried the call again with same outcome. No ability to lvm.

## 2020-08-01 ENCOUNTER — Telehealth (HOSPITAL_COMMUNITY): Payer: No Payment, Other | Admitting: Psychiatry

## 2020-08-01 ENCOUNTER — Other Ambulatory Visit: Payer: Self-pay

## 2020-08-06 IMAGING — MG DIGITAL DIAGNOSTIC BILAT W/ TOMO W/ CAD
8 series · 8 of 24 positions shown · non-contrast
Comparison: Previous exam(s).

CLINICAL DATA: Patient complains of diffuse bilateral breast pain.

EXAM:
DIGITAL DIAGNOSTIC BILATERAL MAMMOGRAM WITH CAD AND TOMO

[L MLO synth-2D]
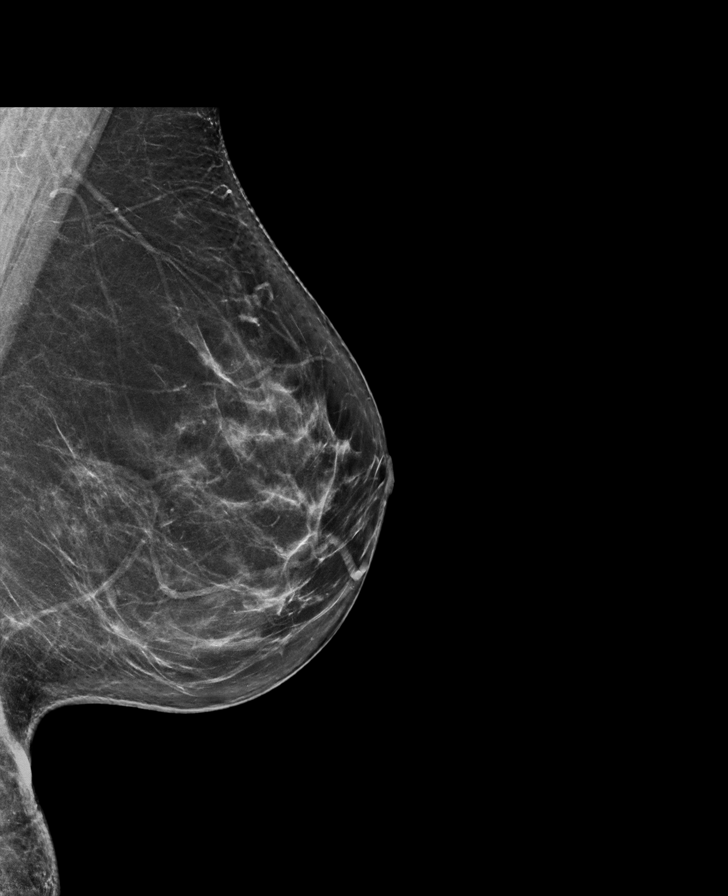

[R MLO synth-2D]
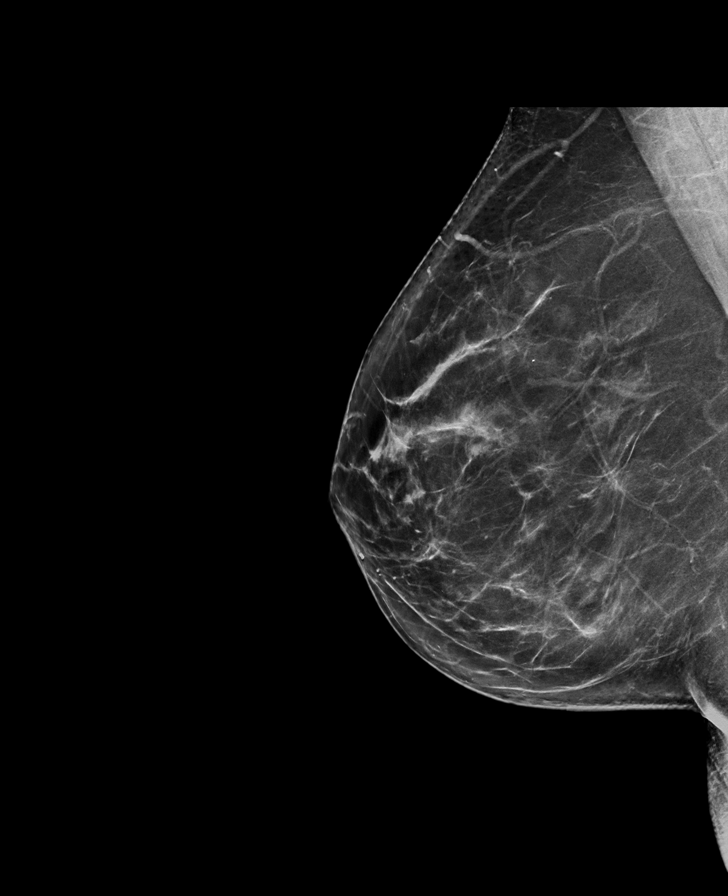

[L CC synth-2D]
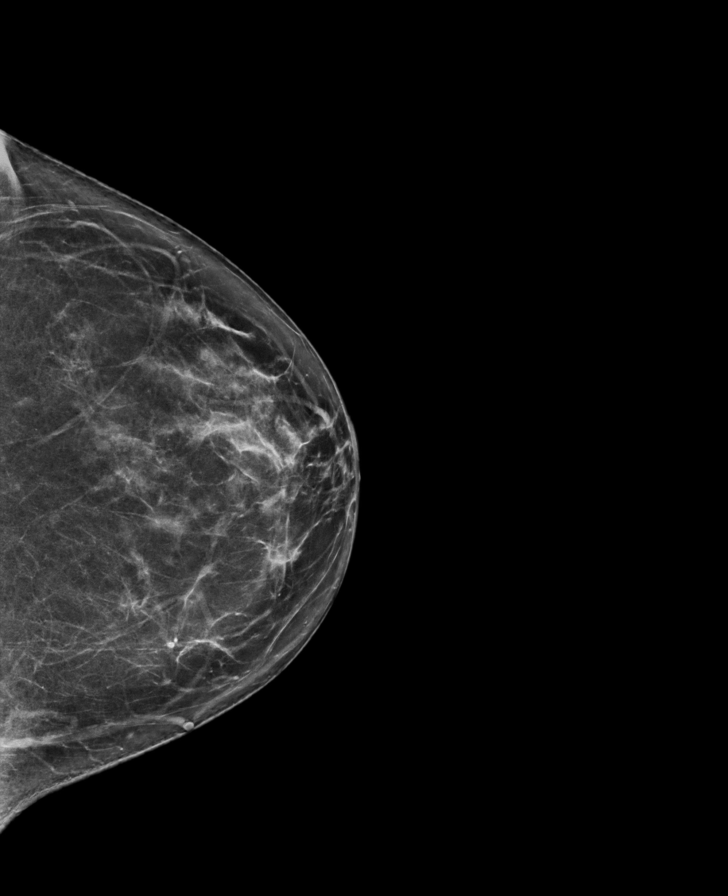

[R CC synth-2D]
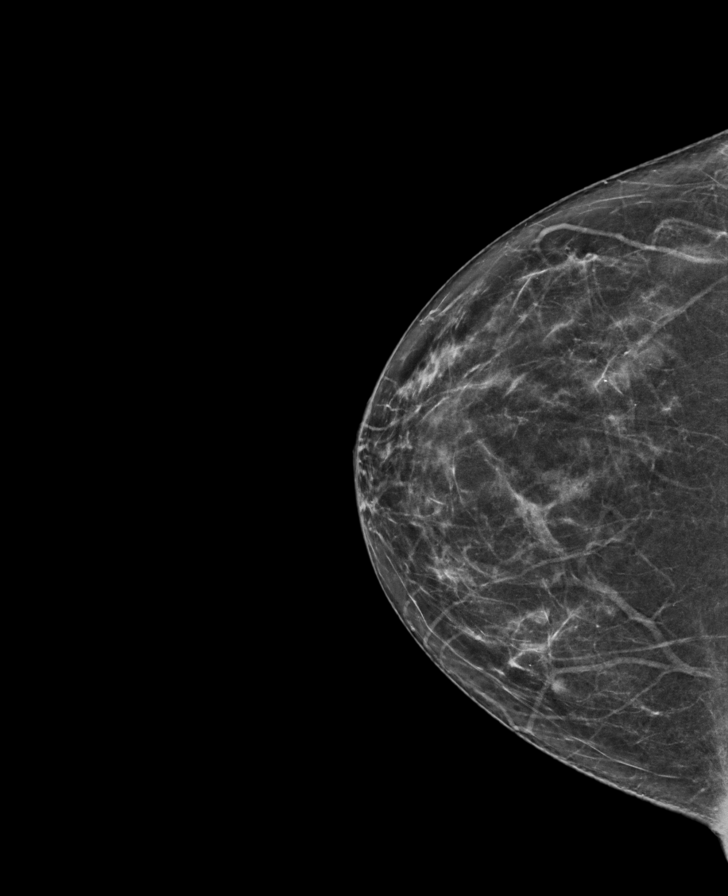

[L CC tomo · tomo slice 39/77.0]
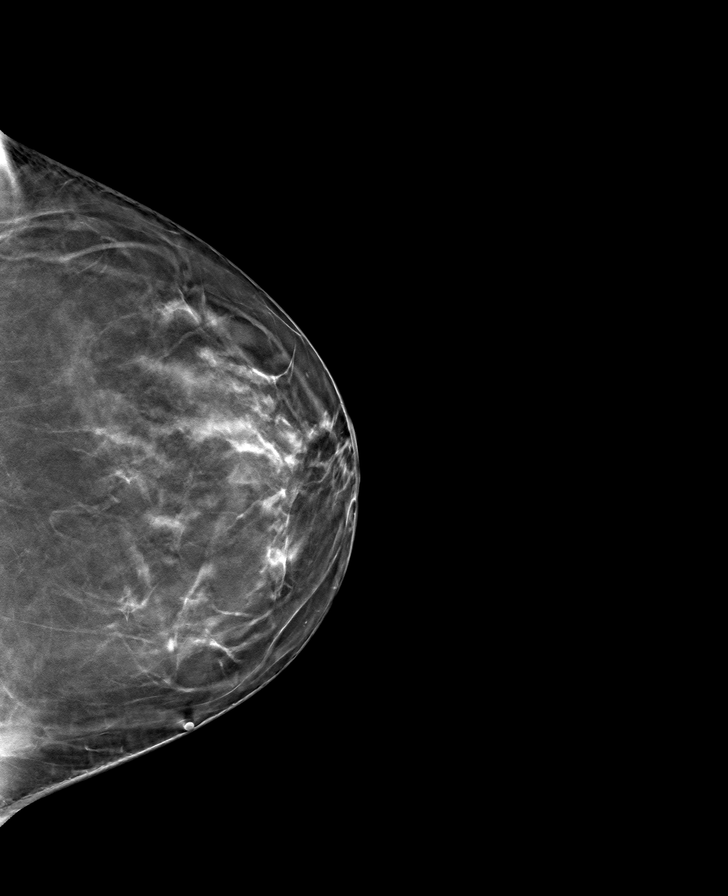

[L MLO tomo · tomo slice 39/78.0]
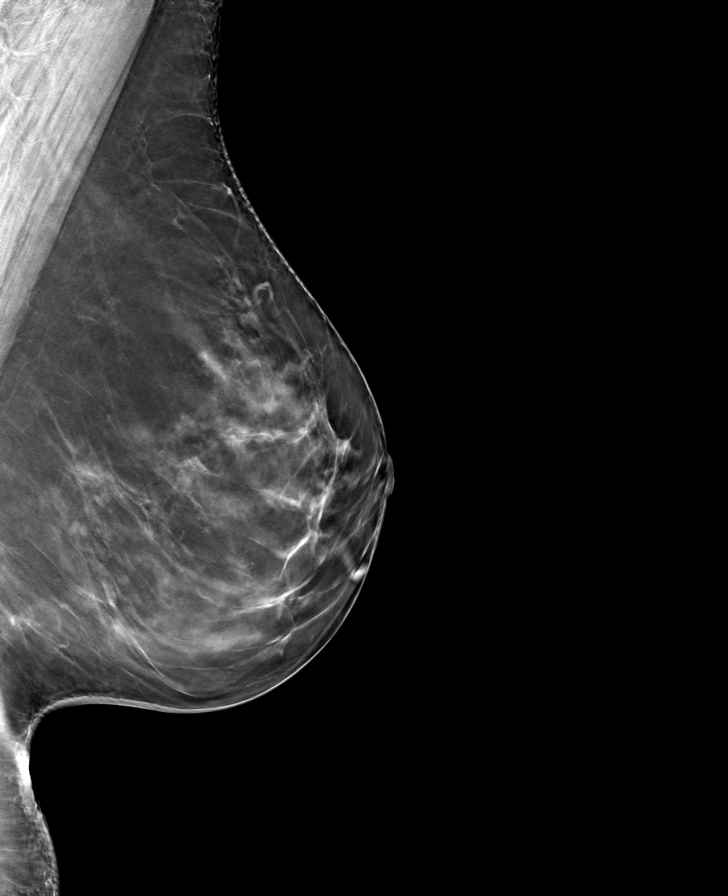

[R CC tomo · tomo slice 37/73.0]
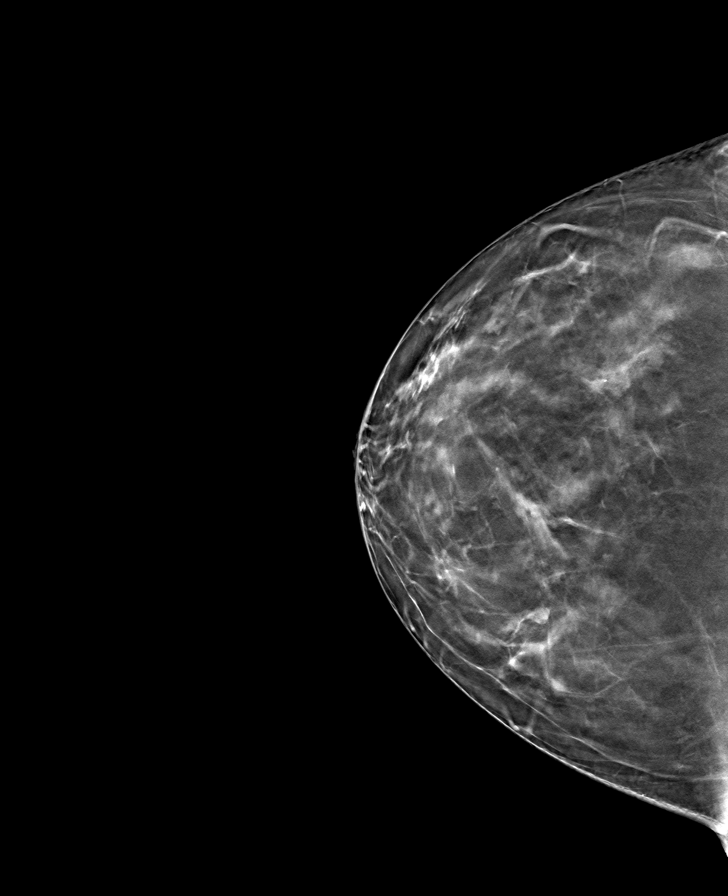

[R MLO tomo · tomo slice 42/83.0]
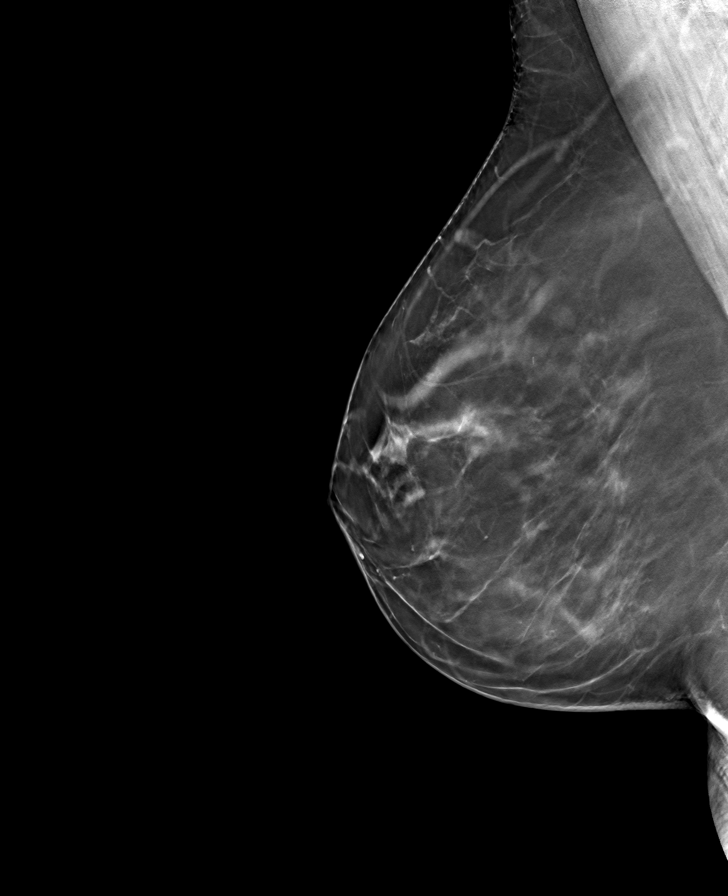

[8 of 24 positions shown; findings below may reference images not displayed]

ACR Breast Density Category b: There are scattered areas of
fibroglandular density.
FINDINGS: No suspicious mass, malignant type microcalcifications or distortion
detected in either breast.

Mammographic images were processed with CAD.
IMPRESSION: No evidence of malignancy in either breast.

RECOMMENDATION:
Bilateral screening mammogram in 1 year is recommended.

I have discussed the findings and recommendations with the patient.
If applicable, a reminder letter will be sent to the patient
regarding the next appointment.

BI-RADS CATEGORY  1: Negative.

## 2020-08-15 ENCOUNTER — Ambulatory Visit (INDEPENDENT_AMBULATORY_CARE_PROVIDER_SITE_OTHER): Payer: No Payment, Other | Admitting: Licensed Clinical Social Worker

## 2020-08-15 ENCOUNTER — Other Ambulatory Visit: Payer: Self-pay

## 2020-08-15 DIAGNOSIS — F418 Other specified anxiety disorders: Secondary | ICD-10-CM | POA: Diagnosis not present

## 2020-08-16 NOTE — Progress Notes (Signed)
THERAPIST PROGRESS NOTE   Virtual Visit via Video Note  I connected with Kristen Moore on 08/15/20 at 10:00 AM EDT by a video enabled telemedicine application and verified that I am speaking with the correct person using two identifiers.  Location: Patient: Home Provider: [facility]   I discussed the limitations of evaluation and management by telemedicine and the availability of in person appointments. The patient expressed understanding and agreed to proceed. I discussed the assessment and treatment plan with the patient. The patient was provided an opportunity to ask questions and all were answered. The patient agreed with the plan and demonstrated an understanding of the instructions.  I provided 40 minutes of non-face-to-face time during this encounter.  Participation Level: Active  Behavioral Response: CasualAlertAnxious and Depressed  Type of Therapy: Individual Therapy  Treatment Goals addressed: Communication: dep/anx/coping  Interventions: Solution Focused, Supportive and Reframing  Summary: Kristen Moore is a 50 y.o. female who presents with hx of anx/dep. This date pt signs on for video session per her preference. Pt last seen 01/19. LCSW assessed for overall status and changes. Pt reports she continues to work at [employer]. She decided not to try for the job at [employer] d/t location and having to take the bus. Pt states "I feel like my life is in shambles". She states her anx is "10" on 0-10 scale with 10 the worst. She reports her mother had a bad reaction to last chemo and she is to go to appt with mother next wk to see what the options are for her care. She states father continues to need care and revisits how "mean" he is. She advises her developmentally disabled brother, Kristen Moore, is an ongoing challenge. Pt provides updates on her dtr, Kristen Moore, who continues to be mostly on the streets doing drugs and prostituting. Kristen Moore is coming in and out of the home to  get food/rest. Pt vents about how poorly Kristen Moore is doing. Pt reports she got her income tax and bought her dtr a phone. She denies she will be paying phone bill but felt dtr needed phone for safety. LCSW assessed for pt giving dtr cash. Pt states she did give her $55. Facilitated detailed review of this choice and how many times this choice has been discussed r/t dtr's addiction. Pt again vows she will not give her cash going forward and states she knows dtr uses it for drugs. Pt gives update on grandtr in DSS custody pt is still unable to visit with and how distressing this is. Pt states she is fearful of something happening to her mother saying she cannot continue to live in the home with father and brother. Assessed for status of pt's search for housing and section 8 voucher. Pt advises voucher will expire in April for good if not used. Strongly encouraged pt to pursue her own housing so she will have a choice of where to be. Pt reports she has a couple of leads and will f/u. She states she feels like she will be abandoning mother, which was addressed in detail. Pt reports she is taking meds as prescribed. Missed last med appt and has no refills. LCSW facilitated pt getting back on schedule for med management. Remainder of session spent addressing effective coping skills. Today pt shares she does have a long time female friend, Kristen Moore, who she goes out to eat with and vents to. She states they were school mates and "he has a lot of problems too". She denies  a romantic relationship. LCSW reviewed poc including scheduling prior to close of session. Pt states appreciation for care.      Suicidal/Homicidal: Nowithout intent/plan  Therapist Response: Pt remains receptive to care.  Plan: Return again for next avail appt.  Diagnosis: Axis I: anxiety with depression    Axis II: Deferred  [name], LCSW 08/16/2020

## 2020-08-18 ENCOUNTER — Other Ambulatory Visit (INDEPENDENT_AMBULATORY_CARE_PROVIDER_SITE_OTHER): Payer: Self-pay | Admitting: Primary Care

## 2020-08-18 ENCOUNTER — Other Ambulatory Visit (HOSPITAL_COMMUNITY): Payer: Self-pay | Admitting: Psychiatry

## 2020-08-18 DIAGNOSIS — F319 Bipolar disorder, unspecified: Secondary | ICD-10-CM

## 2020-08-18 DIAGNOSIS — E7841 Elevated Lipoprotein(a): Secondary | ICD-10-CM

## 2020-08-18 DIAGNOSIS — E039 Hypothyroidism, unspecified: Secondary | ICD-10-CM

## 2020-08-18 NOTE — Telephone Encounter (Signed)
Requested medication (s) are due for refill today: yes  Requested medication (s) are on the active medication list: yes  Last refill:  07/21/2020  Future visit scheduled:  no  Notes to clinic:  message has been sent to patient to contact office for appointments Patient already given courtesy refill  Please advise    Requested Prescriptions  Pending Prescriptions Disp Refills   levothyroxine (SYNTHROID) 150 MCG tablet [Pharmacy Med Name: Levothyroxine Sodium 150MCG TABS] 30 tablet     Sig: Take 1 tablet (150 mcg total) by mouth daily.      Endocrinology:  Hypothyroid Agents Failed - 08/18/2020 12:00 PM      Failed - TSH needs to be rechecked within 3 months after an abnormal result. Refill until TSH is due.      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      Cardiovascular:  Antilipid - Statins Failed - 08/18/2020 12:00 PM      Failed - Total Cholesterol in normal range and within 360 days    Cholesterol, Total  Date Value Ref Range Status  06/30/2019 242 (H) 100 - 199 mg/dL Final          Failed - LDL in normal range and within 360 days     LDL Chol Calc (NIH)  Date Value Ref Range Status  06/30/2019 145 (H) 0 - 99 mg/dL Final          Failed - HDL in normal range and within 360 days    HDL  Date Value Ref Range Status  06/30/2019 46 >39 mg/dL Final          Failed - Triglycerides in normal range and within 360 days    Triglycerides  Date Value Ref Range Status  06/30/2019 279 (H) 0 - 149 mg/dL Final          Passed - Patient is not pregnant      Passed - Valid encounter within last 12 months    Recent Outpatient Visits           3 months ago Screening for diabetes mellitus   [place], Michelle P, NP   11 months ago Blood in stool   [place] Kerin Perna, NP   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

## 2020-08-23 NOTE — Telephone Encounter (Signed)
Needs labs/appt 

## 2020-08-24 ENCOUNTER — Ambulatory Visit (INDEPENDENT_AMBULATORY_CARE_PROVIDER_SITE_OTHER): Payer: Self-pay | Admitting: *Deleted

## 2020-08-24 NOTE — Telephone Encounter (Signed)
Summary: Clinical Advice   Patient experiencing left side abdominal pain and no other symptoms. Patient seeking clinical advice and has a future appointment for Monday 08/29/2020      Call to patient- patient states she has ahd left sided abdominal pain that comes and goes for 3 days. Patient states pain is a throbbing pain that can be prolonged- rates 7-8. Patient states during the 3 day period she has had nausea once and rectal bleeding from hemorrhoids. Patient has not tried anything OTC to help relieve her symptoms. Appointment scheduled  Reason for Disposition . [1] MODERATE pain (e.g., interferes with normal activities) AND [2] pain comes and goes (cramps) AND [3] present > 24 hours  (Exception: pain with Vomiting or Diarrhea - see that Guideline)  Answer Assessment - Initial Assessment Questions 1. LOCATION: "Where does it hurt?"      Lower left side 2. RADIATION: "Does the pain shoot anywhere else?" (e.g., chest, back)     Radiates to middle of back 3. ONSET: "When did the pain begin?" (e.g., minutes, hours or days ago)      3 days ago 4. SUDDEN: "Gradual or sudden onset?"     sudden 5. PATTERN "Does the pain come and go, or is it constant?"    - If constant: "Is it getting better, staying the same, or worsening?"      (Note: Constant means the pain never goes away completely; most serious pain is constant and it progresses)     - If intermittent: "How long does it last?" "Do you have pain now?"     (Note: Intermittent means the pain goes away completely between bouts)     Comes and goes, can last all day 6. SEVERITY: "How bad is the pain?"  (e.g., Scale 1-10; mild, moderate, or severe)   - MILD (1-3): doesn't interfere with normal activities, abdomen soft and not tender to touch    - MODERATE (4-7): interferes with normal activities or awakens from sleep, tender to touch    - SEVERE (8-10): excruciating pain, doubled over, unable to do any normal activities      Moderate/severe-  throbbing pain 7. RECURRENT SYMPTOM: "Have you ever had this type of stomach pain before?" If Yes, ask: "When was the last time?" and "What happened that time?"      no 8. CAUSE: "What do you think is causing the stomach pain?"     No sure 9. RELIEVING/AGGRAVATING FACTORS: "What makes it better or worse?" (e.g., movement, antacids, bowel movement)     Sitting hurts worse 10. OTHER SYMPTOMS: "Has there been any vomiting, diarrhea, constipation, or urine problems?"       Nausea 2 days ago, burning with urination, rectal bleeding- off/on 11. PREGNANCY: "Is there any chance you are pregnant?" "When was your last menstrual period?"       n/a  Protocols used: ABDOMINAL PAIN - [facility]

## 2020-08-25 ENCOUNTER — Ambulatory Visit (INDEPENDENT_AMBULATORY_CARE_PROVIDER_SITE_OTHER): Payer: No Typology Code available for payment source | Admitting: Primary Care

## 2020-08-25 ENCOUNTER — Other Ambulatory Visit (INDEPENDENT_AMBULATORY_CARE_PROVIDER_SITE_OTHER): Payer: Self-pay | Admitting: Primary Care

## 2020-08-25 DIAGNOSIS — E039 Hypothyroidism, unspecified: Secondary | ICD-10-CM

## 2020-08-25 DIAGNOSIS — E7841 Elevated Lipoprotein(a): Secondary | ICD-10-CM

## 2020-08-29 ENCOUNTER — Telehealth (INDEPENDENT_AMBULATORY_CARE_PROVIDER_SITE_OTHER): Payer: Self-pay | Admitting: Primary Care

## 2020-08-29 ENCOUNTER — Ambulatory Visit (INDEPENDENT_AMBULATORY_CARE_PROVIDER_SITE_OTHER): Payer: Self-pay | Admitting: Primary Care

## 2020-08-29 ENCOUNTER — Encounter (INDEPENDENT_AMBULATORY_CARE_PROVIDER_SITE_OTHER): Payer: Self-pay

## 2020-08-29 ENCOUNTER — Other Ambulatory Visit: Payer: Self-pay

## 2020-08-29 NOTE — Telephone Encounter (Signed)
Called patient because I received an email from the [facility] saying that the patient was very upset because of the way she was treated at [facility]. Patient did not answer and I left a voice mail for the patient to return call.

## 2020-09-05 ENCOUNTER — Ambulatory Visit: Payer: Self-pay

## 2020-09-05 ENCOUNTER — Other Ambulatory Visit: Payer: Self-pay

## 2020-09-09 ENCOUNTER — Other Ambulatory Visit: Payer: Self-pay

## 2020-09-09 ENCOUNTER — Telehealth (INDEPENDENT_AMBULATORY_CARE_PROVIDER_SITE_OTHER): Payer: No Payment, Other | Admitting: Psychiatry

## 2020-09-09 ENCOUNTER — Encounter (HOSPITAL_COMMUNITY): Payer: Self-pay | Admitting: Psychiatry

## 2020-09-09 ENCOUNTER — Ambulatory Visit: Payer: Self-pay | Attending: Primary Care

## 2020-09-09 DIAGNOSIS — F411 Generalized anxiety disorder: Secondary | ICD-10-CM | POA: Diagnosis not present

## 2020-09-09 DIAGNOSIS — F319 Bipolar disorder, unspecified: Secondary | ICD-10-CM | POA: Diagnosis not present

## 2020-09-09 MED ORDER — BUSPIRONE HCL 10 MG PO TABS
10.0000 mg | ORAL_TABLET | Freq: Three times a day (TID) | ORAL | 2 refills | Status: DC
Start: 2020-09-09 — End: 2020-12-08
  Filled 2020-09-30 – 2020-10-13 (×3): qty 90, 30d supply, fill #0

## 2020-09-09 MED ORDER — LITHIUM CARBONATE 300 MG PO CAPS
ORAL_CAPSULE | ORAL | 2 refills | Status: DC
  Filled 2020-09-30 – 2020-10-13 (×2): qty 90, 30d supply, fill #0
  Filled 2020-11-16: qty 90, 30d supply, fill #1

## 2020-09-09 MED ORDER — HYDROXYZINE HCL 10 MG PO TABS
5.0000 mg | ORAL_TABLET | Freq: Three times a day (TID) | ORAL | 2 refills | Status: DC | PRN
  Filled 2020-09-30 – 2020-10-13 (×2): qty 45, 30d supply, fill #0

## 2020-09-09 MED ORDER — BUPROPION HCL ER (XL) 300 MG PO TB24
300.0000 mg | ORAL_TABLET | Freq: Every day | ORAL | 2 refills | Status: DC
  Filled 2020-09-30 – 2020-10-13 (×2): qty 30, 30d supply, fill #0
  Filled 2020-11-16: qty 30, 30d supply, fill #1

## 2020-09-09 NOTE — Progress Notes (Signed)
[place] MD/PA/NP OP Progress Note Virtual Visit via Video Note  I connected with Kristen Moore on 09/09/20 at 11:30 AM EDT by a video enabled telemedicine application and verified that I am speaking with the correct person using two identifiers.  Location: Patient: Home Provider: Clinic   I discussed the limitations of evaluation and management by telemedicine and the availability of in person appointments. The patient expressed understanding and agreed to proceed.  I provided 30 minutes of non-face-to-face time during this encounter.      09/09/2020 12:01 PM Kristen Moore  MRN:  [id number]  Chief Complaint: "I just have a lot of stressors and anxiety"   HPI: 50 year old female seen today for follow up psychiatric evaluation. She has a psychiatric history of anxiety, depression, and bipolar disorder. She is currently being managed on Wellbutrin XL 300 mg daily, hydroxyzine 5 mg three times daily, and lithium 300 mg in the morning and 600 mg nightly. She noted that her medications are somewhat effective in managing her psychiatric conditions.   Today she was pleasant, cooperative, and engaged in [place].  She informed provider that she occasionally has depression and anxiety due to life stressors. She informed Probation officer she has increased stress and anxiety. She notes that her mother has stage 4 cancer, her father has cancer, her daughter continues to abuse illegal substances, and she has not been able to see her granddaughter since December. She notes that she is also concerned about her house (noting she lives with her parents but reports the house is not in the best of shape), her job, health, and finances. She notes that she has issues with her thyroid and fears kidney damage due to beinig on lithium.Patient notes that she is afraid of the long term affects of Lithium. She notes that she has been on it for over 10 years and would like to discontinue it in the future and switch to  another mood stabalizer. Provider discussed the affects of lithium and informed patient of side effects of other mood stabilizers.   Patient notes that due to the above stressors she has increased anxiety and mild depression. Today provider conducted a GAD 7 and patient scored a 21. Provider also conducted a PHQ 9 and patient scored an 11. She notes that lately she has had poor self esteems and does not find her self as attractive as she did in the past. Today she denies adequate sleep and appetite. She denies SI/HI/VAH or paranoia. Patient endorses irritability but denies other symptoms of mania.  Today she is agreeable to start Buspar 10 mg three times daily to help manage anxiety and depression.  Potential side effects of medication and risks vs benefits of treatment vs non-treatment were explained and discussed. All questions were answered. At this time lithium not tapered or readjusted. Patient may consider starting another mood stabilizer such as Abilify, Seroquel, or trileptal in the past. She will continue all other medications as prescribes.  She will also follow up with outpatient counselor for therapy. No other concerns noted at this time.   .   Visit Diagnosis:    ICD-10-CM   1. Bipolar depression ([place])  F31.9 lithium carbonate 300 MG capsule    buPROPion (WELLBUTRIN XL) 300 MG 24 hr tablet    busPIRone (BUSPAR) 10 MG tablet  2. Generalized anxiety disorder  F41.1 hydrOXYzine (ATARAX/VISTARIL) 10 MG tablet    busPIRone (BUSPAR) 10 MG tablet    Past Psychiatric History:  anxiety, depression, and  bipolar disorder.  Past Medical History:  Past Medical History:  Diagnosis Date  . Abnormal Pap smear    cryo  . Anemia   . Asthma   . Bipolar 1 disorder ([place])   . Cyst of breast    left breast  . DUB (dysfunctional uterine bleeding)   . Fibroids   . Headache(784.0)   . Hypothyroidism   . IBS (irritable bowel syndrome)   . Thyroid dysfunction     Past Surgical History:   Procedure Laterality Date  . ABDOMINAL HYSTERECTOMY Bilateral 12/25/2012   Procedure: HYSTERECTOMY ABDOMINAL;  Surgeon: Emily Filbert, MD;  Location: [facility];  Service: Gynecology;  Laterality: Bilateral;  TAH and bilateral salpingectomy  . BILATERAL SALPINGECTOMY Bilateral 12/25/2012   Procedure: BILATERAL SALPINGECTOMY;  Surgeon: Emily Filbert, MD;  Location: [facility];  Service: Gynecology;  Laterality: Bilateral;  . CERVICAL BIOPSY  W/ LOOP ELECTRODE EXCISION    . CHOLECYSTECTOMY    . COLON SURGERY    . DILATION AND CURETTAGE OF UTERUS    . OOPHORECTOMY Bilateral 12/25/2012   Procedure: OOPHORECTOMY;  Surgeon: Emily Filbert, MD;  Location: [facility];  Service: Gynecology;  Laterality: Bilateral;  . TUBAL LIGATION      Family Psychiatric History: Daughter substance abuse  Family History:  Family History  Problem Relation Age of Onset  . Heart disease Father   . Cancer Father 57       lung  . Hypertension Mother   . Rheum arthritis Mother   . Fibromyalgia Mother   . COPD Mother   . Sleep apnea Mother   . Thyroid disease Mother   . Breast cancer Maternal Grandmother   . Breast cancer Paternal Aunt     Social History:  Social History   Socioeconomic History  . Marital status: Single    Spouse name: Not on file  . Number of children: 1  . Years of education: Not on file  . Highest education level: Associate degree: occupational, Hotel manager, or vocational program  Occupational History  . Not on file  Tobacco Use  . Smoking status: Current Every Day Smoker    Packs/day: 0.50    Years: 9.00    Pack years: 4.50    Types: Cigarettes  . Smokeless tobacco: Never Used  Vaping Use  . Vaping Use: Never used  Substance and Sexual Activity  . Alcohol use: No    Alcohol/week: 0.0 standard drinks  . Drug use: No  . Sexual activity: Not Currently    Birth control/protection: Surgical  Other Topics Concern  . Not on file  Social History Narrative   Mother has ovarian cancer that has  spread to lungs, Dad has lung cancer that has spread to kidneys, and disabled brother all live in home with patient. Patient is primary caregiver. Daughter is drug addict, and has loss custody of patient's infant granddaughter in 02/2020. Patient works as a Scientist, water quality. Patient is very stressed and saddened about her current situation.    Social Determinants of Health   Financial Resource Strain: Not on file  Food Insecurity: Not on file  Transportation Needs: No Transportation Needs  . Lack of Transportation (Medical): No  . Lack of Transportation (Non-Medical): No  Physical Activity: Not on file  Stress: Not on file  Social Connections: Not on file    Allergies:   Metabolic Disorder Labs: Lab Results  Component Value Date   HGBA1C 5.1 04/25/2020   MPG 88.19 04/18/2018   No results found  for: PROLACTIN Lab Results  Component Value Date   CHOL 242 (H) 06/30/2019   TRIG 279 (H) 06/30/2019   HDL 46 06/30/2019   CHOLHDL 5.3 (H) 06/30/2019   VLDL 56 (H) 04/18/2018   LDLCALC 145 (H) 06/30/2019   LDLCALC 124 (H) 04/18/2018   Lab Results  Component Value Date   TSH 0.485 06/30/2019   TSH 0.615 08/20/2018    Therapeutic Level Labs: Lab Results  Component Value Date   LITHIUM 0.3 (L) 04/30/2018   LITHIUM 0.58 (L) 07/17/2010   No results found for: VALPROATE No components found for:  CBMZ  Current Medications: Current Outpatient Medications  Medication Sig Dispense Refill  . busPIRone (BUSPAR) 10 MG tablet Take 1 tablet (10 mg total) by mouth 3 (three) times daily. 90 tablet 2  . atorvastatin (LIPITOR) 40 MG tablet TAKE 1 TABLET (40 MG TOTAL) BY MOUTH DAILY. 30 tablet 0  . BLACK COHOSH EXTRACT PO Take 2 tablets by mouth daily.    Marland Kitchen buPROPion (WELLBUTRIN XL) 300 MG 24 hr tablet Take 1 tablet (300 mg total) by mouth daily. 30 tablet 2  . hydrOXYzine (ATARAX/VISTARIL) 10 MG tablet Take 0.5 tablets (5 mg total) by mouth 3 (three) times daily as needed. 45 tablet 2  .  levothyroxine (SYNTHROID) 150 MCG tablet TAKE 1 TABLET (150 MCG TOTAL) BY MOUTH DAILY. 30 tablet 0  . lithium carbonate 300 MG capsule TAKE 1 CAPSULE BY MOUTH EVERY MORNING & TAKE 2 CAPSULES EVERY NIGHT AT BEDTIME 90 capsule 2  . Multiple Vitamin (MULTIVITAMIN) capsule Take 2 capsules by mouth daily.     . Multiple Vitamins-Minerals (ZINC PO) Take 2 tablets by mouth daily.    Marland Kitchen OVER THE COUNTER MEDICATION Take 2 tablets by mouth daily. Sea moss vitamin     No current facility-administered medications for this visit.     Musculoskeletal: Strength & Muscle Tone: Unable to assess due to telehealth visit [place]: Unable to assess due to telehealth visit Patient leans: N/A  Psychiatric Specialty Exam: Review of Systems  Last menstrual period 11/29/2012.There is no height or weight on file to calculate BMI.  General Appearance: Well Groomed  Eye Contact:  Good  Speech:  Clear and Coherent and Normal Rate  Volume:  Normal  Mood:  Anxious, Depressed and Irritable  Affect:  Appropriate and Congruent  Thought Process:  Coherent, Goal Directed and Linear  Orientation:  Full (Time, Place, and Person)  Thought Content: WDL and Logical   Suicidal Thoughts:  No  Homicidal Thoughts:  No  Memory:  Immediate;   Good Recent;   Good Remote;   Good  Judgement:  Good  Insight:  Good  Psychomotor Activity:  Normal  Concentration:  Concentration: Good and Attention Span: Good  Recall:  Good  Fund of Knowledge: Good  Language: Good  Akathisia:  No  Handed:  Right  AIMS (if indicated): Not done  Assets:  Communication Skills Desire for Improvement Financial Resources/Insurance Housing Social Support  ADL's:  Intact  Cognition: WNL  Sleep:  Good   Screenings: GAD-7   Flowsheet Row Video Visit from 09/09/2020 in [facility] Office Visit from 04/25/2020 in [place] Counselor from 12/17/2019 in [facility]  Office Visit from 09/01/2019 in [place] Office Visit from 05/07/2019 in [place]  Total GAD-7 Score 21 6 19 5 15     PHQ2-9   Flowsheet Row Video  Visit from 09/09/2020 in [facility] Office Visit from 04/25/2020 in [place] Counselor from 12/17/2019 in [facility] Office Visit from 09/01/2019 in [place] Office Visit from 05/07/2019 in [place]  PHQ-2 Total Score 3 2 6 1 3   PHQ-9 Total Score 11 6 17  -- 16    Flowsheet Row Video Visit from 09/09/2020 in [facility] Office Visit from 05/07/2019 in [place] No Risk Error: Q3, 4, or 5 should not be populated when Q2 is No       Assessment and Plan: Patient endorses increased anxiety and depression due to life stressors. She also notes that in the future she would like to discontinue lithium due to long term use side effects.  Today she is agreeable to start Buspar 10 mg three times daily to help manage anxiety and depression.   At this time lithium not tapered or readjusted. Patient may consider starting another mood stabilizer such as Abilify, Seroquel, or trileptal in the past. She will continue all other medications as prescribes. 1. Bipolar depression ([place])  Continue- buPROPion (WELLBUTRIN XL) 300 MG 24 hr tablet; Take 1 tablet (300 mg total) by mouth daily.  Dispense: 30 tablet; Refill: 2 Continue- lithium carbonate 300 MG capsule; Take 1-2 capsules (300-600 mg total) by mouth 2 (two) times daily with a meal. Takes 300 mg in the morning and 600 mg at bedtime  Dispense: 90 capsule; Refill: 2 Start- busPIRone (BUSPAR) 10 MG tablet; Take 1 tablet (10 mg total) by mouth 3 (three) times daily.  Dispense: 90 tablet; Refill: 2  2. Generalized anxiety disorder  Continue- hydrOXYzine (ATARAX/VISTARIL)  10 MG tablet; Take 0.5 tablets (5 mg total) by mouth 3 (three) times daily as needed.  Dispense: 45 tablet; Refill: 2 Start- busPIRone (BUSPAR) 10 MG tablet; Take 1 tablet (10 mg total) by mouth 3 (three) times daily.  Dispense: 90 tablet; Refill: 2  Follow up in 3 months Follow up with therapu   Salley Slaughter, NP 09/09/2020, 12:01 PM

## 2020-09-15 ENCOUNTER — Ambulatory Visit (INDEPENDENT_AMBULATORY_CARE_PROVIDER_SITE_OTHER): Payer: No Payment, Other | Admitting: Licensed Clinical Social Worker

## 2020-09-15 ENCOUNTER — Other Ambulatory Visit: Payer: Self-pay

## 2020-09-15 DIAGNOSIS — F418 Other specified anxiety disorders: Secondary | ICD-10-CM

## 2020-09-19 NOTE — Progress Notes (Signed)
   THERAPIST PROGRESS NOTE   Virtual Visit via Telephone Note  I connected with Kristen Moore on 09/15/20 at  2:00 PM EDT by telephone and verified that I am speaking with the correct person using two identifiers.  Location: Patient: Home Provider: [facility]   I discussed the limitations, risks, security and privacy concerns of performing an evaluation and management service by telephone and the availability of in person appointments. I also discussed with the patient that there may be a patient responsible charge related to this service. The patient expressed understanding and agreed to proceed. I discussed the assessment and treatment plan with the patient. The patient was provided an opportunity to ask questions and all were answered. The patient agreed with the plan and demonstrated an understanding of the instructions.  I provided 45 minutes of non-face-to-face time during this encounter.  Participation Level: Active  Behavioral Response: Well GroomedAlertAnxious, Depressed and Dysphoric  Type of Therapy: Individual Therapy  Treatment Goals addressed: Communication: Anx/dep/coping  Interventions: Supportive and Reframing  Summary: Kristen Moore is a 50 y.o. female who presents with hx of anx/dep. Pt signs on for video session. This clinician and see and hear pt but pt cannot see or hear LCSW. Called pt and pt agrees to talk via phone for session. LCSW assessed for last med management appt. Pt sates she has new med for anx and feels it is helping her. She states she feels somewhat less anx and less irritable. Pt reports updates on all family matters with many ongoing family dynamics negatively impacting pt's stress levels. Reports dtr has been "missing" since last Thurs and dtr broke the phone pt got for her. Pt states she has not been giving dtr money. Pt advises her mom is on a new chemo. She continues to do much caregiving and taking parents to appts. Pt states she recently  had a bad kidney infection and has been on antibiotics. She provides updates about work. 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 Pt has not found housing and knows section 8 voucher will be eliminated by end of month if not used. With coaxing pt agrees to call [employer]. Counseling provided on reactions to ongoing stressors/coping. LCSW reviewed poc including scheduling prior to close of session. Pt states appreciation for care.  Suicidal/Homicidal: Nowithout intent/plan  Therapist Response: Pt remains receptive to care.  Plan: Return again for next avail appt.  Diagnosis: Axis I: Anx with dep  [name], LCSW 09/19/2020

## 2020-09-21 ENCOUNTER — Other Ambulatory Visit (INDEPENDENT_AMBULATORY_CARE_PROVIDER_SITE_OTHER): Payer: Self-pay | Admitting: Primary Care

## 2020-09-21 DIAGNOSIS — E039 Hypothyroidism, unspecified: Secondary | ICD-10-CM

## 2020-09-21 DIAGNOSIS — E7841 Elevated Lipoprotein(a): Secondary | ICD-10-CM

## 2020-09-21 NOTE — Telephone Encounter (Signed)
Notes to clinic: overdue for follow up appointment  Review for refills    Requested Prescriptions  Pending Prescriptions Disp Refills   levothyroxine (SYNTHROID) 150 MCG tablet [Pharmacy Med Name: Levothyroxine Sodium 150MCG TABS] 30 tablet 0    Sig: TAKE 1 TABLET BY MOUTH DAILY      Endocrinology:  Hypothyroid Agents Failed - 09/21/2020 10:46 AM      Failed - TSH needs to be rechecked within 3 months after an abnormal result. Refill until TSH is due.      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   [place]   1 year ago Hypothyroidism, unspecified type   [place] Kerin Perna, NP   1 year ago Pain in joint of right shoulder   [place], Michelle P, NP                  atorvastatin (LIPITOR) 40 MG tablet [Pharmacy Med Name: Atorvastatin Calcium 40MG  TABS] 30 tablet 0    Sig: TAKE 1 TABLET BY MOUTH DAILY      Cardiovascular:  Antilipid - Statins Failed - 09/21/2020 10:46 AM      Failed - Total Cholesterol in normal range and within 360 days    Cholesterol, Total  Date Value Ref Range Status  06/30/2019 242 (H) 100 - 199 mg/dL Final          Failed - LDL in normal range and within 360 days    LDL Chol Calc (NIH)  Date Value Ref Range Status  06/30/2019 145 (H) 0 - 99 mg/dL Final          Failed - HDL in normal range and within 360 days    HDL  Date Value Ref Range Status  06/30/2019 46 >39 mg/dL Final          Failed - Triglycerides in normal range and  within 360 days    Triglycerides  Date Value Ref Range Status  06/30/2019 279 (H) 0 - 149 mg/dL Final          Passed - Patient is not pregnant      Passed - Valid encounter within last 12 months    Recent Outpatient Visits           4 months ago Screening for diabetes mellitus   [place], Michelle P, NP   1 year ago Blood in stool   [place], Michelle P, NP   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

## 2020-09-30 ENCOUNTER — Other Ambulatory Visit: Payer: Self-pay

## 2020-10-06 ENCOUNTER — Other Ambulatory Visit (INDEPENDENT_AMBULATORY_CARE_PROVIDER_SITE_OTHER): Payer: Self-pay | Admitting: Primary Care

## 2020-10-06 ENCOUNTER — Other Ambulatory Visit: Payer: Self-pay

## 2020-10-06 DIAGNOSIS — E7841 Elevated Lipoprotein(a): Secondary | ICD-10-CM

## 2020-10-06 DIAGNOSIS — E039 Hypothyroidism, unspecified: Secondary | ICD-10-CM

## 2020-10-06 NOTE — Telephone Encounter (Signed)
Notes to clinic:  Patient has appointment on 10/19/2020 Review for refill    Requested Prescriptions  Pending Prescriptions Disp Refills   levothyroxine (SYNTHROID) 150 MCG tablet 30 tablet 0    Sig: TAKE 1 TABLET (150 MCG TOTAL) BY MOUTH DAILY.      Endocrinology:  Hypothyroid Agents Failed - 10/06/2020  9:44 AM      Failed - TSH needs to be rechecked within 3 months after an abnormal result. Refill until TSH is due.      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      Cardiovascular:  Antilipid - Statins Failed - 10/06/2020  9:44 AM      Failed - Total Cholesterol in normal range and within 360 days    Cholesterol, Total  Date Value Ref Range Status  06/30/2019 242 (H) 100 - 199 mg/dL Final          Failed - LDL in normal range and within 360 days    LDL Chol Calc (NIH)  Date Value Ref Range Status  06/30/2019 145 (H) 0 - 99 mg/dL Final          Failed - HDL in normal range and within 360 days    HDL  Date Value Ref Range Status  06/30/2019 46 >39 mg/dL Final          Failed  - Triglycerides in normal range and within 360 days    Triglycerides  Date Value Ref Range Status  06/30/2019 279 (H) 0 - 149 mg/dL Final          Passed - Patient is not pregnant      Passed - Valid encounter within last 12 months    Recent Outpatient Visits           5 months ago Screening for diabetes mellitus   [place], Michelle P, NP   1 year ago Blood in stool   [place], Michelle P, NP   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

## 2020-10-07 ENCOUNTER — Other Ambulatory Visit: Payer: Self-pay

## 2020-10-11 ENCOUNTER — Other Ambulatory Visit: Payer: Self-pay

## 2020-10-11 MED ORDER — LEVOTHYROXINE SODIUM 150 MCG PO TABS
150.0000 ug | ORAL_TABLET | Freq: Every day | ORAL | 0 refills | Status: DC
  Filled 2020-10-11 – 2020-10-13 (×2): qty 10, 10d supply, fill #0

## 2020-10-11 MED ORDER — ATORVASTATIN CALCIUM 40 MG PO TABS
40.0000 mg | ORAL_TABLET | Freq: Every day | ORAL | 0 refills | Status: DC
  Filled 2020-10-11: qty 30, 30d supply, fill #0

## 2020-10-12 ENCOUNTER — Other Ambulatory Visit: Payer: Self-pay

## 2020-10-13 ENCOUNTER — Other Ambulatory Visit: Payer: Self-pay

## 2020-10-14 ENCOUNTER — Other Ambulatory Visit: Payer: Self-pay

## 2020-10-14 ENCOUNTER — Telehealth (HOSPITAL_COMMUNITY): Payer: Self-pay | Admitting: Licensed Clinical Social Worker

## 2020-10-18 ENCOUNTER — Ambulatory Visit (HOSPITAL_COMMUNITY): Payer: No Payment, Other | Admitting: Licensed Clinical Social Worker

## 2020-10-18 ENCOUNTER — Other Ambulatory Visit: Payer: Self-pay

## 2020-10-19 ENCOUNTER — Ambulatory Visit: Payer: Self-pay | Attending: Family Medicine | Admitting: Family Medicine

## 2020-10-19 ENCOUNTER — Encounter: Payer: Self-pay | Admitting: Family Medicine

## 2020-10-19 ENCOUNTER — Other Ambulatory Visit: Payer: Self-pay

## 2020-10-19 VITALS — BP 119/79 | HR 64 | Ht 64.0 in | Wt 207.6 lb

## 2020-10-19 DIAGNOSIS — E7800 Pure hypercholesterolemia, unspecified: Secondary | ICD-10-CM

## 2020-10-19 DIAGNOSIS — L659 Nonscarring hair loss, unspecified: Secondary | ICD-10-CM

## 2020-10-19 DIAGNOSIS — M25562 Pain in left knee: Secondary | ICD-10-CM

## 2020-10-19 DIAGNOSIS — G8929 Other chronic pain: Secondary | ICD-10-CM

## 2020-10-19 DIAGNOSIS — E039 Hypothyroidism, unspecified: Secondary | ICD-10-CM

## 2020-10-19 DIAGNOSIS — D229 Melanocytic nevi, unspecified: Secondary | ICD-10-CM

## 2020-10-19 MED ORDER — ATORVASTATIN CALCIUM 40 MG PO TABS
40.0000 mg | ORAL_TABLET | Freq: Every day | ORAL | 3 refills | Status: DC
  Filled 2020-10-19: qty 30, 30d supply, fill #0
  Filled 2020-12-15: qty 30, 30d supply, fill #1
  Filled 2021-01-11 (×2): qty 30, 30d supply, fill #2
  Filled 2021-02-14: qty 30, 30d supply, fill #3

## 2020-10-19 MED ORDER — SELENIUM SULFIDE 2.25 % EX SHAM
MEDICATED_SHAMPOO | CUTANEOUS | 1 refills | Status: DC
Start: 2020-10-19 — End: 2021-05-26
  Filled 2020-10-19: qty 180, 30d supply, fill #0

## 2020-10-19 MED ORDER — PREDNISONE 20 MG PO TABS
20.0000 mg | ORAL_TABLET | Freq: Every day | ORAL | 0 refills | Status: DC
Start: 2020-10-19 — End: 2020-10-28
  Filled 2020-10-19: qty 5, 5d supply, fill #0

## 2020-10-19 NOTE — Progress Notes (Signed)
Having pain in left knee. Has redness around both ankles. Hair loss.

## 2020-10-19 NOTE — Progress Notes (Signed)
Subjective:  Patient ID: Kristen Moore, female    DOB: [birth year]  Age: 50 y.o. MRN: [id number]  CC: Knee Pain (/)   HPI Kristen Moore is a 50 year old female with a history of bipolar depression, hypothyroidism, tobacco abuse, asthma who presents today to establish care.  She is switching her care here because 'it did not seem like they cared for her at [place]'.  Interval History: She complains of chronic left knee pain.  Worse with going down stairs, rising from a bending position for the last year. Pan is worse at night. Sometimes she has swelling. Uses Aspercreme and Tylenol as she is allergic to NSAIDS and has swelling when she uses it.  Pain is absent at rest. Lower third of legs have been erythematous for years. She smokes; denies history of intermittent claudication. Has also had hair loss on the top of her head for a long time.  She dyes her hair but does not use chemicals. Scalp sometimes itches. Complains of mole in her right calf which has been present for several years and she is not concerned about it as it is changing in appearance. Past Medical History:  Diagnosis Date  . Abnormal Pap smear    cryo  . Anemia   . Asthma   . Bipolar 1 disorder ([place])   . Cyst of breast    left breast  . DUB (dysfunctional uterine bleeding)   . Fibroids   . Headache(784.0)   . Hypothyroidism   . IBS (irritable bowel syndrome)   . Thyroid dysfunction     Past Surgical History:  Procedure Laterality Date  . ABDOMINAL HYSTERECTOMY Bilateral 12/25/2012   Procedure: HYSTERECTOMY ABDOMINAL;  Surgeon: Emily Filbert, MD;  Location: [facility];  Service: Gynecology;  Laterality: Bilateral;  TAH and bilateral salpingectomy  . BILATERAL SALPINGECTOMY Bilateral 12/25/2012   Procedure: BILATERAL SALPINGECTOMY;  Surgeon: Emily Filbert, MD;  Location: [facility];  Service: Gynecology;  Laterality: Bilateral;  . CERVICAL BIOPSY  W/ LOOP ELECTRODE EXCISION    . CHOLECYSTECTOMY     . COLON SURGERY    . DILATION AND CURETTAGE OF UTERUS    . OOPHORECTOMY Bilateral 12/25/2012   Procedure: OOPHORECTOMY;  Surgeon: Emily Filbert, MD;  Location: [facility];  Service: Gynecology;  Laterality: Bilateral;  . TUBAL LIGATION      Family History  Problem Relation Age of Onset  . Heart disease Father   . Cancer Father 39       lung  . Hypertension Mother   . Rheum arthritis Mother   . Fibromyalgia Mother   . COPD Mother   . Sleep apnea Mother   . Thyroid disease Mother   . Breast cancer Maternal Grandmother   . Breast cancer Paternal Aunt       Outpatient Medications Prior to Visit  Medication Sig Dispense Refill  . atorvastatin (LIPITOR) 40 MG tablet TAKE 1 TABLET (40 MG TOTAL) BY MOUTH DAILY. 30 tablet 0  . BLACK COHOSH EXTRACT PO Take 2 tablets by mouth daily.    Marland Kitchen buPROPion (WELLBUTRIN XL) 300 MG 24 hr tablet Take 1 tablet (300 mg total) by mouth daily. 30 tablet 2  . busPIRone (BUSPAR) 10 MG tablet Take 1 tablet (10 mg total) by mouth 3 (three) times daily. 90 tablet 2  . hydrOXYzine (ATARAX/VISTARIL) 10 MG tablet Take 0.5 tablets (5 mg total) by mouth 3 (three) times daily as needed. 45 tablet 2  . levothyroxine (SYNTHROID) 150  MCG tablet TAKE 1 TABLET (150 MCG TOTAL) BY MOUTH DAILY. 10 tablet 0  . lithium carbonate 300 MG capsule TAKE 1 CAPSULE BY MOUTH EVERY MORNING & TAKE 2 CAPSULES EVERY NIGHT AT BEDTIME 90 capsule 2  . Multiple Vitamin (MULTIVITAMIN) capsule Take 2 capsules by mouth daily.     . Multiple Vitamins-Minerals (ZINC PO) Take 2 tablets by mouth daily.    Marland Kitchen OVER THE COUNTER MEDICATION Take 2 tablets by mouth daily. Sea moss vitamin     No facility-administered medications prior to visit.     ROS Review of Systems  Constitutional: Negative for activity change, appetite change and fatigue.  HENT: Negative for congestion, sinus pressure and sore throat.   Eyes: Negative for visual disturbance.  Respiratory: Negative for cough, chest tightness,  shortness of breath and wheezing.   Cardiovascular: Negative for chest pain and palpitations.  Gastrointestinal: Negative for abdominal distention, abdominal pain and constipation.  Endocrine: Negative for polydipsia.  Genitourinary: Negative for dysuria and frequency.  Musculoskeletal:       See HPI  Skin: Positive for rash.  Neurological: Negative for tremors, light-headedness and numbness.  Hematological: Does not bruise/bleed easily.  Psychiatric/Behavioral: Negative for agitation and behavioral problems.    Objective:  BP 119/79   Pulse 64   Ht 5\' 4"  (1.626 m)   Wt 207 lb 9.6 oz (94.2 kg)   LMP 11/29/2012   SpO2 97%   BMI 35.63 kg/m   BP/Weight 10/19/2020 06/14/2020 06/12/3233  Systolic BP 573 220 254  Diastolic BP 79 82 82  Wt. (Lbs) 207.6 209.4 209  BMI 35.63 35.94 35.87      Physical Exam Constitutional:      Appearance: She is well-developed.  HENT:     Head:     Comments: Thinning of hair in crown Neck:     Vascular: No JVD.  Cardiovascular:     Rate and Rhythm: Normal rate.     Heart sounds: Normal heart sounds. No murmur heard.   Pulmonary:     Effort: Pulmonary effort is normal.     Breath sounds: Normal breath sounds. No wheezing or rales.  Chest:     Chest wall: No tenderness.  Abdominal:     General: Bowel sounds are normal. There is no distension.     Palpations: Abdomen is soft. There is no mass.     Tenderness: There is no abdominal tenderness.  Musculoskeletal:        General: Normal range of motion.     Right lower leg: No edema.     Left lower leg: No edema.     Comments: Slight tenderness of the knee joint on flexion and extension.  Skin:    Comments: Slight erythema on lower third of legs Nodule on posterior left calf  Neurological:     Mental Status: She is alert and oriented to person, place, and time.  Psychiatric:        Mood and Affect: Mood normal.     CMP Latest Ref Rng & Units 03/18/2020 06/30/2019 05/29/2018  Glucose  70 - 99 mg/dL 102(H) 108(H) 87  BUN 6 - 20 mg/dL 12 14 11   Creatinine 0.44 - 1.00 mg/dL 0.84 0.69 0.87  Sodium 135 - 145 mmol/L 139 141 141  Potassium 3.5 - 5.1 mmol/L 4.0 3.9 4.7  Chloride 98 - 111 mmol/L 105 103 106  CO2 22 - 32 mmol/L 26 24 23   Calcium 8.9 - 10.3 mg/dL 9.9 9.7 10.3(H)  Total  Protein 6.0 - 8.5 g/dL - 6.7 7.0  Total Bilirubin 0.0 - 1.2 mg/dL - 0.2 0.3  Alkaline Phos 39 - 117 IU/L - 75 79  AST 0 - 40 IU/L - 20 17  ALT 0 - 32 IU/L - 20 24    Lipid Panel     Component Value Date/Time   CHOL 242 (H) 06/30/2019 1528   TRIG 279 (H) 06/30/2019 1528   HDL 46 06/30/2019 1528   CHOLHDL 5.3 (H) 06/30/2019 1528   CHOLHDL 5.1 04/18/2018 1056   VLDL 56 (H) 04/18/2018 1056   LDLCALC 145 (H) 06/30/2019 1528    CBC    Component Value Date/Time   WBC 8.3 03/18/2020 0952   RBC 4.40 03/18/2020 0952   HGB 14.3 03/18/2020 0952   HGB 14.8 06/30/2019 1528   HCT 42.9 03/18/2020 0952   HCT 41.9 06/30/2019 1528   PLT 168 03/18/2020 0952   PLT 193 06/30/2019 1528   MCV 97.5 03/18/2020 0952   MCV 94 06/30/2019 1528   MCH 32.5 03/18/2020 0952   MCHC 33.3 03/18/2020 0952   RDW 12.3 03/18/2020 0952   RDW 12.9 06/30/2019 1528   LYMPHSABS 1.4 03/18/2020 0952   LYMPHSABS 1.5 06/30/2019 1528   MONOABS 0.5 03/18/2020 0952   EOSABS 0.1 03/18/2020 0952   EOSABS 0.1 06/30/2019 1528   BASOSABS 0.0 03/18/2020 0952   BASOSABS 0.0 06/30/2019 1528    Lab Results  Component Value Date   HGBA1C 5.1 04/25/2020    Assessment & Plan:  1. Hair loss Will send of thyroid labs - Selenium Sulfide 2.25 % SHAM; Use twice weekly  Dispense: 180 mL; Refill: 1  2. Chronic pain of left knee Suspicious for osteoarthritis Per patient she has received cortisone injection in her right knee Unable to use NSAIDs due to allergy Short course of prednisone Advised to obtain knee brace Weight loss will be beneficial - predniSONE (DELTASONE) 20 MG tablet; Take 1 tablet (20 mg total) by mouth daily  with breakfast.  Dispense: 5 tablet; Refill: 0  3. Hyperlipidemia Uncontrolled She will need a lipid panel with reflex - atorvastatin (LIPITOR) 40 MG tablet; TAKE 1 TABLET (40 MG TOTAL) BY MOUTH DAILY.  Dispense: 30 tablet; Refill: 3  4. Acquired hypothyroidism Last thyroid panel was normal - TSH - T4, free  5. Change in skin mole - Ambulatory referral to Dermatology    No orders of the defined types were placed in this encounter.   Follow-up: Return in about 3 months (around 01/19/2021) for PCP-chronic disease management.       Charlott Rakes, MD, FAAFP. [facility] [place], [place]   10/19/2020, 4:34 PM

## 2020-10-20 ENCOUNTER — Other Ambulatory Visit: Payer: Self-pay | Admitting: Family Medicine

## 2020-10-20 ENCOUNTER — Other Ambulatory Visit: Payer: Self-pay

## 2020-10-20 DIAGNOSIS — E039 Hypothyroidism, unspecified: Secondary | ICD-10-CM

## 2020-10-20 LAB — T4, FREE: Free T4: 1.05 ng/dL (ref 0.82–1.77)

## 2020-10-20 LAB — TSH: TSH: 25.8 u[IU]/mL — ABNORMAL HIGH (ref 0.450–4.500)

## 2020-10-20 MED ORDER — LEVOTHYROXINE SODIUM 175 MCG PO TABS
175.0000 ug | ORAL_TABLET | Freq: Every day | ORAL | 3 refills | Status: DC
  Filled 2020-10-20: qty 30, 30d supply, fill #0
  Filled 2020-11-16: qty 30, 30d supply, fill #1
  Filled 2020-12-15: qty 30, 30d supply, fill #2
  Filled 2021-01-11: qty 30, 30d supply, fill #3

## 2020-10-21 ENCOUNTER — Other Ambulatory Visit: Payer: Self-pay

## 2020-10-24 ENCOUNTER — Other Ambulatory Visit: Payer: Self-pay

## 2020-10-28 ENCOUNTER — Other Ambulatory Visit: Payer: Self-pay

## 2020-10-28 ENCOUNTER — Other Ambulatory Visit: Payer: Self-pay | Admitting: Family Medicine

## 2020-10-28 DIAGNOSIS — G8929 Other chronic pain: Secondary | ICD-10-CM

## 2020-10-28 DIAGNOSIS — M25562 Pain in left knee: Secondary | ICD-10-CM

## 2020-10-28 NOTE — Telephone Encounter (Signed)
Requested medication (s) are due for refill today: last dispensed 10/20/20  Requested medication (s) are on the active medication list: yes  Last refill:  10/19/20 #5 0 refills  Future visit scheduled: yes in 2 months   Notes to clinic:  not delegated per protocol     Requested Prescriptions  Pending Prescriptions Disp Refills   predniSONE (DELTASONE) 20 MG tablet 5 tablet 0    Sig: Take 1 tablet (20 mg total) by mouth daily with breakfast.      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

## 2020-11-02 MED ORDER — PREDNISONE 20 MG PO TABS
20.0000 mg | ORAL_TABLET | Freq: Every day | ORAL | 0 refills | Status: DC
  Filled 2020-11-02: qty 5, 5d supply, fill #0

## 2020-11-03 ENCOUNTER — Other Ambulatory Visit: Payer: Self-pay

## 2020-11-10 ENCOUNTER — Other Ambulatory Visit: Payer: Self-pay

## 2020-11-10 ENCOUNTER — Ambulatory Visit (INDEPENDENT_AMBULATORY_CARE_PROVIDER_SITE_OTHER): Payer: No Payment, Other | Admitting: Licensed Clinical Social Worker

## 2020-11-10 DIAGNOSIS — F418 Other specified anxiety disorders: Secondary | ICD-10-CM

## 2020-11-15 NOTE — Progress Notes (Signed)
   THERAPIST PROGRESS NOTE   Virtual Visit via Video Note  I connected with Kristen Moore on 11/10/20 at  2:00 PM EDT by a video enabled telemedicine application and verified that I am speaking with the correct person using two identifiers.  Location: Patient: Home Provider: [facility]   I discussed the limitations of evaluation and management by telemedicine and the availability of in person appointments. The patient expressed understanding and agreed to proceed. I discussed the assessment and treatment plan with the patient. The patient was provided an opportunity to ask questions and all were answered. The patient agreed with the plan and demonstrated an understanding of the instructions.  I provided 43 minutes of non-face-to-face time during this encounter.  Participation Level: Active  Behavioral Response: CasualAlertAnxious and Depressed  Type of Therapy: Individual Therapy  Treatment Goals addressed: Communication: Anx/dep/coping  Interventions: Solution Focused and Supportive  Summary: Kristen Moore is a 50 y.o. female who presents with hx of anx/dep. This date pt signs on from her space in the attic of her mother's home. Pt presents as anx and dep as evidenced by voice tone, speech, thought processes. Pt reports her mother's cancer has metastasized extensively and she has a limited time to live. Pt states "I have cried like a baby". She advises mother will not be getting additional tx. LCSW assessed for knowledge of hospice. Pt reports her mother has rejected hospice. This was explored in detail with education provided on barriers. Pt states intent to talk with mother about hospice r/t new info provided. Pt very worried about her living situation after mother's death. Pt is still working but with limited hours. 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 Pt trying to save r/t the coming unknowns. She continues to struggle with dtr's  addiction and choices. LCSW assisted to process all thoughts/feelings of distress. Provided education on anticipatory grief. Addressed coping and coping strategies. Reviewed poc including scheduling and walk in option. Pt states appreciation for care.   Suicidal/Homicidal: Nowithout intent/plan  Therapist Response: Pt remains receptive to care.  Plan: Return again in 4 weeks.  Diagnosis: Axis I:  Anxiety with depression  [name], LCSW 11/15/2020

## 2020-11-16 ENCOUNTER — Other Ambulatory Visit: Payer: Self-pay

## 2020-11-18 ENCOUNTER — Other Ambulatory Visit: Payer: Self-pay

## 2020-12-08 ENCOUNTER — Telehealth (INDEPENDENT_AMBULATORY_CARE_PROVIDER_SITE_OTHER): Payer: No Payment, Other | Admitting: Psychiatry

## 2020-12-08 ENCOUNTER — Other Ambulatory Visit: Payer: Self-pay

## 2020-12-08 ENCOUNTER — Encounter (HOSPITAL_COMMUNITY): Payer: Self-pay | Admitting: Psychiatry

## 2020-12-08 DIAGNOSIS — F411 Generalized anxiety disorder: Secondary | ICD-10-CM | POA: Diagnosis not present

## 2020-12-08 DIAGNOSIS — F319 Bipolar disorder, unspecified: Secondary | ICD-10-CM | POA: Diagnosis not present

## 2020-12-08 MED ORDER — BUSPIRONE HCL 10 MG PO TABS
10.0000 mg | ORAL_TABLET | Freq: Three times a day (TID) | ORAL | 2 refills | Status: DC
  Filled 2020-12-08: qty 90, 30d supply, fill #0

## 2020-12-08 MED ORDER — HYDROXYZINE HCL 10 MG PO TABS
5.0000 mg | ORAL_TABLET | Freq: Three times a day (TID) | ORAL | 2 refills | Status: DC | PRN
  Filled 2020-12-08: qty 45, 30d supply, fill #0

## 2020-12-08 MED ORDER — LITHIUM CARBONATE 300 MG PO CAPS
ORAL_CAPSULE | ORAL | 2 refills | Status: DC
  Filled 2020-12-08 – 2020-12-15 (×2): qty 90, 30d supply, fill #0
  Filled 2021-01-11: qty 90, 30d supply, fill #1
  Filled 2021-02-14: qty 90, 30d supply, fill #2

## 2020-12-08 MED ORDER — BUPROPION HCL ER (XL) 300 MG PO TB24
300.0000 mg | ORAL_TABLET | Freq: Every day | ORAL | 2 refills | Status: DC
  Filled 2020-12-08 – 2020-12-15 (×2): qty 30, 30d supply, fill #0
  Filled 2021-01-11: qty 30, 30d supply, fill #1
  Filled 2021-02-14: qty 30, 30d supply, fill #2

## 2020-12-08 NOTE — Progress Notes (Signed)
[place] MD/PA/NP OP Progress Note Virtual Visit via Video Note  I connected with Kristen Moore on 12/08/20 at  9:30 AM EDT by a video enabled telemedicine application and verified that I am speaking with the correct person using two identifiers.  Location: Patient: Home Provider: Clinic   I discussed the limitations of evaluation and management by telemedicine and the availability of in person appointments. The patient expressed understanding and agreed to proceed.  I provided 30 minutes of non-face-to-face time during this encounter.      12/08/2020 10:01 AM Kristen Moore  MRN:  [id number]  Chief Complaint: "There is a lot of going on"   HPI: 50 year old female seen today for follow up psychiatric evaluation. She has a psychiatric history of anxiety, depression, and bipolar disorder. She is currently being managed on Wellbutrin XL 300 mg daily, hydroxyzine 5 mg three times daily, Buspar 10 mg three times, and lithium 300 mg in the morning and 600 mg nightly. She noted that her medications are somewhat effective in managing her psychiatric conditions.    Today she was pleasant, cooperative, and engaged in [place].  Patient log on virtually however her volume was not working so the rest of her assessment was done over the phone. Today she informed Probation officer that she has days of depression but notes that its due to her life stressors. She notes her mother has a few months to live. She notes that she fears that she will not have support when her mother dies because she and her father do no get along.  She notes that she is looking for housing currently but has not been able to find.  Today.  Patient also informed Probation officer that her daughter is a source of her stress.  She notes that she is addicted to illegal substances and has not been supportive of the family in the time of need.  She also notes that she misses her granddaughter.  Today provider conducted a PHQ-9 and patient scored a 10,  at her last visit she scored an 11.  Provider also conducted a GAD-7 and patient scored a 14, at her last visit she scored a 21.  She endorses adequate appetite.  She notes that most nights her sleep is poor.  She informed Probation officer that she does not have AC in her home and has difficulty sleeping because she is hot.  She informed Probation officer that she sleeps approximately 5 hours nightly.  Today she denies SI/HI/VAH, mania, or paranoia.    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  She notes she now has 2 part-time jobs and enjoy working in both positions.  No medication changes made today.  Patient agreeable to continue medication as prescribed.  She will follow-up with outpatient counseling for therapy.  No other concerns noted at this time.   .    Visit Diagnosis:    ICD-10-CM   1. Bipolar depression (HCC)  F31.9 buPROPion (WELLBUTRIN XL) 300 MG 24 hr tablet    busPIRone (BUSPAR) 10 MG tablet    lithium carbonate 300 MG capsule    2. Generalized anxiety disorder  F41.1 busPIRone (BUSPAR) 10 MG tablet    hydrOXYzine (ATARAX/VISTARIL) 10 MG tablet      Past Psychiatric History:  anxiety, depression, and bipolar disorder.  Past Medical History:  Past Medical History:  Diagnosis Date   Abnormal Pap smear    cryo   Anemia    Asthma    Bipolar 1  disorder ([place])    Cyst of breast    left breast   DUB (dysfunctional uterine bleeding)    Fibroids    Headache(784.0)    Hypothyroidism    IBS (irritable bowel syndrome)    Thyroid dysfunction     Past Surgical History:  Procedure Laterality Date   ABDOMINAL HYSTERECTOMY Bilateral 12/25/2012   Procedure: HYSTERECTOMY ABDOMINAL;  Surgeon: Emily Filbert, MD;  Location: [facility];  Service: Gynecology;  Laterality: Bilateral;  TAH and bilateral salpingectomy   BILATERAL SALPINGECTOMY Bilateral 12/25/2012   Procedure: BILATERAL SALPINGECTOMY;  Surgeon: Emily Filbert, MD;  Location: [facility];  Service: Gynecology;  Laterality:  Bilateral;   CERVICAL BIOPSY  W/ LOOP ELECTRODE EXCISION     CHOLECYSTECTOMY     COLON SURGERY     DILATION AND CURETTAGE OF UTERUS     OOPHORECTOMY Bilateral 12/25/2012   Procedure: OOPHORECTOMY;  Surgeon: Emily Filbert, MD;  Location: [facility];  Service: Gynecology;  Laterality: Bilateral;   TUBAL LIGATION      Family Psychiatric History: Daughter substance abuse  Family History:  Family History  Problem Relation Age of Onset   Heart disease Father    Cancer Father 56       lung   Hypertension Mother    Rheum arthritis Mother    Fibromyalgia Mother    COPD Mother    Sleep apnea Mother    Thyroid disease Mother    Breast cancer Maternal Grandmother    Breast cancer Paternal Aunt     Social History:  Social History   Socioeconomic History   Marital status: Single    Spouse name: Not on file   Number of children: 1   Years of education: Not on file   Highest education level: Associate degree: occupational, Hotel manager, or vocational program  Occupational History   Not on file  Tobacco Use   Smoking status: Every Day    Packs/day: 0.50    Years: 9.00    Pack years: 4.50    Types: Cigarettes   Smokeless tobacco: Never  Vaping Use   Vaping Use: Never used  Substance and Sexual Activity   Alcohol use: No    Alcohol/week: 0.0 standard drinks   Drug use: No   Sexual activity: Not Currently    Birth control/protection: Surgical  Other Topics Concern   Not on file  Social History Narrative   Mother has ovarian cancer that has spread to lungs, Dad has lung cancer that has spread to kidneys, and disabled brother all live in home with patient. Patient is primary caregiver. Daughter is drug addict, and has loss custody of patient's infant granddaughter in 02/2020. Patient works as a Scientist, water quality. Patient is very stressed and saddened about her current situation.    Social Determinants of Health   Financial Resource Strain: Not on file  Food Insecurity: Not on file   Transportation Needs: No Transportation Needs   Lack of Transportation (Medical): No   Lack of Transportation (Non-Medical): No  Physical Activity: Not on file  Stress: Not on file  Social Connections: Not on file    Allergies:   Metabolic Disorder Labs: Lab Results  Component Value Date   HGBA1C 5.1 04/25/2020   MPG 88.19 04/18/2018   No results found for: PROLACTIN Lab Results  Component Value Date   CHOL 242 (H) 06/30/2019   TRIG 279 (H) 06/30/2019   HDL 46 06/30/2019   CHOLHDL 5.3 (H) 06/30/2019   VLDL 56 (H) 04/18/2018  [place] 145 (H) 06/30/2019   LDLCALC 124 (H) 04/18/2018   Lab Results  Component Value Date   TSH 25.800 (H) 10/19/2020   TSH 0.485 06/30/2019    Therapeutic Level Labs: Lab Results  Component Value Date   LITHIUM 0.3 (L) 04/30/2018   LITHIUM 0.58 (L) 07/17/2010   No results found for: VALPROATE No components found for:  CBMZ  Current Medications: Current Outpatient Medications  Medication Sig Dispense Refill   atorvastatin (LIPITOR) 40 MG tablet TAKE 1 TABLET (40 MG TOTAL) BY MOUTH DAILY. 30 tablet 3   BLACK COHOSH EXTRACT PO Take 2 tablets by mouth daily.     buPROPion (WELLBUTRIN XL) 300 MG 24 hr tablet Take 1 tablet (300 mg total) by mouth daily. 30 tablet 2   busPIRone (BUSPAR) 10 MG tablet Take 1 tablet (10 mg total) by mouth 3 (three) times daily. 90 tablet 2   hydrOXYzine (ATARAX/VISTARIL) 10 MG tablet Take 0.5 tablets (5 mg total) by mouth 3 (three) times daily as needed. 45 tablet 2   levothyroxine (SYNTHROID) 175 MCG tablet Take 1 tablet (175 mcg total) by mouth daily. 30 tablet 3   lithium carbonate 300 MG capsule TAKE 1 CAPSULE BY MOUTH EVERY MORNING & TAKE 2 CAPSULES EVERY NIGHT AT BEDTIME 90 capsule 2   Multiple Vitamin (MULTIVITAMIN) capsule Take 2 capsules by mouth daily.      Multiple Vitamins-Minerals (ZINC PO) Take 2 tablets by mouth daily.     OVER THE COUNTER MEDICATION Take 2 tablets by mouth daily. Sea moss vitamin      predniSONE (DELTASONE) 20 MG tablet Take 1 tablet (20 mg total) by mouth daily with breakfast. 5 tablet 0   Selenium Sulfide 2.25 % SHAM Use twice weekly 180 mL 1   No current facility-administered medications for this visit.     Musculoskeletal: Strength & Muscle Tone:  Unable to assess due to telehealth visit [place]:  Unable to assess due to telehealth visit Patient leans: N/A  Psychiatric Specialty Exam: Review of Systems  Last menstrual period 11/29/2012.There is no height or weight on file to calculate BMI.  General Appearance: Well Groomed  Eye Contact:  Good  Speech:  Clear and Coherent and Normal Rate  Volume:  Normal  Mood:  Anxious and Depressed  Affect:  Appropriate and Congruent  Thought Process:  Coherent, Goal Directed and Linear  Orientation:  Full (Time, Place, and Person)  Thought Content: WDL and Logical   Suicidal Thoughts:  No  Homicidal Thoughts:  No  Memory:  Immediate;   Good Recent;   Good Remote;   Good  Judgement:  Good  Insight:  Good  Psychomotor Activity:  Normal  Concentration:  Concentration: Good and Attention Span: Good  Recall:  Good  Fund of Knowledge: Good  Language: Good  Akathisia:  No  Handed:  Right  AIMS (if indicated): Not done  Assets:  Communication Skills Desire for Improvement Financial Resources/Insurance Housing Social Support  ADL's:  Intact  Cognition: WNL  Sleep:  Fair   Screenings: GAD-7    Flowsheet Row Video Visit from 12/08/2020 in [facility] Office Visit from 10/19/2020 in [facility] Video Visit from 09/09/2020 in [facility] Office Visit from 04/25/2020 in [facility] Counselor from 12/17/2019 in [facility]  Total GAD-7 Score 14 12 21 6 19       PHQ2-9    Flowsheet Row  Video Visit from 12/08/2020 in [facility] Office Visit from  10/19/2020 in [facility] Video Visit from 09/09/2020 in [facility] Office Visit from 04/25/2020 in [facility] Counselor from 12/17/2019 in [facility]  PHQ-2 Total Score 5 3 3 2 6   PHQ-9 Total Score 10 11 11 6 17       Flowsheet Row Video Visit from 09/09/2020 in [facility] Office Visit from 05/07/2019 in [place] No Risk Error: Q3, 4, or 5 should not be populated when Q2 is No        Assessment and Plan: Patient endorses increased anxiety and depression due to life stressors.  She however notes that she can cope with it.  No medication changes made today.  Patient agreeable to continue medications as prescribed.    1. Bipolar depression ([place])  Continue- buPROPion (WELLBUTRIN XL) 300 MG 24 hr tablet; Take 1 tablet (300 mg total) by mouth daily.  Dispense: 30 tablet; Refill: 2 Continue- lithium carbonate 300 MG capsule; Take 1-2 capsules (300-600 mg total) by mouth 2 (two) times daily with a meal. Takes 300 mg in the morning and 600 mg at bedtime  Dispense: 90 capsule; Refill: 2 Continue- busPIRone (BUSPAR) 10 MG tablet; Take 1 tablet (10 mg total) by mouth 3 (three) times daily.  Dispense: 90 tablet; Refill: 2  2. Generalized anxiety disorder  Continue- hydrOXYzine (ATARAX/VISTARIL) 10 MG tablet; Take 0.5 tablets (5 mg total) by mouth 3 (three) times daily as needed.  Dispense: 45 tablet; Refill: 2 Continue- busPIRone (BUSPAR) 10 MG tablet; Take 1 tablet (10 mg total) by mouth 3 (three) times daily.  Dispense: 90 tablet; Refill: 2  Follow up in 3 months Follow up with therapu   Salley Slaughter, NP 12/08/2020, 10:01 AM

## 2020-12-09 ENCOUNTER — Other Ambulatory Visit: Payer: Self-pay

## 2020-12-09 ENCOUNTER — Ambulatory Visit (INDEPENDENT_AMBULATORY_CARE_PROVIDER_SITE_OTHER): Payer: No Payment, Other | Admitting: Licensed Clinical Social Worker

## 2020-12-09 DIAGNOSIS — F418 Other specified anxiety disorders: Secondary | ICD-10-CM | POA: Diagnosis not present
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
## 2020-12-14 ENCOUNTER — Ambulatory Visit
Admission: RE | Admit: 2020-12-14 | Discharge: 2020-12-14 | Disposition: A | Discharge status: Home / Self Care | Payer: No Typology Code available for payment source | Source: Ambulatory Visit | Attending: Obstetrics and Gynecology | Admitting: Obstetrics and Gynecology

## 2020-12-14 ENCOUNTER — Other Ambulatory Visit: Payer: Self-pay | Admitting: Obstetrics and Gynecology

## 2020-12-14 ENCOUNTER — Other Ambulatory Visit: Payer: Self-pay

## 2020-12-14 DIAGNOSIS — N6310 Unspecified lump in the right breast, unspecified quadrant: Secondary | ICD-10-CM

## 2020-12-15 ENCOUNTER — Other Ambulatory Visit: Payer: Self-pay

## 2020-12-23 ENCOUNTER — Telehealth: Payer: Self-pay

## 2020-12-23 NOTE — Telephone Encounter (Signed)
Pt called in stating that she has jury duty, on 08/01, pt wanted to know if she could get a note to excuse her from that. Pt states she has a disability and no money to be able to do that. As well [place] and she is just not able to do that. CB: [id number] will be off 07/24) or [phone]. Pt stated that the court needed it to be emailed to  [email or web address]

## 2020-12-26 NOTE — Telephone Encounter (Signed)
Lack of money does not justify the need for excuse from jury duty and I do not have any proof of disability, she just had one visit with me and we have no medical justification for letter of excuse.

## 2020-12-27 ENCOUNTER — Ambulatory Visit: Payer: Self-pay

## 2020-12-27 NOTE — Telephone Encounter (Signed)
Pt was called and informed of PCP response. 

## 2020-12-27 NOTE — Telephone Encounter (Signed)
Pt is calling and has taken covid test yesterday and today both are negative. Pt is having runny nose, watery eyes, sore throat and cough no appt available. Please advise  Answer Assessment - Initial Assessment Questions 1. ONSET: "When did the cough begin?"      Yesterday 2. SEVERITY: "How bad is the cough today?"      Mild 3. SPUTUM: "Describe the color of your sputum" (none, dry cough; clear, white, yellow, green)     Green 4. HEMOPTYSIS: "Are you coughing up any blood?" If so ask: "How much?" (flecks, streaks, tablespoons, etc.)     No 5. DIFFICULTY BREATHING: "Are you having difficulty breathing?" If Yes, ask: "How bad is it?" (e.g., mild, moderate, severe)    - MILD: No SOB at rest, mild SOB with walking, speaks normally in sentences, can lie down, no retractions, pulse < 100.    - MODERATE: SOB at rest, SOB with minimal exertion and prefers to sit, cannot lie down flat, speaks in phrases, mild retractions, audible wheezing, pulse 100-120.    - SEVERE: Very SOB at rest, speaks in single words, struggling to breathe, sitting hunched forward, retractions, pulse > 120      No 6. FEVER: "Do you have a fever?" If Yes, ask: "What is your temperature, how was it measured, and when did it start?"     No 7. CARDIAC HISTORY: "Do you have any history of heart disease?" (e.g., heart attack, congestive heart failure)      No 8. LUNG HISTORY: "Do you have any history of lung disease?"  (e.g., pulmonary embolus, asthma, emphysema)     No 9. PE RISK FACTORS: "Do you have a history of blood clots?" (or: recent major surgery, recent prolonged travel, bedridden)     No 10. OTHER SYMPTOMS: "Do you have any other symptoms?" (e.g., runny nose, wheezing, chest pain)       Runny nose 11. PREGNANCY: "Is there any chance you are pregnant?" "When was your last menstrual period?"       No 12. TRAVEL: "Have you traveled out of the country in the last month?" (e.g., travel history, exposures)        No  Protocols used: Cough - Acute Productive-A-AH

## 2020-12-27 NOTE — Telephone Encounter (Signed)
Pt. Started coughing yesterday. Productive with green mucus. Has runny nose, sore throat, had diarrhea yesterday. Has done 3 COVID home test - negative. No availability in the practice. Given [facility]. Will call about appointment.

## 2020-12-28 ENCOUNTER — Telehealth: Payer: No Typology Code available for payment source | Admitting: Physician Assistant

## 2020-12-29 ENCOUNTER — Encounter: Payer: Self-pay | Admitting: Physician Assistant

## 2020-12-29 ENCOUNTER — Other Ambulatory Visit: Payer: Self-pay

## 2020-12-29 ENCOUNTER — Telehealth (INDEPENDENT_AMBULATORY_CARE_PROVIDER_SITE_OTHER): Payer: Self-pay | Admitting: Physician Assistant

## 2020-12-29 DIAGNOSIS — J069 Acute upper respiratory infection, unspecified: Secondary | ICD-10-CM

## 2020-12-29 MED ORDER — BENZONATATE 100 MG PO CAPS
100.0000 mg | ORAL_CAPSULE | Freq: Two times a day (BID) | ORAL | 0 refills | Status: DC | PRN
  Filled 2020-12-29: qty 20, 10d supply, fill #0

## 2020-12-29 MED ORDER — FLUTICASONE PROPIONATE 50 MCG/ACT NA SUSP
2.0000 | Freq: Every day | NASAL | 6 refills | Status: DC
  Filled 2020-12-29: qty 16, 30d supply, fill #0

## 2020-12-29 MED ORDER — CETIRIZINE HCL 10 MG PO TABS
10.0000 mg | ORAL_TABLET | Freq: Every day | ORAL | 11 refills | Status: DC
Start: 2020-12-29 — End: 2021-04-06
  Filled 2020-12-29: qty 30, 30d supply, fill #0

## 2020-12-29 NOTE — Progress Notes (Signed)
Established Patient Office Visit  Subjective:  Patient ID: KAALA REEFER, female    DOB: Oct 08, 1970  Age: 50 y.o. MRN: [id number]  CC:  Chief Complaint  Patient presents with   URI   Virtual Visit via Telephone Note  I connected with [name] on 12/29/20 at  9:50 AM EDT by telephone and verified that I am speaking with the correct person using two identifiers.  Location: Patient: Home Provider: Primary Care at [facility]   I discussed the limitations, risks, security and privacy concerns of performing an evaluation and management service by telephone and the availability of in person appointments. I also discussed with the patient that there may be a patient responsible charge related to this service. The patient expressed understanding and agreed to proceed.   History of Present Illness:   VERDIS GRANAT reports that she started having a sore throat productive cough with green sputum, clear nasal discharge and watery eyes 5 days ago.  Reports that she has had resolution of most symptoms with the exception of clear nasal discharge and continuing with productive cough.  Reports that she is eating and drinking okay.  Reports that she has been resting, using over-the-counter cold tablets as well as Benadryl with some relief.  Reports that she has taken 3 home COVID test all negative.  Reports family member with similar symptoms   Observations/Objective: Medical history and current medications reviewed, no physical exam completed       Past Medical History:  Diagnosis Date   Abnormal Pap smear    cryo   Anemia    Asthma    Bipolar 1 disorder ([place])    Cyst of breast    left breast   DUB (dysfunctional uterine bleeding)    Fibroids    Headache(784.0)    Hypothyroidism    IBS (irritable bowel syndrome)    Thyroid dysfunction     Past Surgical History:  Procedure Laterality Date   ABDOMINAL HYSTERECTOMY Bilateral 12/25/2012   Procedure: HYSTERECTOMY  ABDOMINAL;  Surgeon: Emily Filbert, MD;  Location: [facility];  Service: Gynecology;  Laterality: Bilateral;  TAH and bilateral salpingectomy   BILATERAL SALPINGECTOMY Bilateral 12/25/2012   Procedure: BILATERAL SALPINGECTOMY;  Surgeon: Emily Filbert, MD;  Location: [facility];  Service: Gynecology;  Laterality: Bilateral;   CERVICAL BIOPSY  W/ LOOP ELECTRODE EXCISION     CHOLECYSTECTOMY     COLON SURGERY     DILATION AND CURETTAGE OF UTERUS     OOPHORECTOMY Bilateral 12/25/2012   Procedure: OOPHORECTOMY;  Surgeon: Emily Filbert, MD;  Location: [facility];  Service: Gynecology;  Laterality: Bilateral;   TUBAL LIGATION      Family History  Problem Relation Age of Onset   Heart disease Father    Cancer Father 82       lung   Hypertension Mother    Rheum arthritis Mother    Fibromyalgia Mother    COPD Mother    Sleep apnea Mother    Thyroid disease Mother    Breast cancer Maternal Grandmother    Breast cancer Paternal Aunt     Social History   Socioeconomic History   Marital status: Single    Spouse name: Not on file   Number of children: 1   Years of education: Not on file   Highest education level: Associate degree: occupational, Hotel manager, or vocational program  Occupational History   Not on file  Tobacco Use   Smoking status: Every Day  Packs/day: 0.50    Years: 9.00    Pack years: 4.50    Types: Cigarettes   Smokeless tobacco: Never  Vaping Use   Vaping Use: Never used  Substance and Sexual Activity   Alcohol use: No    Alcohol/week: 0.0 standard drinks   Drug use: No   Sexual activity: Not Currently    Birth control/protection: Surgical  Other Topics Concern   Not on file  Social History Narrative   Mother has ovarian cancer that has spread to lungs, Dad has lung cancer that has spread to kidneys, and disabled brother all live in home with patient. Patient is primary caregiver. Daughter is drug addict, and has loss custody of patient's infant granddaughter in 02/2020. Patient  works as a Scientist, water quality. Patient is very stressed and saddened about her current situation.    Social Determinants of Health   Financial Resource Strain: Not on file  Food Insecurity: Not on file  Transportation Needs: No Transportation Needs   Lack of Transportation (Medical): No   Lack of Transportation (Non-Medical): No  Physical Activity: Not on file  Stress: Not on file  Social Connections: Not on file  Intimate Partner Violence: Not on file    Outpatient Medications Prior to Visit  Medication Sig Dispense Refill   atorvastatin (LIPITOR) 40 MG tablet TAKE 1 TABLET (40 MG TOTAL) BY MOUTH DAILY. 30 tablet 3   BLACK COHOSH EXTRACT PO Take 2 tablets by mouth daily.     buPROPion (WELLBUTRIN XL) 300 MG 24 hr tablet Take 1 tablet (300 mg total) by mouth daily. 30 tablet 2   busPIRone (BUSPAR) 10 MG tablet Take 1 tablet (10 mg total) by mouth 3 (three) times daily. 90 tablet 2   hydrOXYzine (ATARAX/VISTARIL) 10 MG tablet Take 0.5 tablets (5 mg total) by mouth 3 (three) times daily as needed. 45 tablet 2   levothyroxine (SYNTHROID) 175 MCG tablet Take 1 tablet (175 mcg total) by mouth daily. 30 tablet 3   lithium carbonate 300 MG capsule TAKE 1 CAPSULE BY MOUTH EVERY MORNING & TAKE 2 CAPSULES EVERY NIGHT AT BEDTIME 90 capsule 2   Multiple Vitamin (MULTIVITAMIN) capsule Take 2 capsules by mouth daily.      Multiple Vitamins-Minerals (ZINC PO) Take 2 tablets by mouth daily.     OVER THE COUNTER MEDICATION Take 2 tablets by mouth daily. Sea moss vitamin     Selenium Sulfide 2.25 % SHAM Use twice weekly 180 mL 1   predniSONE (DELTASONE) 20 MG tablet Take 1 tablet (20 mg total) by mouth daily with breakfast. 5 tablet 0   No facility-administered medications prior to visit.     Allergies reviewed  ROS Review of Systems  Constitutional:  Negative for chills and fever.  HENT:  Positive for congestion, postnasal drip and rhinorrhea. Negative for ear pain, sinus pressure and sore throat.    Eyes:  Negative for visual disturbance.  Respiratory:  Positive for cough. Negative for shortness of breath.   Cardiovascular:  Negative for chest pain.  Gastrointestinal:  Negative for nausea and vomiting.  Endocrine: Negative.   Genitourinary: Negative.   Musculoskeletal:  Negative for myalgias.  Skin: Negative.   Allergic/Immunologic: Negative.   Neurological: Negative.   Hematological: Negative.   Psychiatric/Behavioral: Negative.       Objective:      LMP 11/29/2012  Wt Readings from Last 3 Encounters:  10/19/20 207 lb 9.6 oz (94.2 kg)  06/14/20 209 lb 6.4 oz (95 kg)  04/25/20 209  lb (94.8 kg)     Health Maintenance Due  Topic Date Due   Pneumococcal Vaccine 70-22 Years old (1 - PCV) Never done   COLONOSCOPY (Pts 45-62yr Insurance coverage will need to be confirmed)  Never done   COVID-19 Vaccine (4 - Booster for Pfizer series) 06/24/2020    There are no preventive care reminders to display for this patient.  Lab Results  Component Value Date   TSH 25.800 (H) 10/19/2020   Lab Results  Component Value Date   WBC 8.3 03/18/2020   HGB 14.3 03/18/2020   HCT 42.9 03/18/2020   MCV 97.5 03/18/2020   PLT 168 03/18/2020   Lab Results  Component Value Date   NA 139 03/18/2020   K 4.0 03/18/2020   CO2 26 03/18/2020   GLUCOSE 102 (H) 03/18/2020   BUN 12 03/18/2020   CREATININE 0.84 03/18/2020   BILITOT 0.2 06/30/2019   ALKPHOS 75 06/30/2019   AST 20 06/30/2019   ALT 20 06/30/2019   PROT 6.7 06/30/2019   ALBUMIN 4.4 06/30/2019   CALCIUM 9.9 03/18/2020   ANIONGAP 8 03/18/2020   Lab Results  Component Value Date   CHOL 242 (H) 06/30/2019   Lab Results  Component Value Date   HDL 46 06/30/2019   Lab Results  Component Value Date   LDLCALC 145 (H) 06/30/2019   Lab Results  Component Value Date   TRIG 279 (H) 06/30/2019   Lab Results  Component Value Date   CHOLHDL 5.3 (H) 06/30/2019   Lab Results  Component Value Date   HGBA1C 5.1 04/25/2020       Assessment & Plan:   Problem List Items Addressed This Visit   None Visit Diagnoses     Acute upper respiratory infection    -  Primary   Relevant Medications   cetirizine (ZYRTEC ALLERGY) 10 MG tablet   fluticasone (FLONASE) 50 MCG/ACT nasal spray   benzonatate (TESSALON) 100 MG capsule       Assessment and Plan: 1. Acute upper respiratory infection Trial Zyrtec, Flonase, Tessalon Perles.  Patient education given on continuing rest and increase hydration.  Red flags given for prompt reevaluation. - cetirizine (ZYRTEC ALLERGY) 10 MG tablet; Take 1 tablet (10 mg total) by mouth daily.  Dispense: 30 tablet; Refill: 11 - fluticasone (FLONASE) 50 MCG/ACT nasal spray; Place 2 sprays into both nostrils daily.  Dispense: 16 g; Refill: 6 - benzonatate (TESSALON) 100 MG capsule; Take 1 capsule (100 mg total) by mouth 2 (two) times daily as needed for cough.  Dispense: 20 capsule; Refill: 0   Follow Up Instructions:    I discussed the assessment and treatment plan with the patient. The patient was provided an opportunity to ask questions and all were answered. The patient agreed with the plan and demonstrated an understanding of the instructions.   The patient was advised to call back or seek an in-person evaluation if the symptoms worsen or if the condition fails to improve as anticipated.  I provided 21 minutes of non-face-to-face time during this encounter.  Meds ordered this encounter  Medications   cetirizine (ZYRTEC ALLERGY) 10 MG tablet    Sig: Take 1 tablet (10 mg total) by mouth daily.    Dispense:  30 tablet    Refill:  11    Order Specific Question:   Supervising Provider    Answer:   WAsencion NobleE [[id number]]   fluticasone (FLONASE) 50 MCG/ACT nasal spray    Sig: Place 2 sprays  into both nostrils daily.    Dispense:  16 g    Refill:  6    Order Specific Question:   Supervising Provider    Answer:   Elsie Stain [[id number]]   benzonatate (TESSALON) 100 MG  capsule    Sig: Take 1 capsule (100 mg total) by mouth 2 (two) times daily as needed for cough.    Dispense:  20 capsule    Refill:  0    Order Specific Question:   Supervising Provider    Answer:   Elsie Stain [[id number]]    Follow-up: Return if symptoms worsen or fail to improve.    Loraine Grip Mayers, PA-C

## 2020-12-29 NOTE — Progress Notes (Signed)
Patient reports runny nose, watery eyes and congestion. Patient has had vaccination for C-19 Patient shares symptoms began Sunday. Patient has not taken medication today. Patient denies fever.

## 2020-12-30 NOTE — Patient Instructions (Signed)
I hope that you feel better soon.  Please let me know if you need anything else.  Kennieth Rad, PA-C Physician Assistant [facility] [email or web address]  Upper Respiratory Infection, Adult An upper respiratory infection (URI) is a common viral infection of the nose, throat, and upper air passages that lead to the lungs. The most common type of URI is the common cold. URIs usually get better on their own, without medicaltreatment. What are the causes? A URI is caused by a virus. You may catch a virus by: Breathing in droplets from an infected person's cough or sneeze. Touching something that has been exposed to the virus (contaminated) and then touching your mouth, nose, or eyes. What increases the risk? You are more likely to get a URI if: You are very young or very old. It is autumn or winter. You have close contact with others, such as at a daycare, school, or health care facility. You smoke. You have long-term (chronic) heart or lung disease. You have a weakened disease-fighting (immune) system. You have nasal allergies or asthma. You are experiencing a lot of stress. You work in an area that has poor air circulation. You have poor nutrition. What are the signs or symptoms? A URI usually involves some of the following symptoms: Runny or stuffy (congested) nose. Sneezing. Cough. Sore throat. Headache. Fatigue. Fever. Loss of appetite. Pain in your forehead, behind your eyes, and over your cheekbones (sinus pain). Muscle aches. Redness or irritation of the eyes. Pressure in the ears or face. How is this diagnosed? This condition may be diagnosed based on your medical history and symptoms, and a physical exam. Your health care provider may use a cotton swab to take a mucus sample from your nose (nasal swab). This sample can be tested to determine what virus is causing the illness. How is this treated? URIs usually get  better on their own within 7-10 days. You can take steps at home to relieve your symptoms. Medicines cannot cure URIs, but your health care provider may recommend certain medicines to help relieve symptoms, such as: Over-the-counter cold medicines. Cough suppressants. Coughing is a type of defense against infection that helps to clear the respiratory system, so take these medicines only as recommended by your health care provider. Fever-reducing medicines. Follow these instructions at home: Activity Rest as needed. If you have a fever, stay home from work or school until your fever is gone or until your health care provider says you are no longer contagious. Your health care provider may have you wear a face mask to prevent your infection from spreading. Relieving symptoms Gargle with a salt-water mixture 3-4 times a day or as needed. To make a salt-water mixture, completely dissolve -1 tsp of salt in 1 cup of warm water. Use a cool-mist humidifier to add moisture to the air. This can help you breathe more easily. Eating and drinking  Drink enough fluid to keep your urine pale yellow. Eat soups and other clear broths.  General instructions  Take over-the-counter and prescription medicines only as told by your health care provider. These include cold medicines, fever reducers, and cough suppressants. Do not use any products that contain nicotine or tobacco, such as cigarettes and e-cigarettes. If you need help quitting, ask your health care provider. Stay away from secondhand smoke. Stay up to date on all immunizations, including the yearly (annual) flu vaccine. Keep all follow-up visits as told by your health care provider. This is important.  How to  prevent the spread of infection to others  URIs can be passed from person to person (are contagious). To prevent the infection from spreading: Wash your hands often with soap and water. If soap and water are not available, use hand  sanitizer. Avoid touching your mouth, face, eyes, or nose. Cough or sneeze into a tissue or your sleeve or elbow instead of into your hand or into the air.  Contact a health care provider if: You are getting worse instead of better. You have a fever or chills. Your mucus is brown or red. You have yellow or brown discharge coming from your nose. You have pain in your face, especially when you bend forward. You have swollen neck glands. You have pain while swallowing. You have white areas in the back of your throat. Get help right away if: You have shortness of breath that gets worse. You have severe or persistent: Headache. Ear pain. Sinus pain. Chest pain. You have chronic lung disease along with any of the following: Wheezing. Prolonged cough. Coughing up blood. A change in your usual mucus. You have a stiff neck. You have changes in your: Vision. Hearing. Thinking. Mood. Summary An upper respiratory infection (URI) is a common infection of the nose, throat, and upper air passages that lead to the lungs. A URI is caused by a virus. URIs usually get better on their own within 7-10 days. Medicines cannot cure URIs, but your health care provider may recommend certain medicines to help relieve symptoms. This information is not intended to replace advice given to you by your health care provider. Make sure you discuss any questions you have with your healthcare provider. Document Revised: 01/28/2020 Document Reviewed: 01/28/2020 Elsevier Patient Education  [place].

## 2021-01-11 ENCOUNTER — Other Ambulatory Visit: Payer: Self-pay

## 2021-01-12 ENCOUNTER — Other Ambulatory Visit: Payer: Self-pay

## 2021-01-13 ENCOUNTER — Ambulatory Visit (INDEPENDENT_AMBULATORY_CARE_PROVIDER_SITE_OTHER): Payer: No Payment, Other | Admitting: Licensed Clinical Social Worker

## 2021-01-13 DIAGNOSIS — F418 Other specified anxiety disorders: Secondary | ICD-10-CM | POA: Diagnosis not present

## 2021-01-16 ENCOUNTER — Other Ambulatory Visit: Payer: Self-pay
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
## 2021-01-24 ENCOUNTER — Other Ambulatory Visit: Payer: Self-pay

## 2021-01-24 ENCOUNTER — Ambulatory Visit: Payer: Self-pay | Attending: Family Medicine | Admitting: Family Medicine

## 2021-01-24 ENCOUNTER — Encounter: Payer: Self-pay | Admitting: Family Medicine

## 2021-01-24 DIAGNOSIS — N3000 Acute cystitis without hematuria: Secondary | ICD-10-CM

## 2021-01-24 DIAGNOSIS — F319 Bipolar disorder, unspecified: Secondary | ICD-10-CM

## 2021-01-24 DIAGNOSIS — E039 Hypothyroidism, unspecified: Secondary | ICD-10-CM

## 2021-01-24 MED ORDER — NITROFURANTOIN MONOHYD MACRO 100 MG PO CAPS
100.0000 mg | ORAL_CAPSULE | Freq: Two times a day (BID) | ORAL | 0 refills | Status: DC
  Filled 2021-01-24: qty 10, 5d supply, fill #0

## 2021-01-24 NOTE — Progress Notes (Signed)
Virtual Visit via Telephone Note  I connected with [name], on 01/24/2021 at 9:26 AM by telephone due to the COVID-19 pandemic and verified that I am speaking with the correct person using two identifiers.   Consent: I discussed the limitations, risks, security and privacy concerns of performing an evaluation and management service by telephone and the availability of in person appointments. I also discussed with the patient that there may be a patient responsible charge related to this service. The patient expressed understanding and agreed to proceed.   Location of Patient: Home  Location of Provider: Clinic   Persons participating in Telemedicine visit: [name] Dr. Margarita Rana     History of Present Illness: Kristen Moore is a 50 y.o. year old female with  a history of bipolar depression, hypothyroidism, tobacco abuse, asthma.   Complains of dysuria, urinary frequency for the last 3 weeks without fever but she has lower abdominal pressure.  Saw Dermatology and had excision of lesion on face and is awaiting pathology. Bipolar disorder is managed by mental health.  She has been compliant with levothyroxine but her last TSH from 10/2020 was elevated.  She endorses presence of fatigue. Past Medical History:  Diagnosis Date   Abnormal Pap smear    cryo   Anemia    Asthma    Bipolar 1 disorder ([facility])    Cyst of breast    left breast   DUB (dysfunctional uterine bleeding)    Fibroids    Headache(784.0)    Hypothyroidism    IBS (irritable bowel syndrome)    Thyroid dysfunction      Current Outpatient Medications on File Prior to Visit  Medication Sig Dispense Refill   atorvastatin (LIPITOR) 40 MG tablet TAKE 1 TABLET (40 MG TOTAL) BY MOUTH DAILY. 30 tablet 3   benzonatate (TESSALON) 100 MG capsule Take 1 capsule (100 mg total) by mouth 2 (two) times daily as needed for cough. 20 capsule 0   BLACK COHOSH EXTRACT PO Take 2 tablets by mouth  daily.     buPROPion (WELLBUTRIN XL) 300 MG 24 hr tablet Take 1 tablet (300 mg total) by mouth daily. 30 tablet 2   busPIRone (BUSPAR) 10 MG tablet Take 1 tablet (10 mg total) by mouth 3 (three) times daily. 90 tablet 2   cetirizine (ZYRTEC ALLERGY) 10 MG tablet Take 1 tablet (10 mg total) by mouth daily. 30 tablet 11   fluticasone (FLONASE) 50 MCG/ACT nasal spray Place 2 sprays into both nostrils daily. 16 g 6   hydrOXYzine (ATARAX/VISTARIL) 10 MG tablet Take 0.5 tablets (5 mg total) by mouth 3 (three) times daily as needed. 45 tablet 2   levothyroxine (SYNTHROID) 175 MCG tablet Take 1 tablet (175 mcg total) by mouth daily. 30 tablet 3   lithium carbonate 300 MG capsule TAKE 1 CAPSULE BY MOUTH EVERY MORNING & TAKE 2 CAPSULES EVERY NIGHT AT BEDTIME 90 capsule 2   Multiple Vitamin (MULTIVITAMIN) capsule Take 2 capsules by mouth daily.      Multiple Vitamins-Minerals (ZINC PO) Take 2 tablets by mouth daily.     OVER THE COUNTER MEDICATION Take 2 tablets by mouth daily. Sea moss vitamin     Selenium Sulfide 2.25 % SHAM Use twice weekly 180 mL 1   No current facility-administered medications on file prior to visit.    ROS: See HPI  Observations/Objective: Awake, alert, oriented x2 Not in acute distress Normal mood.   CMP Latest Ref Rng & Units  03/18/2020 06/30/2019 05/29/2018  Glucose 70 - 99 mg/dL 102(H) 108(H) 87  BUN 6 - 20 mg/dL 12 14 11   Creatinine 0.44 - 1.00 mg/dL 0.84 0.69 0.87  Sodium 135 - 145 mmol/L 139 141 141  Potassium 3.5 - 5.1 mmol/L 4.0 3.9 4.7  Chloride 98 - 111 mmol/L 105 103 106  CO2 22 - 32 mmol/L 26 24 23   Calcium 8.9 - 10.3 mg/dL 9.9 9.7 10.3(H)  Total Protein 6.0 - 8.5 g/dL - 6.7 7.0  Total Bilirubin 0.0 - 1.2 mg/dL - 0.2 0.3  Alkaline Phos 39 - 117 IU/L - 75 79  AST 0 - 40 IU/L - 20 17  ALT 0 - 32 IU/L - 20 24    Lipid Panel     Component Value Date/Time   CHOL 242 (H) 06/30/2019 1528   TRIG 279 (H) 06/30/2019 1528   HDL 46 06/30/2019 1528   CHOLHDL  5.3 (H) 06/30/2019 1528   CHOLHDL 5.1 04/18/2018 1056   VLDL 56 (H) 04/18/2018 1056   LDLCALC 145 (H) 06/30/2019 1528   LABVLDL 51 (H) 06/30/2019 1528    Lab Results  Component Value Date   HGBA1C 5.1 04/25/2020    Lab Results  Component Value Date   TSH 25.800 (H) 10/19/2020    Assessment and Plan: 1. Hypothyroidism, unspecified type Uncontrolled Could explain fatigue Will send of thyroid labs and adjust regimen accordingly - T4, free; Future - TSH; Future - CMP14+EGFR; Future - Lipid panel; Future  2. Bipolar depression ([place]) Stable Continue lithium She has not had a lithium level in a while hence I will send off labs - Lithium level; Future  3. Acute cystitis without hematuria Will treat due to presence of symptoms - nitrofurantoin, macrocrystal-monohydrate, (MACROBID) 100 MG capsule; Take 1 capsule (100 mg total) by mouth 2 (two) times daily.  Dispense: 10 capsule; Refill: 0   Follow Up Instructions: Return in about 3 months (around 04/26/2021) for Medical conditions.    I discussed the assessment and treatment plan with the patient. The patient was provided an opportunity to ask questions and all were answered. The patient agreed with the plan and demonstrated an understanding of the instructions.   The patient was advised to call back or seek an in-person evaluation if the symptoms worsen or if the condition fails to improve as anticipated.     I provided 14 minutes total of non-face-to-face time during this encounter.   Charlott Rakes, MD, FAAFP. [facility] [place], [place]   01/24/2021, 9:26 AM

## 2021-01-26 ENCOUNTER — Other Ambulatory Visit: Payer: Self-pay

## 2021-01-26 NOTE — Addendum Note (Signed)
Addended bySteffanie Dunn on: 01/26/2021 09:47 AM   Modules accepted: Orders

## 2021-01-27 ENCOUNTER — Telehealth (INDEPENDENT_AMBULATORY_CARE_PROVIDER_SITE_OTHER): Payer: Self-pay | Admitting: Primary Care

## 2021-01-27 ENCOUNTER — Other Ambulatory Visit: Payer: Self-pay | Admitting: Family Medicine

## 2021-01-27 ENCOUNTER — Other Ambulatory Visit: Payer: Self-pay

## 2021-01-27 DIAGNOSIS — E039 Hypothyroidism, unspecified: Secondary | ICD-10-CM

## 2021-01-27 LAB — CMP14+EGFR
ALT: 17 IU/L (ref 0–32)
AST: 14 IU/L (ref 0–40)
Albumin/Globulin Ratio: 2.3 — ABNORMAL HIGH (ref 1.2–2.2)
Albumin: 4.5 g/dL (ref 3.8–4.8)
Alkaline Phosphatase: 77 IU/L (ref 44–121)
BUN/Creatinine Ratio: 17 (ref 9–23)
BUN: 12 mg/dL (ref 6–24)
Bilirubin Total: 0.4 mg/dL (ref 0.0–1.2)
CO2: 24 mmol/L (ref 20–29)
Calcium: 9.9 mg/dL (ref 8.7–10.2)
Chloride: 106 mmol/L (ref 96–106)
Creatinine, Ser: 0.71 mg/dL (ref 0.57–1.00)
Globulin, Total: 2 g/dL (ref 1.5–4.5)
Glucose: 91 mg/dL (ref 65–99)
Potassium: 4.3 mmol/L (ref 3.5–5.2)
Sodium: 142 mmol/L (ref 134–144)
Total Protein: 6.5 g/dL (ref 6.0–8.5)
eGFR: 105 mL/min/{1.73_m2} (ref >59)

## 2021-01-27 LAB — LIPID PANEL
Chol/HDL Ratio: 2.3 ratio (ref 0.0–4.4)
Cholesterol, Total: 110 mg/dL (ref 100–199)
HDL: 47 mg/dL (ref >39)
LDL Chol Calc (NIH): 49 mg/dL (ref 0–99)
Triglycerides: 64 mg/dL (ref 0–149)
VLDL Cholesterol Cal: 14 mg/dL (ref 5–40)

## 2021-01-27 LAB — T4, FREE: Free T4: 2.25 ng/dL — ABNORMAL HIGH (ref 0.82–1.77)

## 2021-01-27 LAB — LITHIUM LEVEL: Lithium Lvl: 0.8 mmol/L (ref 0.5–1.2)

## 2021-01-27 LAB — TSH: TSH: 0.107 u[IU]/mL — ABNORMAL LOW (ref 0.450–4.500)

## 2021-01-27 MED ORDER — LEVOTHYROXINE SODIUM 150 MCG PO TABS
150.0000 ug | ORAL_TABLET | Freq: Every day | ORAL | 6 refills | Status: DC
  Filled 2021-01-27: qty 30, 30d supply, fill #0
  Filled 2021-02-27: qty 30, 30d supply, fill #1
  Filled 2021-03-24: qty 30, 30d supply, fill #2
  Filled 2021-04-25 – 2021-05-02 (×2): qty 30, 30d supply, fill #3
  Filled 2021-06-01: qty 30, 30d supply, fill #4

## 2021-01-27 NOTE — Telephone Encounter (Signed)
Pt wants Kristen Moore to know she went to the dermatologist to know the mole on her face turned out to be cancer.  She has to have surgery on her face. It is the kind where they have to dig out the roots to get it all.

## 2021-01-27 NOTE — Telephone Encounter (Signed)
FYI

## 2021-02-14 ENCOUNTER — Other Ambulatory Visit: Payer: Self-pay

## 2021-02-16 ENCOUNTER — Other Ambulatory Visit: Payer: Self-pay

## 2021-02-16 ENCOUNTER — Ambulatory Visit (INDEPENDENT_AMBULATORY_CARE_PROVIDER_SITE_OTHER): Payer: No Payment, Other | Admitting: Licensed Clinical Social Worker

## 2021-02-16 DIAGNOSIS — F411 Generalized anxiety disorder: Secondary | ICD-10-CM

## 2021-02-16 NOTE — Progress Notes (Signed)
   THERAPIST PROGRESS NOTE   Virtual Visit via Telephone Note  I connected with Kristen Moore on 02/16/21 at 11:00 AM EDT by telephone and verified that I am speaking with the correct person using two identifiers.  Location: Patient: Home Provider: [facility]   I discussed the limitations, risks, security and privacy concerns of performing an evaluation and management service by telephone and the availability of in person appointments. I also discussed with the patient that there may be a patient responsible charge related to this service. The patient expressed understanding and agreed to proceed. I discussed the assessment and treatment plan with the patient. The patient was provided an opportunity to ask questions and all were answered. The patient agreed with the plan and demonstrated an understanding of the instructions.   The patient was advised to call back or seek an in-person evaluation if the symptoms worsen or if the condition fails to improve as anticipated.  I provided 40 minutes of non-face-to-face time during this encounter.  Participation Level: Active  Behavioral Response: CasualAlertAnxious  Type of Therapy: Individual Therapy  Treatment Goals addressed: Communication: dep/anx/coping  Interventions: Supportive  Summary: Kristen Moore is a 50 y.o. female who presents with hx of anx/dep. This date pt signs on from her space in the attic but there is no audio and pt cannot get audio to function. She agrees to a phone session. 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 Pt states she went to dermatologist to see about an area on her leg she was concerned about, which was fine, and the CA on her face was discovered. Pt states she is having surgery on Mon and is worried about how deep they will have to go. She states the person she works for at [employer] is paying for her surgery and pt will have to work off the  payback. She reports they would not schedule surgery without payment in advance. Pt expresses gratitude for financial assist. Helped pt problem solve how she will monitor/record keep the payback given the past payment relationship with this employer. Pt verbalizes understanding. She advises of strain r/t finances for self and the household. Pt continues to get only limited hours at [employer]. Pt provides updates on dtr and her grandtr. Addressed coping with dtr's ongoing addiction. Pt reports she and mother are going to be able to see grandtr this Sun at church as foster mother told them what church to come to. She is very excited about this. Child turned 3 yo Sep 8. Pt is taking meds as prescribed. LCSW reviewed poc including scheduling prior to close of session. Pt states appreciation for care.     Suicidal/Homicidal: Nowithout intent/plan  Therapist Response: Pt remains receptive to care.  Plan: Return again for next avail appt.  Diagnosis: Axis I: Generalized Anxiety Disorder  [name], LCSW 02/16/2021

## 2021-02-20 ENCOUNTER — Other Ambulatory Visit: Payer: Self-pay

## 2021-02-20 MED ORDER — DOXYCYCLINE HYCLATE 100 MG PO CAPS
ORAL_CAPSULE | ORAL | 0 refills | Status: DC
  Filled 2021-02-20: qty 10, 5d supply, fill #0

## 2021-02-27 ENCOUNTER — Other Ambulatory Visit: Payer: Self-pay

## 2021-03-01 ENCOUNTER — Other Ambulatory Visit: Payer: Self-pay

## 2021-03-07 ENCOUNTER — Other Ambulatory Visit: Payer: Self-pay

## 2021-03-07 ENCOUNTER — Telehealth (INDEPENDENT_AMBULATORY_CARE_PROVIDER_SITE_OTHER): Payer: No Payment, Other | Admitting: Psychiatry

## 2021-03-07 ENCOUNTER — Encounter (HOSPITAL_COMMUNITY): Payer: Self-pay | Admitting: Psychiatry

## 2021-03-07 DIAGNOSIS — F319 Bipolar disorder, unspecified: Secondary | ICD-10-CM

## 2021-03-07 DIAGNOSIS — F411 Generalized anxiety disorder: Secondary | ICD-10-CM | POA: Diagnosis not present

## 2021-03-07 MED ORDER — BUPROPION HCL ER (XL) 300 MG PO TB24
300.0000 mg | ORAL_TABLET | Freq: Every day | ORAL | 3 refills | Status: DC
  Filled 2021-03-07 – 2021-03-24 (×2): qty 30, 30d supply, fill #0
  Filled 2021-04-25 – 2021-05-02 (×2): qty 30, 30d supply, fill #1
  Filled 2021-06-01: qty 30, 30d supply, fill #2

## 2021-03-07 MED ORDER — BUSPIRONE HCL 10 MG PO TABS
10.0000 mg | ORAL_TABLET | Freq: Three times a day (TID) | ORAL | 3 refills | Status: DC
  Filled 2021-03-07: qty 90, 30d supply, fill #0

## 2021-03-07 MED ORDER — HYDROXYZINE HCL 10 MG PO TABS
5.0000 mg | ORAL_TABLET | Freq: Three times a day (TID) | ORAL | 3 refills | Status: DC | PRN
  Filled 2021-03-07 – 2021-06-28 (×3): qty 45, 30d supply, fill #0
  Filled 2021-08-31: qty 45, 30d supply, fill #1
  Filled 2021-11-01: qty 45, 30d supply, fill #2

## 2021-03-07 MED ORDER — LITHIUM CARBONATE 300 MG PO CAPS
ORAL_CAPSULE | ORAL | 3 refills | Status: DC
  Filled 2021-03-07: qty 90, fill #0
  Filled 2021-03-24: qty 90, 30d supply, fill #0
  Filled 2021-04-25 – 2021-05-02 (×2): qty 90, 30d supply, fill #1
  Filled 2021-06-01: qty 90, 30d supply, fill #2

## 2021-03-07 NOTE — Progress Notes (Signed)
[facility] MD/PA/NP OP Progress Note Virtual Visit via Telephone Note  I connected with Kristen Moore on 03/07/21 at  1:30 PM EDT by telephone and verified that I am speaking with the correct person using two identifiers.  Location: Patient: home Provider: Clinic   I discussed the limitations, risks, security and privacy concerns of performing an evaluation and management service by telephone and the availability of in person appointments. I also discussed with the patient that there may be a patient responsible charge related to this service. The patient expressed understanding and agreed to proceed.   I provided 30 minutes of non-face-to-face time during this encounter.       12/08/2020 10:01 AM Kristen Moore  MRN:  [id number]  Chief Complaint: "I had surgery but Im doing okay"   HPI: 50 year old female seen today for follow up psychiatric evaluation. She has a psychiatric history of anxiety, depression, and bipolar disorder. She is currently being managed on Wellbutrin XL 300 mg daily, hydroxyzine 5 mg three times daily, Buspar 10 mg three times, and lithium 300 mg in the morning and 600 mg nightly. She noted that her medications are effective in managing her psychiatric conditions.    Today patient was unable to login virtually so assessment was done over the phone.  During exam she was pleasant, cooperative, and engaged in conversation.  She informed Probation officer that recently she had surgery, noting that she had skin cancer on her face.  She notes that her employer paid for her surgery and she notes that she would have to pay her debt off over time.  She informed Probation officer that currently she is working for free.  She notes that she continues to work at the [employer] and does laundry as needed.  Patient notes that at times life is stressful.  She informed Probation officer that she is concerned about getting food.  Provider informed patient of Meals on Wheels for herself and her family.  Provider  also notes that at times the [employer] and the [employer] could be effective in getting foods.  She notes that she will call them at her convenience.  Patient notes that she is also concerned about her daughter who she has not seen in a week.  She notes that her daughter continues to abuse illegal substances as well as prostitute.  Provider informed patient that she should do a wellness check on her daughter.  She endorsed understanding and notes that she would if one of her friends cannot find her today.    Patient notes that the above exacerbates her anxiety and depression.  Provider conducted a GAD-7 and patient scored a 15, at her last visit she scored a 14.  Provider also conducted PHQ-9 and patient scored 2, at her last visit she scored a 10.  She endorses adequate sleep and appetite.  Today she denies SI/HI/VH, mania, or paranoia.    No medication changes made today.  Patient agreeable to continue medication as prescribed.  She will follow-up with outpatient counseling for therapy.  No other concerns noted at this time.   .    Visit Diagnosis:    ICD-10-CM   1. Bipolar depression (HCC)  F31.9 buPROPion (WELLBUTRIN XL) 300 MG 24 hr tablet    busPIRone (BUSPAR) 10 MG tablet    lithium carbonate 300 MG capsule    2. Generalized anxiety disorder  F41.1 busPIRone (BUSPAR) 10 MG tablet    hydrOXYzine (ATARAX/VISTARIL) 10 MG tablet  Past Psychiatric History:  anxiety, depression, and bipolar disorder.  Past Medical History:  Past Medical History:  Diagnosis Date   Abnormal Pap smear    cryo   Anemia    Asthma    Bipolar 1 disorder ([place])    Cyst of breast    left breast   DUB (dysfunctional uterine bleeding)    Fibroids    Headache(784.0)    Hypothyroidism    IBS (irritable bowel syndrome)    Thyroid dysfunction     Past Surgical History:  Procedure Laterality Date   ABDOMINAL HYSTERECTOMY Bilateral 12/25/2012   Procedure: HYSTERECTOMY ABDOMINAL;  Surgeon: Emily Filbert, MD;  Location: [facility];  Service: Gynecology;  Laterality: Bilateral;  TAH and bilateral salpingectomy   BILATERAL SALPINGECTOMY Bilateral 12/25/2012   Procedure: BILATERAL SALPINGECTOMY;  Surgeon: Emily Filbert, MD;  Location: [facility];  Service: Gynecology;  Laterality: Bilateral;   CERVICAL BIOPSY  W/ LOOP ELECTRODE EXCISION     CHOLECYSTECTOMY     COLON SURGERY     DILATION AND CURETTAGE OF UTERUS     OOPHORECTOMY Bilateral 12/25/2012   Procedure: OOPHORECTOMY;  Surgeon: Emily Filbert, MD;  Location: [facility];  Service: Gynecology;  Laterality: Bilateral;   TUBAL LIGATION      Family Psychiatric History: Daughter substance abuse  Family History:  Family History  Problem Relation Age of Onset   Heart disease Father    Cancer Father 50       lung   Hypertension Mother    Rheum arthritis Mother    Fibromyalgia Mother    COPD Mother    Sleep apnea Mother    Thyroid disease Mother    Breast cancer Maternal Grandmother    Breast cancer Paternal Aunt     Social History:  Social History   Socioeconomic History   Marital status: Single    Spouse name: Not on file   Number of children: 1   Years of education: Not on file   Highest education level: Associate degree: occupational, Hotel manager, or vocational program  Occupational History   Not on file  Tobacco Use   Smoking status: Every Day    Packs/day: 0.50    Years: 9.00    Pack years: 4.50    Types: Cigarettes   Smokeless tobacco: Never  Vaping Use   Vaping Use: Never used  Substance and Sexual Activity   Alcohol use: No    Alcohol/week: 0.0 standard drinks   Drug use: No   Sexual activity: Not Currently    Birth control/protection: Surgical  Other Topics Concern   Not on file  Social History Narrative   Mother has ovarian cancer that has spread to lungs, Dad has lung cancer that has spread to kidneys, and disabled brother all live in home with patient. Patient is primary caregiver. Daughter is drug addict, and has  loss custody of patient's infant granddaughter in 02/2020. Patient works as a Scientist, water quality. Patient is very stressed and saddened about her current situation.    Social Determinants of Health   Financial Resource Strain: Not on file  Food Insecurity: Not on file  Transportation Needs: No Transportation Needs   Lack of Transportation (Medical): No   Lack of Transportation (Non-Medical): No  Physical Activity: Not on file  Stress: Not on file  Social Connections: Not on file    Allergies:   Metabolic Disorder Labs: Lab Results  Component Value Date   HGBA1C 5.1 04/25/2020   MPG 88.19 04/18/2018  No results found for: PROLACTIN Lab Results  Component Value Date   CHOL 242 (H) 06/30/2019   TRIG 279 (H) 06/30/2019   HDL 46 06/30/2019   CHOLHDL 5.3 (H) 06/30/2019   VLDL 56 (H) 04/18/2018   LDLCALC 145 (H) 06/30/2019   LDLCALC 124 (H) 04/18/2018   Lab Results  Component Value Date   TSH 25.800 (H) 10/19/2020   TSH 0.485 06/30/2019    Therapeutic Level Labs: Lab Results  Component Value Date   LITHIUM 0.3 (L) 04/30/2018   LITHIUM 0.58 (L) 07/17/2010   No results found for: VALPROATE No components found for:  CBMZ  Current Medications: Current Outpatient Medications  Medication Sig Dispense Refill   atorvastatin (LIPITOR) 40 MG tablet TAKE 1 TABLET (40 MG TOTAL) BY MOUTH DAILY. 30 tablet 3   BLACK COHOSH EXTRACT PO Take 2 tablets by mouth daily.     buPROPion (WELLBUTRIN XL) 300 MG 24 hr tablet Take 1 tablet (300 mg total) by mouth daily. 30 tablet 2   busPIRone (BUSPAR) 10 MG tablet Take 1 tablet (10 mg total) by mouth 3 (three) times daily. 90 tablet 2   hydrOXYzine (ATARAX/VISTARIL) 10 MG tablet Take 0.5 tablets (5 mg total) by mouth 3 (three) times daily as needed. 45 tablet 2   levothyroxine (SYNTHROID) 175 MCG tablet Take 1 tablet (175 mcg total) by mouth daily. 30 tablet 3   lithium carbonate 300 MG capsule TAKE 1 CAPSULE BY MOUTH EVERY MORNING & TAKE 2 CAPSULES  EVERY NIGHT AT BEDTIME 90 capsule 2   Multiple Vitamin (MULTIVITAMIN) capsule Take 2 capsules by mouth daily.      Multiple Vitamins-Minerals (ZINC PO) Take 2 tablets by mouth daily.     OVER THE COUNTER MEDICATION Take 2 tablets by mouth daily. Sea moss vitamin     predniSONE (DELTASONE) 20 MG tablet Take 1 tablet (20 mg total) by mouth daily with breakfast. 5 tablet 0   Selenium Sulfide 2.25 % SHAM Use twice weekly 180 mL 1   No current facility-administered medications for this visit.     Musculoskeletal: Strength & Muscle Tone:  Unable to assess due to telephone visit Gait & Station:  Unable to assess due to telephone visit Patient leans: N/A  Psychiatric Specialty Exam: Review of Systems  Last menstrual period 11/29/2012.There is no height or weight on file to calculate BMI.  General Appearance:  Unable to assess due to telephone visit  Eye Contact:   Unable to assess due to telephone visit  Speech:  Clear and Coherent and Normal Rate  Volume:  Normal  Mood:  Anxious and Depressed  Affect:  Appropriate and Congruent  Thought Process:  Coherent, Goal Directed and Linear  Orientation:  Full (Time, Place, and Person)  Thought Content: WDL and Logical   Suicidal Thoughts:  No  Homicidal Thoughts:  No  Memory:  Immediate;   Good Recent;   Good Remote;   Good  Judgement:  Good  Insight:  Good  Psychomotor Activity:  Normal  Concentration:  Concentration: Good and Attention Span: Good  Recall:  Good  Fund of Knowledge: Good  Language: Good  Akathisia:  No  Handed:  Right  AIMS (if indicated): Not done  Assets:  Communication Skills Desire for Improvement Financial Resources/Insurance Housing Social Support  ADL's:  Intact  Cognition: WNL  Sleep:  Good   Screenings: GAD-7    Flowsheet Row Video Visit from 12/08/2020 in [facility] Office Visit from 10/19/2020 in  [facility] Video Visit from 09/09/2020 in  [facility] Office Visit from 04/25/2020 in [facility] from 12/17/2019 in [facility]  Total GAD-7 Score 14 12 21 6 19       PHQ2-9    Flowsheet Row Video Visit from 12/08/2020 in [facility] Office Visit from 10/19/2020 in [facility] Video Visit from 09/09/2020 in [facility] Office Visit from 04/25/2020 in [facility] from 12/17/2019 in [facility]  PHQ-2 Total Score 5 3 3 2 6   PHQ-9 Total Score 10 11 11 6 17       Flowsheet Row Video Visit from 09/09/2020 in [facility] Office Visit from 05/07/2019 in [facility] No Risk Error: Q3, 4, or 5 should not be populated when Q2 is No        Assessment and Plan: Patient endorses increased anxiety and depression due to life stressors.  She however notes that she can cope with it.  No medication changes made today.  Patient agreeable to continue medications as prescribed.    1. Bipolar depression ([facility])  Continue- buPROPion (WELLBUTRIN XL) 300 MG 24 hr tablet; Take 1 tablet (300 mg total) by mouth daily.  Dispense: 30 tablet; Refill: 3 Continue- lithium carbonate 300 MG capsule; Take 1-2 capsules (300-600 mg total) by mouth 2 (two) times daily with a meal. Takes 300 mg in the morning and 600 mg at bedtime  Dispense: 90 capsule; Refill: 3 Continue- busPIRone (BUSPAR) 10 MG tablet; Take 1 tablet (10 mg total) by mouth 3 (three) times daily.  Dispense: 90 tablet; Refill: 3  2. Generalized anxiety disorder  Continue- hydrOXYzine (ATARAX/VISTARIL) 10 MG tablet; Take 0.5 tablets (5 mg total) by mouth 3 (three) times daily as needed.  Dispense: 45 tablet; Refill: 3 Continue- busPIRone (BUSPAR) 10 MG tablet; Take 1 tablet (10 mg total) by mouth 3  (three) times daily.  Dispense: 90 tablet; Refill: 3  Follow up in 3 months Follow up with therapy   Salley Slaughter, NP 12/08/2020, 10:01 AM

## 2021-03-14 ENCOUNTER — Other Ambulatory Visit: Payer: Self-pay

## 2021-03-15 ENCOUNTER — Ambulatory Visit: Payer: Self-pay | Attending: Primary Care | Admitting: Pharmacist

## 2021-03-15 ENCOUNTER — Other Ambulatory Visit: Payer: Self-pay

## 2021-03-15 DIAGNOSIS — Z23 Encounter for immunization: Secondary | ICD-10-CM

## 2021-03-15 NOTE — Progress Notes (Signed)
Patient presents for vaccination against influenza per orders of [facility]. Consent given. Counseling provided. No contraindications exists. Vaccine administered without incident.   Benard Halsted, PharmD, [place], [place] [phone]

## 2021-03-16 ENCOUNTER — Ambulatory Visit (INDEPENDENT_AMBULATORY_CARE_PROVIDER_SITE_OTHER): Payer: No Payment, Other | Admitting: Licensed Clinical Social Worker

## 2021-03-16 DIAGNOSIS — F411 Generalized anxiety disorder: Secondary | ICD-10-CM | POA: Diagnosis not present

## 2021-03-16 NOTE — Progress Notes (Signed)
   THERAPIST PROGRESS NOTE   Virtual Visit via Video Note  I connected with Kristen Moore on 03/16/21 at 11:00 AM EDT by a video enabled telemedicine application and verified that I am speaking with the correct person using two identifiers.  Location: Patient: Home Provider: [facility]   I discussed the limitations of evaluation and management by telemedicine and the availability of in person appointments. The patient expressed understanding and agreed to proceed. I discussed the assessment and treatment plan with the patient. The patient was provided an opportunity to ask questions and all were answered. The patient agreed with the plan and demonstrated an understanding of the instructions.   The patient was advised to call back or seek an in-person evaluation if the symptoms worsen or if the condition fails to improve as anticipated.  I provided 45 minutes of non-face-to-face time during this encounter.  Participation Level: Active  Behavioral Response: CasualAlertAnxious  Type of Therapy: Individual Therapy  Treatment Goals addressed: Communication: anx/dep/coping  Interventions: Solution Focused, Supportive, and Reframing  Summary: Kristen Moore is a 50 y.o. female who presents with history of anxiety and depression. This date patient is located in her space in the attic; she is there alone.  Today patient's audio and video are working for the session which is a nice improvement.  LCSW assesses for patient's surgery for her basal cell skin cancer. Patient states the surgery went well yet she is concerned about the scar that she has on her face. Patient also states she ended up having to be out of work for 2 weeks and her finances are in worse shape than ever. Very stressed about finances. Reports they do have essentials and meds. Patient provides updates on her daughter Kristen Moore which is essentially unchanged.  Again addressed patient enabling Kristen Moore and avoiding that behavior.  LCSW  assessed for patient's ability to see her grandchild at church.  Patient was able to see grandchild at church and provides updates. She states how adorable this child is and hopes that she will be able to be involved in the child's life ongoing.  Patient shares that her mother has decided that she does not want to get involved in the child's life and suggests that the patient does not do so either.  Kristen Moore shares how conflicted she is about this and not wanting to make her mother mad.  LCSW provided support and active listening and assisted patient to process thoughts/feelings and problem solve. LCSW reviewed poc including scheduling prior to close of session. Pt states appreciation for care.   Suicidal/Homicidal: Nowithout intent/plan  Therapist Response: Pt remains receptive to care.  Plan: Return again for next avail appt.  Diagnosis: Axis I: Generalized Anxiety Disorder   [name], LCSW 03/16/2021

## 2021-03-24 ENCOUNTER — Other Ambulatory Visit: Payer: Self-pay

## 2021-03-24 ENCOUNTER — Other Ambulatory Visit: Payer: Self-pay | Admitting: Family Medicine

## 2021-03-24 DIAGNOSIS — E7800 Pure hypercholesterolemia, unspecified: Secondary | ICD-10-CM

## 2021-03-25 MED ORDER — ATORVASTATIN CALCIUM 40 MG PO TABS
40.0000 mg | ORAL_TABLET | Freq: Every day | ORAL | 3 refills | Status: DC
  Filled 2021-03-25: qty 30, 30d supply, fill #0
  Filled 2021-04-25 – 2021-05-02 (×2): qty 30, 30d supply, fill #1
  Filled 2021-06-01: qty 30, 30d supply, fill #2
  Filled 2021-06-28: qty 30, 30d supply, fill #0
  Filled 2021-06-28: qty 30, 30d supply, fill #3

## 2021-03-25 NOTE — Telephone Encounter (Signed)
Requested Prescriptions  Pending Prescriptions Disp Refills  . atorvastatin (LIPITOR) 40 MG tablet 30 tablet 3    Sig: TAKE 1 TABLET (40 MG TOTAL) BY MOUTH DAILY.     Cardiovascular:  Antilipid - Statins Passed - 03/24/2021 11:14 AM      Passed - Total Cholesterol in normal range and within 360 days    Cholesterol, Total  Date Value Ref Range Status  01/26/2021 110 100 - 199 mg/dL Final         Passed - LDL in normal range and within 360 days    LDL Chol Calc (NIH)  Date Value Ref Range Status  01/26/2021 49 0 - 99 mg/dL Final         Passed - HDL in normal range and within 360 days    HDL  Date Value Ref Range Status  01/26/2021 47 >39 mg/dL Final         Passed - Triglycerides in normal range and within 360 days    Triglycerides  Date Value Ref Range Status  01/26/2021 64 0 - 149 mg/dL Final         Passed - Patient is not pregnant      Passed - Valid encounter within last 12 months    Recent Outpatient Visits          2 months ago Hypothyroidism, unspecified type   [place], Charlane Ferretti, MD   5 months ago Hair loss   [place], Charlane Ferretti, MD   11 months ago Screening for diabetes mellitus   [facility] Kerin Perna, NP   1 year ago Blood in stool   [place], Michelle P, NP   1 year ago Hypothyroidism, unspecified type   [place] Kerin Perna, NP

## 2021-03-27 ENCOUNTER — Other Ambulatory Visit: Payer: Self-pay

## 2021-03-30 ENCOUNTER — Other Ambulatory Visit: Payer: Self-pay

## 2021-04-05 ENCOUNTER — Ambulatory Visit: Payer: No Typology Code available for payment source

## 2021-04-06 ENCOUNTER — Other Ambulatory Visit: Payer: Self-pay

## 2021-04-06 ENCOUNTER — Telehealth: Payer: Self-pay | Admitting: Family Medicine

## 2021-04-06 ENCOUNTER — Encounter: Payer: Self-pay | Admitting: Physician Assistant

## 2021-04-06 ENCOUNTER — Ambulatory Visit: Payer: Self-pay | Admitting: *Deleted

## 2021-04-06 ENCOUNTER — Ambulatory Visit: Payer: Self-pay | Attending: Physician Assistant | Admitting: Physician Assistant

## 2021-04-06 DIAGNOSIS — J069 Acute upper respiratory infection, unspecified: Secondary | ICD-10-CM

## 2021-04-06 DIAGNOSIS — R051 Acute cough: Secondary | ICD-10-CM

## 2021-04-06 MED ORDER — AZITHROMYCIN 250 MG PO TABS
ORAL_TABLET | ORAL | 0 refills | Status: DC
  Filled 2021-04-06: qty 6, 5d supply, fill #0

## 2021-04-06 MED ORDER — BENZONATATE 100 MG PO CAPS
100.0000 mg | ORAL_CAPSULE | Freq: Two times a day (BID) | ORAL | 0 refills | Status: DC | PRN
  Filled 2021-04-06: qty 20, 10d supply, fill #0

## 2021-04-06 NOTE — Progress Notes (Signed)
Established Patient Office Visit  Subjective:  Patient ID: Kristen Moore, female    DOB: [birth year]  Age: 50 y.o. MRN: [id number]  CC:  Chief Complaint  Patient presents with   URI   Virtual Visit via Telephone Note  I connected with Kristen Moore on 04/06/21 at  1:30 PM EDT by telephone and verified that I am speaking with the correct person using two identifiers.  Location: Patient: Home Provider: [facility]   I discussed the limitations, risks, security and privacy concerns of performing an evaluation and management service by telephone and the availability of in person appointments. I also discussed with the patient that there may be a patient responsible charge related to this service. The patient expressed understanding and agreed to proceed.   History of Present Illness: Kristen Moore reports that she has been experiencing shortness of breath and wheezing for approximately 1 month.  Reports that she has also in the last week started experiencing a tight chest, sore throat, productive cough with green sputum and sinus pressure.  Denies fevers, chills, nausea or vomiting.  States that she is eating and drinking okay.  States that she has a brother and daughter with similar symptoms.  Has not taken any home COVID test.  Reports that she has been taking daytime cold medication with a small amount of relief.   Observations/Objective: Medical history and current medications reviewed, no physical exam completed     Past Medical History:  Diagnosis Date   Abnormal Pap smear    cryo   Anemia    Asthma    Bipolar 1 disorder (HCC)    Cyst of breast    left breast   DUB (dysfunctional uterine bleeding)    Fibroids    Headache(784.0)    Hypothyroidism    IBS (irritable bowel syndrome)    Thyroid dysfunction     Past Surgical History:  Procedure Laterality Date   ABDOMINAL HYSTERECTOMY Bilateral 12/25/2012   Procedure: HYSTERECTOMY ABDOMINAL;  Surgeon:  Emily Filbert, MD;  Location: [facility];  Service: Gynecology;  Laterality: Bilateral;  TAH and bilateral salpingectomy   BILATERAL SALPINGECTOMY Bilateral 12/25/2012   Procedure: BILATERAL SALPINGECTOMY;  Surgeon: Emily Filbert, MD;  Location: [facility];  Service: Gynecology;  Laterality: Bilateral;   CERVICAL BIOPSY  W/ LOOP ELECTRODE EXCISION     CHOLECYSTECTOMY     COLON SURGERY     DILATION AND CURETTAGE OF UTERUS     OOPHORECTOMY Bilateral 12/25/2012   Procedure: OOPHORECTOMY;  Surgeon: Emily Filbert, MD;  Location: [facility];  Service: Gynecology;  Laterality: Bilateral;   TUBAL LIGATION      Family History  Problem Relation Age of Onset   Heart disease Father    Cancer Father 21       lung   Hypertension Mother    Rheum arthritis Mother    Fibromyalgia Mother    COPD Mother    Sleep apnea Mother    Thyroid disease Mother    Breast cancer Maternal Grandmother    Breast cancer Paternal Aunt     Social History   Socioeconomic History   Marital status: Single    Spouse name: Not on file   Number of children: 1   Years of education: Not on file   Highest education level: Associate degree: occupational, Hotel manager, or vocational program  Occupational History   Not on file  Tobacco Use   Smoking status: Every Day    Packs/day: 0.50  Years: 9.00    Pack years: 4.50    Types: Cigarettes   Smokeless tobacco: Never  Vaping Use   Vaping Use: Never used  Substance and Sexual Activity   Alcohol use: No    Alcohol/week: 0.0 standard drinks   Drug use: No   Sexual activity: Not Currently    Birth control/protection: Surgical  Other Topics Concern   Not on file  Social History Narrative   Mother has ovarian cancer that has spread to lungs, Dad has lung cancer that has spread to kidneys, and disabled brother all live in home with patient. Patient is primary caregiver. Daughter is drug addict, and has loss custody of patient's infant granddaughter in 02/2020. Patient works as a Scientist, water quality.  Patient is very stressed and saddened about her current situation.    Social Determinants of Health   Financial Resource Strain: Not on file  Food Insecurity: Not on file  Transportation Needs: No Transportation Needs   Lack of Transportation (Medical): No   Lack of Transportation (Non-Medical): No  Physical Activity: Not on file  Stress: Not on file  Social Connections: Not on file  Intimate Partner Violence: Not on file    Outpatient Medications Prior to Visit  Medication Sig Dispense Refill   atorvastatin (LIPITOR) 40 MG tablet TAKE 1 TABLET (40 MG TOTAL) BY MOUTH DAILY. 30 tablet 3   BLACK COHOSH EXTRACT PO Take 2 tablets by mouth daily.     buPROPion (WELLBUTRIN XL) 300 MG 24 hr tablet Take 1 tablet (300 mg total) by mouth daily. 30 tablet 3   busPIRone (BUSPAR) 10 MG tablet Take 1 tablet (10 mg total) by mouth 3 (three) times daily. 90 tablet 3   cetirizine (ZYRTEC ALLERGY) 10 MG tablet Take 1 tablet (10 mg total) by mouth daily. 30 tablet 11   fluticasone (FLONASE) 50 MCG/ACT nasal spray Place 2 sprays into both nostrils daily. 16 g 6   hydrOXYzine (ATARAX/VISTARIL) 10 MG tablet Take 0.5 tablets (5 mg total) by mouth 3 (three) times daily as needed. 45 tablet 3   levothyroxine (SYNTHROID) 150 MCG tablet Take 1 tablet (150 mcg total) by mouth daily. 30 tablet 6   lithium carbonate 300 MG capsule TAKE 1 CAPSULE BY MOUTH EVERY MORNING & TAKE 2 CAPSULES EVERY NIGHT AT BEDTIME 90 capsule 3   Multiple Vitamin (MULTIVITAMIN) capsule Take 2 capsules by mouth daily.      Multiple Vitamins-Minerals (ZINC PO) Take 2 tablets by mouth daily.     OVER THE COUNTER MEDICATION Take 2 tablets by mouth daily. Sea moss vitamin     Selenium Sulfide 2.25 % SHAM Use twice weekly 180 mL 1   benzonatate (TESSALON) 100 MG capsule Take 1 capsule (100 mg total) by mouth 2 (two) times daily as needed for cough. 20 capsule 0   doxycycline (VIBRAMYCIN) 100 MG capsule Take 1 capsule by mouth twice a day with  meals 10 capsule 0   nitrofurantoin, macrocrystal-monohydrate, (MACROBID) 100 MG capsule Take 1 capsule (100 mg total) by mouth 2 (two) times daily. 10 capsule 0   No facility-administered medications prior to visit.    Allergies reviewed  ROS Review of Systems  Constitutional:  Negative for chills and fever.  HENT:  Positive for congestion, sinus pressure and sore throat. Negative for ear pain.   Eyes: Negative.   Respiratory:  Positive for cough, shortness of breath and wheezing.   Cardiovascular:  Negative for chest pain.  Gastrointestinal:  Negative for diarrhea, nausea and  vomiting.  Endocrine: Negative.   Genitourinary: Negative.   Musculoskeletal: Negative.   Skin: Negative.   Allergic/Immunologic: Negative.   Neurological: Negative.   Hematological: Negative.   Psychiatric/Behavioral: Negative.       Objective:      LMP 11/29/2012  Wt Readings from Last 3 Encounters:  10/19/20 207 lb 9.6 oz (94.2 kg)  06/14/20 209 lb 6.4 oz (95 kg)  04/25/20 209 lb (94.8 kg)     Health Maintenance Due  Topic Date Due   Pneumococcal Vaccine 38-31 Years old (1 - PCV) Never done   COLONOSCOPY (Pts 45-47yr Insurance coverage will need to be confirmed)  Never done   COVID-19 Vaccine (4 - Booster for [place]) 05/19/2020    There are no preventive care reminders to display for this patient.  Lab Results  Component Value Date   TSH 0.107 (L) 01/26/2021   Lab Results  Component Value Date   WBC 8.3 03/18/2020   HGB 14.3 03/18/2020   HCT 42.9 03/18/2020   MCV 97.5 03/18/2020   PLT 168 03/18/2020   Lab Results  Component Value Date   NA 142 01/26/2021   K 4.3 01/26/2021   CO2 24 01/26/2021   GLUCOSE 91 01/26/2021   BUN 12 01/26/2021   CREATININE 0.71 01/26/2021   BILITOT 0.4 01/26/2021   ALKPHOS 77 01/26/2021   AST 14 01/26/2021   ALT 17 01/26/2021   PROT 6.5 01/26/2021   ALBUMIN 4.5 01/26/2021   CALCIUM 9.9 01/26/2021   ANIONGAP 8 03/18/2020   EGFR 105  01/26/2021   Lab Results  Component Value Date   CHOL 110 01/26/2021   Lab Results  Component Value Date   HDL 47 01/26/2021   Lab Results  Component Value Date   LDLCALC 49 01/26/2021   Lab Results  Component Value Date   TRIG 64 01/26/2021   Lab Results  Component Value Date   CHOLHDL 2.3 01/26/2021   Lab Results  Component Value Date   HGBA1C 5.1 04/25/2020      Assessment & Plan:   Problem List Items Addressed This Visit   None Visit Diagnoses     Acute upper respiratory infection    -  Primary   Relevant Medications   azithromycin (ZITHROMAX) 250 MG tablet   Acute cough       Relevant Medications   benzonatate (TESSALON) 100 MG capsule       Meds ordered this encounter  Medications   azithromycin (ZITHROMAX) 250 MG tablet    Sig: Take 2 tablets by mouth on day 1, then take 1 tablet by mouth once daily    Dispense:  6 tablet    Refill:  0    Order Specific Question:   Supervising Provider    Answer:   WElsie Stain[[id number]]   benzonatate (TESSALON) 100 MG capsule    Sig: Take 1 capsule (100 mg total) by mouth 2 (two) times daily as needed for cough.    Dispense:  20 capsule    Refill:  0    Order Specific Question:   Supervising Provider    Answer:   WElsie Stain[[id number]]   Assessment and Plan: 1. Acute upper respiratory infection Trial azithromycin, patient education given on supportive care, red flags given for prompt reevaluation. - azithromycin (ZITHROMAX) 250 MG tablet; Take 2 tablets by mouth on day 1, then take 1 tablet by mouth once daily  Dispense: 6 tablet; Refill: 0  2. Acute cough Trial  Tessalon Perles - benzonatate (TESSALON) 100 MG capsule; Take 1 capsule (100 mg total) by mouth 2 (two) times daily as needed for cough.  Dispense: 20 capsule; Refill: 0   Follow Up Instructions:    I discussed the assessment and treatment plan with the patient. The patient was provided an opportunity to ask questions and all were answered.  The patient agreed with the plan and demonstrated an understanding of the instructions.   The patient was advised to call back or seek an in-person evaluation if the symptoms worsen or if the condition fails to improve as anticipated.  I provided 21 minutes of non-face-to-face time during this encounter.    Follow-up: Return if symptoms worsen or fail to improve.    Loraine Grip Mayers, PA-C

## 2021-04-06 NOTE — Patient Instructions (Signed)
You will take azithromycin as directed and you can use the Tessalon Perles twice a day as needed.  I encourage you to stay well-hydrated and get plenty of rest.  I hope that you feel better soon, please let Kristen Moore know if there is anything else we can do for you.  Kennieth Rad, PA-C Physician Assistant [facility] [email or web address]   Upper Respiratory Infection, Adult An upper respiratory infection (URI) is a common viral infection of the nose, throat, and upper air passages that lead to the lungs. The most common type of URI is the common cold. URIs usually get better on their own, without medical treatment. What are the causes? A URI is caused by a virus. You may catch a virus by: Breathing in droplets from an infected person's cough or sneeze. Touching something that has been exposed to the virus (contaminated) and then touching your mouth, nose, or eyes. What increases the risk? You are more likely to get a URI if: You are very young or very old. It is autumn or winter. You have close contact with others, such as at a daycare, school, or health care facility. You smoke. You have long-term (chronic) heart or lung disease. You have a weakened disease-fighting (immune) system. You have nasal allergies or asthma. You are experiencing a lot of stress. You work in an area that has poor air circulation. You have poor nutrition. What are the signs or symptoms? A URI usually involves some of the following symptoms: Runny or stuffy (congested) nose. Sneezing. Cough. Sore throat. Headache. Fatigue. Fever. Loss of appetite. Pain in your forehead, behind your eyes, and over your cheekbones (sinus pain). Muscle aches. Redness or irritation of the eyes. Pressure in the ears or face. How is this diagnosed? This condition may be diagnosed based on your medical history and symptoms, and a physical exam. Your health care provider may  use a cotton swab to take a mucus sample from your nose (nasal swab). This sample can be tested to determine what virus is causing the illness. How is this treated? URIs usually get better on their own within 7-10 days. You can take steps at home to relieve your symptoms. Medicines cannot cure URIs, but your health care provider may recommend certain medicines to help relieve symptoms, such as: Over-the-counter cold medicines. Cough suppressants. Coughing is a type of defense against infection that helps to clear the respiratory system, so take these medicines only as recommended by your health care provider. Fever-reducing medicines. Follow these instructions at home: Activity Rest as needed. If you have a fever, stay home from work or school until your fever is gone or until your health care provider says you are no longer contagious. Your health care provider may have you wear a face mask to prevent your infection from spreading. Relieving symptoms Gargle with a salt-water mixture 3-4 times a day or as needed. To make a salt-water mixture, completely dissolve -1 tsp of salt in 1 cup of warm water. Use a cool-mist humidifier to add moisture to the air. This can help you breathe more easily. Eating and drinking  Drink enough fluid to keep your urine pale yellow. Eat soups and other clear broths. General instructions  Take over-the-counter and prescription medicines only as told by your health care provider. These include cold medicines, fever reducers, and cough suppressants. Do not use any products that contain nicotine or tobacco, such as cigarettes and e-cigarettes. If you need help quitting, ask your health care  provider. Stay away from secondhand smoke. Stay up to date on all immunizations, including the yearly (annual) flu vaccine. Keep all follow-up visits as told by your health care provider. This is important. How to prevent the spread of infection to others  URIs can be passed  from person to person (are contagious). To prevent the infection from spreading: Wash your hands often with soap and water. If soap and water are not available, use hand sanitizer. Avoid touching your mouth, face, eyes, or nose. Cough or sneeze into a tissue or your sleeve or elbow instead of into your hand or into the air. Contact a health care provider if: You are getting worse instead of better. You have a fever or chills. Your mucus is brown or red. You have yellow or brown discharge coming from your nose. You have pain in your face, especially when you bend forward. You have swollen neck glands. You have pain while swallowing. You have white areas in the back of your throat. Get help right away if: You have shortness of breath that gets worse. You have severe or persistent: Headache. Ear pain. Sinus pain. Chest pain. You have chronic lung disease along with any of the following: Wheezing. Prolonged cough. Coughing up blood. A change in your usual mucus. You have a stiff neck. You have changes in your: Vision. Hearing. Thinking. Mood. Summary An upper respiratory infection (URI) is a common infection of the nose, throat, and upper air passages that lead to the lungs. A URI is caused by a virus. URIs usually get better on their own within 7-10 days. Medicines cannot cure URIs, but your health care provider may recommend certain medicines to help relieve symptoms. This information is not intended to replace advice given to you by your health care provider. Make sure you discuss any questions you have with your health care provider. Document Revised: 01/28/2020 Document Reviewed: 01/28/2020 Elsevier Patient Education  [place].

## 2021-04-06 NOTE — Telephone Encounter (Signed)
Patient forgot to ask PCP for a Dr. Note excusing her from work until Monday 04/10/2021 due to the symptoms discuss at today virtual appointment. Patient stats okay to send Dr. Note through My Chart

## 2021-04-06 NOTE — Telephone Encounter (Signed)
Pt called in c/o wheezing and shortness of breath with activity on and off for a week.   She's also c/o chest tightness with breathing in and out that is hurting in her upper back too.   She's also c/o a bad sore throat and coughing up green mucus.    See triage notes.  I made her an appt with Carrolyn Meiers for today at 1:30 in office visit at [facility].    She has the [employer] for her care.   She said she can get a ride and be here on time for this appt as it's only 45 min from now.  I sent my notes to [facility].  Protocol is to be seen within 4 hours.

## 2021-04-06 NOTE — Telephone Encounter (Signed)
Reason for Disposition  [1] MILD difficulty breathing (e.g., minimal/no SOB at rest, SOB with walking, pulse <100) AND [2] NEW-onset or WORSE than normal  Answer Assessment - Initial Assessment Questions 1. RESPIRATORY STATUS: "Describe your breathing?" (e.g., wheezing, shortness of breath, unable to speak, severe coughing)      Pt calling in c/o wheezing.   A week ago I was having chest pain.   It hurt into my back.   Before that it hurt near my kidney.    My chest still hurts but not as bad as before.    I have a bad sore throat.   Not tested for Covid.    2. ONSET: "When did this breathing problem begin?"      A week or over.   3. PATTERN "Does the difficult breathing come and go, or has it been constant since it started?"      A little short of breath.   I'm running around to the store and I get out of breath.   It's intermittent until I'm moving around.  Wheezing is constant even at night.    I have a cough intermittently.   I have green mucus coming up.    Day before yesterday I called out of work.   I woke up I noticed white stuff coming from my right ear.   It was chunks of white stuff coming out.   No pain or pressure from the right ear.   I've never had that before. 4. SEVERITY: "How bad is your breathing?" (e.g., mild, moderate, severe)    - MILD: No SOB at rest, mild SOB with walking, speaks normally in sentences, can lie down, no retractions, pulse < 100.    - MODERATE: SOB at rest, SOB with minimal exertion and prefers to sit, cannot lie down flat, speaks in phrases, mild retractions, audible wheezing, pulse 100-120.    - SEVERE: Very SOB at rest, speaks in single words, struggling to breathe, sitting hunched forward, retractions, pulse > 120      Moderate with moving about.   My mother is dying of cancer now.    5. RECURRENT SYMPTOM: "Have you had difficulty breathing before?" If Yes, ask: "When was the last time?" and "What happened that time?"      I had asthma as a kid.     6.  CARDIAC HISTORY: "Do you have any history of heart disease?" (e.g., heart attack, angina, bypass surgery, angioplasty)      None 7. LUNG HISTORY: "Do you have any history of lung disease?"  (e.g., pulmonary embolus, asthma, emphysema)     None 8. CAUSE: "What do you think is causing the breathing problem?"      I smoke.    9. OTHER SYMPTOMS: "Do you have any other symptoms? (e.g., dizziness, runny nose, cough, chest pain, fever)     I've had the chest pain for a while and the wheezing.   I'm dizzy sometimes, not often.    I'm under a lot stress with my family. 10. O2 SATURATION MONITOR:  "Do you use an oxygen saturation monitor (pulse oximeter) at home?" If Yes, "What is your reading (oxygen level) today?" "What is your usual oxygen saturation reading?" (e.g., 95%)       My Mom has one I can use real quick.  I'm tight in my chest when I breath out.   The pain in my chest feels tight when I breath and tight into my neck.  Breathing in and out is tight.   My right hurts a little but I have a rotator cuff injury in my right shoulder.   I have a headache across my head. I'm having terrible flashes of heat and break out into a sweat.   I having body aches and a little chills.  No fever.   I sound congested in my nose.     No runny nose.   My throat is so sore.   My brother is sick coughing and has been around me.   He has the same symptoms I'm having.    11. PREGNANCY: "Is there any chance you are pregnant?" "When was your last menstrual period?"       No! 12. TRAVEL: "Have you traveled out of the country in the last month?" (e.g., travel history, exposures)       I've been exposed to my sick brother.  Protocols used: Breathing Difficulty-A-AH

## 2021-04-07 ENCOUNTER — Other Ambulatory Visit: Payer: Self-pay

## 2021-04-10 ENCOUNTER — Other Ambulatory Visit: Payer: Self-pay

## 2021-04-10 ENCOUNTER — Ambulatory Visit: Payer: Self-pay | Attending: Family Medicine

## 2021-04-13 ENCOUNTER — Other Ambulatory Visit: Payer: Self-pay

## 2021-04-13 ENCOUNTER — Emergency Department (HOSPITAL_COMMUNITY)
Admission: EM | Admit: 2021-04-13 | Discharge: 2021-04-13 | Disposition: A | Discharge status: Home / Self Care | Payer: No Typology Code available for payment source | Attending: Emergency Medicine | Admitting: Emergency Medicine

## 2021-04-13 ENCOUNTER — Encounter (HOSPITAL_COMMUNITY): Payer: Self-pay | Admitting: Oncology

## 2021-04-13 DIAGNOSIS — Z79899 Other long term (current) drug therapy: Secondary | ICD-10-CM | POA: Insufficient documentation

## 2021-04-13 DIAGNOSIS — F17210 Nicotine dependence, cigarettes, uncomplicated: Secondary | ICD-10-CM | POA: Insufficient documentation

## 2021-04-13 DIAGNOSIS — E039 Hypothyroidism, unspecified: Secondary | ICD-10-CM | POA: Insufficient documentation

## 2021-04-13 DIAGNOSIS — J45909 Unspecified asthma, uncomplicated: Secondary | ICD-10-CM | POA: Insufficient documentation

## 2021-04-13 DIAGNOSIS — Z91040 Latex allergy status: Secondary | ICD-10-CM | POA: Insufficient documentation

## 2021-04-13 DIAGNOSIS — R519 Headache, unspecified: Secondary | ICD-10-CM | POA: Insufficient documentation

## 2021-04-13 MED ORDER — VALACYCLOVIR HCL 1 G PO TABS
1000.0000 mg | ORAL_TABLET | Freq: Three times a day (TID) | ORAL | 0 refills | Status: AC
  Filled 2021-04-13: qty 21, 7d supply, fill #0

## 2021-04-13 NOTE — ED Provider Notes (Signed)
[facility] DEPT Provider Note   CSN: [id number] Arrival date & time: 04/13/21  1302     History Chief Complaint  Patient presents with   Headache    Kristen Moore is a 50 y.o. female.  HPI  50 year old female history of abnormal Pap smear, anemia, asthma, bipolar 1 disorder, breast cyst, dysfunctional uterine bleeding, fibroids, headache, hypothyroidism, IBS, thyroid dysfunction, who presents the emergency department today for evaluation of pain to her left scalp.  She states that this has been present for the last 4 days.  Her posterior left parietal scalp is painful to touch.  She denies any head trauma or LOC.  She has not had any fevers but states that she has had URI symptoms prior to the onset of her symptoms.  She denies any other associated neurologic complaints related to this.  Does not report any neck stiffness or pain  Past Medical History:  Diagnosis Date   Abnormal Pap smear    cryo   Anemia    Asthma    Bipolar 1 disorder (HCC)    Cyst of breast    left breast   DUB (dysfunctional uterine bleeding)    Fibroids    Headache(784.0)    Hypothyroidism    IBS (irritable bowel syndrome)    Thyroid dysfunction     Patient Active Problem List   Diagnosis Date Noted   Generalized anxiety disorder 02/02/2020   Screening breast examination 05/19/2019   Breast pain 05/19/2019   Bipolar depression ([place]) 05/07/2019   Elevated blood pressure reading in office without diagnosis of hypertension 08/20/2018   Hyperthyroidism 08/20/2018   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   Rotator cuff impingement syndrome 09/03/2016   Decreased vision in both eyes 06/05/2016   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   IBS (irritable bowel syndrome) 10/21/2015   Dyshidrotic hand dermatitis 10/21/2015   Dyshidrotic foot dermatitis 10/21/2015   Bright red blood per rectum 10/21/2015   S/P bilateral oophorectomy 10/21/2015   Hypothyroidism  10/21/2015   HLD (hyperlipidemia) 03/18/2015   Knee pain, right 12/13/2014   Pelvic pain 12/25/2012   CHEST PAIN UNSPECIFIED 11/05/2008   ELECTROCARDIOGRAM, ABNORMAL 11/05/2008    Past Surgical History:  Procedure Laterality Date   ABDOMINAL HYSTERECTOMY Bilateral 12/25/2012   Procedure: HYSTERECTOMY ABDOMINAL;  Surgeon: Emily Filbert, MD;  Location: [facility];  Service: Gynecology;  Laterality: Bilateral;  TAH and bilateral salpingectomy   BILATERAL SALPINGECTOMY Bilateral 12/25/2012   Procedure: BILATERAL SALPINGECTOMY;  Surgeon: Emily Filbert, MD;  Location: [facility];  Service: Gynecology;  Laterality: Bilateral;   CERVICAL BIOPSY  W/ LOOP ELECTRODE EXCISION     CHOLECYSTECTOMY     COLON SURGERY     DILATION AND CURETTAGE OF UTERUS     OOPHORECTOMY Bilateral 12/25/2012   Procedure: OOPHORECTOMY;  Surgeon: Emily Filbert, MD;  Location: [facility];  Service: Gynecology;  Laterality: Bilateral;   TUBAL LIGATION       OB History     Gravida  3   Para  2   Term  1   Preterm  1   AB  1   Living  1      SAB      IAB  1   Ectopic      Multiple      Live Births              Family History  Problem Relation Age of Onset   Heart disease Father  Cancer Father 39       lung   Hypertension Mother    Rheum arthritis Mother    Fibromyalgia Mother    COPD Mother    Sleep apnea Mother    Thyroid disease Mother    Breast cancer Maternal Grandmother    Breast cancer Paternal Aunt     Social History   Tobacco Use   Smoking status: Every Day    Packs/day: 0.50    Years: 9.00    Pack years: 4.50    Types: Cigarettes   Smokeless tobacco: Never  Vaping Use   Vaping Use: Never used  Substance Use Topics   Alcohol use: No    Alcohol/week: 0.0 standard drinks   Drug use: No    Home Medications Prior to Admission medications   Medication Sig Start Date End Date Taking? Authorizing Provider  valACYclovir (VALTREX) 1000 MG tablet Take 1 tablet (1,000 mg total) by mouth 3  (three) times daily for 7 days. 04/13/21 04/20/21 Yes Dezra Mandella S, PA-C  atorvastatin (LIPITOR) 40 MG tablet TAKE 1 TABLET (40 MG TOTAL) BY MOUTH DAILY. 03/25/21   Charlott Rakes, MD  azithromycin (ZITHROMAX) 250 MG tablet Take 2 tablets by mouth on day 1, then take 1 tablet by mouth once daily 04/06/21   Mayers, Cari S, PA-C  benzonatate (TESSALON) 100 MG capsule Take 1 capsule (100 mg total) by mouth 2 (two) times daily as needed for cough. 04/06/21   Mayers, Cari S, PA-C  BLACK COHOSH EXTRACT PO Take 2 tablets by mouth daily.    [provider]  buPROPion (WELLBUTRIN XL) 300 MG 24 hr tablet Take 1 tablet (300 mg total) by mouth daily. 03/07/21   Salley Slaughter, NP  busPIRone (BUSPAR) 10 MG tablet Take 1 tablet (10 mg total) by mouth 3 (three) times daily. 03/07/21   Salley Slaughter, NP  hydrOXYzine (ATARAX/VISTARIL) 10 MG tablet Take 0.5 tablets (5 mg total) by mouth 3 (three) times daily as needed. 03/07/21   Salley Slaughter, NP  levothyroxine (SYNTHROID) 150 MCG tablet Take 1 tablet (150 mcg total) by mouth daily. 01/27/21   Charlott Rakes, MD  lithium carbonate 300 MG capsule TAKE 1 CAPSULE BY MOUTH EVERY MORNING & TAKE 2 CAPSULES EVERY NIGHT AT BEDTIME 03/07/21   Eulis Canner E, NP  Multiple Vitamin (MULTIVITAMIN) capsule Take 2 capsules by mouth daily.     [provider]  Multiple Vitamins-Minerals (ZINC PO) Take 2 tablets by mouth daily.    [provider]  OVER THE COUNTER MEDICATION Take 2 tablets by mouth daily. Sea moss vitamin    [provider]  Selenium Sulfide 2.25 % SHAM Use twice weekly 10/19/20   Charlott Rakes, MD    Allergies    Latex, Penicillins, Bee venom, Omega-3, and Other  Review of Systems   Review of Systems  Constitutional:  Negative for fever.  Eyes:  Negative for visual disturbance.  Neurological:  Negative for dizziness, weakness, light-headedness and numbness.       Head pain   Physical Exam Updated  Vital Signs BP (!) 138/91 (BP Location: Left Arm)   Pulse 70   Temp 98.8 F (37.1 C) (Oral)   Resp 18   Ht 5\' 4"  (1.626 m)   Wt 93 kg   LMP 11/29/2012   SpO2 96%   BMI 35.19 kg/m   Physical Exam Vitals and nursing note reviewed.  Constitutional:      General: She is not in  acute distress.    Appearance: She is well-developed.  HENT:     Head: Normocephalic and atraumatic.     Comments: TTP to the left parietal scalp that reproduces pain. No erythema, induration or fluctuance noted. No rashes noted Eyes:     Conjunctiva/sclera: Conjunctivae normal.  Cardiovascular:     Rate and Rhythm: Normal rate.     Heart sounds: No murmur heard. Pulmonary:     Effort: Pulmonary effort is normal.  Abdominal:     Palpations: Abdomen is soft.  Musculoskeletal:     Cervical back: Neck supple.  Skin:    General: Skin is warm and dry.  Neurological:     Mental Status: She is alert.     Comments: Mental Status:  Alert, thought content appropriate, able to give a coherent history. Speech fluent without evidence of aphasia. Able to follow 2 step commands without difficulty.  Cranial Nerves:  II: pupils equal, round, reactive to light III,IV, VI: ptosis not present, extra-ocular motions intact bilaterally  V,VII: smile symmetric, facial light touch sensation equal VIII: hearing grossly normal to voice  X: uvula elevates symmetrically  XI: bilateral shoulder shrug symmetric and strong XII: midline tongue extension without fassiculations Motor:  Normal tone. 5/5 strength of BUE and BLE major muscle groups including strong and equal grip strength and dorsiflexion/plantar flexion Sensory: light touch normal in all extremities.     ED Results / Procedures / Treatments   Labs (all labs ordered are listed, but only abnormal results are displayed) Labs Reviewed - No data to display  EKG None  Radiology No results found.  Procedures Procedures   Medications Ordered in ED Medications  - No data to display  ED Course  I have reviewed the triage vital signs and the nursing notes.  Pertinent labs & imaging results that were available during my care of the patient were reviewed by me and considered in my medical decision making (see chart for details).    MDM Rules/Calculators/A&P                          50 year old female presenting for evaluation of pain to her scalp that has been present for the last 4 days.  The scalp is tender to touch.  She denies any trauma.  She has not had any associated neurologic complaints to suggest a neurologic cause of symptoms.  I doubt temporal arteritis.  There is no obvious bony abnormality on exam and no obvious evidence of an abscess or cellulitis.  I do not see any evidence of rashes but given her description of pain described as a burning sensation discussed the possibility that this could be an early presentation of shingles.  Will go ahead and treat her with anti-inflammatories as well as antivirals and have her follow-up with her PCP for reassessment in 1 week.  She is advised return to the ED for any new or worsening symptoms in the meantime.   Final Clinical Impression(s) / ED Diagnoses Final diagnoses:  Pain of scalp    Rx / DC Orders ED Discharge Orders          Ordered    valACYclovir (VALTREX) 1000 MG tablet  3 times daily        04/13/21 [address] Adrionna Delcid S, PA-C 04/13/21 1411    Davonna Belling, MD 04/14/21 [phone]

## 2021-04-13 NOTE — ED Triage Notes (Signed)
Pt reports feeling like she bumped her head on the left side for months. Pt states she did not bump her head.  Pt states pain in intermittent.

## 2021-04-13 NOTE — Discharge Instructions (Addendum)
Take acyclovir as directed for suspected early presentation of shingles  Please follow up with your primary care provider within 5-7 days for re-evaluation of your symptoms. If you do not have a primary care provider, information for a healthcare clinic has been provided for you to make arrangements for follow up care. Please return to the emergency department for any new or worsening symptoms.

## 2021-04-24 ENCOUNTER — Telehealth: Payer: Self-pay | Admitting: Family Medicine

## 2021-04-24 ENCOUNTER — Ambulatory Visit: Payer: No Typology Code available for payment source | Admitting: Family Medicine

## 2021-04-24 NOTE — Telephone Encounter (Signed)
Copied from [place] [phone]. Topic: General - Other >> Apr 24, 2021  8:16 AM Celene Kras wrote: Reason for CRM: Pt called stating that she is needing to have an extension on her financial aid packet as she is dealing with her sick mother. She is requesting to have a call back to discuss further. Please advise.

## 2021-04-24 NOTE — Telephone Encounter (Signed)
I return Pt call, she ask for an extension to bring the missing documents to complete her CAFA applications, she was given until new day 05/01/21, Pt understood

## 2021-04-25 ENCOUNTER — Other Ambulatory Visit: Payer: Self-pay

## 2021-05-02 ENCOUNTER — Other Ambulatory Visit: Payer: Self-pay

## 2021-05-02 ENCOUNTER — Telehealth: Payer: Self-pay | Admitting: Family Medicine

## 2021-05-02 NOTE — Telephone Encounter (Signed)
Pt was sent a letter from financial dept. Inform them, that the application they submitted was incomplete, since they were missing some documentation at the time of the appointment, Pt need to reschedule and resubmit all new papers and application for CAFA and [place], P.S. old documents has been sent back by mail to the Pt and Pt. need to make a new appt. 

## 2021-05-03 ENCOUNTER — Other Ambulatory Visit: Payer: Self-pay

## 2021-05-04 ENCOUNTER — Telehealth: Payer: Self-pay | Admitting: Family Medicine

## 2021-05-04 ENCOUNTER — Other Ambulatory Visit: Payer: Self-pay

## 2021-05-04 NOTE — Telephone Encounter (Signed)
I return Pt call, she was inform that her deadline is 05/08/21

## 2021-05-04 NOTE — Telephone Encounter (Signed)
Copied from [place] [id number]. Topic: General - Other >> May 01, 2021  1:41 PM Yvette Rack wrote: Reason for CRM: Pt stated she understands the deadline to turn in her paperwork for the orange card is today but her mother has cancer and she has not been able to get out to get it notarized. Pt stated her mother has an appt on 05/04/21 so she will be able to have it completed and notarized then. Cb# [phone]

## 2021-05-04 NOTE — Telephone Encounter (Signed)
Pt called back and was advised deadline is12/10/2020.  Pt verbalized understanding.

## 2021-05-11 ENCOUNTER — Emergency Department (HOSPITAL_COMMUNITY): Payer: No Typology Code available for payment source

## 2021-05-11 ENCOUNTER — Emergency Department (HOSPITAL_COMMUNITY)
Admission: EM | Admit: 2021-05-11 | Discharge: 2021-05-11 | Disposition: A | Discharge status: Home / Self Care | Payer: No Typology Code available for payment source | Attending: Student | Admitting: Student

## 2021-05-11 DIAGNOSIS — Z5321 Procedure and treatment not carried out due to patient leaving prior to being seen by health care provider: Secondary | ICD-10-CM | POA: Insufficient documentation

## 2021-05-11 DIAGNOSIS — S0181XA Laceration without foreign body of other part of head, initial encounter: Secondary | ICD-10-CM | POA: Insufficient documentation
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
## 2021-05-11 NOTE — ED Notes (Signed)
Called pt no answer found stickers by desk

## 2021-05-11 NOTE — ED Notes (Signed)
Pt name called again for updated vitals, no response
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
## 2021-05-11 NOTE — ED Notes (Signed)
Pt not answering for vitals.  

## 2021-05-11 NOTE — ED Notes (Signed)
Called for vitals no answer X1 

## 2021-05-19 ENCOUNTER — Ambulatory Visit (INDEPENDENT_AMBULATORY_CARE_PROVIDER_SITE_OTHER): Payer: No Payment, Other | Admitting: Licensed Clinical Social Worker

## 2021-05-19 DIAGNOSIS — F411 Generalized anxiety disorder: Secondary | ICD-10-CM
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
## 2021-05-26 ENCOUNTER — Other Ambulatory Visit: Payer: Self-pay

## 2021-05-26 ENCOUNTER — Encounter: Payer: Self-pay | Admitting: Family Medicine

## 2021-05-26 ENCOUNTER — Ambulatory Visit: Payer: Self-pay | Attending: Family Medicine | Admitting: Family Medicine

## 2021-05-26 VITALS — BP 130/86 | HR 72 | Ht 64.0 in | Wt 207.0 lb

## 2021-05-26 DIAGNOSIS — E039 Hypothyroidism, unspecified: Secondary | ICD-10-CM

## 2021-05-26 DIAGNOSIS — R42 Dizziness and giddiness: Secondary | ICD-10-CM

## 2021-05-26 DIAGNOSIS — R5383 Other fatigue: Secondary | ICD-10-CM

## 2021-05-26 DIAGNOSIS — Z1211 Encounter for screening for malignant neoplasm of colon: Secondary | ICD-10-CM

## 2021-05-26 MED ORDER — MECLIZINE HCL 25 MG PO TABS
25.0000 mg | ORAL_TABLET | Freq: Three times a day (TID) | ORAL | 0 refills | Status: DC | PRN
  Filled 2021-05-26: qty 30, 10d supply, fill #0

## 2021-05-26 NOTE — Progress Notes (Signed)
Blood pressure has been elevated.  Left the ED due to wait.
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
## 2021-05-26 NOTE — Patient Instructions (Signed)
Fatigue °If you have fatigue, you feel tired all the time and have a lack of energy or a lack of motivation. Fatigue may make it difficult to start or complete tasks because of exhaustion. In general, occasional or mild fatigue is often a normal response to activity or life. However, long-lasting (chronic) or extreme fatigue may be a symptom of a medical condition. °Follow these instructions at home: °General instructions °Watch your fatigue for any changes. °Go to bed and get up at the same time every day. °Avoid fatigue by pacing yourself during the day and getting enough sleep at night. °Maintain a healthy weight. °Medicines °Take over-the-counter and prescription medicines only as told by your health care provider. °Take a multivitamin, if told by your health care provider.  °Do not use herbal or dietary supplements unless they are approved by your health care provider. °Activity ° °Exercise regularly, as told by your health care provider. °Use or practice techniques to help you relax, such as yoga, tai chi, meditation, or massage therapy. °Eating and drinking ° °Avoid heavy meals in the evening. °Eat a well-balanced diet, which includes lean proteins, whole grains, plenty of fruits and vegetables, and low-fat dairy products. °Avoid consuming too much caffeine. °Avoid the use of alcohol. °Drink enough fluid to keep your urine pale yellow. °Lifestyle °Change situations that cause you stress. Try to keep your work and personal schedule in balance. °Do not use any products that contain nicotine or tobacco, such as cigarettes and e-cigarettes. If you need help quitting, ask your health care provider. °Do not use drugs. °Contact a health care provider if: °Your fatigue does not get better. °You have a fever. °You suddenly lose or gain weight. °You have headaches. °You have trouble falling asleep or sleeping through the night. °You feel angry, guilty, anxious, or sad. °You are unable to have a bowel movement  (constipation). °Your skin is dry. °You have swelling in your legs or another part of your body. °Get help right away if: °You feel confused. °Your vision is blurry. °You feel faint or you pass out. °You have a severe headache. °You have severe pain in your abdomen, your back, or the area between your waist and hips (pelvis). °You have chest pain, shortness of breath, or an irregular or fast heartbeat. °You are unable to urinate, or you urinate less than normal. °You have abnormal bleeding, such as bleeding from the rectum, vagina, nose, lungs, or nipples. °You vomit blood. °You have thoughts about hurting yourself or others. °If you ever feel like you may hurt yourself or others, or have thoughts about taking your own life, get help right away. You can go to your nearest emergency department or call: °Your local emergency services (911 in the [place] °A suicide crisis helpline, such as the National Suicide Prevention Lifeline at 1-800-273-8255 or 988 in the [place] This is open 24 hours a day. °Summary °If you have fatigue, you feel tired all the time and have a lack of energy or a lack of motivation. °Fatigue may make it difficult to start or complete tasks because of exhaustion. °Long-lasting (chronic) or extreme fatigue may be a symptom of a medical condition. °Exercise regularly, as told by your health care provider. °Change situations that cause you stress. Try to keep your work and personal schedule in balance. °This information is not intended to replace advice given to you by your health care provider. Make sure you discuss any questions you have with your health care provider. °Document Revised:   12/14/2020 Document Reviewed: 03/31/2020 °Elsevier Patient Education © 2022 Elsevier Inc. ° °

## 2021-05-27 ENCOUNTER — Encounter: Payer: Self-pay | Admitting: Family Medicine

## 2021-05-27 LAB — T4, FREE: Free T4: 1.71 ng/dL (ref 0.82–1.77)

## 2021-05-27 LAB — CBC WITH DIFFERENTIAL/PLATELET
Basophils Absolute: 0 10*3/uL (ref 0.0–0.2)
Basos: 1 %
EOS (ABSOLUTE): 0.1 10*3/uL (ref 0.0–0.4)
Eos: 2 %
Hematocrit: 44.9 % (ref 34.0–46.6)
Hemoglobin: 15 g/dL (ref 11.1–15.9)
Immature Grans (Abs): 0 10*3/uL (ref 0.0–0.1)
Immature Granulocytes: 0 %
Lymphocytes Absolute: 1.3 10*3/uL (ref 0.7–3.1)
Lymphs: 24 %
MCH: 31.3 pg (ref 26.6–33.0)
MCHC: 33.4 g/dL (ref 31.5–35.7)
MCV: 94 fL (ref 79–97)
Monocytes Absolute: 0.4 10*3/uL (ref 0.1–0.9)
Monocytes: 6 %
Neutrophils Absolute: 3.8 10*3/uL (ref 1.4–7.0)
Neutrophils: 67 %
Platelets: 200 10*3/uL (ref 150–450)
RBC: 4.79 x10E6/uL (ref 3.77–5.28)
RDW: 11.9 % (ref 11.7–15.4)
WBC: 5.6 10*3/uL (ref 3.4–10.8)

## 2021-05-27 LAB — VITAMIN D 25 HYDROXY (VIT D DEFICIENCY, FRACTURES): Vit D, 25-Hydroxy: 41.9 ng/mL (ref 30.0–100.0)

## 2021-05-27 LAB — TSH: TSH: 1.5 u[IU]/mL (ref 0.450–4.500)

## 2021-05-31 ENCOUNTER — Telehealth: Payer: Self-pay | Admitting: Family Medicine

## 2021-05-31 NOTE — Telephone Encounter (Signed)
PCP has responded to patient via mychart

## 2021-05-31 NOTE — Telephone Encounter (Signed)
Copied from [place] [id number]. Topic: General - Other >> May 30, 2021  1:57 PM Tessa Lerner A wrote: Reason for CRM: The patient would like to be contacted by a member of clinical staff when possible  The patient has additional concerns related to their Vitamin D and TSH levels  Please contact further when available

## 2021-06-01 ENCOUNTER — Other Ambulatory Visit: Payer: Self-pay

## 2021-06-02 ENCOUNTER — Inpatient Hospital Stay: Payer: No Typology Code available for payment source | Admitting: Family Medicine

## 2021-06-02 ENCOUNTER — Other Ambulatory Visit: Payer: Self-pay

## 2021-06-05 ENCOUNTER — Other Ambulatory Visit: Payer: Self-pay

## 2021-06-05 LAB — FECAL OCCULT BLOOD, IMMUNOCHEMICAL: Fecal Occult Bld: NEGATIVE

## 2021-06-06 ENCOUNTER — Telehealth (INDEPENDENT_AMBULATORY_CARE_PROVIDER_SITE_OTHER): Payer: No Payment, Other | Admitting: Psychiatry

## 2021-06-06 ENCOUNTER — Other Ambulatory Visit: Payer: Self-pay

## 2021-06-06 ENCOUNTER — Encounter (HOSPITAL_COMMUNITY): Payer: Self-pay | Admitting: Psychiatry

## 2021-06-06 DIAGNOSIS — E039 Hypothyroidism, unspecified: Secondary | ICD-10-CM | POA: Diagnosis not present

## 2021-06-06 DIAGNOSIS — F319 Bipolar disorder, unspecified: Secondary | ICD-10-CM | POA: Diagnosis not present

## 2021-06-06 DIAGNOSIS — F411 Generalized anxiety disorder: Secondary | ICD-10-CM

## 2021-06-06 MED ORDER — BUSPIRONE HCL 10 MG PO TABS
10.0000 mg | ORAL_TABLET | Freq: Three times a day (TID) | ORAL | 3 refills | Status: DC
  Filled 2021-06-06: qty 90, 30d supply, fill #0

## 2021-06-06 MED ORDER — BUPROPION HCL ER (XL) 300 MG PO TB24
300.0000 mg | ORAL_TABLET | Freq: Every day | ORAL | 3 refills | Status: DC
  Filled 2021-06-06 – 2021-06-28 (×3): qty 30, 30d supply, fill #0
  Filled 2021-07-31: qty 30, 30d supply, fill #1
  Filled 2021-09-05: qty 30, 30d supply, fill #2
  Filled 2021-10-02: qty 30, 30d supply, fill #3

## 2021-06-06 MED ORDER — LEVOTHYROXINE SODIUM 150 MCG PO TABS
150.0000 ug | ORAL_TABLET | Freq: Every day | ORAL | 6 refills | Status: DC
  Filled 2021-06-06 – 2021-06-28 (×3): qty 30, 30d supply, fill #0
  Filled 2021-07-31: qty 30, 30d supply, fill #1
  Filled 2021-08-31 (×2): qty 30, 30d supply, fill #2
  Filled 2021-10-02: qty 30, 30d supply, fill #3
  Filled 2021-11-01: qty 30, 30d supply, fill #4
  Filled 2021-11-29: qty 30, 30d supply, fill #5

## 2021-06-06 MED ORDER — LITHIUM CARBONATE 300 MG PO CAPS
ORAL_CAPSULE | ORAL | 3 refills | Status: DC
  Filled 2021-06-06: qty 90, fill #0
  Filled 2021-07-04: qty 90, 30d supply, fill #0
  Filled 2021-07-04: qty 90, fill #0
  Filled 2021-08-04: qty 90, 30d supply, fill #1
  Filled 2021-08-31: qty 90, 30d supply, fill #2
  Filled 2021-10-02: qty 90, 30d supply, fill #3
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
## 2021-06-20 ENCOUNTER — Other Ambulatory Visit: Payer: Self-pay

## 2021-06-20 ENCOUNTER — Ambulatory Visit: Payer: Self-pay | Admitting: *Deleted

## 2021-06-20 VITALS — BP 128/80 | Wt 208.9 lb

## 2021-06-20 DIAGNOSIS — N644 Mastodynia: Secondary | ICD-10-CM

## 2021-06-20 DIAGNOSIS — Z1239 Encounter for other screening for malignant neoplasm of breast: Secondary | ICD-10-CM

## 2021-06-20 NOTE — Patient Instructions (Addendum)
Explained breast self awareness with [name]. Patient did not need a Pap smear today due to patient has a history of a hysterectomy for benign reasons. Let her know that she doesn't need any further Pap smears due to her history of a hysterectomy for benign reasons. Referred patient to the [facility] for a diagnostic mammogram per recommendation. Appointment scheduled Wednesday, June 21, 2021 at 1350. Patient aware of appointment and will be there. Discussed smoking cessation with patient. Referred patient to the [facility] Quitline and gave resources to free smoking cessation classes at [facility].[name] verbalized understanding.  [name], Arvil [place], RN 2:30 PM

## 2021-06-20 NOTE — Progress Notes (Signed)
Kristen Moore is a 51 y.o. female who presents to [facility] clinic today with complaint of right breast mass x 1 year and 3 months that is painful. Patient states the pain comes and goes and is worse when touched. Patient rates the pain at a 6-7 out of 10 when touched and 3-4 out of 10 other times.     Pap Smear: Pap smear not completed today. Last Pap smear was 06/06/2011 at [facility] and normal. Per patient has a history of an abnormal Pap smear in 1992 that required a LEEP for follow-up. Per patient all Pap smears have been normal since LEEP. Patient has a history of a hysterectomy 12/25/2012 due to fibroids. Patient no longer needs Pap smears due to her history of a hysterectomy for benign reasons per BCCCP and ASCCP guidelines. Last Pap smear result is available in EPIC.   Physical exam: Breasts Breasts symmetrical. No skin abnormalities bilateral breasts. No nipple retraction bilateral breasts. No nipple discharge bilateral breasts. No lymphadenopathy. No lumps palpated bilateral breasts. Unable to palpate a lump in patients area of concern within the right breast. Complaints of right upper inner quadrant pain on exam consistent with previous exam 06/14/2020.     MM DIAG BREAST TOMO UNI RIGHT  Result Date: 12/14/2020 CLINICAL DATA:  First six-month follow-up for probably benign fibrocystic changes in the RIGHT breast. Patient notes tenderness in the UPPER portion of the RIGHT breast. She feels a lump in the 1 o'clock location. EXAM: DIGITAL DIAGNOSTIC UNILATERAL RIGHT MAMMOGRAM WITH TOMOSYNTHESIS AND CAD; ULTRASOUND RIGHT BREAST LIMITED TECHNIQUE: Right digital diagnostic mammography and breast tomosynthesis was performed. The images were evaluated with computer-aided detection.; Targeted ultrasound examination of the right breast was performed COMPARISON:  Previous exam(s). ACR Breast Density Category b: There are scattered areas of fibroglandular density. FINDINGS: No suspicious mass,  distortion, or microcalcifications are identified to suggest presence of malignancy. On physical exam, I palpate soft nonfocal thickening in the 12-1 o'clock location of the RIGHT breast, in the area of patient's concern. I palpate no discrete mass in this region. Targeted ultrasound is performed, showing a group of microcysts in the 12 o'clock location of the RIGHT breast 3 centimeters from the nipple measuring 0.7 x 0.2 x 0.6 centimeters. In the area of patient's concern in the 12-1 o'clock location, normal breast tissue is imaged otherwise. IMPRESSION: Stable appearance of probable fibrocystic changes in the RIGHT breast. No mammographic or ultrasound evidence for malignancy. RECOMMENDATION: Recommend bilateral diagnostic mammogram and RIGHT breast ultrasound in 6 months. I have discussed the findings and recommendations with the patient. If applicable, a reminder letter will be sent to the patient regarding the next appointment. BI-RADS CATEGORY  3: Probably benign. Electronically Signed   By: Nolon Nations M.D.   On: 12/14/2020 14:52  MM DIAG BREAST TOMO BILATERAL  Result Date: 06/14/2020 CLINICAL DATA:  Patient presents for palpable abnormality within the right breast. EXAM: DIGITAL DIAGNOSTIC BILATERAL MAMMOGRAM WITH CAD AND TOMO ULTRASOUND BILATERAL BREAST COMPARISON:  Previous exam(s). ACR Breast Density Category c: The breast tissue is heterogeneously dense, which may obscure small masses. FINDINGS: No suspicious mass identified underlying the palpable marker within the right breast. Within the outer left breast middle depth there is a new oval circumscribed mass. No additional concerning masses, calcifications or distortion identified within either breast. Mammographic images were processed with CAD. On physical exam, dense tissue is palpated within the right breast. Targeted ultrasound is performed, showing a 7 x 7 x  3 mm probable cluster of cysts right breast 12 o'clock position 3 cm from nipple  at the site of palpable concern. Within the left breast 3:30 o'clock 6 cm from nipple there is a 6 x 4 x 7 mm cyst with thin internal septation. IMPRESSION: Probably benign mass right breast 12 o'clock position favored to represent a cluster of cysts. RECOMMENDATION: Right breast diagnostic mammogram and ultrasound in 6 months to reassess probably benign right breast mass favored to represent a cluster of cysts. I have discussed the findings and recommendations with the patient. If applicable, a reminder letter will be sent to the patient regarding the next appointment. BI-RADS CATEGORY  3: Probably benign. Electronically Signed   By: Lovey Newcomer M.D.   On: 06/14/2020 11:13   MS DIGITAL DIAG TOMO BILAT  Result Date: 05/26/2019 CLINICAL DATA:  Patient complains of diffuse bilateral breast pain. EXAM: DIGITAL DIAGNOSTIC BILATERAL MAMMOGRAM WITH CAD AND TOMO COMPARISON:  Previous exam(s). ACR Breast Density Category b: There are scattered areas of fibroglandular density. FINDINGS: No suspicious mass, malignant type microcalcifications or distortion detected in either breast. Mammographic images were processed with CAD. IMPRESSION: No evidence of malignancy in either breast. RECOMMENDATION: Bilateral screening mammogram in 1 year is recommended. I have discussed the findings and recommendations with the patient. If applicable, a reminder letter will be sent to the patient regarding the next appointment. BI-RADS CATEGORY  1: Negative. Electronically Signed   By: Lillia Mountain M.D.   On: 05/26/2019 13:34   MS DIGITAL DIAG TOMO BILAT  Result Date: 04/22/2018 CLINICAL DATA:  Patient describes pain within the upper-outer quadrant of the LEFT breast. EXAM: DIGITAL DIAGNOSTIC BILATERAL MAMMOGRAM WITH CAD AND TOMO ULTRASOUND LEFT BREAST COMPARISON:  Previous exam(s). ACR Breast Density Category b: There are scattered areas of fibroglandular density. FINDINGS: Bilateral CC and MLO views were obtained today, with  additional 3D tomosynthesis, and with additional spot compression view of the upper outer LEFT breast corresponding to the area of clinical concern, with overlying skin marker in place. There are no new dominant masses, suspicious calcifications or secondary signs of malignancy within either breast. Specifically, there is no mammographic abnormality within the upper outer quadrant of the LEFT breast corresponding to the area of clinical concern. Mammographic images were processed with CAD. Targeted ultrasound is performed, evaluating the upper-outer quadrant of the LEFT breast as directed by the patient, showing only normal fibroglandular tissues and fat lobules. No solid or cystic mass. IMPRESSION: No evidence of malignancy within either breast. RECOMMENDATION: 1.  Screening mammogram in one year.(Code:[id number]-B-01Y) 2. Benign causes of breast pain, and possible remedies, were discussed with the patient. I have discussed the findings and recommendations with the patient. Results were also provided in writing at the conclusion of the visit. If applicable, a reminder letter will be sent to the patient regarding the next appointment. BI-RADS CATEGORY  1: Negative. Electronically Signed   By: Franki Cabot M.D.   On: 04/22/2018 13:39     Pelvic/Bimanual Pap is not indicated today per BCCCP guidelines.   Smoking History: Patient is a current smoker. Discussed smoking cessation with patient. Referred patient to the [facility] Quitline and gave resources to free smoking cessation classes at [facility].   Patient Navigation: Patient education provided. Access to services provided for patient through [facility] program.   Colorectal Cancer Screening: Per patient had a colonoscopy completed around 15 years ago. Patient completed a FIT Test 05/31/2021 that was negative. No complaints today.    Breast  and Cervical Cancer Risk Assessment: Patient has a family history of her maternal grandmother and a paternal aunt having breast  cancer. Patient has a history of her mother having ovarian cancer. Patient has no known genetic mutations or history of radiation treatment to the chest before age 89. Per patient has a history of cervical dysplasia. Patient has no history of being immunocompromised or DES exposure in-utero.  Risk Assessment     Risk Scores       06/20/2021 06/14/2020   Last edited by: Demetrius Revel, LPN McGill, Sherie Mamie Nick, LPN   5-year risk: 0.7 % 0.7 %   Lifetime risk: 7.4 % 7.5 %            A: BCCCP exam without pap smear Complaint of right breast mass and pain.  P: Referred patient to the [facility] for a diagnostic mammogram per recommendation. Appointment scheduled Wednesday, June 21, 2021 at 1350.  Loletta Parish, RN 06/20/2021 2:30 PM

## 2021-06-21 ENCOUNTER — Ambulatory Visit
Admission: RE | Admit: 2021-06-21 | Discharge: 2021-06-21 | Disposition: A | Discharge status: Home / Self Care | Payer: No Typology Code available for payment source | Source: Ambulatory Visit | Attending: Obstetrics and Gynecology | Admitting: Obstetrics and Gynecology

## 2021-06-21 DIAGNOSIS — N6310 Unspecified lump in the right breast, unspecified quadrant: Secondary | ICD-10-CM

## 2021-06-23 ENCOUNTER — Telehealth: Payer: Self-pay | Admitting: Family Medicine

## 2021-06-23 ENCOUNTER — Ambulatory Visit (INDEPENDENT_AMBULATORY_CARE_PROVIDER_SITE_OTHER): Payer: No Payment, Other | Admitting: Licensed Clinical Social Worker

## 2021-06-23 DIAGNOSIS — F411 Generalized anxiety disorder: Secondary | ICD-10-CM | POA: Diagnosis not present

## 2021-06-23 DIAGNOSIS — F331 Major depressive disorder, recurrent, moderate: Secondary | ICD-10-CM

## 2021-06-23 NOTE — Progress Notes (Signed)
THERAPIST PROGRESS NOTE  Virtual Visit via Video Note  I connected with Kristen Moore on 06/23/21 at 11:00 AM EST by a video enabled telemedicine application and verified that I am speaking with the correct person using two identifiers.  Location: Patient: Home Provider: Home   I discussed the limitations of evaluation and management by telemedicine and the availability of in person appointments. The patient expressed understanding and agreed to proceed. I discussed the assessment and treatment plan with the patient. The patient was provided an opportunity to ask questions and all were answered. The patient agreed with the plan and demonstrated an understanding of the instructions.   The patient was advised to call back or seek an in-person evaluation if the symptoms worsen or if the condition fails to improve as anticipated.  I provided 41 minutes of non-face-to-face time during this encounter.  Participation Level: Active  Behavioral Response: CasualAlertAnxious and Depressed  Type of Therapy: Individual Therapy  Treatment Goals addressed: Communication: Anx/Dep/Coping  Interventions: Solution Focused and Supportive  Summary: Kristen Moore is a 51 y.o. female who presents with hx of dep/anx.  Today patient logs on for video session at the scheduled time.  Patient is once again in the attic of her mother's home which is her individual space.  LCSW assessed for status of patient's daughter being in or out jail at the present time. [name] states her daughter is out of jail at the present time and her next court date is February 28.  Assessment reveals patient has continued to allow her daughter to come over to the house at will.  She states she has allowed her to come in when dtr says she is cold and hungry. Gives her clean clothes, etc.  Patient denies giving her any money but continues to allow into the home.  Patient states intent to go to court on February 28 to hold  her accountable for the assault.  LCSW assessed for patient's follow-up at the family [place] for support.  She states she did not go to the [facility] but may still do that before the court date in February.  LCSW provided additional education about the [facility] and its location.  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  Patient states she has secured a job at the [employer] she can walk to.  She states she is making more money at $13 an hour.  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  Patient states currently her shift is 3 PM to 8 PM most days.  Patient reports it being an adjustment to work this many hours since she got such few hours where she worked before.  LCSW congratulated patient on new job and provided education on successful adjustment to new job.  Patient reports her mother continues to slowly decline.  She advises mother still does not have palliative care.  She states mother has a Dr appointment this afternoon but Shera will not be able to go with her due to her work schedule.  Explored ways patient can follow-up as needed with mother's medical provider.  LCSW assessed for patient's follow-up mammogram.  Patient states she was told she has cluster cysts and is "stable".  Patient brings up wanting a biopsy and states intent to call PCP to ask for a biopsy order.  Patient reports she is taking her medications as  prescribed although continues to struggle with racing thoughts, worry and sad feelings about many of her life circumstances/ongoing stressors.  Patient reports her granddaughter is in rehab because of her poor developmental state which is yet another stressor.  LCSW provided emotional support.  Addressed coping. LCSW reviewed poc including scheduling prior to close of session. Pt states appreciation for care.   Suicidal/Homicidal: Nowithout intent/plan  Therapist  Response: Pt mostly receptive to care.  Plan: Return again in 4 weeks.  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

## 2021-06-23 NOTE — Telephone Encounter (Signed)
Copied from [place] [id number]. Topic: General - Other >> Jun 23, 2021 11:53 AM Kristen Moore A wrote: Reason for CRM: The patient has called to request orders for a biopsy of their breasts   The patient has concerns with their right breast particularly   Please contact further when possible

## 2021-06-23 NOTE — Telephone Encounter (Signed)
Call placed to patient and VM was left informing patient to return phone call. 

## 2021-06-28 ENCOUNTER — Other Ambulatory Visit: Payer: Self-pay

## 2021-06-28 ENCOUNTER — Telehealth: Payer: Self-pay | Admitting: Family Medicine

## 2021-06-28 DIAGNOSIS — N6001 Solitary cyst of right breast: Secondary | ICD-10-CM

## 2021-06-28 NOTE — Addendum Note (Signed)
Addended byCharlott Rakes on: 06/28/2021 01:15 PM   Modules accepted: Orders

## 2021-06-28 NOTE — Telephone Encounter (Signed)
I have placed a referral to the breast surgeon

## 2021-06-28 NOTE — Telephone Encounter (Signed)
Copied from [place] [id number]. Topic: General - Inquiry >> Jun 27, 2021 10:16 AM Valere Dross wrote: Reason for CRM: Pt called in stating she recently got a Mammo and was not satisfied with the results and wanted to get a second opinion at another office or possibly a biopsy, pt requested a call back, please advise.

## 2021-06-28 NOTE — Telephone Encounter (Signed)
Routing to PCP for review. Pt is concerned about er right breast.

## 2021-06-30 NOTE — Telephone Encounter (Signed)
Pt was called and informed of referral being placed. ?

## 2021-07-04 ENCOUNTER — Other Ambulatory Visit: Payer: Self-pay

## 2021-07-06 ENCOUNTER — Other Ambulatory Visit: Payer: Self-pay

## 2021-07-28 ENCOUNTER — Ambulatory Visit (INDEPENDENT_AMBULATORY_CARE_PROVIDER_SITE_OTHER): Payer: No Payment, Other | Admitting: Licensed Clinical Social Worker

## 2021-07-28 DIAGNOSIS — F411 Generalized anxiety disorder: Secondary | ICD-10-CM | POA: Diagnosis not present

## 2021-07-31 ENCOUNTER — Telehealth: Payer: Self-pay

## 2021-07-31 ENCOUNTER — Other Ambulatory Visit: Payer: Self-pay

## 2021-07-31 ENCOUNTER — Other Ambulatory Visit: Payer: Self-pay | Admitting: Family Medicine

## 2021-07-31 DIAGNOSIS — N6001 Solitary cyst of right breast: Secondary | ICD-10-CM

## 2021-07-31 DIAGNOSIS — E7800 Pure hypercholesterolemia, unspecified: Secondary | ICD-10-CM

## 2021-07-31 MED ORDER — ATORVASTATIN CALCIUM 40 MG PO TABS
40.0000 mg | ORAL_TABLET | Freq: Every day | ORAL | 3 refills | Status: DC
  Filled 2021-07-31: qty 30, 30d supply, fill #0
  Filled 2021-08-31: qty 30, 30d supply, fill #1
  Filled 2021-10-02: qty 30, 30d supply, fill #2
  Filled 2021-11-01: qty 30, 30d supply, fill #3

## 2021-07-31 NOTE — Telephone Encounter (Signed)
Can we reopen this referral for this patient.     Copied from [place] [id number]. Topic: Referral - Request for Referral >> Jul 28, 2021  2:28 PM Alanda Slim E wrote: Has patient seen PCP for this complaint? Yes  *If NO, is insurance requiring patient see PCP for this issue before PCP can refer them? Referral for which specialty:  Preferred provider/office: [facility] / Dr. Renard Hamper for biopsy  Reason for referral: Now that the pt has an range card/ she can go to appt but they need a new referral / pts appt is now on 3.14.23/ pease advise

## 2021-08-01 ENCOUNTER — Other Ambulatory Visit: Payer: Self-pay

## 2021-08-02 ENCOUNTER — Ambulatory Visit: Payer: Self-pay

## 2021-08-02 NOTE — Telephone Encounter (Signed)
Patient is scheduled   

## 2021-08-02 NOTE — Telephone Encounter (Signed)
Pt believes she has a kidney infection/ please advise / no appts available  ? ? ?Chief Complaint: Left flank pain, urinary pain ?Symptoms: Above ?Frequency: Started 1 week ago ?Pertinent Negatives: Patient denies fever ?Disposition: [] ED /[] Urgent Care (no appt availability in office) / [] Appointment(In office/virtual)/ []  [facility]/ [] Home Care/ [] Refused Recommended Disposition /[]  Mobile Bus/ []  Follow-up with PCP ?Additional Notes: Pt. Cannot come in today. Asking for appointment tomorrow. Please advise.  ?Answer Assessment - Initial Assessment Questions ?1. LOCATION: "Where does it hurt?" (e.g., left, right) ?    Left ?2. ONSET: "When did the pain start?" ?    Started 1 week ago ?3. SEVERITY: "How bad is the pain?" (e.g., Scale 1-10; mild, moderate, or severe) ?  - MILD (1-3): doesn't interfere with normal activities  ?  - MODERATE (4-7): interferes with normal activities or awakens from sleep  ?  - SEVERE (8-10): excruciating pain and patient unable to do normal activities (stays in bed)   ?    9 ?4. PATTERN: "Does the pain come and go, or is it constant?"  ?    Comes and goes ?5. CAUSE: "What do you think is causing the pain?" ?    Kidney ?6. OTHER SYMPTOMS:  "Do you have any other symptoms?" (e.g., fever, abdominal pain, vomiting, leg weakness, burning with urination, blood in urine) ?    Pain, pain with urination ?7. PREGNANCY:  "Is there any chance you are pregnant?" "When was your last menstrual period?" ?    No ? ?Protocols used: Flank Pain-A-AH ? ?

## 2021-08-02 NOTE — Telephone Encounter (Signed)
Pt called for referral update / she has an appt at [facility] is 3.14.23 / please advise  ?

## 2021-08-03 ENCOUNTER — Encounter: Payer: Self-pay | Admitting: Nurse Practitioner

## 2021-08-03 ENCOUNTER — Other Ambulatory Visit: Payer: Self-pay

## 2021-08-03 ENCOUNTER — Ambulatory Visit (INDEPENDENT_AMBULATORY_CARE_PROVIDER_SITE_OTHER): Payer: Self-pay | Admitting: Nurse Practitioner

## 2021-08-03 VITALS — BP 126/79 | HR 62 | Resp 18 | Wt 204.0 lb

## 2021-08-03 DIAGNOSIS — R350 Frequency of micturition: Secondary | ICD-10-CM

## 2021-08-03 DIAGNOSIS — N390 Urinary tract infection, site not specified: Secondary | ICD-10-CM

## 2021-08-03 LAB — POCT URINALYSIS DIP (CLINITEK)
Bilirubin, UA: NEGATIVE
Blood, UA: NEGATIVE
Glucose, UA: NEGATIVE mg/dL
Ketones, POC UA: NEGATIVE mg/dL
Nitrite, UA: NEGATIVE
POC PROTEIN,UA: NEGATIVE
Spec Grav, UA: 1.02 (ref 1.010–1.025)
Urobilinogen, UA: 0.2 E.U./dL
pH, UA: 7 (ref 5.0–8.0)

## 2021-08-03 MED ORDER — SULFAMETHOXAZOLE-TRIMETHOPRIM 800-160 MG PO TABS
1.0000 | ORAL_TABLET | Freq: Two times a day (BID) | ORAL | 0 refills | Status: DC
  Filled 2021-08-03: qty 14, 7d supply, fill #0

## 2021-08-03 NOTE — Telephone Encounter (Signed)
Pt was called and informed of referral being placed. ?

## 2021-08-03 NOTE — Progress Notes (Signed)
ITG:PQDIYM, Charlane Ferretti, MD Chief Complaint  Patient presents with   Urinary Frequency    Current Issues:  Presents with over 14 days of dysuria, urinary urgency, and urinary frequency Associated symptoms include:  dysuria, flank pain on the left, urinary frequency, and urinary urgency  There is a previous history of of similar symptoms.  Denies f/c/s, n/v/d, hemoptysis, PND, chest pain or edema.     Prior to Admission medications   Medication Sig Start Date End Date Taking? Authorizing Provider  atorvastatin (LIPITOR) 40 MG tablet TAKE 1 TABLET (40 MG TOTAL) BY MOUTH DAILY. 07/31/21  Yes Newlin, Charlane Ferretti, MD  buPROPion (WELLBUTRIN XL) 300 MG 24 hr tablet Take 1 tablet (300 mg total) by mouth daily. 06/06/21  Yes Eulis Canner E, NP  hydrOXYzine (ATARAX) 10 MG tablet Take 0.5 tablets (5 mg total) by mouth 3 (three) times daily as needed. 03/07/21  Yes Eulis Canner E, NP  levothyroxine (SYNTHROID) 150 MCG tablet Take 1 tablet (150 mcg total) by mouth daily. 06/06/21  Yes Eulis Canner E, NP  lithium carbonate 300 MG capsule TAKE 1 CAPSULE BY MOUTH EVERY MORNING & TAKE 2 CAPSULES EVERY NIGHT AT BEDTIME 06/06/21  Yes Eulis Canner E, NP  busPIRone (BUSPAR) 10 MG tablet Take 1 tablet (10 mg total) by mouth 3 (three) times daily. Patient not taking: Reported on 08/03/2021 06/06/21   Salley Slaughter, NP  meclizine (ANTIVERT) 25 MG tablet Take 1 tablet (25 mg total) by mouth 3 (three) times daily as needed for dizziness. Patient not taking: Reported on 08/03/2021 05/26/21   Charlott Rakes, MD    Vitals with BMI 08/03/2021 06/20/2021 05/26/2021  Height - - 5\' 4"   Weight 204 lbs 208 lbs 14 oz 207 lbs  BMI - - 41.58  Systolic 309 407 680  Diastolic 79 80 86  Pulse 62 - 72     Review of Systems  Constitutional: Negative.   Respiratory: Negative.    Cardiovascular: Negative.   Genitourinary:  Positive for dysuria, frequency and urgency.   Physical Exam Constitutional:      General:  She is not in acute distress. Cardiovascular:     Rate and Rhythm: Normal rate and regular rhythm.  Pulmonary:     Effort: Pulmonary effort is normal.     Breath sounds: Normal breath sounds.  Abdominal:     Tenderness: There is abdominal tenderness in the suprapubic area. There is left CVA tenderness.  Skin:    General: Skin is warm and dry.  Neurological:     Mental Status: She is alert and oriented to person, place, and time.  Psychiatric:        Mood and Affect: Affect normal.     Results for orders placed or performed in visit on 08/03/21  POCT URINALYSIS DIP (CLINITEK)  Result Value Ref Range   Color, UA yellow yellow   Clarity, UA clear clear   Glucose, UA negative negative mg/dL   Bilirubin, UA negative negative   Ketones, POC UA negative negative mg/dL   Spec Grav, UA 1.020 1.010 - 1.025   Blood, UA negative negative   pH, UA 7.0 5.0 - 8.0   POC PROTEIN,UA negative negative, trace   Urobilinogen, UA 0.2 0.2 or 1.0 E.U./dL   Nitrite, UA Negative Negative   Leukocytes, UA Trace (A) Negative    Assessment and Plan:  1. Urine frequency  Stay well hydrated   - POCT URINALYSIS DIP (CLINITEK)  Will order:  Meds ordered this encounter  Medications   sulfamethoxazole-trimethoprim (BACTRIM DS) 800-160 MG tablet    Sig: Take 1 tablet by mouth 2 (two) times daily.    Dispense:  14 tablet    Refill:  0   Patient Instructions  1. Urine frequency  - POCT URINALYSIS DIP (CLINITEK) - sulfamethoxazole-trimethoprim (BACTRIM DS) 800-160 MG tablet; Take 1 tablet by mouth 2 (two) times daily.  Dispense: 14 tablet; Refill: 0  2. Urinary tract infection without hematuria, site unspecified  - sulfamethoxazole-trimethoprim (BACTRIM DS) 800-160 MG tablet; Take 1 tablet by mouth 2 (two) times daily.  Dispense: 14 tablet; Refill: 0  Follow up:  Follow up with PCP if needed   Urinary Tract Infection, Adult A urinary tract infection (UTI) is an infection of any part of the  urinary tract. The urinary tract includes the kidneys, ureters, bladder, and urethra. These organs make, store, and get rid of urine in the body. An upper UTI affects the ureters and kidneys. A lower UTI affects the bladder and urethra. What are the causes? Most urinary tract infections are caused by bacteria in your genital area around your urethra, where urine leaves your body. These bacteria grow and cause inflammation of your urinary tract. What increases the risk? You are more likely to develop this condition if: You have a urinary catheter that stays in place. You are not able to control when you urinate or have a bowel movement (incontinence). You are female and you: Use a spermicide or diaphragm for birth control. Have low estrogen levels. Are pregnant. You have certain genes that increase your risk. You are sexually active. You take antibiotic medicines. You have a condition that causes your flow of urine to slow down, such as: An enlarged prostate, if you are female. Blockage in your urethra. A kidney stone. A nerve condition that affects your bladder control (neurogenic bladder). Not getting enough to drink, or not urinating often. You have certain medical conditions, such as: Diabetes. A weak disease-fighting system (immunesystem). Sickle cell disease. Gout. Spinal cord injury. What are the signs or symptoms? Symptoms of this condition include: Needing to urinate right away (urgency). Frequent urination. This may include small amounts of urine each time you urinate. Pain or burning with urination. Blood in the urine. Urine that smells bad or unusual. Trouble urinating. Cloudy urine. Vaginal discharge, if you are female. Pain in the abdomen or the lower back. You may also have: Vomiting or a decreased appetite. Confusion. Irritability or tiredness. A fever or chills. Diarrhea. The first symptom in older adults may be confusion. In some cases, they may not have any  symptoms until the infection has worsened. How is this diagnosed? This condition is diagnosed based on your medical history and a physical exam. You may also have other tests, including: Urine tests. Blood tests. Tests for STIs (sexually transmitted infections). If you have had more than one UTI, a cystoscopy or imaging studies may be done to determine the cause of the infections. How is this treated? Treatment for this condition includes: Antibiotic medicine. Over-the-counter medicines to treat discomfort. Drinking enough water to stay hydrated. If you have frequent infections or have other conditions such as a kidney stone, you may need to see a health care provider who specializes in the urinary tract (urologist). In rare cases, urinary tract infections can cause sepsis. Sepsis is a life-threatening condition that occurs when the body responds to an infection. Sepsis is treated in the hospital with IV antibiotics, fluids, and other medicines. Follow these instructions  at home: Medicines Take over-the-counter and prescription medicines only as told by your health care provider. If you were prescribed an antibiotic medicine, take it as told by your health care provider. Do not stop using the antibiotic even if you start to feel better. General instructions Make sure you: Empty your bladder often and completely. Do not hold urine for long periods of time. Empty your bladder after sex. Wipe from front to back after urinating or having a bowel movement if you are female. Use each tissue only one time when you wipe. Drink enough fluid to keep your urine pale yellow. Keep all follow-up visits. This is important. Contact a health care provider if: Your symptoms do not get better after 1-2 days. Your symptoms go away and then return. Get help right away if: You have severe pain in your back or your lower abdomen. You have a fever or chills. You have nausea or vomiting. Summary A urinary  tract infection (UTI) is an infection of any part of the urinary tract, which includes the kidneys, ureters, bladder, and urethra. Most urinary tract infections are caused by bacteria in your genital area. Treatment for this condition often includes antibiotic medicines. If you were prescribed an antibiotic medicine, take it as told by your health care provider. Do not stop using the antibiotic even if you start to feel better. Keep all follow-up visits. This is important. This information is not intended to replace advice given to you by your health care provider. Make sure you discuss any questions you have with your health care provider. Document Revised: 01/01/2020 Document Reviewed: 01/01/2020 Elsevier Patient Education  2022 [place], [place]

## 2021-08-03 NOTE — Patient Instructions (Signed)
1. Urine frequency ? ?- POCT URINALYSIS DIP (CLINITEK) ?- sulfamethoxazole-trimethoprim (BACTRIM DS) 800-160 MG tablet; Take 1 tablet by mouth 2 (two) times daily.  Dispense: 14 tablet; Refill: 0 ? ?2. Urinary tract infection without hematuria, site unspecified ? ?- sulfamethoxazole-trimethoprim (BACTRIM DS) 800-160 MG tablet; Take 1 tablet by mouth 2 (two) times daily.  Dispense: 14 tablet; Refill: 0 ? ?Follow up: ? ?Follow up with PCP if needed ? ? ?Urinary Tract Infection, Adult ?A urinary tract infection (UTI) is an infection of any part of the urinary tract. The urinary tract includes the kidneys, ureters, bladder, and urethra. These organs make, store, and get rid of urine in the body. ?An upper UTI affects the ureters and kidneys. A lower UTI affects the bladder and urethra. ?What are the causes? ?Most urinary tract infections are caused by bacteria in your genital area around your urethra, where urine leaves your body. These bacteria grow and cause inflammation of your urinary tract. ?What increases the risk? ?You are more likely to develop this condition if: ?You have a urinary catheter that stays in place. ?You are not able to control when you urinate or have a bowel movement (incontinence). ?You are female and you: ?Use a spermicide or diaphragm for birth control. ?Have low estrogen levels. ?Are pregnant. ?You have certain genes that increase your risk. ?You are sexually active. ?You take antibiotic medicines. ?You have a condition that causes your flow of urine to slow down, such as: ?An enlarged prostate, if you are female. ?Blockage in your urethra. ?A kidney stone. ?A nerve condition that affects your bladder control (neurogenic bladder). ?Not getting enough to drink, or not urinating often. ?You have certain medical conditions, such as: ?Diabetes. ?A weak disease-fighting system (immunesystem). ?Sickle cell disease. ?Gout. ?Spinal cord injury. ?What are the signs or symptoms? ?Symptoms of this  condition include: ?Needing to urinate right away (urgency). ?Frequent urination. This may include small amounts of urine each time you urinate. ?Pain or burning with urination. ?Blood in the urine. ?Urine that smells bad or unusual. ?Trouble urinating. ?Cloudy urine. ?Vaginal discharge, if you are female. ?Pain in the abdomen or the lower back. ?You may also have: ?Vomiting or a decreased appetite. ?Confusion. ?Irritability or tiredness. ?A fever or chills. ?Diarrhea. ?The first symptom in older adults may be confusion. In some cases, they may not have any symptoms until the infection has worsened. ?How is this diagnosed? ?This condition is diagnosed based on your medical history and a physical exam. You may also have other tests, including: ?Urine tests. ?Blood tests. ?Tests for STIs (sexually transmitted infections). ?If you have had more than one UTI, a cystoscopy or imaging studies may be done to determine the cause of the infections. ?How is this treated? ?Treatment for this condition includes: ?Antibiotic medicine. ?Over-the-counter medicines to treat discomfort. ?Drinking enough water to stay hydrated. ?If you have frequent infections or have other conditions such as a kidney stone, you may need to see a health care provider who specializes in the urinary tract (urologist). ?In rare cases, urinary tract infections can cause sepsis. Sepsis is a life-threatening condition that occurs when the body responds to an infection. Sepsis is treated in the hospital with IV antibiotics, fluids, and other medicines. ?Follow these instructions at home: ?Medicines ?Take over-the-counter and prescription medicines only as told by your health care provider. ?If you were prescribed an antibiotic medicine, take it as told by your health care provider. Do not stop using the antibiotic even if you  start to feel better. ?General instructions ?Make sure you: ?Empty your bladder often and completely. Do not hold urine for long  periods of time. ?Empty your bladder after sex. ?Wipe from front to back after urinating or having a bowel movement if you are female. Use each tissue only one time when you wipe. ?Drink enough fluid to keep your urine pale yellow. ?Keep all follow-up visits. This is important. ?Contact a health care provider if: ?Your symptoms do not get better after 1-2 days. ?Your symptoms go away and then return. ?Get help right away if: ?You have severe pain in your back or your lower abdomen. ?You have a fever or chills. ?You have nausea or vomiting. ?Summary ?A urinary tract infection (UTI) is an infection of any part of the urinary tract, which includes the kidneys, ureters, bladder, and urethra. ?Most urinary tract infections are caused by bacteria in your genital area. ?Treatment for this condition often includes antibiotic medicines. ?If you were prescribed an antibiotic medicine, take it as told by your health care provider. Do not stop using the antibiotic even if you start to feel better. ?Keep all follow-up visits. This is important. ?This information is not intended to replace advice given to you by your health care provider. Make sure you discuss any questions you have with your health care provider. ?Document Revised: 01/01/2020 Document Reviewed: 01/01/2020 ?Elsevier Patient Education ? 2022 [place]. ? ? ? ?

## 2021-08-03 NOTE — Telephone Encounter (Signed)
Pt is needing new referral for [facility]. ?

## 2021-08-03 NOTE — Progress Notes (Signed)
Knee and hip hurt going down steps feeling like it coming out of joints. ?

## 2021-08-03 NOTE — Telephone Encounter (Signed)
Done

## 2021-08-04 ENCOUNTER — Other Ambulatory Visit: Payer: Self-pay

## 2021-08-05 LAB — URINE CULTURE

## 2021-08-14 ENCOUNTER — Other Ambulatory Visit: Payer: Self-pay | Admitting: Surgery

## 2021-08-14 ENCOUNTER — Other Ambulatory Visit: Payer: Self-pay

## 2021-08-14 ENCOUNTER — Encounter: Payer: Self-pay | Admitting: Surgery

## 2021-08-14 ENCOUNTER — Ambulatory Visit (INDEPENDENT_AMBULATORY_CARE_PROVIDER_SITE_OTHER): Payer: Self-pay | Admitting: Surgery

## 2021-08-14 VITALS — BP 142/95 | HR 76 | Temp 98.3°F | Ht 64.0 in | Wt 206.4 lb

## 2021-08-14 DIAGNOSIS — G44009 Cluster headache syndrome, unspecified, not intractable: Secondary | ICD-10-CM | POA: Insufficient documentation

## 2021-08-14 DIAGNOSIS — N6001 Solitary cyst of right breast: Secondary | ICD-10-CM

## 2021-08-14 DIAGNOSIS — N644 Mastodynia: Secondary | ICD-10-CM

## 2021-08-14 NOTE — Patient Instructions (Addendum)
I will call you later today to let you know when the biopsy will be scheduled and if the [facility] program will cover the cost. ?

## 2021-08-14 NOTE — Progress Notes (Signed)
08/14/2021  Reason for Visit:  Right breast pain  Requesting Provider:  Charlott Rakes, MD  History of Present Illness: Kristen Moore is a 51 y.o. female presenting for evaluation of right breast pain.  The patient had a diagnostic bilateral mammogram and bilateral ultrasound on 06/14/2020 which showed a 7 x 7 x 3 mm cluster of cysts in the right breast at 12 o'clock position 3 cm from the nipple which is at the site of the patient's breast pain on the right side.  This was favored to represent a cluster of benign cysts and a diagnostic right mammogram and ultrasound were done as follow-up on 12/14/2020.  This shows stable appearance of the cysts at the 12 o'clock position with no new findings in the right breast.  This was again followed up with diagnostic bilateral mammogram and right breast ultrasound on 06/21/2021 which again confirmed the stability of the findings in the right breast with no suspicious findings on the left breast.  The patient continues to report pain in the right breast and she also reports that she has a history of breast cancer in her family with a maternal grandmother and a paternal aunt and she is worried that this area in the right breast may turn into cancer at some point.  She would like this area to be addressed or further evaluated so she can have more peace of mind.  The patient has a history of hysterectomy with bilateral salpingo-oophorectomy in 2014 and is G3, P1.  She did breast-fed.  She did use the birth control in the past consistent of estrogen. Age of first menses at 14 years.  Past Medical History: Past Medical History:  Diagnosis Date   Abnormal Pap smear    cryo   Anemia    Asthma    Bipolar 1 disorder ([place])    Cyst of breast    left breast   DUB (dysfunctional uterine bleeding)    Fibroids    Headache(784.0)    Hypothyroidism    IBS (irritable bowel syndrome)    Thyroid dysfunction      Past Surgical History: Past Surgical History:   Procedure Laterality Date   ABDOMINAL HYSTERECTOMY Bilateral 12/25/2012   Procedure: HYSTERECTOMY ABDOMINAL;  Surgeon: Emily Filbert, MD;  Location: [facility];  Service: Gynecology;  Laterality: Bilateral;  TAH and bilateral salpingectomy   BILATERAL SALPINGECTOMY Bilateral 12/25/2012   Procedure: BILATERAL SALPINGECTOMY;  Surgeon: Emily Filbert, MD;  Location: [facility];  Service: Gynecology;  Laterality: Bilateral;   CERVICAL BIOPSY  W/ LOOP ELECTRODE EXCISION     CHOLECYSTECTOMY     COLON SURGERY     DILATION AND CURETTAGE OF UTERUS     OOPHORECTOMY Bilateral 12/25/2012   Procedure: OOPHORECTOMY;  Surgeon: Emily Filbert, MD;  Location: [facility];  Service: Gynecology;  Laterality: Bilateral;   TUBAL LIGATION      Home Medications: Prior to Admission medications   Medication Sig Start Date End Date Taking? Authorizing Provider  atorvastatin (LIPITOR) 40 MG tablet TAKE 1 TABLET (40 MG TOTAL) BY MOUTH DAILY. 07/31/21  Yes Newlin, Charlane Ferretti, MD  buPROPion (WELLBUTRIN XL) 300 MG 24 hr tablet Take 1 tablet (300 mg total) by mouth daily. 06/06/21  Yes Eulis Canner E, NP  busPIRone (BUSPAR) 10 MG tablet Take 1 tablet (10 mg total) by mouth 3 (three) times daily. 06/06/21  Yes Eulis Canner E, NP  hydrOXYzine (ATARAX) 10 MG tablet Take 0.5 tablets (5 mg total) by mouth 3 (three)  times daily as needed. 03/07/21  Yes Eulis Canner E, NP  levothyroxine (SYNTHROID) 150 MCG tablet Take 1 tablet (150 mcg total) by mouth daily. 06/06/21  Yes Eulis Canner E, NP  lithium carbonate 300 MG capsule TAKE 1 CAPSULE BY MOUTH EVERY MORNING & TAKE 2 CAPSULES EVERY NIGHT AT BEDTIME 06/06/21  Yes Eulis Canner E, NP  meclizine (ANTIVERT) 25 MG tablet Take 1 tablet (25 mg total) by mouth 3 (three) times daily as needed for dizziness. 05/26/21  Yes Charlott Rakes, MD    Allergies: Allergies  Allergen Reactions   Bee Venom Swelling and Anaphylaxis   Latex Swelling   Penicillins Swelling   Omega-3 Hives and Swelling     Potatoes     Other Hives and Swelling    Potatoes       Social History:  reports that she has been smoking cigarettes. She has a 4.50 pack-year smoking history. She has never used smokeless tobacco. She reports that she does not drink alcohol and does not use drugs.   Family History: Family History  Problem Relation Age of Onset   Heart disease Father    Cancer Father 70       lung   Hypertension Mother    Rheum arthritis Mother    Fibromyalgia Mother    COPD Mother    Sleep apnea Mother    Thyroid disease Mother    Breast cancer Maternal Grandmother    Breast cancer Paternal Aunt     Review of Systems: Review of Systems  Constitutional:  Negative for fever.  HENT:  Negative for hearing loss.   Respiratory:  Negative for shortness of breath.   Cardiovascular:  Negative for chest pain.  Gastrointestinal:  Negative for abdominal pain, nausea and vomiting.  Genitourinary:  Negative for dysuria.  Musculoskeletal:  Negative for myalgias.  Skin:        Right breast pain  Neurological:  Negative for dizziness.  Psychiatric/Behavioral:  Negative for depression.    Physical Exam BP (!) 142/95    Pulse 76    Temp 98.3 F (36.8 C) (Oral)    Ht '5\' 4"'$  (1.626 m)    Wt 206 lb 6.4 oz (93.6 kg)    LMP 11/29/2012    SpO2 97%    BMI 35.43 kg/m  CONSTITUTIONAL: No acute distress, well-nourished HEENT:  Normocephalic, atraumatic, extraocular motion intact. NECK: Trachea is midline, and there is no jugular venous distension.  RESPIRATORY:  Lungs are clear, and breath sounds are equal bilaterally. Normal respiratory effort without pathologic use of accessory muscles. CARDIOVASCULAR: Heart is regular without murmurs, gallops, or rubs. BREAST: Right breast without really any palpable masses, skin changes, or nipple changes.  I am unable to palpate the area of cysts at the 12 o'clock position but that is the area where her tenderness is the worst although she does have tenderness throughout  the right breast.  No axillary lymphadenopathy.  Left breast without any palpable masses, skin changes, or nipple changes.  No left axillary lymphadenopathy. MUSCULOSKELETAL:  Normal muscle strength and tone in all four extremities.  No peripheral edema or cyanosis. SKIN: Skin turgor is normal. There are no pathologic skin lesions.  NEUROLOGIC:  Motor and sensation is grossly normal.  Cranial nerves are grossly intact. PSYCH:  Alert and oriented to person, place and time. Affect is normal.  Laboratory Analysis: No results found for this or any previous visit (from the past 24 hour(s)).  Imaging: Mammogram and ultrasound on 06/21/2021: FINDINGS:  No suspicious mass or malignant type microcalcifications identified in either breast.   Targeted ultrasound is performed, showing stable probable benign cluster of cysts in the right breast at 12 o'clock 3 cm from the nipple measuring 7 x 3 x 7 mm. On the prior ultrasound dated 06/14/2020 it measured 7 x 3 x 7 mm.   IMPRESSION: Stable probable cluster of cysts in the right breast.   RECOMMENDATION: Bilateral diagnostic mammogram and right breast ultrasound in 1 year is recommended document stability of the right breast for 2 years.   I have discussed the findings and recommendations with the patient. If applicable, a reminder letter will be sent to the patient regarding the next appointment.   BI-RADS CATEGORY  3: Probably benign.  Assessment and Plan: This is a 51 y.o. female with right breast pain and a cluster of small cyst measuring 7 mm in size.  - Discussed with the patient the findings on her mammograms and ultrasounds which showed a stable area in the right breast of a cluster of cysts measuring about 7 mm in size which has been stable since January of last year.  However the patient is worried that given her family history of breast cancer that this may convert into cancer down the line and would like this to be further evaluated.   I discussed with the patient that we could order an ultrasound-guided biopsy for further evaluation.  If the biopsy were to be benign, then nothing further will need to be done but continue with the yearly mammograms.  If the biopsy does show any suspicious findings then potentially we could do an excision of the area.  Patient is in agreement. - I will contact the patient with the results of the biopsy. -All questions have been answered.  I spent 40 minutes dedicated to the care of this patient on the date of this encounter to include pre-visit review of records, face-to-face time with the patient discussing diagnosis and management, and any post-visit coordination of care.   Melvyn Neth, [facility]

## 2021-08-15 ENCOUNTER — Telehealth: Payer: Self-pay

## 2021-08-15 NOTE — Telephone Encounter (Signed)
Left message for patient to call office- [facility] will not pay for breast biopsy-patient can complete financial paperwork if she wants to try this.  ?

## 2021-08-22 ENCOUNTER — Telehealth (INDEPENDENT_AMBULATORY_CARE_PROVIDER_SITE_OTHER): Payer: No Payment, Other | Admitting: Psychiatry

## 2021-08-22 DIAGNOSIS — F319 Bipolar disorder, unspecified: Secondary | ICD-10-CM | POA: Diagnosis not present

## 2021-08-22 DIAGNOSIS — F411 Generalized anxiety disorder: Secondary | ICD-10-CM

## 2021-08-22 DIAGNOSIS — F331 Major depressive disorder, recurrent, moderate: Secondary | ICD-10-CM

## 2021-08-22 NOTE — Progress Notes (Signed)
BH MD/PA/NP OP Progress Note ? ?08/22/2021 3:07 PM ?Kristen Moore  ?MRN:  [id number] ? ?Virtual Visit via Video Note ? ?I connected with Kristen Moore on 08/22/21 at  3:00 PM EDT by a video enabled telemedicine application and verified that I am speaking with the correct person using two identifiers. ? ?Location: ?Patient: home ?Provider: offsite ?  ?I discussed the limitations of evaluation and management by telemedicine and the availability of in person appointments. The patient expressed understanding and agreed to proceed. ? ?  ?I discussed the assessment and treatment plan with the patient. The patient was provided an opportunity to ask questions and all were answered. The patient agreed with the plan and demonstrated an understanding of the instructions. ?  ?The patient was advised to call back or seek an in-person evaluation if the symptoms worsen or if the condition fails to improve as anticipated. ? ?I provided 10 minutes of non-face-to-face time during this encounter. ? ? ?Franne Grip, NP  ? ?Chief Complaint: Medication management ? ?HPI: Kristen Moore is a 51 year old female presenting to [facility] behavioral health outpatient for follow-up psychiatric evaluation.  She has a psychiatric history of bipolar disorder, generalized anxiety disorder, and major depressive disorder.  Her symptoms are managed with Wellbutrin XL 300 mg daily, BuSpar 10 mg 3 times daily, hydroxyzine 10 mg 3 times daily as needed for anxiety, lithium 300 mg in the morning and 600 mg at bedtime.  Patient reports that medications are effective and denies adverse effects.  Patient reports medication compliance and denies the need for dose adjustment.  No medication changes today.  Patient has an adequate amount of refills remaining and he does not require medication refills today. ?Patient is alert and oriented x 4, calm and willing to engage. She reports multiple stressors stating that her mother is dying of  cancer and has been placed with hospice care. Patient also reports that her daughter is addicted to crack cocaine and methamphetamines and her actions disrupts the home when she is there. She denies AVH/SI/HI/paranoia or delusional thought. ? ?Visit Diagnosis:  ?  ICD-10-CM   ?1. Major depressive disorder, recurrent episode, moderate with anxious distress ([place])  F33.1   ?  ?2. Generalized anxiety disorder  F41.1   ?  ?3. Bipolar depression ([place])  F31.9   ?  ? ? ?Past Psychiatric History: bipolar disorder, major depressive disorder, generalized anxiety disorder ? ?Past Medical History:  ?Past Medical History:  ?Diagnosis Date  ? Abnormal Pap smear   ? cryo  ? Anemia   ? Asthma   ? Bipolar 1 disorder ([place])   ? Cyst of breast   ? left breast  ? DUB (dysfunctional uterine bleeding)   ? Fibroids   ? Headache(784.0)   ? Hypothyroidism   ? IBS (irritable bowel syndrome)   ? Thyroid dysfunction   ?  ?Past Surgical History:  ?Procedure Laterality Date  ? ABDOMINAL HYSTERECTOMY Bilateral 12/25/2012  ? Procedure: HYSTERECTOMY ABDOMINAL;  Surgeon: Emily Filbert, MD;  Location: [facility];  Service: Gynecology;  Laterality: Bilateral;  TAH and bilateral salpingectomy  ? BILATERAL SALPINGECTOMY Bilateral 12/25/2012  ? Procedure: BILATERAL SALPINGECTOMY;  Surgeon: Emily Filbert, MD;  Location: [facility];  Service: Gynecology;  Laterality: Bilateral;  ? CERVICAL BIOPSY  W/ LOOP ELECTRODE EXCISION    ? CHOLECYSTECTOMY    ? COLON SURGERY    ? DILATION AND CURETTAGE OF UTERUS    ? OOPHORECTOMY Bilateral 12/25/2012  ? Procedure:  OOPHORECTOMY;  Surgeon: Emily Filbert, MD;  Location: [facility];  Service: Gynecology;  Laterality: Bilateral;  ? TUBAL LIGATION    ? ? ?Family Psychiatric History: none known ? ?Family History:  ?Family History  ?Problem Relation Age of Onset  ? Heart disease Father   ? Cancer Father 78  ?     lung  ? Hypertension Mother   ? Rheum arthritis Mother   ? Fibromyalgia Mother   ? COPD Mother   ? Sleep apnea Mother   ? Thyroid disease  Mother   ? Breast cancer Maternal Grandmother   ? Breast cancer Paternal Aunt   ? ? ?Social History:  ?Social History  ? ?Socioeconomic History  ? Marital status: Single  ?  Spouse name: Not on file  ? Number of children: 1  ? Years of education: Not on file  ? Highest education level: Associate degree: occupational, Hotel manager, or vocational program  ?Occupational History  ? Not on file  ?Tobacco Use  ? Smoking status: Every Day  ?  Packs/day: 0.50  ?  Years: 9.00  ?  Pack years: 4.50  ?  Types: Cigarettes  ? Smokeless tobacco: Never  ?Vaping Use  ? Vaping Use: Never used  ?Substance and Sexual Activity  ? Alcohol use: No  ?  Alcohol/week: 0.0 standard drinks  ? Drug use: No  ? Sexual activity: Not Currently  ?  Birth control/protection: Surgical  ?Other Topics Concern  ? Not on file  ?Social History Narrative  ? Mother has ovarian cancer that has spread to lungs, Dad has lung cancer that has spread to kidneys, and disabled brother all live in home with patient. Patient is primary caregiver. Daughter is drug addict, and has loss custody of patient's infant granddaughter in 02/2020. Patient works as a Scientist, water quality. Patient is very stressed and saddened about her current situation.   ? ?Social Determinants of Health  ? ?Financial Resource Strain: Not on file  ?Food Insecurity: No Food Insecurity  ? Worried About Charity fundraiser in the Last Year: Never true  ? Ran Out of Food in the Last Year: Never true  ?Transportation Needs: Unmet Transportation Needs  ? Lack of Transportation (Medical): Yes  ? Lack of Transportation (Non-Medical): Yes  ?Physical Activity: Not on file  ?Stress: Not on file  ?Social Connections: Not on file  ? ? ?Allergies:  ? ? ?Metabolic Disorder Labs: ?Lab Results  ?Component Value Date  ? HGBA1C 5.1 04/25/2020  ? MPG 88.19 04/18/2018  ? ?No results found for: PROLACTIN ?Lab Results  ?Component Value Date  ? CHOL 110 01/26/2021  ? TRIG 64 01/26/2021  ? HDL 47 01/26/2021  ? CHOLHDL 2.3 01/26/2021  ?  VLDL 56 (H) 04/18/2018  ? LDLCALC 49 01/26/2021  ? [place] 145 (H) 06/30/2019  ? ?Lab Results  ?Component Value Date  ? TSH 1.500 05/26/2021  ? TSH 0.107 (L) 01/26/2021  ? ? ?Therapeutic Level Labs: ?Lab Results  ?Component Value Date  ? LITHIUM 0.8 01/26/2021  ? LITHIUM 0.3 (L) 04/30/2018  ? ?No results found for: VALPROATE ?No components found for:  CBMZ ? ?Current Medications: ?Current Outpatient Medications  ?Medication Sig Dispense Refill  ? atorvastatin (LIPITOR) 40 MG tablet TAKE 1 TABLET (40 MG TOTAL) BY MOUTH DAILY. 30 tablet 3  ? buPROPion (WELLBUTRIN XL) 300 MG 24 hr tablet Take 1 tablet (300 mg total) by mouth daily. 30 tablet 3  ? busPIRone (BUSPAR) 10 MG tablet Take 1 tablet (10  mg total) by mouth 3 (three) times daily. 90 tablet 3  ? hydrOXYzine (ATARAX) 10 MG tablet Take 0.5 tablets (5 mg total) by mouth 3 (three) times daily as needed. 45 tablet 3  ? levothyroxine (SYNTHROID) 150 MCG tablet Take 1 tablet (150 mcg total) by mouth daily. 30 tablet 6  ? lithium carbonate 300 MG capsule TAKE 1 CAPSULE BY MOUTH EVERY MORNING & TAKE 2 CAPSULES EVERY NIGHT AT BEDTIME 90 capsule 3  ? meclizine (ANTIVERT) 25 MG tablet Take 1 tablet (25 mg total) by mouth 3 (three) times daily as needed for dizziness. 30 tablet 0  ? ?No current facility-administered medications for this visit.  ? ? ? ?Musculoskeletal: ?Strength & Muscle Tone:  n/a virtual visit ?Gait & Station:  n/a virtual visit ?Patient leans: N/A ? ?Psychiatric Specialty Exam: ?Review of Systems  ?Psychiatric/Behavioral:  Positive for dysphoric mood. Negative for hallucinations, self-injury and suicidal ideas.   ?All other systems reviewed and are negative.  ?Last menstrual period 11/29/2012.There is no height or weight on file to calculate BMI.  ?General Appearance: Fairly Groomed  ?Eye Contact:  Good  ?Speech:  Clear and Coherent  ?Volume:  Normal  ?Mood:  Dysphoric  ?Affect:  Congruent  ?Thought Process:  Goal Directed  ?Orientation:  Full (Time, Place,  and Person)  ?Thought Content: Logical   ?Suicidal Thoughts:  No  ?Homicidal Thoughts:  No  ?Memory:  Immediate;   Good ?Recent;   Good ?Remote;   Good  ?Judgement:  Good  ?Insight:  Good  ?Psychomotor

## 2021-08-29 ENCOUNTER — Ambulatory Visit (INDEPENDENT_AMBULATORY_CARE_PROVIDER_SITE_OTHER): Payer: No Payment, Other | Admitting: Licensed Clinical Social Worker

## 2021-08-29 DIAGNOSIS — F411 Generalized anxiety disorder: Secondary | ICD-10-CM

## 2021-08-29 NOTE — Progress Notes (Signed)
? ?  THERAPIST PROGRESS NOTE ? ?Virtual Visit via Telephone Note ? ?I connected with Kristen Moore on 08/29/21 at 11:00 AM EDT by telephone and verified that I am speaking with the correct person using two identifiers. ? ?Location: ?Patient: Home ?Provider: Home ?  ?I discussed the limitations, risks, security and privacy concerns of performing an evaluation and management service by telephone and the availability of in person appointments. I also discussed with the patient that there may be a patient responsible charge related to this service. The patient expressed understanding and agreed to proceed. ?I discussed the assessment and treatment plan with the patient. The patient was provided an opportunity to ask questions and all were answered. The patient agreed with the plan and demonstrated an understanding of the instructions. ?  ?The patient was advised to call back or seek an in-person evaluation if the symptoms worsen or if the condition fails to improve as anticipated. ? ?I provided 30 minutes of non-face-to-face time during this encounter. ? ?Participation Level: Active ? ?Behavioral Response: CasualAlertAnxious ? ?Type of Therapy: Individual Therapy ? ?Treatment Goals addressed: anx/dep/stressors/coping ? ?ProgressTowards Goals: Progressing ? ?Interventions: Solution Focused and Supportive ? ?Summary: Kristen Moore is a 51 y.o. female who presents with hx of anx/dep. Today LCSW sees pt is trying to log on for session but there is no audio. She logs off and on several times. LCSW phones pt but call goes to vm. Left message and tried again two more times by phone while sending text link multiple times. Requested admin staff call from the office # and pt answers. Call transferred to LCSW. Pt advises she got frustrated and gave up with video. She is agreeable to talk by phone. Larena Glassman gives update on her mother. She reports hospice care has been accepted in the home. Pt states mother continues to  decline and there is some discussion of her going to inpatient hospice home. Mother is reportedly increasingly irritable and yelling at [place] who is having difficulty coping. Larena Glassman is vocal about how exhausted she is trying to juggle her mom's care and work. LCSW addressed some coping strategies to cope with mother's care. Pt states she is continuing at [employer] and working 7-8 hr shifts. She states she is mostly pleased with her job but again expresses fatigue since she is up and down with mother through the night. Pt taking meds as prescribed. LCSW informs pt of this clinician's resignation from [facility]. LCSW assisted pt to process thoughts/feelings re change in care. Pt distressed but ultimately states understanding. LCSW provided info on transition plan for new counselor. Pt states appreciation for all care provided.   ? ?Suicidal/Homicidal: Nowithout intent/plan ? ?Therapist Response: Pt receptive to care. ? ?Plan: Return again for next avail appt with new counselor as this LCSW has resigned. ? ?Diagnosis: GAD ? ?Collaboration of Care: Other None deemed necessary this session ? ?Patient/Guardian was advised Release of Information must be obtained prior to any record release in order to collaborate their care with an outside provider. Patient/Guardian was advised if they have not already done so to contact the registration department to sign all necessary forms in order for [name] to release information regarding their care.  ? ?Consent: Patient/Guardian gives verbal consent for treatment and assignment of benefits for services provided during this visit. Patient/Guardian expressed understanding and agreed to proceed.  ? ?[name], LCSW ?08/29/2021 ? ?

## 2021-08-31 ENCOUNTER — Other Ambulatory Visit: Payer: Self-pay

## 2021-09-01 ENCOUNTER — Ambulatory Visit (HOSPITAL_COMMUNITY): Payer: No Payment, Other | Admitting: Licensed Clinical Social Worker

## 2021-09-02 ENCOUNTER — Encounter (HOSPITAL_COMMUNITY): Payer: Self-pay | Admitting: Emergency Medicine

## 2021-09-02 ENCOUNTER — Emergency Department (HOSPITAL_COMMUNITY)
Admission: EM | Admit: 2021-09-02 | Discharge: 2021-09-02 | Disposition: A | Discharge status: Home / Self Care | Payer: No Typology Code available for payment source | Attending: Emergency Medicine | Admitting: Emergency Medicine

## 2021-09-02 ENCOUNTER — Other Ambulatory Visit: Payer: Self-pay

## 2021-09-02 DIAGNOSIS — Z20822 Contact with and (suspected) exposure to covid-19: Secondary | ICD-10-CM | POA: Insufficient documentation

## 2021-09-02 DIAGNOSIS — J45909 Unspecified asthma, uncomplicated: Secondary | ICD-10-CM | POA: Insufficient documentation

## 2021-09-02 DIAGNOSIS — Z91040 Latex allergy status: Secondary | ICD-10-CM | POA: Insufficient documentation

## 2021-09-02 DIAGNOSIS — R0981 Nasal congestion: Secondary | ICD-10-CM

## 2021-09-02 DIAGNOSIS — J069 Acute upper respiratory infection, unspecified: Secondary | ICD-10-CM | POA: Insufficient documentation

## 2021-09-02 DIAGNOSIS — M542 Cervicalgia: Secondary | ICD-10-CM | POA: Insufficient documentation

## 2021-09-02 LAB — RESP PANEL BY RT-PCR (FLU A&B, COVID) ARPGX2
Influenza A by PCR: NEGATIVE
Influenza B by PCR: NEGATIVE
SARS Coronavirus 2 by RT PCR: NEGATIVE

## 2021-09-02 NOTE — ED Provider Notes (Signed)
?[facility] DEPT ?Provider Note ? ? ?CSN: [id number] ?Arrival date & time: 09/02/21  1612 ? ?  ? ?History ? ?Chief Complaint  ?Patient presents with  ? Nasal Congestion  ? ? ?Kristen Moore is a 51 y.o. female with a past medical history significant for bipolar disorder, IBS who presents with congestion, sneezing, some sore throat, "flulike symptoms" for the last few days.  Patient reports that she is at facility taking care of her mother who is in hospice at end-of-life.  Patient reports that her other parent has cancer as well.  Patient reports that she has had decreased sleep, increased life stress.  She denies any chest pain, shortness of breath.  She reports a remote history of asthma but has not had to use an inhaler for many years.  Patient reports she tried some over-the-counter allergy medication because she has historically had some hayfever in the springtime.  She took her first dose earlier today and denies significant improvement.  She denies any Objective fever but has felt some cold chills for the last day. ? ?HPI ? ?  ? ?Home Medications ?Prior to Admission medications   ?Medication Sig Start Date End Date Taking? Authorizing Provider  ?atorvastatin (LIPITOR) 40 MG tablet TAKE 1 TABLET (40 MG TOTAL) BY MOUTH DAILY. 07/31/21   Charlott Rakes, MD  ?buPROPion (WELLBUTRIN XL) 300 MG 24 hr tablet Take 1 tablet (300 mg total) by mouth daily. 06/06/21   Salley Slaughter, NP  ?busPIRone (BUSPAR) 10 MG tablet Take 1 tablet (10 mg total) by mouth 3 (three) times daily. 06/06/21   Salley Slaughter, NP  ?hydrOXYzine (ATARAX) 10 MG tablet Take 0.5 tablets (5 mg total) by mouth 3 (three) times daily as needed. 03/07/21   Salley Slaughter, NP  ?levothyroxine (SYNTHROID) 150 MCG tablet Take 1 tablet (150 mcg total) by mouth daily. 06/06/21   Salley Slaughter, NP  ?lithium carbonate 300 MG capsule TAKE 1 CAPSULE BY MOUTH EVERY MORNING & TAKE 2 CAPSULES EVERY NIGHT AT BEDTIME  06/06/21   Salley Slaughter, NP  ?meclizine (ANTIVERT) 25 MG tablet Take 1 tablet (25 mg total) by mouth 3 (three) times daily as needed for dizziness. 05/26/21   Charlott Rakes, MD  ?   ? ?Allergies    ?Bee venom, Latex, Penicillins, Omega-3, and Other   ? ?Review of Systems   ?Review of Systems  ?Constitutional:  Positive for chills and fatigue.  ?HENT:  Positive for congestion and sore throat.   ?All other systems reviewed and are negative. ? ?Physical Exam ?Updated Vital Signs ?BP (!) 127/96 (BP Location: Left Arm)   Pulse 72   Temp 98.5 ?F (36.9 ?C) (Oral)   Resp 18   LMP 11/29/2012   SpO2 95%  ?Physical Exam ?Vitals and nursing note reviewed.  ?Constitutional:   ?   General: She is not in acute distress. ?   Appearance: Normal appearance.  ?HENT:  ?   Head: Normocephalic and atraumatic.  ?   Mouth/Throat:  ?   Comments: Posterior oropharynx mildly erythematous, tonsils 1-2+ bilaterally.  No evidence of peritonsillar abscess.  Uvula midline.  Intact swallow reflex. ?Eyes:  ?   General:     ?   Right eye: No discharge.     ?   Left eye: No discharge.  ?Neck:  ?   Comments: No significant cervical adenopathy noted.  Patient does endorse some tenderness to palpation of the neck without soft tissue swelling,  redness, evidence of fluctuance. ?Cardiovascular:  ?   Rate and Rhythm: Normal rate and regular rhythm.  ?   Heart sounds: No murmur heard. ?  No friction rub. No gallop.  ?Pulmonary:  ?   Effort: Pulmonary effort is normal.  ?   Breath sounds: Normal breath sounds.  ?Abdominal:  ?   General: Bowel sounds are normal.  ?   Palpations: Abdomen is soft.  ?Skin: ?   General: Skin is warm and dry.  ?   Capillary Refill: Capillary refill takes less than 2 seconds.  ?Neurological:  ?   Mental Status: She is alert and oriented to person, place, and time.  ?Psychiatric:     ?   Mood and Affect: Mood normal.     ?   Behavior: Behavior normal.  ? ? ?ED Results / Procedures / Treatments   ?Labs ?(all labs ordered  are listed, but only abnormal results are displayed) ?Labs Reviewed  ?RESP PANEL BY RT-PCR (FLU A&B, COVID) [id number]  ? ? ?EKG ?None ? ?Radiology ?No results found. ? ?Procedures ?Procedures  ? ? ?Medications Ordered in ED ?Medications - No data to display ? ?ED Course/ Medical Decision Making/ A&P ?  ?                        ?Medical Decision Making ? ?Is an overall well-appearing 51 year old female who presents with 2 days of upper respiratory infectious symptoms, congestion.  Members differential diagnosis includes allergic rhinitis, upper respiratory infection, less clinical concern for pneumonia, acute bronchitis, acute HIV, electrolyte abnormality versus other.  Physical exam overall unremarkable, patient with mild tonsillar swelling without exudate.  She has signs symptoms which are consistent with early upper respiratory infection versus allergic rhinitis.  Patient also has had some significantly increased life stress, decreased sleep which could add to her clinical gestalt. ? ?We will test for COVID, flu follow-up based on these results.  RVP negative for COVID, flu.  Patient remains overall well-appearing.  Discussed I have low clinical concern for active pneumonia, acute bronchitis, or other significant infection requiring additional work-up at this time.  Clinically think the patient would benefit from some over-the-counter cold and flu medication, rest, return to counseling services to help with life stressors.  Patient discharged in stable condition, extensive return precautions given.   Patient understands and agrees with this plan. ?Final Clinical Impression(s) / ED Diagnoses ?Final diagnoses:  ?Nasal congestion  ?Viral upper respiratory tract infection  ? ? ?Rx / DC Orders ?ED Discharge Orders   ? ? None  ? ?  ? ? ?  ?Anselmo Pickler, PA-C ?09/02/21 1727 ? ?  ?Daleen Bo, MD ?09/03/21 1651 ? ?

## 2021-09-02 NOTE — ED Triage Notes (Signed)
Pt reports nasal congestion, "flu like symptoms" x a few days.  ?

## 2021-09-02 NOTE — Discharge Instructions (Signed)
As we discussed you can take some over-the-counter 24-hour allergy medicine such as Allegra, Zyrtec, Xyzal once daily, you can use Mucinex to help with your congestion, lozenges to help with sore throat.  Please return to the emergency department if your symptoms worsen, you have a fever that does not respond to ibuprofen, Tylenol.  Recommend you get as much rest as you can, drink plenty of fluids.  It was a pleasure taking care of you today, I hope that you feel better soon. ?

## 2021-09-05 ENCOUNTER — Other Ambulatory Visit: Payer: Self-pay

## 2021-09-08 ENCOUNTER — Other Ambulatory Visit: Payer: Self-pay

## 2021-09-12 ENCOUNTER — Telehealth (HOSPITAL_COMMUNITY): Payer: Self-pay | Admitting: Licensed Clinical Social Worker

## 2021-09-12 ENCOUNTER — Ambulatory Visit (INDEPENDENT_AMBULATORY_CARE_PROVIDER_SITE_OTHER): Payer: No Payment, Other | Admitting: Licensed Clinical Social Worker

## 2021-09-12 DIAGNOSIS — F331 Major depressive disorder, recurrent, moderate: Secondary | ICD-10-CM

## 2021-09-12 NOTE — Telephone Encounter (Signed)
See therapy note from today for information regarding telephone contacts today. ? ?Adam Phenix, MA, LCSW, [facility], LCAS ?09/12/2021 ?
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
## 2021-09-19 ENCOUNTER — Ambulatory Visit (HOSPITAL_COMMUNITY): Payer: No Payment, Other | Admitting: Licensed Clinical Social Worker

## 2021-09-26 ENCOUNTER — Telehealth (HOSPITAL_COMMUNITY): Payer: Self-pay | Admitting: Licensed Clinical Social Worker

## 2021-09-26 ENCOUNTER — Encounter (HOSPITAL_COMMUNITY): Payer: Self-pay

## 2021-09-26 ENCOUNTER — Ambulatory Visit (HOSPITAL_COMMUNITY): Payer: Commercial Managed Care - HMO | Admitting: Licensed Clinical Social Worker

## 2021-09-26 NOTE — Telephone Encounter (Signed)
The therapist calls Kristen Moore back, and she says that her father is going into [facility] today and that she has so much going on and is "grouchy" and says that she forgot the scheduled appointment. She says, "I hope I don't lose my job" and apologizes several times about the appointment saying that she will call the front desk to schedule when things calm down.  ? ?Adam Phenix, MA, LCSW, [facility], LCAS ?09/26/2021 ?

## 2021-09-26 NOTE — Telephone Encounter (Signed)
The therapist calls Kristen Moore at 11:14 a.m. EST as she had an 11 a.m. appointment today with this therapist; however, did not show in the virtual waiting room after the therapist emailed and text messaged her the links for the appointment at 11 a.m. ? ?When the therapist reaches her, she says that she did not even know that she had an appointment today. The therapist asks if she would like to meet today to which she responds, "hold on a second" before disconnecting.  ? ?Adam Phenix, MA, LCSW, [facility], LCAS ?09/26/2021 ?

## 2021-09-27 ENCOUNTER — Ambulatory Visit: Payer: Self-pay

## 2021-09-27 NOTE — Telephone Encounter (Signed)
?  Chief Complaint: numbness and tingling to both hands and  ?Symptoms: numbness and tingling to both arms and hands. Blurred vision ?Frequency: last few weeks ?Pertinent Negatives: Patient denies slurred speech, dizziness,  ?Disposition: '[x]'$ ED /'[]'$ Urgent Care (no appt availability in office) / '[]'$ Appointment(In office/virtual)/ '[]'$  [facility]/ '[]'$ Home Care/ '[]'$ Refused Recommended Disposition /'[]'$ [facility]/ '[]'$  Follow-up with PCP ?Additional Notes: Due to concerning sx, pt stated she will go to ED tomorrow. Advised pt to go now and if able to work tomorrow she can switch days. Advised pt not to wait. Pt verbalized understanding. ? ? ? ?Reason for Disposition ? [1] Numbness or tingling on both sides of body AND [2] is a new symptom present > 24 hours ? ?Answer Assessment - Initial Assessment Questions ?1. SYMPTOM: "What is the main symptom you are concerned about?" (e.g., weakness, numbness) ?    Numbness and tingling and - hands to right  other numbness to shoulder, sharp pain under left axilla ?2. ONSET: "When did this start?" (minutes, hours, days; while sleeping) ?     ?3. LAST NORMAL: "When was the last time you (the patient) were normal (no symptoms)?" ?   This month ?4. PATTERN "Does this come and go, or has it been constant since it started?"  "Is it present now?" ?    Constant- yes ?5. CARDIAC SYMPTOMS: "Have you had any of the following symptoms: chest pain, difficulty breathing, palpitations?" ?    no ?6. NEUROLOGIC SYMPTOMS: "Have you had any of the following symptoms: headache, dizziness, vision loss, double vision, changes in speech, unsteady on your feet?" ?    Blurry vision ,  reading is blurred ?7. OTHER SYMPTOMS: "Do you have any other symptoms?" ?    Constipation, pain in legs  ?8. PREGNANCY: "Is there any chance you are pregnant?" "When was your last menstrual period?" ?    *No Answer* ? ?Protocols used: Neurologic Deficit-A-AH ? ?

## 2021-09-28 ENCOUNTER — Other Ambulatory Visit (HOSPITAL_COMMUNITY): Payer: Self-pay

## 2021-09-28 ENCOUNTER — Encounter (HOSPITAL_COMMUNITY): Payer: Self-pay | Admitting: Emergency Medicine

## 2021-09-28 ENCOUNTER — Other Ambulatory Visit: Payer: Self-pay

## 2021-09-28 ENCOUNTER — Emergency Department (HOSPITAL_COMMUNITY)
Admission: EM | Admit: 2021-09-28 | Discharge: 2021-09-28 | Disposition: A | Discharge status: Home / Self Care | Payer: No Typology Code available for payment source | Attending: Emergency Medicine | Admitting: Emergency Medicine

## 2021-09-28 DIAGNOSIS — M79641 Pain in right hand: Secondary | ICD-10-CM | POA: Insufficient documentation

## 2021-09-28 DIAGNOSIS — R209 Unspecified disturbances of skin sensation: Secondary | ICD-10-CM | POA: Insufficient documentation

## 2021-09-28 DIAGNOSIS — Z91040 Latex allergy status: Secondary | ICD-10-CM | POA: Insufficient documentation

## 2021-09-28 DIAGNOSIS — Z79899 Other long term (current) drug therapy: Secondary | ICD-10-CM | POA: Insufficient documentation

## 2021-09-28 DIAGNOSIS — M79642 Pain in left hand: Secondary | ICD-10-CM | POA: Insufficient documentation

## 2021-09-28 MED ORDER — METHYLPREDNISOLONE 4 MG PO TBPK
ORAL_TABLET | ORAL | 0 refills | Status: DC
  Filled 2021-09-28: qty 21, 6d supply, fill #0

## 2021-09-28 NOTE — ED Triage Notes (Signed)
Pt reports waking up with numbness in both of her hands x a few weeks now.  ?

## 2021-09-28 NOTE — ED Notes (Signed)
I provided reinforced discharge education based off of discharge instructions. Pt acknowledged and understood my education. Pt had no further questions/concerns for provider/myself.  °

## 2021-09-28 NOTE — ED Notes (Signed)
Pt ambulatory without assistance.  

## 2021-09-28 NOTE — ED Provider Notes (Signed)
?[facility] DEPT ?Provider Note ? ? ?CSN: [id number] ?Arrival date & time: 09/28/21  1246 ? ?  ? ?History ? ?Chief Complaint  ?Patient presents with  ? Numbness in Bilateral Hands   ? ? ?Kristen Moore is a 51 y.o. female. ? ?Patient is here with bilateral hand pain and tingling.  History of carpal tunnel in the past.  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  She has had carpal tunnel on the right hand but never had surgery for it.  Symptoms are worse in the right hearing compared to the left but also has right shoulder rotator cuff issues as well.  Denies any fevers or chills.  No trauma.  Has tried some over-the-counter medications without much relief.  No neck pain, no headache, no vision loss or other weakness or numbness. ? ? ? ?  ? ?Home Medications ?Prior to Admission medications   ?Medication Sig Start Date End Date Taking? Authorizing Provider  ?methylPREDNISolone (MEDROL DOSEPAK) 4 MG TBPK tablet Follow package insert 09/28/21  Yes Stephen Baruch, DO  ?atorvastatin (LIPITOR) 40 MG tablet TAKE 1 TABLET (40 MG TOTAL) BY MOUTH DAILY. 07/31/21   Charlott Rakes, MD  ?buPROPion (WELLBUTRIN XL) 300 MG 24 hr tablet Take 1 tablet (300 mg total) by mouth daily. 06/06/21   Salley Slaughter, NP  ?busPIRone (BUSPAR) 10 MG tablet Take 1 tablet (10 mg total) by mouth 3 (three) times daily. 06/06/21   Salley Slaughter, NP  ?hydrOXYzine (ATARAX) 10 MG tablet Take 0.5 tablets (5 mg total) by mouth 3 (three) times daily as needed. 03/07/21   Salley Slaughter, NP  ?levothyroxine (SYNTHROID) 150 MCG tablet Take 1 tablet (150 mcg total) by mouth daily. 06/06/21   Salley Slaughter, NP  ?lithium carbonate 300 MG capsule TAKE 1 CAPSULE BY MOUTH EVERY MORNING & TAKE 2 CAPSULES EVERY NIGHT AT BEDTIME 06/06/21   Salley Slaughter, NP  ?meclizine (ANTIVERT) 25 MG tablet Take 1 tablet (25 mg total) by mouth 3 (three) times daily as needed for dizziness. 05/26/21    Charlott Rakes, MD  ?   ? ?Allergies    ?Bee venom, Latex, Penicillins, Omega-3, and Other   ? ?Review of Systems   ?Review of Systems ? ?Physical Exam ?Updated Vital Signs ?BP (!) 141/99 (BP Location: Left Arm)   Pulse 66   Temp 97.6 ?F (36.4 ?C) (Oral)   Resp 18   LMP 11/29/2012   SpO2 96%  ?Physical Exam ?Constitutional:   ?   General: She is not in acute distress. ?   Appearance: She is not ill-appearing.  ?HENT:  ?   Nose: Nose normal.  ?Cardiovascular:  ?   Pulses: Normal pulses.  ?   Heart sounds: Normal heart sounds.  ?Musculoskeletal:     ?   General: No tenderness. Normal range of motion.  ?   Cervical back: Normal range of motion. No tenderness.  ?Neurological:  ?   General: No focal deficit present.  ?   Mental Status: She is alert and oriented to person, place, and time.  ?   Cranial Nerves: No cranial nerve deficit.  ?   Motor: No weakness.  ?   Coordination: Coordination normal.  ?   Comments: Subjective numbness to both hands bilaterally from the wrist down  ? ? ?ED Results / Procedures / Treatments   ?Labs ?(all labs ordered are listed, but only abnormal results  are displayed) ?Labs Reviewed - No data to display ? ?EKG ?None ? ?Radiology ?No results found. ? ?Procedures ?Procedures  ? ? ?Medications Ordered in ED ?Medications - No data to display ? ?ED Course/ Medical Decision Making/ A&P ?  ?                        ?Medical Decision Making ?Risk ?Prescription drug management. ? ? ?Kristen Moore is here with numbness and paresthesias and pain to bilateral hands.  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  Has some chronic right shoulder issues as well.  Denies any headache, neck pain, trauma.  She states for several weeks she has been having some intermittent hand discomfort bilaterally.  Worse on the right than the left.  Feels tingling throughout all of her fingers at times.  She has some subjective numbness to bilateral hands and all  fingers from the wrist down to the tip of her fingers.  Numbness is not anywhere else.  Patient overall has no midline spinal pain.  Overall suspect that this is overuse process.  Whether its carpal tunnel or some other inflammatory process we will treat with Medrol Dosepak.  Recommend Tylenol.  Recommend follow-up with primary care doctor.  She is neurovascular neuromuscular intact otherwise.  She has normal strength and good pulses in her upper extremities.  Discharged in good condition. ? ?This chart was dictated using voice recognition software.  Despite best efforts to proofread,  errors can occur which can change the documentation meaning.  ? ? ? ? ? ? ? ?Final Clinical Impression(s) / ED Diagnoses ?Final diagnoses:  ?Bilateral hand pain  ? ? ?Rx / DC Orders ?ED Discharge Orders   ? ?      Ordered  ?  methylPREDNISolone (MEDROL DOSEPAK) 4 MG TBPK tablet       ? 09/28/21 1319  ? ?  ?  ? ?  ? ? ?  ?Lennice Sites, DO ?09/28/21 1322 ? ?

## 2021-09-28 NOTE — Discharge Instructions (Addendum)
Recommend 1000 mg of Tylenol every 6 hours as needed for pain.  Take steroid Dosepak as prescribed.  Follow-up with your primary care doctor.  Suspect that your pain is likely from overuse injury, chronic inflammation. ?

## 2021-10-02 ENCOUNTER — Other Ambulatory Visit: Payer: Self-pay

## 2021-10-04 ENCOUNTER — Other Ambulatory Visit: Payer: Self-pay

## 2021-10-06 ENCOUNTER — Ambulatory Visit (HOSPITAL_COMMUNITY): Payer: Self-pay | Admitting: Licensed Clinical Social Worker

## 2021-10-23 ENCOUNTER — Telehealth (HOSPITAL_COMMUNITY): Payer: No Payment, Other | Admitting: Psychiatry

## 2021-10-24 ENCOUNTER — Encounter (HOSPITAL_COMMUNITY): Payer: Self-pay

## 2021-10-24 ENCOUNTER — Telehealth (HOSPITAL_COMMUNITY): Payer: Commercial Managed Care - HMO | Admitting: Psychiatry

## 2021-11-01 ENCOUNTER — Other Ambulatory Visit: Payer: Self-pay

## 2021-11-01 ENCOUNTER — Telehealth (HOSPITAL_COMMUNITY): Payer: Self-pay | Admitting: *Deleted

## 2021-11-01 ENCOUNTER — Other Ambulatory Visit (HOSPITAL_COMMUNITY): Payer: Self-pay | Admitting: Psychiatry

## 2021-11-01 DIAGNOSIS — F319 Bipolar disorder, unspecified: Secondary | ICD-10-CM

## 2021-11-01 MED ORDER — LITHIUM CARBONATE 300 MG PO CAPS
ORAL_CAPSULE | ORAL | 3 refills | Status: DC
  Filled 2021-11-01: qty 90, 30d supply, fill #0

## 2021-11-01 MED ORDER — BUPROPION HCL ER (XL) 300 MG PO TB24
300.0000 mg | ORAL_TABLET | Freq: Every day | ORAL | 3 refills | Status: DC
  Filled 2021-11-01: qty 30, 30d supply, fill #0

## 2021-11-01 NOTE — Telephone Encounter (Signed)
VM from patient stating she is getting low on her Lithium and her Buspar. Reviewed record and she missed her last appt and has no future appt. She was last seen in March. Asked the front desk staff to contact her and schedule her an appt with the provider before I can ask the provider for a rx to bridge her to an appt. She has not had a rx written by this clinic since Jan 23.

## 2021-11-02 ENCOUNTER — Other Ambulatory Visit: Payer: Self-pay

## 2021-11-02 ENCOUNTER — Encounter (HOSPITAL_COMMUNITY): Payer: Self-pay | Admitting: Psychiatry

## 2021-11-02 ENCOUNTER — Telehealth (INDEPENDENT_AMBULATORY_CARE_PROVIDER_SITE_OTHER): Payer: No Payment, Other | Admitting: Psychiatry

## 2021-11-02 DIAGNOSIS — F319 Bipolar disorder, unspecified: Secondary | ICD-10-CM

## 2021-11-02 DIAGNOSIS — F411 Generalized anxiety disorder: Secondary | ICD-10-CM | POA: Diagnosis not present

## 2021-11-02 MED ORDER — BUPROPION HCL ER (XL) 300 MG PO TB24
300.0000 mg | ORAL_TABLET | Freq: Every day | ORAL | 3 refills | Status: DC
  Filled 2021-11-29: qty 30, 30d supply, fill #0
  Filled 2021-12-29: qty 30, 30d supply, fill #1
  Filled 2022-01-29: qty 30, 30d supply, fill #2

## 2021-11-02 MED ORDER — HYDROXYZINE HCL 10 MG PO TABS
5.0000 mg | ORAL_TABLET | Freq: Three times a day (TID) | ORAL | 3 refills | Status: DC | PRN
  Filled 2021-12-22 – 2021-12-29 (×3): qty 45, 30d supply, fill #0
  Filled 2022-01-29: qty 45, 30d supply, fill #1

## 2021-11-02 MED ORDER — BUSPIRONE HCL 15 MG PO TABS
15.0000 mg | ORAL_TABLET | Freq: Three times a day (TID) | ORAL | 3 refills | Status: DC
  Filled 2021-11-02: qty 90, 30d supply, fill #0

## 2021-11-02 MED ORDER — LITHIUM CARBONATE 300 MG PO CAPS
ORAL_CAPSULE | ORAL | 3 refills | Status: DC
  Filled 2021-11-29: qty 90, 30d supply, fill #0
  Filled 2021-12-29: qty 90, 30d supply, fill #1
  Filled 2022-01-29: qty 90, 30d supply, fill #2

## 2021-11-02 NOTE — Progress Notes (Signed)
[facility] MD/PA/NP OP Progress Note Virtual Visit via Telephone Note  I connected with Kristen Moore on 11/02/21 at 10:00 AM EDT by telephone and verified that I am speaking with the correct person using two identifiers.  Location: Patient: home Provider: Clinic   I discussed the limitations, risks, security and privacy concerns of performing an evaluation and management service by telephone and the availability of in person appointments. I also discussed with the patient that there may be a patient responsible charge related to this service. The patient expressed understanding and agreed to proceed.   I provided 30 minutes of non-face-to-face time during this encounter.        11/02/2021 10:33 AM Kristen Moore  MRN:  [id number]  Chief Complaint: "I am doing okay"   HPI: 51 year old female seen today for follow up psychiatric evaluation. She has a psychiatric history of anxiety, depression, and bipolar disorder. She is currently being managed on Wellbutrin XL 300 mg daily, hydroxyzine 5 mg three times daily, Buspar 10 mg three times, and lithium 300 mg in the morning and 600 mg nightly. She noted that her medications are somewhat effective in managing her psychiatric conditions.    Today patient was unable to log in virtually so her assessment was done over the phone. During exam she was pleasant, cooperative, and engaged in conversation. She informed Probation officer that her mother passed away in 09/29/2022 and her fathers health is declining. She reports that she is now trying to keep her fathers home up but is financially stressed and fears losing there home. She reports she lives with brother Kristen Moore who does not help a lot with finances and at times can be mean. She also notes that she worries about her daughter who continues to suffer from substance use. She notes that she and her younger brother have not been speaking as she notes he tries to take advantage of her. Provider gave patient  number to [facility] for rental assistance.  She endorses adequate sleep and appetite. Today she denies SI/HI/VAH, mania or paranoia.    She notes that she continues to work at [employer] and finds enjoyment in her job.  Today patient agreeable to increase Busbar 10 mg three times daily to 15 mg three times daily to help manage anxiety and depression.  Patient agreeable to continue all other medication as prescribed.  She will follow-up with outpatient counseling for therapy.  No other concerns noted at this time.   .    Visit Diagnosis:    ICD-10-CM   1. Bipolar depression (HCC)  F31.9 buPROPion (WELLBUTRIN XL) 300 MG 24 hr tablet    busPIRone (BUSPAR) 15 MG tablet    lithium carbonate 300 MG capsule    2. Generalized anxiety disorder  F41.1 busPIRone (BUSPAR) 15 MG tablet    hydrOXYzine (ATARAX) 10 MG tablet      Past Psychiatric History:  anxiety, depression, and bipolar disorder.  Past Medical History:  Past Medical History:  Diagnosis Date   Abnormal Pap smear    cryo   Anemia    Asthma    Bipolar 1 disorder ([place])    Cyst of breast    left breast   DUB (dysfunctional uterine bleeding)    Fibroids    Headache(784.0)    Hypothyroidism    IBS (irritable bowel syndrome)    Thyroid dysfunction     Past Surgical History:  Procedure Laterality Date   ABDOMINAL HYSTERECTOMY Bilateral 12/25/2012   Procedure: HYSTERECTOMY  ABDOMINAL;  Surgeon: Emily Filbert, MD;  Location: [facility];  Service: Gynecology;  Laterality: Bilateral;  TAH and bilateral salpingectomy   BILATERAL SALPINGECTOMY Bilateral 12/25/2012   Procedure: BILATERAL SALPINGECTOMY;  Surgeon: Emily Filbert, MD;  Location: [facility];  Service: Gynecology;  Laterality: Bilateral;   CERVICAL BIOPSY  W/ LOOP ELECTRODE EXCISION     CHOLECYSTECTOMY     COLON SURGERY     DILATION AND CURETTAGE OF UTERUS     OOPHORECTOMY Bilateral 12/25/2012   Procedure: OOPHORECTOMY;  Surgeon: Emily Filbert, MD;  Location: [facility];  Service:  Gynecology;  Laterality: Bilateral;   TUBAL LIGATION      Family Psychiatric History: Daughter substance abuse  Family History:  Family History  Problem Relation Age of Onset   Heart disease Father    Cancer Father 73       lung   Hypertension Mother    Rheum arthritis Mother    Fibromyalgia Mother    COPD Mother    Sleep apnea Mother    Thyroid disease Mother    Breast cancer Maternal Grandmother    Breast cancer Paternal Aunt     Social History:  Social History   Socioeconomic History   Marital status: Single    Spouse name: Not on file   Number of children: 1   Years of education: Not on file   Highest education level: Associate degree: occupational, Hotel manager, or vocational program  Occupational History   Not on file  Tobacco Use   Smoking status: Every Day    Packs/day: 0.50    Years: 9.00    Pack years: 4.50    Types: Cigarettes   Smokeless tobacco: Never  Vaping Use   Vaping Use: Never used  Substance and Sexual Activity   Alcohol use: No    Alcohol/week: 0.0 standard drinks   Drug use: No   Sexual activity: Not Currently    Birth control/protection: Surgical  Other Topics Concern   Not on file  Social History Narrative   Mother has ovarian cancer that has spread to lungs, Dad has lung cancer that has spread to kidneys, and disabled brother all live in home with patient. Patient is primary caregiver. Daughter is drug addict, and has loss custody of patient's infant granddaughter in 02/2020. Patient works as a Scientist, water quality. Patient is very stressed and saddened about her current situation.    Social Determinants of Health   Financial Resource Strain: Not on file  Food Insecurity: No Food Insecurity   Worried About Charity fundraiser in the Last Year: Never true   Ran Out of Food in the Last Year: Never true  Transportation Needs: Unmet Transportation Needs   Lack of Transportation (Medical): Yes   Lack of Transportation (Non-Medical): Yes  Physical  Activity: Not on file  Stress: Not on file  Social Connections: Not on file    Allergies:   Metabolic Disorder Labs: Lab Results  Component Value Date   HGBA1C 5.1 04/25/2020   MPG 88.19 04/18/2018   No results found for: PROLACTIN Lab Results  Component Value Date   CHOL 110 01/26/2021   TRIG 64 01/26/2021   HDL 47 01/26/2021   CHOLHDL 2.3 01/26/2021   VLDL 56 (H) 04/18/2018   LDLCALC 49 01/26/2021   LDLCALC 145 (H) 06/30/2019   Lab Results  Component Value Date   TSH 1.500 05/26/2021   TSH 0.107 (L) 01/26/2021    Therapeutic Level Labs: Lab Results  Component Value  Date   LITHIUM 0.8 01/26/2021   LITHIUM 0.3 (L) 04/30/2018   No results found for: VALPROATE No components found for:  CBMZ  Current Medications: Current Outpatient Medications  Medication Sig Dispense Refill   atorvastatin (LIPITOR) 40 MG tablet TAKE 1 TABLET (40 MG TOTAL) BY MOUTH DAILY. 30 tablet 3   buPROPion (WELLBUTRIN XL) 300 MG 24 hr tablet Take 1 tablet (300 mg total) by mouth daily. 30 tablet 3   busPIRone (BUSPAR) 15 MG tablet Take 1 tablet (15 mg total) by mouth 3 (three) times daily. 90 tablet 3   hydrOXYzine (ATARAX) 10 MG tablet Take 0.5 tablets (5 mg total) by mouth 3 (three) times daily as needed. 45 tablet 3   levothyroxine (SYNTHROID) 150 MCG tablet Take 1 tablet (150 mcg total) by mouth daily. 30 tablet 6   lithium carbonate 300 MG capsule TAKE 1 CAPSULE BY MOUTH EVERY MORNING & TAKE 2 CAPSULES EVERY NIGHT AT BEDTIME 90 capsule 3   meclizine (ANTIVERT) 25 MG tablet Take 1 tablet (25 mg total) by mouth 3 (three) times daily as needed for dizziness. 30 tablet 0   methylPREDNISolone (MEDROL DOSEPAK) 4 MG TBPK tablet Follow package insert 21 each 0   No current facility-administered medications for this visit.     Musculoskeletal: Strength & Muscle Tone:  Unable to assess due to telephone visit Gait & Station:  Unable to assess due to telephone visit Patient leans:  N/A  Psychiatric Specialty Exam: Review of Systems  Last menstrual period 11/29/2012.There is no height or weight on file to calculate BMI.  General Appearance:  Unable to assess due to telephone visit  Eye Contact:   Unable to assess due to telephone visit  Speech:  Clear and Coherent and Normal Rate  Volume:  Normal  Mood:  Anxious and Depressed,   Affect:  Appropriate and Congruent  Thought Process:  Coherent, Goal Directed and Linear  Orientation:  Full (Time, Place, and Person)  Thought Content: WDL and Logical   Suicidal Thoughts:  No  Homicidal Thoughts:  No  Memory:  Immediate;   Good Recent;   Good Remote;   Good  Judgement:  Good  Insight:  Good  Psychomotor Activity:   Unable to assess due to telephone visit  Concentration:  Concentration: Good and Attention Span: Good  Recall:  Good  Fund of Knowledge: Good  Language: Good  Akathisia:   Unable to assess due to telephone visit  Handed:  Right  AIMS (if indicated): Not done  Assets:  Communication Skills Desire for Improvement Financial Resources/Insurance Housing Social Support  ADL's:  Intact  Cognition: WNL  Sleep:  Good   Screenings: GAD-7    Flowsheet Row Office Visit from 05/26/2021 in [facility] Video Visit from 03/07/2021 in [facility] Video Visit from 12/08/2020 in [facility] Office Visit from 10/19/2020 in [facility] Video Visit from 09/09/2020 in [facility]  Total GAD-7 Score '14 15 14 12 21      '$ PHQ2-9    [facility] Office Visit from 05/26/2021 in [facility] Video Visit from 03/07/2021 in [facility] Video Visit from 12/08/2020 in [facility] Office Visit from 10/19/2020 in [facility] Video Visit from 09/09/2020 in [facility]  PHQ-2 Total Score '3 1 5 3 3  '$ PHQ-9  Total Score '9 2 10 11 11      '$ Flowsheet Row ED from 09/28/2021 in [facility] DEPT ED from 09/02/2021 in [facility]PT ED from 05/11/2021 in [facility] No Risk No Risk No Risk        Assessment and Plan: Patient endorses increased anxiety and depression due to life stressors.   Today patient agreeable to increase Busbar 10 mg three times daily to 15 mg three times daily to help manage anxiety and depression.  Patient agreeable to continue all other medication as prescribed.  1. Bipolar depression ([place])  Continue- buPROPion (WELLBUTRIN XL) 300 MG 24 hr tablet; Take 1 tablet (300 mg total) by mouth daily.  Dispense: 30 tablet; Refill: 3 Continue- lithium carbonate 300 MG capsule; Take 1-2 capsules (300-600 mg total) by mouth 2 (two) times daily with a meal. Takes 300 mg in the morning and 600 mg at bedtime  Dispense: 90 capsule; Refill: 3 Continue- busPIRone (BUSPAR) 15 MG tablet; Take 1 tablet (15 mg total) by mouth 3 (three) times daily.  Dispense: 90 tablet; Refill: 3  2. Generalized anxiety disorder  Continue- hydrOXYzine (ATARAX/VISTARIL) 10 MG tablet; Take 0.5 tablets (5 mg total) by mouth 3 (three) times daily as needed.  Dispense: 45 tablet; Refill: 3 Continue- busPIRone (BUSPAR) 15 MG tablet; Take 1 tablet (15 mg total) by mouth 3 (three) times daily.  Dispense: 90 tablet; Refill: 3  Follow up in 3 months Follow up with therapy   Salley Slaughter, NP 11/02/2021, 10:33 AM

## 2021-11-03 ENCOUNTER — Other Ambulatory Visit: Payer: Self-pay

## 2021-11-07 ENCOUNTER — Ambulatory Visit: Payer: Self-pay | Admitting: *Deleted

## 2021-11-07 NOTE — Telephone Encounter (Signed)
Noted.  Will route to PCP as Juluis Rainier

## 2021-11-07 NOTE — Telephone Encounter (Signed)
  Chief Complaint: Dizziness Symptoms: Multiple; dizziness x "Months maybe years" Intermittent, varies in intensity and duration. States seen for this, cannot take meds, "Too sleepy." BP 132/96, shoulder pain from old injury, "Bump" between teeth,possible hemorrhoids per pt, anxiety. Frequency: LAst dizzy episode yesterday. Pertinent Negatives: Patient denies  Disposition: '[]'$ ED /'[]'$ Urgent Care (no appt availability in office) / '[x]'$ Appointment(In office/virtual)/ '[]'$  [facility]/ '[]'$ Home Care/ '[]'$ Refused Recommended Disposition /'[]'$ [facility]/ '[]'$  Follow-up with PCP Additional Notes: Secured next available appt, July 5th and placed on wait list. Care advise provided, ED for worsening symptoms. Reason for Disposition  [1] MILD dizziness (e.g., vertigo; walking normally) AND [2] has been evaluated by physician for this    Occurs Intermittently for months "Maybe years"  Answer Assessment - Initial Assessment Questions 1. DESCRIPTION: "Describe your dizziness."     Off balance 2. VERTIGO: "Do you feel like either you or the room is spinning or tilting?"      Sometimes 3. LIGHTHEADED: "Do you feel lightheaded?" (e.g., somewhat faint, woozy, weak upon standing)     Yes 4. SEVERITY: "How bad is it?"  "Can you walk?"   - MILD: Feels slightly dizzy and unsteady, but is walking normally.   - MODERATE: Feels unsteady when walking, but not falling; interferes with normal activities (e.g., school, work).   - SEVERE: Unable to walk without falling, or requires assistance to walk without falling.     Varies, severe yesterday, Positional at times as well. 5. ONSET:  "When did the dizziness begin?"     Last episode at noon yesterday 6. AGGRAVATING FACTORS: "Does anything make it worse?" (e.g., standing, change in head position)     At times 7. CAUSE: "What do you think is causing the dizziness?"     Unsure 8. RECURRENT SYMPTOM: "Have you had dizziness before?" If Yes, ask: "When was the  last time?" "What happened that time?" Yes     9. OTHER SYMPTOMS: "Do you have any other symptoms?" (e.g., headache, weakness, numbness, vomiting, earache)  Protocols used: Dizziness - Vertigo-A-AH

## 2021-11-09 ENCOUNTER — Ambulatory Visit
Admission: EM | Admit: 2021-11-09 | Discharge: 2021-11-09 | Disposition: A | Discharge status: Home / Self Care | Payer: No Typology Code available for payment source | Attending: Internal Medicine | Admitting: Internal Medicine

## 2021-11-09 DIAGNOSIS — R42 Dizziness and giddiness: Secondary | ICD-10-CM

## 2021-11-09 LAB — POCT FASTING CBG KUC MANUAL ENTRY: POCT Glucose (KUC): 116 mg/dL — AB (ref 70–99)

## 2021-11-09 NOTE — ED Triage Notes (Signed)
Patient presents to Urgent Care with complaints of dizziness  since last year. Patient reports she was diagnosed with vertigo but the doctor didn't really look at her just sent her home with medication.  Pt reports she couldn't even take the medication because it makes her so sleep.  Pt reports she has a pcp and has appointment in june

## 2021-11-09 NOTE — ED Provider Notes (Signed)
[facility]    CSN: [id number] Arrival date & time: 11/09/21  1329      History   Chief Complaint Chief Complaint  Patient presents with   Dizziness    HPI Kristen Moore is a 51 y.o. female.   Patient presents with complaints of dizziness that has been present for multiple years.  Patient reports that she is very concerned about the dizziness as it appears to becoming a daily occurrence. Dizziness is not affected by position change. Patient was seen in December 2022 by PCP for evaluation of dizziness.  Had blood work completed that was unremarkable and was prescribed meclizine with no improvement in dizziness.  Patient denies any associated chest pain, shortness of breath, nausea, vomiting, abdominal pain.  Patient having normal bowel movements.  Denies blood in stool.  Denies any associated urinary symptoms.  Patient does report associated headache, blurred vision, numbness in hands since symptoms started.  Numbness in hands has been present over the past few months.  She was seen in the emergency department and was told that she had carpal tunnel syndrome but she states that she has had carpal tunnel in the past and this does not feel similar.  She scheduled an appointment with PCP on June 28 for further evaluation and management of persistent dizziness. Denies any recent medication changes. Pertinent medical history includes bipolar disorder, generalized anxiety disorder, hypothyroidism.    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   Generalized anxiety disorder 02/02/2020   Screening breast examination  05/19/2019   Bipolar depression ([place]) 05/07/2019   Elevated blood pressure reading in office without diagnosis of hypertension 08/20/2018   Hyperthyroidism 08/20/2018   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   Bilateral lower extremity edema 08/12/2017   Transition of care performed with sharing of clinical summary 08/12/2017   Decreased range of motion of right shoulder 06/13/2017   Muscle weakness of right arm 06/13/2017   Neck pain 06/13/2017   Poor posture 06/13/2017   Pain in right elbow 05/07/2017   Asymptomatic menopausal state 05/06/2017   Localized edema 05/06/2017   Major depressive disorder, single episode 05/06/2017   Pure hypercholesterolemia 05/06/2017   Sprain of left foot 12/20/2016   Rotator cuff impingement syndrome 09/03/2016   Decreased vision in both eyes 06/05/2016   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   IBS (irritable bowel syndrome) 10/21/2015   Dyshidrotic hand dermatitis 10/21/2015   Dyshidrotic foot dermatitis 10/21/2015   S/P bilateral oophorectomy 10/21/2015   Hypothyroidism 10/21/2015   HLD (hyperlipidemia) 03/18/2015   Knee pain, right 12/13/2014   Pelvic pain 12/25/2012   CHEST PAIN UNSPECIFIED 11/05/2008   ELECTROCARDIOGRAM, ABNORMAL 11/05/2008    Past Surgical History:  Procedure Laterality Date   ABDOMINAL HYSTERECTOMY Bilateral 12/25/2012   Procedure: HYSTERECTOMY ABDOMINAL;  Surgeon: Emily Filbert, MD;  Location: [facility];  Service: Gynecology;  Laterality: Bilateral;  TAH and bilateral salpingectomy   BILATERAL SALPINGECTOMY Bilateral 12/25/2012   Procedure: BILATERAL SALPINGECTOMY;  Surgeon: Emily Filbert, MD;  Location:  [facility];  Service: Gynecology;  Laterality: Bilateral;   CERVICAL BIOPSY  W/ LOOP ELECTRODE EXCISION     CHOLECYSTECTOMY     COLON SURGERY     DILATION AND CURETTAGE OF UTERUS     OOPHORECTOMY Bilateral 12/25/2012   Procedure: OOPHORECTOMY;  Surgeon: Emily Filbert, MD;  Location: [facility];  Service: Gynecology;  Laterality:  Bilateral;   TUBAL LIGATION      OB History     Gravida  3   Para  2   Term  1   Preterm  1   AB  1   Living  1      SAB      IAB  1   Ectopic      Multiple      Live Births               Home Medications    Prior to Admission medications   Medication Sig Start Date End Date Taking? Authorizing Provider  atorvastatin (LIPITOR) 40 MG tablet TAKE 1 TABLET (40 MG TOTAL) BY MOUTH DAILY. 07/31/21   Charlott Rakes, MD  buPROPion (WELLBUTRIN XL) 300 MG 24 hr tablet Take 1 tablet (300 mg total) by mouth daily. 11/02/21   Salley Slaughter, NP  busPIRone (BUSPAR) 15 MG tablet Take 1 tablet (15 mg total) by mouth 3 (three) times daily. 11/02/21   Salley Slaughter, NP  hydrOXYzine (ATARAX) 10 MG tablet Take 0.5 tablets (5 mg total) by mouth 3 (three) times daily as needed. 11/02/21   Salley Slaughter, NP  levothyroxine (SYNTHROID) 150 MCG tablet Take 1 tablet (150 mcg total) by mouth daily. 06/06/21   Salley Slaughter, NP  lithium carbonate 300 MG capsule TAKE 1 CAPSULE BY MOUTH EVERY MORNING & TAKE 2 CAPSULES EVERY NIGHT AT BEDTIME 11/02/21   Salley Slaughter, NP  meclizine (ANTIVERT) 25 MG tablet Take 1 tablet (25 mg total) by mouth 3 (three) times daily as needed for dizziness. 05/26/21   Charlott Rakes, MD  methylPREDNISolone (MEDROL DOSEPAK) 4 MG TBPK tablet Follow package insert 09/28/21   Lennice Sites, DO    Family History Family History  Problem Relation Age of Onset   Heart disease Father    Cancer Father 38       lung   Hypertension Mother    Rheum arthritis Mother    Fibromyalgia Mother    COPD Mother    Sleep apnea Mother    Thyroid disease Mother    Breast cancer Maternal Grandmother    Breast cancer Paternal Aunt     Social History Social History   Tobacco Use   Smoking status: Every Day    Packs/day: 0.50    Years: 9.00    Total pack years: 4.50    Types: Cigarettes   Smokeless tobacco: Never  Vaping Use   Vaping Use: Never used   Substance Use Topics   Alcohol use: No    Alcohol/week: 0.0 standard drinks of alcohol   Drug use: No     Allergies   Bee venom, Latex, Penicillins, Omega-3, and Other   Review of Systems Review of Systems Per HPI  Physical Exam Triage Vital Signs ED Triage Vitals  Enc Vitals Group     BP 11/09/21 1356 (!) 150/90     Pulse Rate 11/09/21 1356 97     Resp 11/09/21 1356 18     Temp 11/09/21 1356 98 F (36.7 C)     Temp src --  SpO2 11/09/21 1356 97 %     Weight --      Height --      Head Circumference --      Peak Flow --      Pain Score 11/09/21 1354 3     Pain Loc --      Pain Edu? --      Excl. in [place]? --    No data found.  Updated Vital Signs BP (!) 150/90   Pulse 97   Temp 98 F (36.7 C)   Resp 18   LMP 11/29/2012   SpO2 97%   Visual Acuity Right Eye Distance:   Left Eye Distance:   Bilateral Distance:    Right Eye Near:   Left Eye Near:    Bilateral Near:     Physical Exam Constitutional:      General: She is not in acute distress.    Appearance: Normal appearance. She is not toxic-appearing or diaphoretic.  HENT:     Head: Normocephalic and atraumatic.  Eyes:     Extraocular Movements: Extraocular movements intact.     Conjunctiva/sclera: Conjunctivae normal.     Pupils: Pupils are equal, round, and reactive to light.  Cardiovascular:     Rate and Rhythm: Normal rate and regular rhythm.     Pulses: Normal pulses.     Heart sounds: Normal heart sounds.  Pulmonary:     Effort: Pulmonary effort is normal. No respiratory distress.     Breath sounds: Normal breath sounds.  Neurological:     General: No focal deficit present.     Mental Status: She is alert and oriented to person, place, and time. Mental status is at baseline.     Cranial Nerves: Cranial nerves 2-12 are intact.     Sensory: Sensation is intact.     Motor: Motor function is intact.     Coordination: Coordination is intact.     Gait: Gait is intact.  Psychiatric:         Mood and Affect: Mood normal.        Behavior: Behavior normal.        Thought Content: Thought content normal.        Judgment: Judgment normal.      UC Treatments / Results  Labs (all labs ordered are listed, but only abnormal results are displayed) Labs Reviewed  POCT FASTING CBG [facility] MANUAL ENTRY - Abnormal; Notable for the following components:      Result Value   POCT Glucose ([facility]) 116 (*)    All other components within normal limits    EKG   Radiology No results found.  Procedures Procedures (including critical care time)  Medications Ordered in UC Medications - No data to display  Initial Impression / Assessment and Plan / UC Course  I have reviewed the triage vital signs and the nursing notes.  Pertinent labs & imaging results that were available during my care of the patient were reviewed by me and considered in my medical decision making (see chart for details).     EKG was unremarkable.  Blood glucose was unremarkable.  Do not think that any additional work-up is necessary given the patient was seen by PCP a few months prior and had blood work that was unremarkable.  Patient has appointment with PCP in a few weeks and advised patient of the importance of keeping this follow-up appointment.  Limited resources here in the urgent care to evaluate chronic dizziness given that  it has been present for multiple years.  Advised patient that it would be beneficial for patient to see neurology specialty given persistent dizziness.  She was provided with contact information for neurology specialty.  Neuro exam was normal and physical exam normal.  do not think that emergent evaluation at the hospital is necessary at this time.  Discussed strict return and ER precautions.  Patient verbalized understanding and was agreeable with plan. Final Clinical Impressions(s) / UC Diagnoses   Final diagnoses:  Dizziness and giddiness     Discharge Instructions      The EKG of your  heart was normal.  Your blood sugar was also normal.  Please keep scheduled appointment with primary care doctor for further evaluation and management.  Also recommend following up with neurology given persistent dizziness at provided contact information.    ED Prescriptions   None    PDMP not reviewed this encounter.   Teodora Medici, [place] 11/09/21 1511

## 2021-11-09 NOTE — Discharge Instructions (Addendum)
The EKG of your heart was normal.  Your blood sugar was also normal.  Please keep scheduled appointment with primary care doctor for further evaluation and management.  Also recommend following up with neurology given persistent dizziness at provided contact information.

## 2021-11-29 ENCOUNTER — Ambulatory Visit: Payer: No Typology Code available for payment source | Admitting: Physician Assistant

## 2021-11-29 ENCOUNTER — Encounter: Payer: Self-pay | Admitting: Family Medicine

## 2021-11-29 ENCOUNTER — Other Ambulatory Visit: Payer: Self-pay

## 2021-11-29 ENCOUNTER — Ambulatory Visit: Payer: Self-pay | Attending: Family Medicine | Admitting: Family Medicine

## 2021-11-29 VITALS — BP 120/78 | HR 66 | Temp 98.8°F | Ht 64.0 in | Wt 198.6 lb

## 2021-11-29 DIAGNOSIS — F17200 Nicotine dependence, unspecified, uncomplicated: Secondary | ICD-10-CM | POA: Insufficient documentation

## 2021-11-29 DIAGNOSIS — F319 Bipolar disorder, unspecified: Secondary | ICD-10-CM

## 2021-11-29 DIAGNOSIS — E7800 Pure hypercholesterolemia, unspecified: Secondary | ICD-10-CM

## 2021-11-29 DIAGNOSIS — F17210 Nicotine dependence, cigarettes, uncomplicated: Secondary | ICD-10-CM

## 2021-11-29 DIAGNOSIS — R42 Dizziness and giddiness: Secondary | ICD-10-CM

## 2021-11-29 DIAGNOSIS — E039 Hypothyroidism, unspecified: Secondary | ICD-10-CM

## 2021-11-29 MED ORDER — ATORVASTATIN CALCIUM 40 MG PO TABS
40.0000 mg | ORAL_TABLET | Freq: Every day | ORAL | 6 refills | Status: DC
  Filled 2021-11-29: qty 30, 30d supply, fill #0
  Filled 2021-12-22 – 2021-12-29 (×2): qty 30, 30d supply, fill #1
  Filled 2022-01-29: qty 30, 30d supply, fill #2
  Filled 2022-02-28: qty 30, 30d supply, fill #3
  Filled 2022-04-01: qty 30, 30d supply, fill #4
  Filled 2022-04-02: qty 30, 30d supply, fill #0
  Filled 2022-05-03: qty 30, 30d supply, fill #1
  Filled 2022-06-05 – 2022-06-08 (×2): qty 30, 30d supply, fill #2

## 2021-11-29 MED ORDER — NICOTINE 14 MG/24HR TD PT24
14.0000 mg | MEDICATED_PATCH | Freq: Every day | TRANSDERMAL | 2 refills | Status: DC
  Filled 2021-11-29: qty 28, 28d supply, fill #0
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
## 2021-11-29 NOTE — Patient Instructions (Signed)
Vertigo Vertigo is the feeling that you or the things around you are moving when they are not. This feeling can come and go at any time. Vertigo often goes away on its own. This condition can be dangerous if it happens when you are doing activities like driving or working with machines. Your doctor will do tests to find the cause of your vertigo. These tests will also help your doctor decide on the best treatment for you. Follow these instructions at home: Eating and drinking     Drink enough fluid to keep your pee (urine) pale yellow. Do not drink alcohol. Activity Return to your normal activities when your doctor says that it is safe. In the morning, first sit up on the side of the bed. When you feel okay, stand slowly while you hold onto something until you know that your balance is fine. Move slowly. Avoid sudden body or head movements or certain positions, as told by your doctor. Use a cane if you have trouble standing or walking. Sit down right away if you feel dizzy. Avoid doing any tasks or activities that can cause danger to you or others if you get dizzy. Avoid bending down if you feel dizzy. Place items in your home so that they are easy for you to reach without bending or leaning over. Do not drive or use machinery if you feel dizzy. General instructions Take over-the-counter and prescription medicines only as told by your doctor. Keep all follow-up visits. Contact a doctor if: Your medicine does not help your vertigo. Your problems get worse or you have new symptoms. You have a fever. You feel like you may vomit (nauseous), or this feeling gets worse. You start to vomit. Your family or friends see changes in how you act. You lose feeling (have numbness) in part of your body. You feel prickling and tingling in a part of your body. Get help right away if: You are always dizzy. You faint. You get very bad headaches. You get a stiff neck. Bright light starts to bother  you. You have trouble moving or talking. You feel weak in your hands, arms, or legs. You have changes in your hearing or in how you see (vision). These symptoms may be an emergency. Get help right away. Call your local emergency services (911 in the [place] Do not wait to see if the symptoms will go away. Do not drive yourself to the hospital. Summary Vertigo is the feeling that you or the things around you are moving when they are not. Your doctor will do tests to find the cause of your vertigo. You may be told to avoid some tasks, positions, or movements. Contact a doctor if your medicine is not helping, or if you have a fever, new symptoms, or a change in how you act. Get help right away if you get very bad headaches, or if you have changes in how you speak, hear, or see. This information is not intended to replace advice given to you by your health care provider. Make sure you discuss any questions you have with your health care provider. Document Revised: 04/20/2020 Document Reviewed: 04/20/2020 Elsevier Patient Education  2023 [employer]  

## 2021-11-30 ENCOUNTER — Other Ambulatory Visit: Payer: Self-pay

## 2021-11-30 ENCOUNTER — Ambulatory Visit: Payer: Self-pay | Attending: Family Medicine

## 2021-11-30 DIAGNOSIS — E7800 Pure hypercholesterolemia, unspecified: Secondary | ICD-10-CM

## 2021-11-30 DIAGNOSIS — E039 Hypothyroidism, unspecified: Secondary | ICD-10-CM

## 2021-12-01 ENCOUNTER — Encounter: Payer: Self-pay | Admitting: Family Medicine

## 2021-12-01 ENCOUNTER — Other Ambulatory Visit: Payer: Self-pay | Admitting: Family Medicine

## 2021-12-01 ENCOUNTER — Other Ambulatory Visit: Payer: Self-pay

## 2021-12-01 DIAGNOSIS — E039 Hypothyroidism, unspecified: Secondary | ICD-10-CM

## 2021-12-01 LAB — CMP14+EGFR
ALT: 17 IU/L (ref 0–32)
AST: 16 IU/L (ref 0–40)
Albumin/Globulin Ratio: 2.3 — ABNORMAL HIGH (ref 1.2–2.2)
Albumin: 4.5 g/dL (ref 3.8–4.8)
Alkaline Phosphatase: 74 IU/L (ref 44–121)
BUN/Creatinine Ratio: 17 (ref 9–23)
BUN: 14 mg/dL (ref 6–24)
Bilirubin Total: 0.6 mg/dL (ref 0.0–1.2)
CO2: 22 mmol/L (ref 20–29)
Calcium: 9.8 mg/dL (ref 8.7–10.2)
Chloride: 105 mmol/L (ref 96–106)
Creatinine, Ser: 0.81 mg/dL (ref 0.57–1.00)
Globulin, Total: 2 g/dL (ref 1.5–4.5)
Glucose: 90 mg/dL (ref 70–99)
Potassium: 4.2 mmol/L (ref 3.5–5.2)
Sodium: 139 mmol/L (ref 134–144)
Total Protein: 6.5 g/dL (ref 6.0–8.5)
eGFR: 89 mL/min/{1.73_m2} (ref >59)

## 2021-12-01 LAB — LP+NON-HDL CHOLESTEROL
Cholesterol, Total: 112 mg/dL (ref 100–199)
HDL: 44 mg/dL (ref >39)
LDL Chol Calc (NIH): 53 mg/dL (ref 0–99)
Total Non-HDL-Chol (LDL+VLDL): 68 mg/dL (ref 0–129)
Triglycerides: 74 mg/dL (ref 0–149)
VLDL Cholesterol Cal: 15 mg/dL (ref 5–40)

## 2021-12-01 LAB — TSH: TSH: 2.04 u[IU]/mL (ref 0.450–4.500)

## 2021-12-01 LAB — T3, FREE: T3, Free: 2.6 pg/mL (ref 2.0–4.4)

## 2021-12-01 LAB — T4, FREE: Free T4: 1.46 ng/dL (ref 0.82–1.77)

## 2021-12-01 MED ORDER — LEVOTHYROXINE SODIUM 150 MCG PO TABS
150.0000 ug | ORAL_TABLET | Freq: Every day | ORAL | 6 refills | Status: DC
  Filled 2021-12-01 – 2021-12-29 (×2): qty 30, 30d supply, fill #0
  Filled 2022-01-29: qty 30, 30d supply, fill #1
  Filled 2022-02-28: qty 30, 30d supply, fill #2
  Filled 2022-04-01: qty 30, 30d supply, fill #3
  Filled 2022-04-02: qty 30, 30d supply, fill #0
  Filled 2022-05-03: qty 30, 30d supply, fill #1

## 2021-12-06 ENCOUNTER — Ambulatory Visit: Payer: No Typology Code available for payment source | Admitting: Physician Assistant

## 2021-12-18 ENCOUNTER — Ambulatory Visit: Payer: Self-pay | Admitting: Physical Therapy

## 2021-12-20 ENCOUNTER — Encounter: Payer: Self-pay | Admitting: Physical Therapy

## 2021-12-21 ENCOUNTER — Ambulatory Visit: Payer: No Typology Code available for payment source | Admitting: Physician Assistant

## 2021-12-22 ENCOUNTER — Other Ambulatory Visit: Payer: Self-pay

## 2021-12-24 ENCOUNTER — Emergency Department (HOSPITAL_COMMUNITY)
Admission: EM | Admit: 2021-12-24 | Discharge: 2021-12-24 | Disposition: A | Discharge status: Home / Self Care | Payer: No Typology Code available for payment source | Attending: Emergency Medicine | Admitting: Emergency Medicine

## 2021-12-24 ENCOUNTER — Encounter (HOSPITAL_COMMUNITY): Payer: Self-pay

## 2021-12-24 ENCOUNTER — Other Ambulatory Visit: Payer: Self-pay

## 2021-12-24 DIAGNOSIS — Z91040 Latex allergy status: Secondary | ICD-10-CM | POA: Insufficient documentation

## 2021-12-24 DIAGNOSIS — T2121XA Burn of second degree of chest wall, initial encounter: Secondary | ICD-10-CM | POA: Insufficient documentation

## 2021-12-24 DIAGNOSIS — T300 Burn of unspecified body region, unspecified degree: Secondary | ICD-10-CM

## 2021-12-24 DIAGNOSIS — X101XXA Contact with hot food, initial encounter: Secondary | ICD-10-CM | POA: Insufficient documentation

## 2021-12-24 MED ORDER — DOXYCYCLINE HYCLATE 100 MG PO TABS
100.0000 mg | ORAL_TABLET | Freq: Once | ORAL | Status: AC
  Administered 2021-12-24: 100 mg via ORAL
  Filled 2021-12-24: qty 1

## 2021-12-24 MED ORDER — DOXYCYCLINE HYCLATE 100 MG PO TABS
100.0000 mg | ORAL_TABLET | Freq: Two times a day (BID) | ORAL | 0 refills | Status: DC
  Filled 2021-12-24: qty 20, 10d supply, fill #0

## 2021-12-24 MED ORDER — BACITRACIN ZINC 500 UNIT/GM EX OINT
TOPICAL_OINTMENT | CUTANEOUS | Status: AC
  Administered 2021-12-24: 1
  Filled 2021-12-24: qty 0.9
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
## 2021-12-24 NOTE — ED Provider Notes (Signed)
[facility] DEPT Provider Note   CSN: [id number] Arrival date & time: 12/24/21  1039     History  Chief Complaint  Patient presents with   Burn    Kristen Moore is a 51 y.o. female presenting after burn that occurred on July 12.  Says that she works at [employer] and was using a hot house that popped up onto her right breast.  The next day she developed blisters that have since popped.  Has been using burn cream and Tylenol with some relief but now she is noticing some green discharge and believes it is infected.   Burn      Home Medications Prior to Admission medications   Medication Sig Start Date End Date Taking? Authorizing Provider  doxycycline (VIBRAMYCIN) 100 MG capsule Take 1 capsule (100 mg total) by mouth 2 (two) times daily. 12/24/21  Yes Sharis Keeran A, PA-C  atorvastatin (LIPITOR) 40 MG tablet TAKE 1 TABLET (40 MG TOTAL) BY MOUTH DAILY. 11/29/21   Charlott Rakes, MD  buPROPion (WELLBUTRIN XL) 300 MG 24 hr tablet Take 1 tablet (300 mg total) by mouth daily. 11/02/21   Salley Slaughter, NP  busPIRone (BUSPAR) 15 MG tablet Take 1 tablet (15 mg total) by mouth 3 (three) times daily. 11/02/21   Salley Slaughter, NP  hydrOXYzine (ATARAX) 10 MG tablet Take 0.5 tablets (5 mg total) by mouth 3 (three) times daily as needed. 11/02/21   Salley Slaughter, NP  levothyroxine (SYNTHROID) 150 MCG tablet Take 1 tablet (150 mcg total) by mouth daily. 12/01/21   Charlott Rakes, MD  lithium carbonate 300 MG capsule TAKE 1 CAPSULE BY MOUTH EVERY MORNING & TAKE 2 CAPSULES EVERY NIGHT AT BEDTIME 11/02/21   Salley Slaughter, NP  meclizine (ANTIVERT) 25 MG tablet Take 1 tablet (25 mg total) by mouth 3 (three) times daily as needed for dizziness. Patient not taking: Reported on 11/29/2021 05/26/21   Charlott Rakes, MD  methylPREDNISolone (MEDROL DOSEPAK) 4 MG TBPK tablet Follow package insert 09/28/21   Curatolo, Adam, DO  nicotine (NICODERM CQ) 14  mg/24hr patch Place 1 patch (14 mg total) onto the skin daily. Then decrease to 7 mg per 24 hours 11/29/21   Charlott Rakes, MD      Allergies    Bee venom, Latex, Penicillins, Omega-3, and Other    Review of Systems   Review of Systems  Physical Exam Updated Vital Signs BP 130/78 (BP Location: Left Arm)   Pulse 63   Temp 97.9 F (36.6 C) (Oral)   Resp 18   Ht '5\' 4"'$  (1.626 m)   Wt 88.5 kg   LMP 11/29/2012   SpO2 98%   BMI 33.47 kg/m  Physical Exam Vitals and nursing note reviewed.  Constitutional:      Appearance: Normal appearance.  HENT:     Head: Normocephalic and atraumatic.  Eyes:     General: No scleral icterus.    Conjunctiva/sclera: Conjunctivae normal.  Pulmonary:     Effort: Pulmonary effort is normal. No respiratory distress.  Skin:    Findings: No rash.     Comments: 2 cm erythematous area on the inferior right breast.  Some associated yellow/green discharge.  No palpable masses in the breast.  No nipple discharge  Neurological:     Mental Status: She is alert.  Psychiatric:        Mood and Affect: Mood normal.     ED Results / Procedures / Treatments  Labs (all labs ordered are listed, but only abnormal results are displayed) Labs Reviewed - No data to display  EKG None  Radiology No results found.  Procedures Procedures   Medications Ordered in ED Medications  doxycycline (VIBRA-TABS) tablet 100 mg (has no administration in time range)    ED Course/ Medical Decision Making/ A&P                           Medical Decision Making Risk Prescription drug management.   51 year old female presenting today with the complaint of a burn.  Physical exam consistent with a second-degree burn.  Do think she would benefit from doxycycline due to her penicillin allergy.  She will use topical bacitracin or Neosporin for infection.  She will wear a bra that is supportive and cover her burn with gauze and ointment.  She is agreeable to this plan.   Afebrile, does not appear volume down, burn is less than 2% of her body surface and I believe she is stable for discharge home.  She is agreeable.   Final Clinical Impression(s) / ED Diagnoses Final diagnoses:  Burn    Rx / DC Orders ED Discharge Orders          Ordered    doxycycline (VIBRAMYCIN) 100 MG capsule  2 times daily        12/24/21 1134           Results and diagnoses were explained to the patient. Return precautions discussed in full. Patient had no additional questions and expressed complete understanding.   This chart was dictated using voice recognition software.  Despite best efforts to proofread,  errors can occur which can change the documentation meaning.    Rhae Hammock, PA-C 12/24/21 1141    Daleen Bo, MD 12/24/21 [phone]

## 2021-12-24 NOTE — Discharge Instructions (Signed)
Use Neosporin or bacitracin on your burn.  Cover with gauze while you are at work.  Take the antibiotics as prescribed.  They may upset your stomach so take them with food.  See your primary care in a couple of weeks if you have any further concerns about your healing.  Return to the ED with any fevers, chills or overall worsening of your symptoms.

## 2021-12-25 ENCOUNTER — Other Ambulatory Visit: Payer: Self-pay

## 2021-12-29 ENCOUNTER — Other Ambulatory Visit: Payer: Self-pay

## 2022-01-05 ENCOUNTER — Telehealth: Payer: Self-pay | Admitting: Family Medicine

## 2022-01-05 NOTE — Telephone Encounter (Signed)
Referral Request - Has patient seen PCP for this complaint? yes *If NO, is insurance requiring patient see PCP for this issue before PCP can refer them? Referral for which specialty: gastroenterolgist Preferred provider/office: N/A Reason for referral:  pain in buttocks at times and white fluid and blood comes out when using the bathroom, the last time this happened was last month

## 2022-01-05 NOTE — Telephone Encounter (Signed)
Is she sure she wants to see a Copywriter, advertising as a Education officer, environmental who specializes in the rectum might be the Specialist she needs to see. Let me know.

## 2022-01-10 ENCOUNTER — Other Ambulatory Visit: Payer: Self-pay | Admitting: Family Medicine

## 2022-01-10 ENCOUNTER — Encounter: Payer: Self-pay | Admitting: Family Medicine

## 2022-01-10 DIAGNOSIS — Z1211 Encounter for screening for malignant neoplasm of colon: Secondary | ICD-10-CM

## 2022-01-10 NOTE — Telephone Encounter (Signed)
Thank you.  I have sent her a MyChart message.

## 2022-01-26 ENCOUNTER — Encounter: Payer: Self-pay | Admitting: Gastroenterology

## 2022-01-29 ENCOUNTER — Other Ambulatory Visit: Payer: Self-pay

## 2022-02-01 ENCOUNTER — Encounter (HOSPITAL_COMMUNITY): Payer: Self-pay | Admitting: Psychiatry

## 2022-02-01 ENCOUNTER — Other Ambulatory Visit: Payer: Self-pay

## 2022-02-01 ENCOUNTER — Telehealth (INDEPENDENT_AMBULATORY_CARE_PROVIDER_SITE_OTHER): Payer: No Payment, Other | Admitting: Psychiatry

## 2022-02-01 ENCOUNTER — Telehealth (HOSPITAL_COMMUNITY): Payer: Self-pay | Admitting: *Deleted

## 2022-02-01 DIAGNOSIS — F17210 Nicotine dependence, cigarettes, uncomplicated: Secondary | ICD-10-CM | POA: Diagnosis not present

## 2022-02-01 DIAGNOSIS — F319 Bipolar disorder, unspecified: Secondary | ICD-10-CM

## 2022-02-01 DIAGNOSIS — F3130 Bipolar disorder, current episode depressed, mild or moderate severity, unspecified: Secondary | ICD-10-CM

## 2022-02-01 DIAGNOSIS — F411 Generalized anxiety disorder: Secondary | ICD-10-CM | POA: Diagnosis not present

## 2022-02-01 MED ORDER — CARIPRAZINE HCL 1.5 MG PO CAPS
1.5000 mg | ORAL_CAPSULE | Freq: Every day | ORAL | 3 refills | Status: DC
  Filled 2022-02-01 (×2): qty 30, 30d supply, fill #0
  Filled 2022-02-28: qty 30, 30d supply, fill #1

## 2022-02-01 MED ORDER — NICOTINE 14 MG/24HR TD PT24
14.0000 mg | MEDICATED_PATCH | Freq: Every day | TRANSDERMAL | 2 refills | Status: DC
  Filled 2022-02-01: qty 28, 28d supply, fill #0

## 2022-02-01 MED ORDER — BUSPIRONE HCL 10 MG PO TABS
10.0000 mg | ORAL_TABLET | Freq: Three times a day (TID) | ORAL | 3 refills | Status: DC
  Filled 2022-02-01: qty 90, 30d supply, fill #0

## 2022-02-01 MED ORDER — HYDROXYZINE HCL 10 MG PO TABS
5.0000 mg | ORAL_TABLET | Freq: Three times a day (TID) | ORAL | 3 refills | Status: DC | PRN
  Filled 2022-02-28: qty 45, 30d supply, fill #0
  Filled 2022-05-03: qty 45, 30d supply, fill #1
  Filled 2022-07-10 – 2022-07-16 (×2): qty 45, 30d supply, fill #2

## 2022-02-01 MED ORDER — LITHIUM CARBONATE 300 MG PO CAPS
ORAL_CAPSULE | ORAL | 3 refills | Status: DC
  Filled 2022-02-28: qty 90, 30d supply, fill #0
  Filled 2022-04-01: qty 90, 30d supply, fill #1
  Filled 2022-04-02: qty 90, 30d supply, fill #0
  Filled 2022-05-03: qty 90, 30d supply, fill #1

## 2022-02-01 MED ORDER — BUPROPION HCL ER (XL) 300 MG PO TB24
300.0000 mg | ORAL_TABLET | Freq: Every day | ORAL | 3 refills | Status: DC
  Filled 2022-02-28: qty 30, 30d supply, fill #0
  Filled 2022-04-01: qty 30, 30d supply, fill #1
  Filled 2022-04-02: qty 30, 30d supply, fill #0
  Filled 2022-05-03: qty 30, 30d supply, fill #1
  Filled 2022-06-05 – 2022-06-08 (×2): qty 30, 30d supply, fill #2

## 2022-02-01 NOTE — Telephone Encounter (Signed)
Prior authorization done on patients Vraylar. PA approved, #[id number] and its effective till 02/01/23. Notified [employer]

## 2022-02-01 NOTE — Progress Notes (Signed)
[facility] MD/PA/NP OP Progress Note Virtual Visit via Telephone Note  I connected with Kristen Moore on 02/01/22 at  9:30 AM EDT by telephone and verified that I am speaking with the correct person using two identifiers.  Location: Patient: home Provider: Clinic   I discussed the limitations, risks, security and privacy concerns of performing an evaluation and management service by telephone and the availability of in person appointments. I also discussed with the patient that there may be a patient responsible charge related to this service. The patient expressed understanding and agreed to proceed.   I provided 30 minutes of non-face-to-face time during this encounter.        02/01/2022 9:38 AM Kristen Moore  MRN:  [id number]  Chief Complaint: "I live in anxiety world"   HPI: 51 year old female seen today for follow up psychiatric evaluation. She has a psychiatric history of anxiety, depression, and bipolar disorder. She is currently being managed on Wellbutrin XL 300 mg daily, hydroxyzine 5 mg three times daily, NicoDerm CQ 14 mg patches, Buspar 15 mg three times, and lithium 300 mg in the morning and 600 mg nightly. She noted that her medications are somewhat effective in managing her psychiatric conditions.    Today patient was unable to log in virtually so her assessment was done over the phone. During exam she was pleasant, cooperative, and engaged in conversation. She informed Probation officer that she lives in a world of anxiety. She not that she constantly worries about her daughter who continues to misuse illegal substances. She also notes that her fathers cancer has passed metastasized. She notes that her brother has a job but still does little for her household. She notes that work at [employer] is stressful because she cant relate to people. She reports that she isolates from family but notes that she wants to be more social. Provider recommended patient doing activities at the  [facility]. She reports that she may look into it. Today provider conducted a GAD-7 and patient scored a 15.  Patient informed Probation officer that BuSpar at its higher dose makes her somewhat dizzy and requested that it be lowered.  Provider also conducted PHQ-9 and patient scored a 16.  She endorses having a good appetite and sleep.   Patient informed writer that she has fluctuations in mood, increased irritability, distractibility, and racing thoughts.  She informed Probation officer that she would like to try another medication to help manage her mood.  Provider discussed Abilify, Vraylar, and gabapentin. Patient informed writer that on a few occasion she saw her deceased mother or heard her voice. Today she denies SI/HI/VAH, mania, or paranoia.  Today patient is agreeable to starting Vraylar 1.5 mg to help manage mood. Potential side effects of medication and risks vs benefits of treatment vs non-treatment were explained and discussed. All questions were answered.  Today BuSpar lowered from 15 mg 3 times daily to 10 mg 3 times daily to reduce dizziness.  She will continue her other medications as prescribed.  No other concerns at this time.     Visit Diagnosis:  No diagnosis found.   Past Psychiatric History:  anxiety, depression, and bipolar disorder.  Past Medical History:  Past Medical History:  Diagnosis Date   Abnormal Pap smear    cryo   Anemia    Asthma    Bipolar 1 disorder ([place])    Cyst of breast    left breast   DUB (dysfunctional uterine bleeding)    Fibroids  Headache(784.0)    Hypothyroidism    IBS (irritable bowel syndrome)    Thyroid dysfunction     Past Surgical History:  Procedure Laterality Date   ABDOMINAL HYSTERECTOMY Bilateral 12/25/2012   Procedure: HYSTERECTOMY ABDOMINAL;  Surgeon: Emily Filbert, MD;  Location: [facility];  Service: Gynecology;  Laterality: Bilateral;  TAH and bilateral salpingectomy   BILATERAL SALPINGECTOMY Bilateral 12/25/2012   Procedure: BILATERAL SALPINGECTOMY;   Surgeon: Emily Filbert, MD;  Location: [facility];  Service: Gynecology;  Laterality: Bilateral;   CERVICAL BIOPSY  W/ LOOP ELECTRODE EXCISION     CHOLECYSTECTOMY     COLON SURGERY     DILATION AND CURETTAGE OF UTERUS     OOPHORECTOMY Bilateral 12/25/2012   Procedure: OOPHORECTOMY;  Surgeon: Emily Filbert, MD;  Location: [facility];  Service: Gynecology;  Laterality: Bilateral;   TUBAL LIGATION      Family Psychiatric History: Daughter substance abuse  Family History:  Family History  Problem Relation Age of Onset   Heart disease Father    Cancer Father 88       lung   Hypertension Mother    Rheum arthritis Mother    Fibromyalgia Mother    COPD Mother    Sleep apnea Mother    Thyroid disease Mother    Breast cancer Maternal Grandmother    Breast cancer Paternal Aunt     Social History:  Social History   Socioeconomic History   Marital status: Single    Spouse name: Not on file   Number of children: 1   Years of education: Not on file   Highest education level: Associate degree: occupational, Hotel manager, or vocational program  Occupational History   Not on file  Tobacco Use   Smoking status: Every Day    Packs/day: 0.50    Years: 9.00    Total pack years: 4.50    Types: Cigarettes   Smokeless tobacco: Never  Vaping Use   Vaping Use: Never used  Substance and Sexual Activity   Alcohol use: No    Alcohol/week: 0.0 standard drinks of alcohol   Drug use: No   Sexual activity: Not Currently    Birth control/protection: Surgical  Other Topics Concern   Not on file  Social History Narrative   Mother has ovarian cancer that has spread to lungs, Dad has lung cancer that has spread to kidneys, and disabled brother all live in home with patient. Patient is primary caregiver. Daughter is drug addict, and has loss custody of patient's infant granddaughter in 02/2020. Patient works as a Scientist, water quality. Patient is very stressed and saddened about her current situation.    Social Determinants of  Health   Financial Resource Strain: Not on file  Food Insecurity: No Food Insecurity (06/20/2021)   Hunger Vital Sign    Worried About Running Out of Food in the Last Year: Never true    Ran Out of Food in the Last Year: Never true  Transportation Needs: Unmet Transportation Needs (06/20/2021)   PRAPARE - Hydrologist (Medical): Yes    Lack of Transportation (Non-Medical): Yes  Physical Activity: Not on file  Stress: Not on file  Social Connections: Not on file    Allergies:   Metabolic Disorder Labs: Lab Results  Component Value Date   HGBA1C 5.1 04/25/2020   MPG 88.19 04/18/2018   No results found for: "PROLACTIN" Lab Results  Component Value Date   CHOL 112 11/30/2021   TRIG 74 11/30/2021  HDL 44 11/30/2021   CHOLHDL 2.3 01/26/2021   VLDL 56 (H) 04/18/2018   LDLCALC 53 11/30/2021   LDLCALC 49 01/26/2021   Lab Results  Component Value Date   TSH 2.040 11/30/2021   TSH 1.500 05/26/2021    Therapeutic Level Labs: Lab Results  Component Value Date   LITHIUM 0.8 01/26/2021   LITHIUM 0.3 (L) 04/30/2018   No results found for: "VALPROATE" No results found for: "CBMZ"  Current Medications: Current Outpatient Medications  Medication Sig Dispense Refill   atorvastatin (LIPITOR) 40 MG tablet TAKE 1 TABLET (40 MG TOTAL) BY MOUTH DAILY. 30 tablet 6   buPROPion (WELLBUTRIN XL) 300 MG 24 hr tablet Take 1 tablet (300 mg total) by mouth daily. 30 tablet 3   busPIRone (BUSPAR) 15 MG tablet Take 1 tablet (15 mg total) by mouth 3 (three) times daily. 90 tablet 3   doxycycline (VIBRA-TABS) 100 MG tablet Take 1 tablet (100 mg total) by mouth 2 (two) times daily. 20 tablet 0   hydrOXYzine (ATARAX) 10 MG tablet Take 0.5 tablets (5 mg total) by mouth 3 (three) times daily as needed. 45 tablet 3   levothyroxine (SYNTHROID) 150 MCG tablet Take 1 tablet (150 mcg total) by mouth daily. 30 tablet 6   lithium carbonate 300 MG capsule TAKE 1 CAPSULE BY MOUTH  EVERY MORNING & TAKE 2 CAPSULES EVERY NIGHT AT BEDTIME 90 capsule 3   meclizine (ANTIVERT) 25 MG tablet Take 1 tablet (25 mg total) by mouth 3 (three) times daily as needed for dizziness. (Patient not taking: Reported on 11/29/2021) 30 tablet 0   methylPREDNISolone (MEDROL DOSEPAK) 4 MG TBPK tablet Follow package insert 21 each 0   nicotine (NICODERM CQ) 14 mg/24hr patch Place 1 patch (14 mg total) onto the skin daily. Then decrease to 7 mg per 24 hours 28 patch 2   No current facility-administered medications for this visit.     Musculoskeletal: Strength & Muscle Tone:  Unable to assess due to telephone visit Gait & Station:  Unable to assess due to telephone visit Patient leans: N/A  Psychiatric Specialty Exam: Review of Systems  Last menstrual period 11/29/2012.There is no height or weight on file to calculate BMI.  General Appearance:  Unable to assess due to telephone visit  Eye Contact:   Unable to assess due to telephone visit  Speech:  Clear and Coherent and Normal Rate  Volume:  Normal  Mood:  Anxious and Depressed,   Affect:  Appropriate and Congruent  Thought Process:  Coherent, Goal Directed and Linear  Orientation:  Full (Time, Place, and Person)  Thought Content: WDL and Logical   Suicidal Thoughts:  No  Homicidal Thoughts:  No  Memory:  Immediate;   Good Recent;   Good Remote;   Good  Judgement:  Good  Insight:  Good  Psychomotor Activity:   Unable to assess due to telephone visit  Concentration:  Concentration: Good and Attention Span: Good  Recall:  Good  Fund of Knowledge: Good  Language: Good  Akathisia:   Unable to assess due to telephone visit  Handed:  Right  AIMS (if indicated): Not done  Assets:  Communication Skills Desire for Improvement Financial Resources/Insurance Housing Social Support  ADL's:  Intact  Cognition: WNL  Sleep:  Good   Screenings: GAD-7    Flowsheet Row Office Visit from 11/29/2021 in [facility] Office Visit from 05/26/2021 in [facility] Video Visit from 03/07/2021  in [facility] Video Visit from 12/08/2020 in [facility] Office Visit from 10/19/2020 in [facility]  Total GAD-7 Score '13 14 15 14 12      '$ PHQ2-9    [place] Office Visit from 11/29/2021 in [facility] Office Visit from 05/26/2021 in [facility] [place] Video Visit from 03/07/2021 in [facility] Video Visit from 12/08/2020 in [facility] Office Visit from 10/19/2020 in [facility]  PHQ-2 Total Score '2 3 1 5 3  '$ PHQ-9 Total Score '9 9 2 10 11      '$ Flowsheet Row ED from 12/24/2021 in [facility]PT ED from 11/09/2021 in [facility]  ED from 09/28/2021 in [facility] [place] [facility] DEPT  C-SSRS RISK CATEGORY No Risk No Risk No Risk        Assessment and Plan: Patient endorses increased hypomania, anxiety and depression due to life stressors.  She also informed writer that she has been dizzy with the increased dose of BuSpar.  Today patient is agreeable to starting Vraylar 1.5 mg to help manage mood. Today BuSpar lowered from 15 mg 3 times daily to 10 mg 3 times daily to reduce dizziness.   She will continue her other medications as prescribed.  No other concerns at this time.  1. Bipolar depression ([place])  Continue- buPROPion (WELLBUTRIN XL) 300 MG 24 hr tablet; Take 1 tablet (300 mg total) by mouth daily.  Dispense: 30 tablet; Refill: 3 Reduce- busPIRone (BUSPAR) 10 MG tablet; Take 1 tablet (10 mg total) by mouth 3 (three) times daily.  Dispense: 90 tablet; Refill: 3 Continue- lithium carbonate 300 MG capsule; TAKE 1 CAPSULE BY MOUTH EVERY MORNING & TAKE 2 CAPSULES EVERY NIGHT AT BEDTIME   Dispense: 90 capsule; Refill: 3 Start- cariprazine (VRAYLAR) 1.5 MG capsule; Take 1 capsule (1.5 mg total) by mouth daily.  Dispense: 30 capsule; Refill: 3  2. Generalized anxiety disorder  Reduce- busPIRone (BUSPAR) 10 MG tablet; Take 1 tablet (10 mg total) by mouth 3 (three) times daily.  Dispense: 90 tablet; Refill: 3 Continue- hydrOXYzine (ATARAX) 10 MG tablet; Take 0.5 tablets (5 mg total) by mouth 3 (three) times daily as needed.  Dispense: 45 tablet; Refill: 3  3. Cigarette nicotine dependence without complication  Continue- nicotine (NICODERM CQ) 14 mg/24hr patch; Place 1 patch (14 mg total) onto the skin daily. Then decrease to 7 mg per 24 hours  Dispense: 28 patch; Refill: 2  Follow up in 3 months Follow up with therapy   Salley Slaughter, NP 02/01/2022, 9:38 AM

## 2022-02-02 ENCOUNTER — Other Ambulatory Visit: Payer: Self-pay

## 2022-02-07 ENCOUNTER — Ambulatory Visit (AMBULATORY_SURGERY_CENTER): Payer: Self-pay | Admitting: *Deleted

## 2022-02-07 ENCOUNTER — Other Ambulatory Visit: Payer: Self-pay

## 2022-02-07 VITALS — Ht 64.0 in | Wt 194.0 lb

## 2022-02-07 DIAGNOSIS — Z1211 Encounter for screening for malignant neoplasm of colon: Secondary | ICD-10-CM

## 2022-02-07 MED ORDER — NA SULFATE-K SULFATE-MG SULF 17.5-3.13-1.6 GM/177ML PO SOLN
1.0000 | ORAL | 0 refills | Status: DC
  Filled 2022-02-07: qty 354, 1d supply, fill #0
  Filled 2022-02-16: qty 354, 30d supply, fill #0

## 2022-02-07 NOTE — Progress Notes (Signed)
Patient and family member is here in-person for PV. Patient denies any allergies to eggs or soy. Patient denies any problems with anesthesia/sedation. Patient is not on any oxygen at home. Patient is not taking any diet/weight loss medications or blood thinners. Went over procedure prep instructions with the patient. Patient is aware of our care-partner policy. Patient denies constipation.

## 2022-02-13 ENCOUNTER — Other Ambulatory Visit: Payer: Self-pay

## 2022-02-16 ENCOUNTER — Other Ambulatory Visit: Payer: Self-pay

## 2022-02-20 ENCOUNTER — Encounter: Payer: Self-pay | Admitting: Gastroenterology

## 2022-02-22 ENCOUNTER — Ambulatory Visit: Payer: Self-pay | Admitting: *Deleted

## 2022-02-22 NOTE — Telephone Encounter (Signed)
  Chief Complaint: swelling under arms Symptoms: bilateral swelling under arms, swelling in feet and legs Frequency: 1-2 months Pertinent Negatives: Patient denies fever, SOB, chest pain Disposition: '[]'$ ED /'[x]'$ Urgent Care (no appt availability in office) / '[]'$ Appointment(In office/virtual)/ '[]'$  [facility]/ '[]'$ Home Care/ '[]'$ Refused Recommended Disposition /'[]'$ [facility]/ '[]'$  Follow-up with PCP Additional Notes: Patient has multiple complaints- swollen lymph nodes, edema- bilateral legs/feet, lethargy and lack of energy, some time feels "out of it". Message sent to office requesting appointment- patient advised UC if she has not been contacted within 24 hours

## 2022-02-22 NOTE — Telephone Encounter (Signed)
Summary: Left & Right Breast knots advise   Pt is calling to report swollen knots on left and right breast. No available appts. Please advise      Reason for Disposition  [1] Very tender to the touch AND [2] no fever  [1] MODERATE leg swelling (e.g., swelling extends up to knees) AND [2] new-onset or worsening  Answer Assessment - Initial Assessment Questions 1. LOCATION: "Where is the swollen node located?" "Is the matching node on the other side of the body also swollen?"      Both- R larger 2. SIZE: "How big is the node?" (e.g., inches or centimeters; or compared to common objects such as pea, bean, marble, golf ball)      Long as finger 3. ONSET: "When did the swelling start?"      2 months 4. NECK NODES: "Is there a sore throat, runny nose or other symptoms of a cold?"      no 5. GROIN OR ARMPIT NODES: "Is there a sore, scratch, cut or painful red area on that arm or leg?"      no 6. FEVER: "Do you have a fever?" If Yes, ask: "What is it, how was it measured, and when did it start?"      no 7. CAUSE: "What do you think is causing the swollen lymph nodes?"     unsure 8. OTHER SYMPTOMS: "Do you have any other symptoms?"     Soreness- in nodes, swelling in legs at times 9. PREGNANCY: "Is there any chance you are pregnant?" "When was your last menstrual period?"  Answer Assessment - Initial Assessment Questions 1. ONSET: "When did the swelling start?" (e.g., minutes, hours, days)     2 months 2. LOCATION: "What part of the leg is swollen?"  "Are both legs swollen or just one leg?"     Up into calf- both sides 3. SEVERITY: "How bad is the swelling?" (e.g., localized; mild, moderate, severe)   - Localized: Small area of swelling localized to one leg.   - MILD pedal edema: Swelling limited to foot and ankle, pitting edema < 1/4 inch (6 mm) deep, rest and elevation eliminate most or all swelling.   - MODERATE edema: Swelling of lower leg to knee, pitting edema > 1/4 inch (6 mm) deep,  rest and elevation only partially reduce swelling.   - SEVERE edema: Swelling extends above knee, facial or hand swelling present.      Mild/moderate 4. REDNESS: "Does the swelling look red or infected?"     Yes- mostly in left leg 5. PAIN: "Is the swelling painful to touch?" If Yes, ask: "How painful is it?"   (Scale 1-10; mild, moderate or severe)     Knee pain, not a lot with leg swelling 6. FEVER: "Do you have a fever?" If Yes, ask: "What is it, how was it measured, and when did it start?"      no 7. CAUSE: "What do you think is causing the leg swelling?"     unsure 8. MEDICAL HISTORY: "Do you have a history of blood clots (e.g., DVT), cancer, heart failure, kidney disease, or liver failure?"     no 9. RECURRENT SYMPTOM: "Have you had leg swelling before?" If Yes, ask: "When was the last time?" "What happened that time?"     2019- no diagnosis 10. OTHER SYMPTOMS: "Do you have any other symptoms?" (e.g., chest pain, difficulty breathing)       Some chest pain at times 11. PREGNANCY: "Is  there any chance you are pregnant?" "When was your last menstrual period?"  Protocols used: Lymph Nodes - Swollen-A-AH, Leg Swelling and Edema-A-AH

## 2022-02-23 ENCOUNTER — Encounter (HOSPITAL_COMMUNITY): Payer: Self-pay

## 2022-02-23 ENCOUNTER — Other Ambulatory Visit: Payer: Self-pay

## 2022-02-23 ENCOUNTER — Emergency Department (HOSPITAL_COMMUNITY)
Admission: EM | Admit: 2022-02-23 | Discharge: 2022-02-23 | Discharge status: Against Medical Advice | Payer: Commercial Managed Care - HMO | Attending: Emergency Medicine | Admitting: Emergency Medicine

## 2022-02-23 DIAGNOSIS — R229 Localized swelling, mass and lump, unspecified: Secondary | ICD-10-CM | POA: Insufficient documentation

## 2022-02-23 DIAGNOSIS — R5383 Other fatigue: Secondary | ICD-10-CM | POA: Insufficient documentation

## 2022-02-23 DIAGNOSIS — Z5321 Procedure and treatment not carried out due to patient leaving prior to being seen by health care provider: Secondary | ICD-10-CM | POA: Insufficient documentation

## 2022-02-23 DIAGNOSIS — R531 Weakness: Secondary | ICD-10-CM | POA: Diagnosis not present

## 2022-02-23 NOTE — Telephone Encounter (Signed)
Pt was called and she states that she is in route to the [facility] for evaluation.

## 2022-02-23 NOTE — ED Notes (Signed)
Pt eloped, pt did not inform staff she was leaving.

## 2022-02-23 NOTE — ED Notes (Signed)
Patient has been called 3 times    no answer each time

## 2022-02-23 NOTE — ED Provider Triage Note (Signed)
Emergency Medicine Provider Triage Evaluation Note  Kristen Moore , a 51 y.o. female  was evaluated in triage.  Pt complains of patient complains of bilateral swollen mass in the axillary region.  Patient states she noticed these masses approxione and half months ago.  She states they are tender and have expanded in size.  Patient states she is high risk of breast cancer due to first-degree relatives with the same.  She is followed by primary care and has routine mammograms.  The patient also endorses increased weakness and fatigue over the past month and a half.  Denies shortness of breath, chest pain, fever, abdominal pain, nausea, vomiting  Review of Systems  Positive: As above Negative: As above  Physical Exam  BP (!) 122/98   Pulse 65   Temp 98.5 F (36.9 C) (Oral)   Resp 16   LMP 11/29/2012   SpO2 98%  Gen:   Awake, no distress   Resp:  Normal effort  MSK:   Moves extremities without difficulty  Other:    Medical Decision Making  Medically screening exam initiated at 11:44 AM.  Appropriate orders placed.  Kristen Moore was informed that the remainder of the evaluation will be completed by another provider, this initial triage assessment does not replace that evaluation, and the importance of remaining in the ED until their evaluation is complete.     Dorothyann Peng, PA-C 02/23/22 1145

## 2022-02-23 NOTE — ED Triage Notes (Signed)
Pt c/o bilateral breast mass x2 months. Pt states they are painful. Pt c/o worsening fatigue and bilateral lower extremity edema x2 months. Fritz Pickerel, [place] bedside.

## 2022-02-24 ENCOUNTER — Encounter: Payer: Self-pay | Admitting: Emergency Medicine

## 2022-02-24 ENCOUNTER — Ambulatory Visit
Admission: EM | Admit: 2022-02-24 | Discharge: 2022-02-24 | Disposition: A | Discharge status: Home / Self Care | Payer: Commercial Managed Care - HMO

## 2022-02-24 DIAGNOSIS — N6311 Unspecified lump in the right breast, upper outer quadrant: Secondary | ICD-10-CM | POA: Diagnosis not present

## 2022-02-24 DIAGNOSIS — R2233 Localized swelling, mass and lump, upper limb, bilateral: Secondary | ICD-10-CM

## 2022-02-24 NOTE — ED Provider Notes (Signed)
[facility]    CSN: [id number] Arrival date & time: 02/24/22  1343      History   Chief Complaint Chief Complaint  Patient presents with   Breast Mass    HPI Kristen Moore is a 51 y.o. female.   Patient here today for evaluation of multiple breast nodules and nodules noted to bilateral axillary. She reports that she had reached out to her PCP who recommended she be seen at urgent care for evaluation. Nodules have been present for about 2 months and have not changed. She reports some associated tenderness. She does have family history significant for different types of cancers- mother with cervical cancer.   The history is provided by the patient.    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   Generalized anxiety disorder 02/02/2020   Screening breast examination 05/19/2019   Bipolar depression ([place]) 05/07/2019   Elevated blood pressure reading in office without diagnosis of hypertension 08/20/2018   Hyperthyroidism 08/20/2018   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   Bilateral lower extremity edema 08/12/2017   Transition of care performed with sharing of clinical summary 08/12/2017   Decreased range of motion of right shoulder 06/13/2017   Muscle weakness of right arm 06/13/2017   Neck pain 06/13/2017   Poor posture 06/13/2017   Pain in right elbow 05/07/2017   Asymptomatic menopausal state 05/06/2017   Localized edema 05/06/2017   Major depressive disorder, single episode 05/06/2017   Pure hypercholesterolemia 05/06/2017   Sprain of left foot 12/20/2016    Rotator cuff impingement syndrome 09/03/2016   Decreased vision in both eyes 06/05/2016   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   IBS (irritable bowel syndrome) 10/21/2015   Dyshidrotic hand dermatitis 10/21/2015   Dyshidrotic foot dermatitis 10/21/2015   S/P bilateral oophorectomy 10/21/2015   Hypothyroidism 10/21/2015   HLD (hyperlipidemia) 03/18/2015   Knee pain, right 12/13/2014   Pelvic pain 12/25/2012   CHEST PAIN UNSPECIFIED 11/05/2008   ELECTROCARDIOGRAM, ABNORMAL 11/05/2008    Past Surgical History:  Procedure Laterality Date   ABDOMINAL HYSTERECTOMY Bilateral 12/25/2012   Procedure: HYSTERECTOMY ABDOMINAL;  Surgeon: Emily Filbert, MD;  Location: [facility];  Service: Gynecology;  Laterality: Bilateral;  TAH and bilateral salpingectomy   BILATERAL SALPINGECTOMY Bilateral 12/25/2012   Procedure: BILATERAL SALPINGECTOMY;  Surgeon: Emily Filbert, MD;  Location: [facility];  Service: Gynecology;  Laterality: Bilateral;   CERVICAL BIOPSY  W/ LOOP ELECTRODE EXCISION     CHOLECYSTECTOMY     COLONOSCOPY     10 + years ago   DILATION AND CURETTAGE OF UTERUS     OOPHORECTOMY Bilateral 12/25/2012   Procedure: OOPHORECTOMY;  Surgeon: Emily Filbert, MD;  Location: [facility];  Service: Gynecology;  Laterality: Bilateral;   TUBAL LIGATION     UPPER GASTROINTESTINAL ENDOSCOPY     10 + years    OB History     Gravida  3   Para  2   Term  1  Preterm  1   AB  1   Living  1      SAB      IAB  1   Ectopic      Multiple      Live Births               Home Medications    Prior to Admission medications   Medication Sig Start Date End Date Taking? Authorizing Provider  atorvastatin (LIPITOR) 40 MG tablet TAKE 1 TABLET (40 MG TOTAL) BY MOUTH DAILY. 11/29/21   Charlott Rakes, MD  buPROPion (WELLBUTRIN XL) 300 MG 24 hr tablet Take 1 tablet (300 mg total) by mouth daily. 02/01/22   Salley Slaughter, NP  busPIRone (BUSPAR) 10 MG tablet Take 1 tablet (10 mg total) by mouth 3  (three) times daily. 02/01/22   Salley Slaughter, NP  cariprazine (VRAYLAR) 1.5 MG capsule Take 1 capsule (1.5 mg total) by mouth daily. 02/01/22   Salley Slaughter, NP  hydrOXYzine (ATARAX) 10 MG tablet Take 0.5 tablets (5 mg total) by mouth 3 (three) times daily as needed. 02/01/22   Salley Slaughter, NP  levothyroxine (SYNTHROID) 150 MCG tablet Take 1 tablet (150 mcg total) by mouth daily. 12/01/21   Charlott Rakes, MD  lithium carbonate 300 MG capsule TAKE 1 CAPSULE BY MOUTH EVERY MORNING & TAKE 2 CAPSULES EVERY NIGHT AT BEDTIME 02/01/22   Salley Slaughter, NP  Multiple Vitamins-Minerals (ONE-A-DAY WOMENS PO) Take by mouth.    [provider]  Na Sulfate-K Sulfate-Mg Sulf 17.5-3.13-1.6 GM/177ML SOLN Take 1 kit by mouth as directed. 02/07/22   Thornton Park, MD  nicotine (NICODERM CQ) 14 mg/24hr patch Place 1 patch (14 mg total) onto the skin daily. Then decrease to 7 mg per 24 hours 02/01/22   Salley Slaughter, NP    Family History Family History  Problem Relation Age of Onset   Hypertension Mother    Rheum arthritis Mother    Fibromyalgia Mother    COPD Mother    Sleep apnea Mother    Thyroid disease Mother    Ovarian cancer Mother    Heart disease Father    Cancer Father 34       lung   Colon cancer Maternal Uncle    Breast cancer Paternal Aunt    Brain cancer Paternal Uncle    Breast cancer Maternal Grandmother    Esophageal cancer Neg Hx    Stomach cancer Neg Hx     Social History Social History   Tobacco Use   Smoking status: Every Day    Packs/day: 0.50    Years: 9.00    Total pack years: 4.50    Types: Cigarettes   Smokeless tobacco: Never  Vaping Use   Vaping Use: Never used  Substance Use Topics   Alcohol use: No    Alcohol/week: 0.0 standard drinks of alcohol   Drug use: No     Allergies   Bee venom, Latex, Penicillins, Ibuprofen, Omega-3, and Other   Review of Systems Review of Systems  Constitutional:  Negative for chills  and fever.  Eyes:  Negative for discharge and redness.  Gastrointestinal:  Negative for nausea and vomiting.  Skin:  Negative for color change and wound.     Physical Exam Triage Vital Signs ED Triage Vitals  Enc Vitals Group     BP      Pulse      Resp      Temp  Temp src      SpO2      Weight      Height      Head Circumference      Peak Flow      Pain Score      Pain Loc      Pain Edu?      Excl. in [place]?    No data found.  Updated Vital Signs BP 124/79   Pulse 65   Temp 98 F (36.7 C)   Resp 18   LMP 11/29/2012   SpO2 95%      Physical Exam Vitals and nursing note reviewed.  Constitutional:      General: She is not in acute distress.    Appearance: Normal appearance. She is not ill-appearing.  HENT:     Head: Normocephalic and atraumatic.  Eyes:     Conjunctiva/sclera: Conjunctivae normal.  Cardiovascular:     Rate and Rhythm: Normal rate.  Pulmonary:     Effort: Pulmonary effort is normal.  Chest:       Comments: Areas of reported nodules, possible firm areas on palpation- patient is tender, difficulty with full palpation, no erythema noted Neurological:     Mental Status: She is alert.  Psychiatric:        Mood and Affect: Mood normal.        Behavior: Behavior normal.        Thought Content: Thought content normal.      UC Treatments / Results  Labs (all labs ordered are listed, but only abnormal results are displayed) Labs Reviewed - No data to display  EKG   Radiology No results found.  Procedures Procedures (including critical care time)  Medications Ordered in UC Medications - No data to display  Initial Impression / Assessment and Plan / UC Course  I have reviewed the triage vital signs and the nursing notes.  Pertinent labs & imaging results that were available during my care of the patient were reviewed by me and considered in my medical decision making (see chart for details).    Diagnostic mammogram ordered for  further evaluation. Contact info provided for the [facility] and recommended patient reach out if she has not heard anything regarding scheduling appointment in the next 48 hours.   Final Clinical Impressions(s) / UC Diagnoses   Final diagnoses:  Mass of upper outer quadrant of right breast  Axillary mass, bilateral   Discharge Instructions   None    ED Prescriptions   None    PDMP not reviewed this encounter.   Francene Finders, PA-C 02/25/22 [phone]

## 2022-02-24 NOTE — ED Triage Notes (Signed)
Pt is present today with breast nodules near her arm pits. Pt state that she noticed it x2 months ago

## 2022-02-26 ENCOUNTER — Other Ambulatory Visit: Payer: Self-pay | Admitting: Family Medicine

## 2022-02-26 ENCOUNTER — Other Ambulatory Visit: Payer: Self-pay | Admitting: Physician Assistant

## 2022-02-26 DIAGNOSIS — N6320 Unspecified lump in the left breast, unspecified quadrant: Secondary | ICD-10-CM

## 2022-02-26 DIAGNOSIS — N6310 Unspecified lump in the right breast, unspecified quadrant: Secondary | ICD-10-CM

## 2022-02-28 ENCOUNTER — Other Ambulatory Visit: Payer: Self-pay

## 2022-03-01 ENCOUNTER — Other Ambulatory Visit: Payer: Self-pay

## 2022-03-07 ENCOUNTER — Telehealth: Payer: Self-pay

## 2022-03-07 ENCOUNTER — Other Ambulatory Visit: Payer: Self-pay

## 2022-03-07 ENCOUNTER — Encounter: Payer: Self-pay | Admitting: Gastroenterology

## 2022-03-07 ENCOUNTER — Ambulatory Visit (AMBULATORY_SURGERY_CENTER): Payer: Commercial Managed Care - HMO | Admitting: Gastroenterology

## 2022-03-07 VITALS — BP 101/49 | HR 58 | Temp 97.3°F | Resp 16 | Ht 64.0 in | Wt 194.0 lb

## 2022-03-07 DIAGNOSIS — Z1211 Encounter for screening for malignant neoplasm of colon: Secondary | ICD-10-CM

## 2022-03-07 DIAGNOSIS — D125 Benign neoplasm of sigmoid colon: Secondary | ICD-10-CM

## 2022-03-07 DIAGNOSIS — Z538 Procedure and treatment not carried out for other reasons: Secondary | ICD-10-CM

## 2022-03-07 DIAGNOSIS — Z809 Family history of malignant neoplasm, unspecified: Secondary | ICD-10-CM

## 2022-03-07 DIAGNOSIS — K635 Polyp of colon: Secondary | ICD-10-CM | POA: Diagnosis not present

## 2022-03-07 MED ORDER — SODIUM CHLORIDE 0.9 % IV SOLN: 500.0000 mL | INTRAVENOUS | Status: DC

## 2022-03-07 NOTE — Progress Notes (Signed)
A and O x3. Report to RN. Tolerated MAC anesthesia well. 

## 2022-03-07 NOTE — Op Note (Addendum)
[facility] Patient Name: Kristen Moore Procedure Date: 03/07/2022 1:29 PM MRN: [id number] Endoscopist: Thornton Park MD, MD Age: 51 Referring MD:  Date of Birth: [birth year] Gender: Female Account #: [id number] Procedure:                Colonoscopy Indications:              Screening for colorectal malignant neoplasm                           Prior colonoscopy >10 years ago, polyps may have                            been removed at that time (records not available)                           Maternal uncle with colon cancer                           Mother with ovarian cancer                           Maternal grandmother with breast cancer Medicines:                Monitored Anesthesia Care Procedure:                Pre-Anesthesia Assessment:                           - Prior to the procedure, a History and Physical                            was performed, and patient medications and                            allergies were reviewed. The patient's tolerance of                            previous anesthesia was also reviewed. The risks                            and benefits of the procedure and the sedation                            options and risks were discussed with the patient.                            All questions were answered, and informed consent                            was obtained. Prior Anticoagulants: The patient has                            taken no previous anticoagulant or antiplatelet  agents. ASA Grade Assessment: II - A patient with                            mild systemic disease. After reviewing the risks                            and benefits, the patient was deemed in                            satisfactory condition to undergo the procedure.                           After obtaining informed consent, the colonoscope                            was passed under direct vision. Throughout the                             procedure, the patient's blood pressure, pulse, and                            oxygen saturations were monitored continuously. The                            CF [id number] #[id number] was introduced through the anus                            and advanced to the 3 cm into the ileum. The                            colonoscopy was technically difficult and complex                            due to inadequate bowel prep. The patient tolerated                            the procedure well. The quality of the bowel                            preparation was inadequate. No anatomical landmarks                            were photographed. Scope In: 1:33:32 PM Scope Out: 1:37:20 PM Total Procedure Duration: 0 hours 3 minutes 48 seconds  Findings:                 The perianal and digital rectal examinations were                            normal.                           A moderate amount of liquid semi-liquid semi-solid  stool was found in the entire colon, making                            visualization difficult. The amount of stool limits                            any meaningful evaluation for polyps or mass.                           A 3 mm polyp was found in the distal sigmoid colon.                            The polyp was sessile. The polyp was removed with a                            cold snare. Resection and retrieval were complete.                            Estimated blood loss was minimal.                           The exam was otherwise without abnormality on                            direct and retroflexion views. Complications:            No immediate complications. Estimated Blood Loss:     Estimated blood loss was minimal. Impression:               - Preparation of the colon was inadequate.                           - Stool in the entire examined colon.                           - One 3 mm polyp in the distal sigmoid colon,                             removed with a cold snare. Resected and retrieved.                           - The examination was otherwise normal on direct                            and retroflexion views. Recommendation:           - Patient has a contact number available for                            emergencies. The signs and symptoms of potential                            delayed complications were discussed with the  patient. Return to normal activities tomorrow.                            Written discharge instructions were provided to the                            patient.                           - Resume previous diet.                           - Continue present medications.                           - Await pathology results.                           - Repeat colonoscopy at the next available                            appointment because the bowel preparation was poor.                            Plan two day bowel prep if the procedure cannot be                            rescheduled for tomorrow.                           - Referral to genetic counseling to consider                            genetic testing.                           - Emerging evidence supports eating a diet of                            fruits, vegetables, grains, calcium, and yogurt                            while reducing red meat and alcohol may reduce the                            risk of colon cancer. Thornton Park MD, MD 03/07/2022 1:47:24 PM This report has been signed electronically.

## 2022-03-07 NOTE — Patient Instructions (Addendum)
Please read handouts provided. Continue present medications. Await pathology results. Diet high in fiber encouraged. Reschedule colonoscopy with two day prep. Consider genetic testing.  YOU HAD AN ENDOSCOPIC PROCEDURE TODAY AT [facility]:   Refer to the procedure report that was given to you for any specific questions about what was found during the examination.  If the procedure report does not answer your questions, please call your gastroenterologist to clarify.  If you requested that your care partner not be given the details of your procedure findings, then the procedure report has been included in a sealed envelope for you to review at your convenience later.  YOU SHOULD EXPECT: Some feelings of bloating in the abdomen. Passage of more gas than usual.  Walking can help get rid of the air that was put into your GI tract during the procedure and reduce the bloating. If you had a lower endoscopy (such as a colonoscopy or flexible sigmoidoscopy) you may notice spotting of blood in your stool or on the toilet paper. If you underwent a bowel prep for your procedure, you may not have a normal bowel movement for a few days.  Please Note:  You might notice some irritation and congestion in your nose or some drainage.  This is from the oxygen used during your procedure.  There is no need for concern and it should clear up in a day or so.  SYMPTOMS TO REPORT IMMEDIATELY:  Following lower endoscopy (colonoscopy or flexible sigmoidoscopy):  Excessive amounts of blood in the stool  Significant tenderness or worsening of abdominal pains  Swelling of the abdomen that is new, acute  Fever of 100F or higher.  For urgent or emergent issues, a gastroenterologist can be reached at any hour by calling [phone]. Do not use MyChart messaging for urgent concerns.    DIET:  We do recommend a small meal at first, but then you may proceed to your regular diet.  Drink plenty of fluids but  you should avoid alcoholic beverages for 24 hours.  ACTIVITY:  You should plan to take it easy for the rest of today and you should NOT DRIVE or use heavy machinery until tomorrow (because of the sedation medicines used during the test).    FOLLOW UP: Our staff will call the number listed on your records the next business day following your procedure.  We will call around 7:15- 8:00 am to check on you and address any questions or concerns that you may have regarding the information given to you following your procedure. If we do not reach you, we will leave a message.     If any biopsies were taken you will be contacted by phone or by letter within the next 1-3 weeks.  Please call us at [phone] if you have not heard about the biopsies in 3 weeks.    SIGNATURES/CONFIDENTIALITY: You and/or your care partner have signed paperwork which will be entered into your electronic medical record.  These signatures attest to the fact that that the information above on your After Visit Summary has been reviewed and is understood.  Full responsibility of the confidentiality of this discharge information lies with you and/or your care-partner.

## 2022-03-07 NOTE — Telephone Encounter (Signed)
Referral placed.

## 2022-03-07 NOTE — Progress Notes (Signed)
Referring Provider: Charlott Rakes, MD Primary Care Physician:  Charlott Rakes, MD    Indication for Colonoscopy:  Colon cancer screening   IMPRESSION:  Need for colon cancer screening Appropriate candidate for monitored anesthesia care  PLAN: Colonoscopy in the [place] today   HPI: Kristen Moore is a 51 y.o. female presents for screening colonoscopy.  She reports a distant colonoscopy. Polyps may have been removed. She thinks it was performed at [facility] but I am unable to located any procedure note or related pathology results.  Maternal uncle with colon cancer. Mother with ovarian cancer. Maternal grandmother with breast cancer. No known family history of colon cancer or polyps. No family history of uterine/endometrial cancer, pancreatic cancer or gastric/stomach cancer.   Past Medical History:  Diagnosis Date   Abnormal Pap smear    cryo   Anemia    Asthma    as a child   Bipolar 1 disorder ([place])    Cyst of breast    left breast   DUB (dysfunctional uterine bleeding)    Fibroids    Headache(784.0)    Hypothyroidism    IBS (irritable bowel syndrome)    Thyroid dysfunction     Past Surgical History:  Procedure Laterality Date   ABDOMINAL HYSTERECTOMY Bilateral 12/25/2012   Procedure: HYSTERECTOMY ABDOMINAL;  Surgeon: Emily Filbert, MD;  Location: [facility];  Service: Gynecology;  Laterality: Bilateral;  TAH and bilateral salpingectomy   BILATERAL SALPINGECTOMY Bilateral 12/25/2012   Procedure: BILATERAL SALPINGECTOMY;  Surgeon: Emily Filbert, MD;  Location: [facility];  Service: Gynecology;  Laterality: Bilateral;   CERVICAL BIOPSY  W/ LOOP ELECTRODE EXCISION     CHOLECYSTECTOMY     COLONOSCOPY     10 + years ago   DILATION AND CURETTAGE OF UTERUS     OOPHORECTOMY Bilateral 12/25/2012   Procedure: OOPHORECTOMY;  Surgeon: Emily Filbert, MD;  Location: [facility];  Service: Gynecology;  Laterality: Bilateral;   TUBAL LIGATION     UPPER GASTROINTESTINAL ENDOSCOPY      10 + years    Current Outpatient Medications  Medication Sig Dispense Refill   atorvastatin (LIPITOR) 40 MG tablet TAKE 1 TABLET (40 MG TOTAL) BY MOUTH DAILY. 30 tablet 6   buPROPion (WELLBUTRIN XL) 300 MG 24 hr tablet Take 1 tablet (300 mg total) by mouth daily. 30 tablet 3   cariprazine (VRAYLAR) 1.5 MG capsule Take 1 capsule (1.5 mg total) by mouth daily. 30 capsule 3   levothyroxine (SYNTHROID) 150 MCG tablet Take 1 tablet (150 mcg total) by mouth daily. 30 tablet 6   lithium carbonate 300 MG capsule TAKE 1 CAPSULE BY MOUTH EVERY MORNING & TAKE 2 CAPSULES EVERY NIGHT AT BEDTIME 90 capsule 3   busPIRone (BUSPAR) 10 MG tablet Take 1 tablet (10 mg total) by mouth 3 (three) times daily. 90 tablet 3   hydrOXYzine (ATARAX) 10 MG tablet Take 0.5 tablets (5 mg total) by mouth 3 (three) times daily as needed. 45 tablet 3   Multiple Vitamins-Minerals (ONE-A-DAY WOMENS PO) Take by mouth.     nicotine (NICODERM CQ) 14 mg/24hr patch Place 1 patch (14 mg total) onto the skin daily. Then decrease to 7 mg per 24 hours 28 patch 2   Current Facility-Administered Medications  Medication Dose Route Frequency Provider Last Rate Last Admin   0.9 %  sodium chloride infusion  500 mL Intravenous Continuous Thornton Park, MD        Allergies as of 03/07/2022 - Review  Complete 03/07/2022  Allergen Reaction Noted   Bee venom Swelling and Anaphylaxis 01/16/2016   Latex Swelling 04/30/2011   Penicillins Swelling 04/30/2011   Ibuprofen Swelling 02/07/2022   Omega-3 Hives and Swelling 02/27/2012   Other Hives and Swelling 02/27/2012    Family History  Problem Relation Age of Onset   Hypertension Mother    Rheum arthritis Mother    Fibromyalgia Mother    COPD Mother    Sleep apnea Mother    Thyroid disease Mother    Ovarian cancer Mother    Heart disease Father    Cancer Father 35       lung   Colon cancer Maternal Uncle    Breast cancer Paternal Aunt    Brain cancer Paternal Uncle    Breast  cancer Maternal Grandmother    Esophageal cancer Neg Hx    Stomach cancer Neg Hx      Physical Exam: General:   Alert,  well-nourished, pleasant and cooperative in NAD Head:  Normocephalic and atraumatic. Eyes:  Sclera clear, no icterus.   Conjunctiva pink. Mouth:  No deformity or lesions.   Neck:  Supple; no masses or thyromegaly. Lungs:  Clear throughout to auscultation.   No wheezes. Heart:  Regular rate and rhythm; no murmurs. Abdomen:  Soft, non-tender, nondistended, normal bowel sounds, no rebound or guarding.  Msk:  Symmetrical. No boney deformities LAD: No inguinal or umbilical LAD Extremities:  No clubbing or edema. Neurologic:  Alert and  oriented x4;  grossly nonfocal Skin:  No obvious rash or bruise. Psych:  Alert and cooperative. Normal mood and affect.     Studies/Results: No results found.    Mariaguadalupe Fialkowski L. Tarri Glenn, MD, MPH 03/07/2022, 1:22 PM

## 2022-03-07 NOTE — Telephone Encounter (Signed)
-----   Message from Thornton Park, MD sent at 03/07/2022  1:31 PM EDT ----- Please make referral to genetic counseling - strong family history of cancer.  Thanks.  KLB

## 2022-03-07 NOTE — Telephone Encounter (Signed)
AMB referral placed due to poor prep. Pt rescheduled to 03/22/22 at 10:30 am

## 2022-03-08 ENCOUNTER — Other Ambulatory Visit: Payer: Self-pay | Admitting: Physician Assistant

## 2022-03-08 ENCOUNTER — Telehealth: Payer: Self-pay | Admitting: Genetic Counselor

## 2022-03-08 ENCOUNTER — Telehealth: Payer: Self-pay

## 2022-03-08 ENCOUNTER — Ambulatory Visit
Admission: RE | Admit: 2022-03-08 | Discharge: 2022-03-08 | Disposition: A | Discharge status: Home / Self Care | Payer: Commercial Managed Care - HMO | Source: Ambulatory Visit | Attending: Family Medicine | Admitting: Family Medicine

## 2022-03-08 DIAGNOSIS — N6310 Unspecified lump in the right breast, unspecified quadrant: Secondary | ICD-10-CM

## 2022-03-08 DIAGNOSIS — N6320 Unspecified lump in the left breast, unspecified quadrant: Secondary | ICD-10-CM

## 2022-03-08 NOTE — Telephone Encounter (Signed)
Scheduled appt per 10/4 referral. Pt is aware of appt date and time. Pt is aware to arrive 15 mins prior to appt time and to bring and updated insurance card. Pt is aware of appt location.   

## 2022-03-08 NOTE — Telephone Encounter (Signed)
  Follow up Call-     03/07/2022   12:30 PM  Call back number  Post procedure Call Back phone  # [phone]  Permission to leave phone message Yes     Patient questions:  Do you have a fever, pain , or abdominal swelling? No. Pain Score  0 *  Have you tolerated food without any problems? Yes.    Have you been able to return to your normal activities? Yes.    Do you have any questions about your discharge instructions: Diet   No. Medications  No. Follow up visit  No.  Do you have questions or concerns about your Care? No.  Actions: * If pain score is 4 or above: No action needed, pain <4.

## 2022-03-09 ENCOUNTER — Telehealth: Payer: Self-pay | Admitting: Family Medicine

## 2022-03-09 NOTE — Telephone Encounter (Signed)
Pt was called and she states that she received a mychart message when asking her to explain more she began to get upset and she said to forget about it and hung up the phone.  Once reviewing the patients chart she need to contact [facility] to schedule an appointment.

## 2022-03-09 NOTE — Telephone Encounter (Signed)
Pt is calling she was referred by [facility] to contact Dr. Margarita Rana. Please advise CB- [phone]

## 2022-03-14 ENCOUNTER — Encounter: Payer: Self-pay | Admitting: Gastroenterology

## 2022-03-14 NOTE — Telephone Encounter (Signed)
Inbound call from patient inquiring about results. Please give patient a call back to further advise.  Thank you

## 2022-03-14 NOTE — Telephone Encounter (Signed)
Patient has been advised that Dr. Tarri Glenn will review path results & then will send a letter in mychart.

## 2022-03-15 ENCOUNTER — Telehealth: Payer: Self-pay | Admitting: Gastroenterology

## 2022-03-21 NOTE — Telephone Encounter (Signed)
Patent called requesting to go over her path results with someone.

## 2022-03-21 NOTE — Telephone Encounter (Signed)
See result note.  

## 2022-03-22 ENCOUNTER — Encounter: Payer: Commercial Managed Care - HMO | Admitting: Gastroenterology

## 2022-04-02 ENCOUNTER — Other Ambulatory Visit (HOSPITAL_COMMUNITY): Payer: Self-pay

## 2022-04-02 ENCOUNTER — Other Ambulatory Visit: Payer: Self-pay

## 2022-04-05 ENCOUNTER — Other Ambulatory Visit (HOSPITAL_COMMUNITY): Payer: Self-pay

## 2022-04-05 ENCOUNTER — Encounter (HOSPITAL_COMMUNITY): Payer: Self-pay

## 2022-04-06 ENCOUNTER — Other Ambulatory Visit: Payer: Self-pay

## 2022-04-09 ENCOUNTER — Other Ambulatory Visit: Payer: Self-pay

## 2022-04-10 ENCOUNTER — Other Ambulatory Visit: Payer: Self-pay

## 2022-04-10 ENCOUNTER — Other Ambulatory Visit (HOSPITAL_COMMUNITY): Payer: Self-pay
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
## 2022-04-14 NOTE — Patient Instructions (Signed)
Thank you for attending your appointment today.  -- STOP Vraylar and Buspar at this time given side effects/minimal benefit -- START Seroquel 25 mg nightly for 6 days; if tolerating well increase to 50 mg nightly thereafter -- Try taking 1/2 tablet hydroxyzine (5 mg) as needed during the day as needed for anxiety if the full tablet is too sedating -- Continue lithium and Wellbutrin as prescribed.  Please do not make any changes to medications without first discussing with your provider. If you are experiencing a psychiatric emergency, please call 911 or present to your nearest emergency department. Additional crisis, medication management, and therapy resources are included below.  [facility]  [address], [place], [place] [address] [phone] WALK-IN URGENT CARE 24/7 FOR ANYONE [address] [place], [place]  [phone] Fax: [phone] [email or web address] *Interpreters available *Accepts all insurance and uninsured for Urgent Care needs *Accepts Medicaid and uninsured for outpatient treatment (below)      ONLY FOR [facility]  Below:    Outpatient New Patient Assessment/Therapy Walk-ins:        Monday -Thursday 8am until slots are full.        Every Friday 1pm-4pm  (first come, first served)                   New Patient Psychiatry/Medication Management        Monday-Friday 8am-11am (first come, first served)               For all walk-ins we ask that you arrive by 7:15am, because patients will be seen in the order of arrival.

## 2022-04-16 ENCOUNTER — Encounter (HOSPITAL_COMMUNITY): Payer: Self-pay | Admitting: Psychiatry

## 2022-04-16 ENCOUNTER — Telehealth (HOSPITAL_COMMUNITY): Payer: No Payment, Other | Admitting: Psychiatry

## 2022-04-16 ENCOUNTER — Telehealth (INDEPENDENT_AMBULATORY_CARE_PROVIDER_SITE_OTHER): Payer: No Payment, Other | Admitting: Psychiatry

## 2022-04-16 DIAGNOSIS — F319 Bipolar disorder, unspecified: Secondary | ICD-10-CM | POA: Diagnosis not present

## 2022-04-16 DIAGNOSIS — F411 Generalized anxiety disorder: Secondary | ICD-10-CM

## 2022-04-16 MED ORDER — BUPROPION HCL ER (XL) 300 MG PO TB24: 300.0000 mg | ORAL_TABLET | Freq: Every day | ORAL | 2 refills | Status: DC

## 2022-04-16 MED ORDER — LITHIUM CARBONATE 300 MG PO CAPS: ORAL_CAPSULE | ORAL | 2 refills | Status: DC

## 2022-04-16 MED ORDER — HYDROXYZINE HCL 10 MG PO TABS: ORAL_TABLET | ORAL | 2 refills | Status: DC

## 2022-04-16 MED ORDER — QUETIAPINE FUMARATE 50 MG PO TABS: ORAL_TABLET | ORAL | 2 refills | Status: DC

## 2022-04-19 ENCOUNTER — Other Ambulatory Visit: Payer: Self-pay

## 2022-04-19 ENCOUNTER — Other Ambulatory Visit (HOSPITAL_COMMUNITY): Payer: Self-pay

## 2022-05-03 ENCOUNTER — Other Ambulatory Visit: Payer: Self-pay

## 2022-05-03 ENCOUNTER — Other Ambulatory Visit (HOSPITAL_COMMUNITY): Payer: Self-pay

## 2022-05-03 ENCOUNTER — Ambulatory Visit: Payer: Self-pay

## 2022-05-03 NOTE — Telephone Encounter (Signed)
Pt has had left ear pain for about a month.  Pt wanted an am appt. Due to work.  First available is Jan 17.  I did advise I would send a message.   Chief Complaint: Left ear pain Symptoms: Pain, runny nose, cough Frequency: 1 month ago Pertinent Negatives: Patient denies fever Disposition: '[]'$ ED /'[]'$ Urgent Care (no appt availability in office) / '[]'$ Appointment(In office/virtual)/ '[]'$  [facility]/ '[]'$ Home Care/ '[]'$ Refused Recommended Disposition /'[]'$ [facility]/ '[x]'$  Follow-up with PCP Additional Notes: Declines mobile unit today - pt. At work. Asking to be worked in tomorrow morning.  Answer Assessment - Initial Assessment Questions 1. LOCATION: "Which ear is involved?"     Left ear 2. ONSET: "When did the ear start hurting"      1 month ago 3. SEVERITY: "How bad is the pain?"  (Scale 1-10; mild, moderate or severe)   - MILD (1-3): doesn't interfere with normal activities    - MODERATE (4-7): interferes with normal activities or awakens from sleep    - SEVERE (8-10): excruciating pain, unable to do any normal activities      Now - 7 4. URI SYMPTOMS: "Do you have a runny nose or cough?"     Cough, runny nose 5. FEVER: "Do you have a fever?" If Yes, ask: "What is your temperature, how was it measured, and when did it start?"     No 6. CAUSE: "Have you been swimming recently?", "How often do you use Q-TIPS?", "Have you had any recent air travel or scuba diving?"     No 7. OTHER SYMPTOMS: "Do you have any other symptoms?" (e.g., headache, stiff neck, dizziness, vomiting, runny nose, decreased hearing)     No 8. PREGNANCY: "Is there any chance you are pregnant?" "When was your last menstrual period?"     No  Protocols used: Bethann Punches

## 2022-05-04 ENCOUNTER — Emergency Department (HOSPITAL_BASED_OUTPATIENT_CLINIC_OR_DEPARTMENT_OTHER)
Admission: EM | Admit: 2022-05-04 | Discharge: 2022-05-04 | Disposition: A | Discharge status: Home / Self Care | Payer: Commercial Managed Care - HMO | Attending: Student | Admitting: Student

## 2022-05-04 ENCOUNTER — Other Ambulatory Visit: Payer: Self-pay

## 2022-05-04 ENCOUNTER — Encounter (HOSPITAL_BASED_OUTPATIENT_CLINIC_OR_DEPARTMENT_OTHER): Payer: Self-pay | Admitting: Emergency Medicine

## 2022-05-04 DIAGNOSIS — Z91040 Latex allergy status: Secondary | ICD-10-CM | POA: Insufficient documentation

## 2022-05-04 DIAGNOSIS — E039 Hypothyroidism, unspecified: Secondary | ICD-10-CM | POA: Diagnosis not present

## 2022-05-04 DIAGNOSIS — H748X2 Other specified disorders of left middle ear and mastoid: Secondary | ICD-10-CM | POA: Insufficient documentation

## 2022-05-04 DIAGNOSIS — Z79890 Hormone replacement therapy (postmenopausal): Secondary | ICD-10-CM | POA: Diagnosis not present

## 2022-05-04 DIAGNOSIS — Z1152 Encounter for screening for COVID-19: Secondary | ICD-10-CM | POA: Insufficient documentation

## 2022-05-04 DIAGNOSIS — J45909 Unspecified asthma, uncomplicated: Secondary | ICD-10-CM | POA: Diagnosis not present

## 2022-05-04 DIAGNOSIS — H9202 Otalgia, left ear: Secondary | ICD-10-CM

## 2022-05-04 LAB — RESP PANEL BY RT-PCR (FLU A&B, COVID) ARPGX2
Influenza A by PCR: NEGATIVE
Influenza B by PCR: NEGATIVE
SARS Coronavirus 2 by RT PCR: NEGATIVE

## 2022-05-04 MED ORDER — CEFDINIR 300 MG PO CAPS: 300.0000 mg | ORAL_CAPSULE | Freq: Two times a day (BID) | ORAL | 0 refills | Status: AC

## 2022-05-04 NOTE — Telephone Encounter (Signed)
Pt is currently in UC to get evaluated.

## 2022-05-04 NOTE — ED Notes (Signed)
Patient verbalized understanding of discharge instructions and reasons to return to the ED 

## 2022-05-04 NOTE — Discharge Instructions (Addendum)
You have been seen today for your complaint of left ear pain.. Your discharge medications include cefdinir.  This is an antibiotic.  You should take it as prescribed and for the entire duration of the prescription. Claritin and Flonase.  These are medications used to treat congestion and runny nose.  These are often causes of ear pain.  You should follow dosing instructions on the packaging.  These are both over-the-counter medications. Tylenol and ibuprofen.  He may alternate these every 4 hours for pain.  You may take up to 800 mg of ibuprofen and up to 1000 mg of Tylenol at a time. Follow up with: your primary care provider in one week Please seek immediate medical care if you develop any of the following symptoms: You have a severe headache. You have a stiff neck. You have trouble swallowing. You have redness or swelling behind your ear. You have fluid or blood coming from your ear. You have hearing loss. You feel dizzy. At this time there does not appear to be the presence of an emergent medical condition, however there is always the potential for conditions to change. Please read and follow the below instructions.  Do not take your medicine if  develop an itchy rash, swelling in your mouth or lips, or difficulty breathing; call 911 and seek immediate emergency medical attention if this occurs.  You may review your lab tests and imaging results in their entirety on your MyChart account.  Please discuss all results of fully with your primary care provider and other specialist at your follow-up visit.  Note: Portions of this text may have been transcribed using voice recognition software. Every effort was made to ensure accuracy; however, inadvertent computerized transcription errors may still be present.

## 2022-05-04 NOTE — ED Notes (Signed)
Patient is taking her own tylenol for pain

## 2022-05-04 NOTE — Telephone Encounter (Signed)
We can place her on the walk-in schedule.  Thanks

## 2022-05-04 NOTE — ED Triage Notes (Signed)
Left ear pain x 1 month. Sore throat and cough x 2 weeks .

## 2022-05-04 NOTE — Telephone Encounter (Signed)
Routing to PCP for review.

## 2022-05-04 NOTE — ED Provider Notes (Signed)
[facility] EMERGENCY DEPARTMENT Provider Note   CSN: [id number] Arrival date & time: 05/04/22  1142     History  Chief Complaint  Patient presents with   Otalgia    left    Kristen Moore is a 51 y.o. female.  With a history of bipolar 1, anemia, IBS, hypothyroidism, asthma who presents ED for evaluation of left sided otalgia.  She reports that this pain began abruptly 2 weeks ago and has persisted.  States cold weather makes the pain worse.  Also states pain is occasionally worse with talking.  Has been taking Tylenol for the pain with moderate relief.  States she has had numerous ear infections as a child and her daughter also gets frequent ear infections.  Denies changes in hearing, sore throat, difficulty eating or drinking, fevers, chills, recent swimming.  States she tried to get into see her primary care provider regarding this issue, but was not able to be seen until January 18.   Otalgia      Home Medications Prior to Admission medications   Medication Sig Start Date End Date Taking? Authorizing Provider  atorvastatin (LIPITOR) 40 MG tablet TAKE 1 TABLET (40 MG TOTAL) BY MOUTH DAILY. 11/29/21   Charlott Rakes, MD  buPROPion (WELLBUTRIN XL) 300 MG 24 hr tablet Take 1 tablet (300 mg total) by mouth daily. 02/01/22   Salley Slaughter, NP  buPROPion (WELLBUTRIN XL) 300 MG 24 hr tablet Take 1 tablet (300 mg total) by mouth daily. 04/16/22 07/15/22  Bahraini, Sarah A  hydrOXYzine (ATARAX) 10 MG tablet Take 0.5 tablets (5 mg total) by mouth 3 (three) times daily as needed. 02/01/22   Salley Slaughter, NP  hydrOXYzine (ATARAX) 10 MG tablet Take 1/2 to 1 tablet (5 mg to 10 mg) up to three times a day as needed for anxiety or sleep. 04/16/22   Bahraini, Sarah A  levothyroxine (SYNTHROID) 150 MCG tablet Take 1 tablet (150 mcg total) by mouth daily. 12/01/21   Charlott Rakes, MD  lithium carbonate 300 MG capsule TAKE 1 CAPSULE BY MOUTH EVERY MORNING & TAKE 2 CAPSULES  EVERY NIGHT AT BEDTIME 02/01/22   Eulis Canner E, NP  lithium carbonate 300 MG capsule Take 1 capsule (300 mg total) by mouth in the morning AND 2 capsules (600 mg total) at bedtime. 04/16/22 07/15/22  Bahraini, Sarah A  Multiple Vitamins-Minerals (ONE-A-DAY WOMENS PO) Take by mouth.    [provider]  nicotine (NICODERM CQ) 14 mg/24hr patch Place 1 patch (14 mg total) onto the skin daily. Then decrease to 7 mg per 24 hours 02/01/22   Salley Slaughter, NP  QUEtiapine (SEROQUEL) 50 MG tablet Take 1/2 tablet (25 mg total) nightly for 6 days then INCREASE to 1 tablet (50 mg total) nightly thereafter. 04/16/22   Bahraini, Sarah A      Allergies    Bee venom, Latex, Penicillins, Ibuprofen, Omega-3, and Other    Review of Systems   Review of Systems  HENT:  Positive for ear pain.   All other systems reviewed and are negative.   Physical Exam Updated Vital Signs BP 121/88   Pulse 64   Temp 97.9 F (36.6 C) (Oral)   Resp 16   Ht '5\' 4"'$  (1.626 m)   Wt 89.8 kg   LMP 11/29/2012   SpO2 99%   BMI 33.99 kg/m  Physical Exam Vitals and nursing note reviewed.  Constitutional:      General: She is not in acute  distress.    Appearance: Normal appearance. She is normal weight. She is not ill-appearing.  HENT:     Head: Normocephalic and atraumatic.     Right Ear: Hearing, ear canal and external ear normal. A middle ear effusion is present. Tympanic membrane is injected. Tympanic membrane is not perforated, erythematous, retracted or bulging.     Left Ear: Hearing, tympanic membrane, ear canal and external ear normal.     Mouth/Throat:     Mouth: Mucous membranes are moist.     Pharynx: Oropharynx is clear.  Pulmonary:     Effort: Pulmonary effort is normal. No respiratory distress.  Abdominal:     General: Abdomen is flat.  Musculoskeletal:        General: Normal range of motion.     Cervical back: Neck supple.  Skin:    General: Skin is warm and dry.  Neurological:      Mental Status: She is alert and oriented to person, place, and time.  Psychiatric:        Mood and Affect: Mood normal.        Behavior: Behavior normal.     ED Results / Procedures / Treatments   Labs (all labs ordered are listed, but only abnormal results are displayed) Labs Reviewed  RESP PANEL BY RT-PCR (FLU A&B, COVID) [id number]    EKG None  Radiology No results found.  Procedures Procedures    Medications Ordered in ED Medications - No data to display  ED Course/ Medical Decision Making/ A&P                           Medical Decision Making This patient presents to the ED for concern of left ear pain, this involves an extensive number of treatment options, and is a complaint that carries with it a high risk of complications and morbidity.  The differential diagnosis includes middle ear effusion, otitis media, mastoiditis, otitis externa   Co morbidities that complicate the patient evaluation  bipolar 1, anemia, IBS, hypothyroidism, asthma   Additional history obtained from: Nursing notes from this visit.  Afebrile, hemodynamically stable.  51 year old female presenting to the ED for evaluation of left ear pain.  Pain has been present and constant for 2 weeks.  No recent change.  No fevers or chills.  No radiation of the pain.  Physical exam remarkable for small middle ear effusion on the left, is otherwise unremarkable and have low suspicion for mastoiditis versus Ludewig's angina versus other emergent conditions.  Pain may be secondary to the middle ear effusion.  Patient was educated on the use of intranasal steroids and antihistamines for the symptoms.  Was also educated on appropriate dosing of Tylenol and ibuprofen at home for pain.  We will send an antibiotic prescription due to patient's extensive history of ear infections.  Strong encourage patient to follow-up with her primary care provider in 1 week if possible.  Stable at discharge  At this time there does  not appear to be any evidence of an acute emergency medical condition and the patient appears stable for discharge with appropriate outpatient follow up. Diagnosis was discussed with patient who verbalizes understanding of care plan and is agreeable to discharge. I have discussed return precautions with patient who verbalizes understanding. Patient encouraged to follow-up with their PCP within 1 week. All questions answered.  Note: Portions of this report may have been transcribed using voice recognition software. Every effort was made to  ensure accuracy; however, inadvertent computerized transcription errors may still be present.          Final Clinical Impression(s) / ED Diagnoses Final diagnoses:  None    Rx / DC Orders ED Discharge Orders     None         Roylene Reason, Hershal Coria 05/04/22 [place]    Kommor, Debe Coder, MD 05/05/22 1132

## 2022-05-07 ENCOUNTER — Other Ambulatory Visit: Payer: Self-pay

## 2022-05-07 ENCOUNTER — Inpatient Hospital Stay: Payer: Commercial Managed Care - HMO | Attending: Genetic Counselor | Admitting: Genetic Counselor

## 2022-05-07 ENCOUNTER — Encounter: Payer: Self-pay | Admitting: Genetic Counselor

## 2022-05-07 ENCOUNTER — Other Ambulatory Visit: Payer: Self-pay | Admitting: Genetic Counselor

## 2022-05-07 ENCOUNTER — Inpatient Hospital Stay: Payer: Commercial Managed Care - HMO

## 2022-05-07 ENCOUNTER — Other Ambulatory Visit (HOSPITAL_COMMUNITY): Payer: Self-pay

## 2022-05-07 DIAGNOSIS — Z8041 Family history of malignant neoplasm of ovary: Secondary | ICD-10-CM

## 2022-05-07 DIAGNOSIS — Z803 Family history of malignant neoplasm of breast: Secondary | ICD-10-CM

## 2022-05-07 DIAGNOSIS — Z800 Family history of malignant neoplasm of digestive organs: Secondary | ICD-10-CM

## 2022-05-07 LAB — GENETIC SCREENING ORDER

## 2022-05-07 NOTE — Progress Notes (Signed)
REFERRING PROVIDER: Thornton Park, MD [place],  [place] [address]  PRIMARY PROVIDER:  Charlott Rakes, MD  PRIMARY REASON FOR VISIT:  Encounter Diagnoses  Name Primary?   Family history of breast cancer Yes   Family history of ovarian cancer      HISTORY OF PRESENT ILLNESS:   Ms. Ackman, a 51 y.o. female, was seen for a [place] cancer genetics consultation at the request of Dr. Tarri Glenn due to a family history of breast, ovarian, and other cancers.  Ms. Stormes presents to clinic today to discuss the possibility of a hereditary predisposition to cancer, to discuss genetic testing, and to further clarify her future cancer risks, as well as potential cancer risks for family members.    CANCER HISTORY:  Last year, Ms. 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    RISK FACTORS:  Mammogram within the last year: yes; most recent 03/2022; category b density   Number of breast biopsies: 0. Colonoscopy: yes;  03/2022; one hyperplastic polyp . Hysterectomy: yes in 2014 due to fibroids Ovaries intact: no; bilateral oophorectomy in 2014 Menarche was at age 57.  First live birth at age 86.  OCP use for approximately  more than 10  years.  HRT use: estrogen for 6 years.  Stopped in 2019.  Dermatology screening: yes.  Past Medical History:  Diagnosis Date   Abnormal Pap smear    cryo   Anemia    Asthma    as a child   Bipolar 1 disorder ([place])    Cyst of breast    left breast   DUB (dysfunctional uterine bleeding)    Fibroids    Headache(784.0)    Hypothyroidism    IBS (irritable bowel syndrome)    Thyroid dysfunction     Past Surgical History:  Procedure Laterality Date   ABDOMINAL HYSTERECTOMY Bilateral 12/25/2012   Procedure: HYSTERECTOMY ABDOMINAL;  Surgeon: Emily Filbert, MD;  Location: [facility];  Service: Gynecology;  Laterality: Bilateral;  TAH and bilateral salpingectomy   BILATERAL SALPINGECTOMY Bilateral 12/25/2012   Procedure:  BILATERAL SALPINGECTOMY;  Surgeon: Emily Filbert, MD;  Location: [facility];  Service: Gynecology;  Laterality: Bilateral;   CERVICAL BIOPSY  W/ LOOP ELECTRODE EXCISION     CHOLECYSTECTOMY     COLONOSCOPY     10 + years ago   DILATION AND CURETTAGE OF UTERUS     OOPHORECTOMY Bilateral 12/25/2012   Procedure: OOPHORECTOMY;  Surgeon: Emily Filbert, MD;  Location: [facility];  Service: Gynecology;  Laterality: Bilateral;   TUBAL LIGATION     UPPER GASTROINTESTINAL ENDOSCOPY     10 + years    FAMILY HISTORY:  We obtained a detailed, 4-generation family history.  Significant diagnoses are listed below: Family History  Problem Relation Age of Onset   Ovarian cancer Mother 36   Lung cancer Father        dx 87s   Skin cancer Maternal Uncle        melanoma on back   Breast cancer Paternal Aunt        dx 71s   Lung cancer Paternal Uncle 49       mets to brain   Breast cancer Maternal Grandmother        dx 51s   Colon cancer Other        MGF's brother; dx 34s      In 2021, Ms. Hehn's mother had negative genetic testing for the [facility] CancerNext-Expanded +  RNAinsight Panel.  The CancerNext-Expanded gene panel offered by [facility] and includes sequencing, rearrangement, and RNA analysis for the following 77 genes: AIP, ALK, APC, ATM, AXIN2, BAP1, BARD1, BLM, BMPR1A, BRCA1, BRCA2, BRIP1, CDC73, CDH1, CDK4, CDKN1B, CDKN2A, CHEK2, CTNNA1, DICER1, FANCC, FH, FLCN, GALNT12, KIF1B, LZTR1, MAX, MEN1, MET, MLH1, MSH2, MSH3, MSH6, MUTYH, NBN, NF1, NF2, NTHL1, PALB2, PHOX2B, PMS2, POT1, PRKAR1A, PTCH1, PTEN, RAD51C, RAD51D, RB1, RECQL, RET, SDHA, SDHAF2, SDHB, SDHC, SDHD, SMAD4, SMARCA4, SMARCB1, SMARCE1, STK11, SUFU, TMEM127, TP53, TSC1, TSC2, VHL and XRCC2 (sequencing and deletion/duplication); EGFR, EGLN1, HOXB13, KIT, MITF, PDGFRA, POLD1, and POLE (sequencing only); EPCAM and GREM1 (deletion/duplication only).   Paternal relatives (father, paternal aunt, etc.) are unavailable for genetic testing at this  time.   Ms. Lauro is unaware of other  previous family history of genetic testing for hereditary cancer risks. There is no reported Ashkenazi Jewish ancestry. There is no known consanguinity.  GENETIC COUNSELING ASSESSMENT: Ms. Junker is a 51 y.o. female with a family history of breast cancer which is somewhat suggestive of a hereditary cancer syndrome given her paternal aunt's age of diagnosis. We, therefore, discussed and recommended the following at today's visit.   DISCUSSION: We discussed that 5 - 10% of cancer is hereditary, with most cases of hereditary breast cancer associated with mutations in BRCA1/2.  There are other genes that can be associated with hereditary breast cancer syndromes.  We discussed that testing is beneficial for several reasons, including knowing about other cancer risks, identifying potential screening and risk-reduction options that may be appropriate, and to understanding if other family members could be at risk for cancer and allowing them to undergo genetic testing.  Since Ms. Wurtzel's mother did not have an identifiable mutation in a cancer predisposition gene included on the above genetic testing panel panel, Ms. Fabre could not have inherited a known mutation from her in one of these genes.  However, we discussed that she meets NCCN criteria based on her paternal family history of breast cancer in her aunt in her 92s.   We reviewed the characteristics, features and inheritance patterns of hereditary cancer syndromes. We also discussed genetic testing, including the appropriate family members to test, the process of testing, insurance coverage and turn-around-time for results. We discussed the implications of a negative, positive, and variant of uncertain significant result. We discussed that negative results would be uninformative given that Ms. Mcewen does not have a personal history of cancer. We recommended Ms. Hathaway pursue genetic testing for a  panel that contains genes associated with breast and other cancers.  The Multi-Cancer + RNA Panel offered by [facility] includes sequencing and/or deletion/duplication analysis of the following 70 genes:  AIP*, ALK, APC*, ATM*, AXIN2*, BAP1*, BARD1*, BLM*, BMPR1A*, BRCA1*, BRCA2*, BRIP1*, CDC73*, CDH1*, CDK4, CDKN1B*, CDKN2A, CHEK2*, CTNNA1*, DICER1*, EPCAM, EGFR, FH*, FLCN*, GREM1, HOXB13, KIT, LZTR1, MAX*, MBD4, MEN1*, MET, MITF, MLH1*, MSH2*, MSH3*, MSH6*, MUTYH*, NF1*, NF2*, NTHL1*, PALB2*, PDGFRA, PMS2*, POLD1*, POLE*, POT1*, PRKAR1A*, PTCH1*, PTEN*, RAD51C*, RAD51D*, RB1*, RET, SDHA*, SDHAF2*, SDHB*, SDHC*, SDHD*, SMAD4*, SMARCA4*, SMARCB1*, SMARCE1*, STK11*, SUFU*, TMEM127*, TP53*, TSC1*, TSC2*, VHL*. RNA analysis is performed for * genes.   Based on Ms. Burnley's paternal family history of breast cancer, she meets medical criteria for genetic testing. Despite that she meets criteria, she may still have an out of pocket cost. We discussed that if her out of pocket cost for testing is over $100, the laboratory should contact them to discuss self-pay options and/or patient pay assistance programs.  We discussed the Genetic Information Non-Discrimination Act (GINA) of 2008, which helps protect individuals against genetic discrimination based on their genetic test results.  It impacts both health insurance and employment.  With health insurance, it protects against genetic test results being used for increased premiums or policy termination. For employment, it protects against hiring, firing and promoting decisions based on genetic test results.  GINA does not apply to those in the [employer], those who work for companies with less than 15 employees, and new life insurance or long-term disability insurance policies.  Health status due to a cancer diagnosis is not protected under GINA.  PLAN: After considering the risks, benefits, and limitations, Ms. Torpey provided informed consent to pursue genetic  testing and the blood sample was sent to [facility] for analysis of the Multi-Cancer +RNA Panel. Results should be available within approximately 3 weeks' time, at which point they will be disclosed by telephone to Ms. Towner, as will any additional recommendations warranted by these results. Ms. Jungwirth will receive a summary of her genetic counseling visit and a copy of her results once available. This information will also be available in Epic.   Ms. Tinch questions were answered to her satisfaction today. Our contact information was provided should additional questions or concerns arise. Thank you for the referral and allowing [name] to share in the care of your patient.   Lakela Kuba M. Joette Catching, [place], [facility] Genetic Counselor [email or web address] (P) [phone]   The patient was seen for a total of 40 minutes in face-to-face genetic counseling.  The patient was seen alone.  Drs. Lindi Adie and/or Burr Medico were available to discuss this case as needed.  _______________________________________________________________________ For Office Staff:  Number of people involved in session: 1 Was an Intern/ student involved with case: no

## 2022-05-08 ENCOUNTER — Encounter (HOSPITAL_COMMUNITY): Payer: Self-pay

## 2022-05-08 ENCOUNTER — Other Ambulatory Visit (HOSPITAL_COMMUNITY): Payer: Self-pay

## 2022-05-09 ENCOUNTER — Other Ambulatory Visit (HOSPITAL_COMMUNITY): Payer: Self-pay

## 2022-05-25 ENCOUNTER — Encounter: Payer: Self-pay | Admitting: Genetic Counselor

## 2022-05-25 ENCOUNTER — Telehealth: Payer: Self-pay | Admitting: Genetic Counselor

## 2022-05-25 ENCOUNTER — Ambulatory Visit: Payer: Self-pay | Admitting: Genetic Counselor

## 2022-05-25 DIAGNOSIS — Z8041 Family history of malignant neoplasm of ovary: Secondary | ICD-10-CM

## 2022-05-25 DIAGNOSIS — Z1379 Encounter for other screening for genetic and chromosomal anomalies: Secondary | ICD-10-CM

## 2022-05-25 DIAGNOSIS — Z800 Family history of malignant neoplasm of digestive organs: Secondary | ICD-10-CM

## 2022-05-25 DIAGNOSIS — Z803 Family history of malignant neoplasm of breast: Secondary | ICD-10-CM

## 2022-05-25 NOTE — Progress Notes (Signed)
HPI:   Ms. Munoz was previously seen in the [facility] clinic due to a family history of breast and other cancers and concerns regarding a hereditary predisposition to cancer. Please refer to our prior cancer genetics clinic note for more information regarding our discussion, assessment and recommendations, at the time. Ms. Osoria recent genetic test results were disclosed to her, as were recommendations warranted by these results. These results and recommendations are discussed in more detail below.  CANCER HISTORY:  Last year, Ms. 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       FAMILY HISTORY:  We obtained a detailed, 4-generation family history.  Significant diagnoses are listed below:      Family History  Problem Relation Age of Onset   Ovarian cancer Mother 2   Lung cancer Father          dx 76s   Skin cancer Maternal Uncle          melanoma on back   Breast cancer Paternal Aunt          dx 57s   Lung cancer Paternal Uncle 7        mets to brain   Breast cancer Maternal Grandmother          dx 43s   Colon cancer Other          MGF's brother; dx 72s         In 2021, Ms. Lenahan's mother had negative genetic testing for the [facility] CancerNext-Expanded +RNAinsight Panel.  The CancerNext-Expanded gene panel offered by [facility] and includes sequencing, rearrangement, and RNA analysis for the following 77 genes: AIP, ALK, APC, ATM, AXIN2, BAP1, BARD1, BLM, BMPR1A, BRCA1, BRCA2, BRIP1, CDC73, CDH1, CDK4, CDKN1B, CDKN2A, CHEK2, CTNNA1, DICER1, FANCC, FH, FLCN, GALNT12, KIF1B, LZTR1, MAX, MEN1, MET, MLH1, MSH2, MSH3, MSH6, MUTYH, NBN, NF1, NF2, NTHL1, PALB2, PHOX2B, PMS2, POT1, PRKAR1A, PTCH1, PTEN, RAD51C, RAD51D, RB1, RECQL, RET, SDHA, SDHAF2, SDHB, SDHC, SDHD, SMAD4, SMARCA4, SMARCB1, SMARCE1, STK11, SUFU, TMEM127, TP53, TSC1, TSC2, VHL and XRCC2 (sequencing and deletion/duplication); EGFR, EGLN1, HOXB13, KIT, MITF, PDGFRA, POLD1,  and POLE (sequencing only); EPCAM and GREM1 (deletion/duplication only).    Paternal relatives (father, paternal aunt, etc.) are unavailable for genetic testing at this time.    Ms. Philippi is unaware of other  previous family history of genetic testing for hereditary cancer risks. There is no reported Ashkenazi Jewish ancestry. There is no known consanguinity.  GENETIC TEST RESULTS:  The [facility] Multi-Cancer +RNA Panel found no pathogenic mutations.   The Multi-Cancer + RNA Panel offered by [facility] includes sequencing and/or deletion/duplication analysis of the following 70 genes:  AIP*, ALK, APC*, ATM*, AXIN2*, BAP1*, BARD1*, BLM*, BMPR1A*, BRCA1*, BRCA2*, BRIP1*, CDC73*, CDH1*, CDK4, CDKN1B*, CDKN2A, CHEK2*, CTNNA1*, DICER1*, EPCAM (del/dup only), EGFR, FH*, FLCN*, GREM1 (promoter dup only), HOXB13, KIT, LZTR1, MAX*, MBD4, MEN1*, MET, MITF, MLH1*, MSH2*, MSH3*, MSH6*, MUTYH*, NF1*, NF2*, NTHL1*, PALB2*, PDGFRA, PMS2*, POLD1*, POLE*, POT1*, PRKAR1A*, PTCH1*, PTEN*, RAD51C*, RAD51D*, RB1*, RET, SDHA* (sequencing only), SDHAF2*, SDHB*, SDHC*, SDHD*, SMAD4*, SMARCA4*, SMARCB1*, SMARCE1*, STK11*, SUFU*, TMEM127*, TP53*, TSC1*, TSC2*, VHL*. RNA analysis is performed for * genes.   The test report has been scanned into EPIC and is located under the Molecular Pathology section of the Results Review tab.  A portion of the result report is included below for reference. Genetic testing reported out on May 22, 2022.      Even though a pathogenic variant was not identified, possible explanations  for the cancer in the family may include: There may be no hereditary risk for cancer in the family. The cancers in Ms. Boyson's family may be sporadic/familial or due to other genetic and environmental factors. There may be a gene mutation in one of these genes that current testing methods cannot detect but that chance is small. There could be another gene that has not yet been discovered, or that we have  not yet tested, that is responsible for the cancer diagnoses in the family.  It is also possible there is a hereditary cause for the cancer in the family that Ms. Venters did not inherit.   Therefore, it is important to remain in touch with cancer genetics in the future so that we can continue to offer Ms. Puebla the most up to date genetic testing.     ADDITIONAL GENETIC TESTING:  We discussed with Ms. Shuffler that her genetic testing was fairly extensive.  If there are additional relevant genes identified to increase cancer risk that can be analyzed in the future, we would be happy to discuss and coordinate this testing at that time.      CANCER SCREENING RECOMMENDATIONS:  Ms. Lill test result is considered negative (normal).  This means that we have not identified a hereditary cause for her family history of cancer at this time.   An individual's cancer risk and medical management are not determined by genetic test results alone. Overall cancer risk assessment incorporates additional factors, including personal medical history, family history, and any available genetic information that may result in a personalized plan for cancer prevention and surveillance. Therefore, it is recommended she continue to follow the cancer management and screening guidelines provided by her primary healthcare provider.  Ms. Geniyah Eischeid score was calculate to estimate her lifetime risk for breast cancer.  Her Tyrer Cuzick score, based on available family history and hormonal information, was 9.7%.  Her calculated lifetime risk for breast cancer is similar to that of the average lifetime population risk, indicating no changes in breast cancer management are need at this time based on this calculation alone.  This risk estimate can change over time and may be repeated to reflect new information in her personal or family history in the future.     RECOMMENDATIONS FOR FAMILY MEMBERS:    Since she did not inherit a identifiable mutation in a cancer predisposition gene included on this panel, her children could not have inherited a known mutation from her in one of these genes. Individuals in this family might be at some increased risk of developing cancer, over the general population risk, due to the family history of cancer.  Individuals in the family should notify their providers of the family history of cancer. We recommend women in this family have a yearly mammogram beginning at age 60, or 27 years younger than the earliest onset of cancer, an annual clinical breast exam, and perform monthly breast self-exams.  Other members of the family may still carry a pathogenic variant in one of these genes that Ms. Tungate did not inherit. Based on the family history, we recommend her paternal aunt, who was diagnosed with breast cancer in her 58s.  Ms. Akkerman is not in contact with this family member.     FOLLOW-UP:  Lastly, we discussed with Ms. Crosley that cancer genetics is a rapidly advancing field and it is possible that new genetic tests will be appropriate for her and/or her family members in the future. We  encouraged her to remain in contact with cancer genetics on an annual basis so we can update her personal and family histories and let her know of advances in cancer genetics that may benefit this family.   Our contact number was provided. Ms. Degenhart questions were answered to her satisfaction, and she knows she is welcome to call us at anytime with additional questions or concerns.   Zuleyka Kloc M. Joette Catching, [place], [facility] Genetic Counselor [email or web address] (P) [phone]

## 2022-05-25 NOTE — Telephone Encounter (Signed)
Revealed negative genetics.   

## 2022-05-30 ENCOUNTER — Ambulatory Visit: Payer: Commercial Managed Care - HMO | Attending: Family Medicine | Admitting: Family Medicine

## 2022-05-30 ENCOUNTER — Other Ambulatory Visit: Payer: Self-pay

## 2022-05-30 ENCOUNTER — Encounter: Payer: Self-pay | Admitting: Family Medicine

## 2022-05-30 VITALS — BP 122/80 | HR 60 | Temp 98.1°F | Ht 64.0 in | Wt 196.2 lb

## 2022-05-30 DIAGNOSIS — E7800 Pure hypercholesterolemia, unspecified: Secondary | ICD-10-CM

## 2022-05-30 DIAGNOSIS — E039 Hypothyroidism, unspecified: Secondary | ICD-10-CM

## 2022-05-30 DIAGNOSIS — J018 Other acute sinusitis: Secondary | ICD-10-CM

## 2022-05-30 DIAGNOSIS — N3281 Overactive bladder: Secondary | ICD-10-CM

## 2022-05-30 DIAGNOSIS — F319 Bipolar disorder, unspecified: Secondary | ICD-10-CM

## 2022-05-30 DIAGNOSIS — N951 Menopausal and female climacteric states: Secondary | ICD-10-CM

## 2022-05-30 MED ORDER — SOLIFENACIN SUCCINATE 5 MG PO TABS: 5.0000 mg | ORAL_TABLET | Freq: Every day | ORAL | 6 refills | Status: DC

## 2022-05-30 MED ORDER — VEOZAH 45 MG PO TABS: 45.0000 mg | ORAL_TABLET | Freq: Every day | ORAL | 5 refills | Status: DC

## 2022-05-30 MED ORDER — FLUTICASONE PROPIONATE 50 MCG/ACT NA SUSP: 2.0000 | Freq: Every day | NASAL | 1 refills | Status: DC

## 2022-05-30 MED ORDER — CETIRIZINE HCL 10 MG PO TABS: 10.0000 mg | ORAL_TABLET | Freq: Every day | ORAL | 1 refills | Status: DC

## 2022-05-30 NOTE — Progress Notes (Signed)
Cough and sore throat.

## 2022-05-30 NOTE — Patient Instructions (Signed)
Fezolinetant Tablets What is this medication? FEZOLINETANT (FEZ oh LIN e tant) reduces the number and severity of hot flashes due to menopause. It works by blocking substances in your body that cause hot flashes and night sweats. This medicine may be used for other purposes; ask your health care provider or pharmacist if you have questions. COMMON BRAND NAME(S): VEOZAH What should I tell my care team before I take this medication? They need to know if you have any of these conditions: Kidney disease Liver disease An unusual or allergic reaction to fezolinetant, other medications, foods, dyes, or preservatives Pregnant or trying to get pregnant Breastfeeding How should I use this medication? Take this medication by mouth with water. Take it as directed on the prescription label at the same time every day. Do not cut, crush, or chew this medication. Swallow the tablets whole. You can take it with or without food. If it upsets your stomach, take it with food. Keep taking it unless your care team tells you to stop. Talk to your care team about the use of this medication in children. Special care may be needed. Overdosage: If you think you have taken too much of this medicine contact a poison control center or emergency room at once. NOTE: This medicine is only for you. Do not share this medicine with others. What if I miss a dose? If you miss a dose, take it as soon as you can unless it is more than 12 hours late. If it is more than 12 hours late, skip the missed dose. Take the next dose at the normal time. What may interact with this medication? Other medications may affect the way this medication works. Talk with your care team about all of the medications you take. They may suggest changes to your treatment plan to lower the risk of side effects and to make sure your medications work as intended. This list may not describe all possible interactions. Give your health care provider a list of all  the medicines, herbs, non-prescription drugs, or dietary supplements you use. Also tell them if you smoke, drink alcohol, or use illegal drugs. Some items may interact with your medicine. What should I watch for while using this medication? Visit your care team for regular checks on your progress. Tell your care team if your symptoms do not start to get better or if they get worse. You may need blood work while taking this medication. What side effects may I notice from receiving this medication? Side effects that you should report to your care team as soon as possible: Allergic reactions--skin rash, itching, hives, swelling of the face, lips, tongue, or throat Liver injury--right upper belly pain, loss of appetite, nausea, light-colored stool, dark yellow or brown urine, yellowing skin or eyes, unusual weakness or fatigue Side effects that usually do not require medical attention (report these to your care team if they continue or are bothersome): Back pain Diarrhea Hot flashes Stomach pain Trouble sleeping This list may not describe all possible side effects. Call your doctor for medical advice about side effects. You may report side effects to FDA at 1-800-FDA-1088. Where should I keep my medication? Keep out of the reach of children and pets. Store at room temperature between 20 and 25 degrees C (68 and 77 degrees F). Get rid of any unused medication after the expiration date. To get rid of medications that are no longer needed or have expired: Take the medication to a medication take-back program. Check with   your pharmacy or law enforcement to find a location. If you cannot return the medication, check the label or package insert to see if the medication should be thrown out in the garbage or flushed down the toilet. If you are not sure, ask your care team. If it is safe to put it in the trash, take the medication out of the container. Mix the medication with cat litter, dirt, coffee  grounds, or other unwanted substance. Seal the mixture in a bag or container. Put it in the trash. NOTE: This sheet is a summary. It may not cover all possible information. If you have questions about this medicine, talk to your doctor, pharmacist, or health care provider.  2023 Elsevier/Gold Standard (2021-10-26 00:00:00)  

## 2022-05-30 NOTE — Progress Notes (Signed)
Subjective:  Patient ID: Kristen Moore, female    DOB: [birth year]  Age: 51 y.o. MRN: [id number]  CC: Hypothyroidism   HPI Kristen Moore is a 51 y.o. year old female with a history of bipolar depression, hypothyroidism, hyperlipidemia, tobacco abuse, asthma, BCC of the face s/p excision.   Interval History:  Since yesterday she has had a cough which is dry and sore throat. Has also noticed tearing from her eye, also had a subjective fever, some body aches. She has had wheezing which is chronic but no dyspnea  She would like pills for hot flashes as they have become bothersome and she would like to be prescribed the new medication approved by the FDA for treatment of hot flashes. She endorses presence of urge incontinence and has had accidents in the past and wet her clothes in the floor in the process.  She would like treatment for this.  Endorses adherence with levothyroxine and her statin. Bipolar depression is managed by [facility] behavioral health. With regards to tobacco she is not ready to quit as she complains of being under a lot of stress at this time.  She remains on Wellbutrin. Past Medical History:  Diagnosis Date   Abnormal Pap smear    cryo   Anemia    Asthma    as a child   Bipolar 1 disorder ([place])    Cyst of breast    left breast   DUB (dysfunctional uterine bleeding)    Fibroids    Headache(784.0)    Hypothyroidism    IBS (irritable bowel syndrome)    Thyroid dysfunction     Past Surgical History:  Procedure Laterality Date   ABDOMINAL HYSTERECTOMY Bilateral 12/25/2012   Procedure: HYSTERECTOMY ABDOMINAL;  Surgeon: Emily Filbert, MD;  Location: [facility];  Service: Gynecology;  Laterality: Bilateral;  TAH and bilateral salpingectomy   BILATERAL SALPINGECTOMY Bilateral 12/25/2012   Procedure: BILATERAL SALPINGECTOMY;  Surgeon: Emily Filbert, MD;  Location: [facility];  Service: Gynecology;  Laterality: Bilateral;   CERVICAL BIOPSY  W/ LOOP  ELECTRODE EXCISION     CHOLECYSTECTOMY     COLONOSCOPY     10 + years ago   DILATION AND CURETTAGE OF UTERUS     OOPHORECTOMY Bilateral 12/25/2012   Procedure: OOPHORECTOMY;  Surgeon: Emily Filbert, MD;  Location: [facility];  Service: Gynecology;  Laterality: Bilateral;   TUBAL LIGATION     UPPER GASTROINTESTINAL ENDOSCOPY     10 + years    Family History  Problem Relation Age of Onset   Hypertension Mother    Rheum arthritis Mother    Fibromyalgia Mother    COPD Mother    Sleep apnea Mother    Thyroid disease Mother    Ovarian cancer Mother 33   Heart disease Father    Lung cancer Father        dx 24s   Skin cancer Maternal Uncle        melanoma on back   Breast cancer Paternal Aunt        dx 5s   Lung cancer Paternal Uncle 40       mets to brain   Breast cancer Maternal Grandmother        dx 64s   Colon cancer Other        MGF's brother; dx 49s   Esophageal cancer Neg Hx    Stomach cancer Neg Hx     Social History   Socioeconomic History  Marital status: Single    Spouse name: Not on file   Number of children: 1   Years of education: Not on file   Highest education level: Associate degree: occupational, Hotel manager, or vocational program  Occupational History   Not on file  Tobacco Use   Smoking status: Every Day    Packs/day: 1.00    Years: 9.00    Total pack years: 9.00    Types: Cigarettes   Smokeless tobacco: Never  Vaping Use   Vaping Use: Never used  Substance and Sexual Activity   Alcohol use: No    Alcohol/week: 0.0 standard drinks of alcohol   Drug use: No   Sexual activity: Not Currently    Birth control/protection: Surgical  Other Topics Concern   Not on file  Social History Narrative   Mother has ovarian cancer that has spread to lungs (passed April 2023), Dad has lung cancer that has spread to kidneys, and disabled brother all live in home with patient. Patient is primary caregiver. Daughter is drug addict, and has loss custody of patient's  infant granddaughter in 02/2020. 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 Patient is very stressed and saddened about her current situation.    Social Determinants of Health   Financial Resource Strain: Not on file  Food Insecurity: No Food Insecurity (06/20/2021)   Hunger Vital Sign    Worried About Running Out of Food in the Last Year: Never true    Ran Out of Food in the Last Year: Never true  Transportation Needs: Unmet Transportation Needs (06/20/2021)   PRAPARE - Hydrologist (Medical): Yes    Lack of Transportation (Non-Medical): Yes  Physical Activity: Not on file  Stress: Not on file  Social Connections: Not on file    Allergies  Allergen Reactions   Bee Venom Swelling and Anaphylaxis   Latex Swelling   Penicillins Swelling    Has patient had a PCN reaction causing immediate rash, facial/tongue/throat swelling, SOB or lightheadedness with hypotension: yes Has patient had a PCN reaction causing severe rash involving mucus membranes or skin necrosis: no Has patient had a PCN reaction that required hospitalization no Has patient had a PCN reaction occurring within the last 10 years: no If all of the above answers are "NO", then may proceed with Cephalosporin   Ibuprofen Swelling   Omega-3 Hives and Swelling    Potatoes     Other Hives and Swelling    Potatoes       Outpatient Medications Prior to Visit  Medication Sig Dispense Refill   atorvastatin (LIPITOR) 40 MG tablet TAKE 1 TABLET (40 MG TOTAL) BY MOUTH DAILY. 30 tablet 6   buPROPion (WELLBUTRIN XL) 300 MG 24 hr tablet Take 1 tablet (300 mg total) by mouth daily. 30 tablet 2   hydrOXYzine (ATARAX) 10 MG tablet Take 1/2 to 1 tablet (5 mg to 10 mg) up to three times a day as needed for anxiety or sleep. 60 tablet 2   levothyroxine (SYNTHROID) 150 MCG tablet Take 1 tablet (150 mcg total) by mouth daily. 30 tablet 6   lithium carbonate 300 MG capsule Take 1 capsule (300 mg  total) by mouth in the morning AND 2 capsules (600 mg total) at bedtime. 90 capsule 2   Multiple Vitamins-Minerals (ONE-A-DAY WOMENS PO) Take by mouth.     nicotine (NICODERM CQ) 14 mg/24hr patch Place 1 patch (14 mg total) onto the skin daily. Then decrease to 7  mg per 24 hours 28 patch 2   QUEtiapine (SEROQUEL) 50 MG tablet Take 1/2 tablet (25 mg total) nightly for 6 days then INCREASE to 1 tablet (50 mg total) nightly thereafter. 30 tablet 2   buPROPion (WELLBUTRIN XL) 300 MG 24 hr tablet Take 1 tablet (300 mg total) by mouth daily. 30 tablet 3   hydrOXYzine (ATARAX) 10 MG tablet Take 0.5 tablets (5 mg total) by mouth 3 (three) times daily as needed. 45 tablet 3   lithium carbonate 300 MG capsule TAKE 1 CAPSULE BY MOUTH EVERY MORNING & TAKE 2 CAPSULES EVERY NIGHT AT BEDTIME 90 capsule 3   No facility-administered medications prior to visit.     ROS Review of Systems  Constitutional:  Negative for activity change and appetite change.  HENT:  Positive for sore throat. Negative for sinus pressure.   Respiratory:  Positive for cough. Negative for chest tightness, shortness of breath and wheezing.   Cardiovascular:  Negative for chest pain and palpitations.  Gastrointestinal:  Negative for abdominal distention, abdominal pain and constipation.  Genitourinary: Negative.   Musculoskeletal: Negative.   Psychiatric/Behavioral:  Negative for behavioral problems and dysphoric mood.     Objective:  BP 122/80   Pulse 60   Temp 98.1 F (36.7 C) (Oral)   Ht _0  (1.626 m)   Wt 196 lb 3.2 oz (89 kg)   LMP 11/29/2012   SpO2 98%   BMI 33.68 kg/m      05/30/2022    3:16 PM 05/04/2022   12:17 PM 05/04/2022   12:13 PM  BP/Weight  Systolic BP 094 709   Diastolic BP 80 88   Wt. (Lbs) 196.2  198  BMI 33.68 kg/m2  33.99 kg/m2      Physical Exam Constitutional:      Appearance: She is well-developed.  Cardiovascular:     Rate and Rhythm: Normal rate.     Heart sounds: Normal heart  sounds. No murmur heard. Pulmonary:     Effort: Pulmonary effort is normal.     Breath sounds: Normal breath sounds. No wheezing or rales.  Chest:     Chest wall: No tenderness.  Abdominal:     General: Bowel sounds are normal. There is no distension.     Palpations: Abdomen is soft. There is no mass.     Tenderness: There is no abdominal tenderness.  Musculoskeletal:        General: Normal range of motion.     Right lower leg: No edema.     Left lower leg: No edema.  Neurological:     Mental Status: She is alert and oriented to person, place, and time.  Psychiatric:        Mood and Affect: Mood normal.        Latest Ref Rng & Units 11/30/2021   11:16 AM 01/26/2021    9:49 AM 03/18/2020    9:52 AM  CMP  Glucose 70 - 99 mg/dL 90  91  102   BUN 6 - 24 mg/dL _1 Creatinine 0.57 - 1.00 mg/dL 0.81  0.71  0.84   Sodium 134 - 144 mmol/L 139  142  139   Potassium 3.5 - 5.2 mmol/L 4.2  4.3  4.0   Chloride 96 - 106 mmol/L 105  106  105   CO2 20 - 29 mmol/L _2 Calcium 8.7 - 10.2 mg/dL 9.8  9.9  9.9  Total Protein 6.0 - 8.5 g/dL 6.5  6.5    Total Bilirubin 0.0 - 1.2 mg/dL 0.6  0.4    Alkaline Phos 44 - 121 IU/L 74  77    AST 0 - 40 IU/L 16  14    ALT 0 - 32 IU/L 17  17      Lipid Panel     Component Value Date/Time   CHOL 112 11/30/2021 1116   TRIG 74 11/30/2021 1116   HDL 44 11/30/2021 1116   CHOLHDL 2.3 01/26/2021 0949   CHOLHDL 5.1 04/18/2018 1056   VLDL 56 (H) 04/18/2018 1056   LDLCALC 53 11/30/2021 1116    CBC    Component Value Date/Time   WBC 5.6 05/26/2021 1115   WBC 8.3 03/18/2020 0952   RBC 4.79 05/26/2021 1115   RBC 4.40 03/18/2020 0952   HGB 15.0 05/26/2021 1115   HCT 44.9 05/26/2021 1115   PLT 200 05/26/2021 1115   MCV 94 05/26/2021 1115   MCH 31.3 05/26/2021 1115   MCH 32.5 03/18/2020 0952   MCHC 33.4 05/26/2021 1115   MCHC 33.3 03/18/2020 0952   RDW 11.9 05/26/2021 1115   LYMPHSABS 1.3 05/26/2021 1115   MONOABS 0.5 03/18/2020  0952   EOSABS 0.1 05/26/2021 1115   BASOSABS 0.0 05/26/2021 1115    Lab Results  Component Value Date   HGBA1C 5.1 04/25/2020   Lab Results  Component Value Date   TSH 2.040 11/30/2021    Assessment & Plan:  1. Hypothyroidism, unspecified type Controlled We will check thyroid panel and adjust regimen accordingly - T4, free - TSH - T3 - CMP14+EGFR  2. Pure hypercholesterolemia Controlled Continue current dose of atorvastatin Low-cholesterol diet  3. Bipolar depression (HCC) Stable Continue Seroquel, lithium as per behavioral health - Lithium level - CBC with Differential/Platelet  4. Acute non-recurrent sinusitis of other sinus Could explain cough and sore throat Placed on Zyrtec and Flonase  5. Overactive bladder She is aware of Kegel exercises but these have not been helpful Will place on Vesicare - solifenacin (VESICARE) 5 MG tablet; Take 1 tablet (5 mg total) by mouth daily.  Dispense: 30 tablet; Refill: 6  6. Menopausal vasomotor syndrome She would like to try Veozah and this has been prescribed. - Fezolinetant (VEOZAH) 45 MG TABS; Take 45 mg by mouth daily. Hot flashes  Dispense: 30 tablet; Refill: 5    Meds ordered this encounter  Medications   Fezolinetant (VEOZAH) 45 MG TABS    Sig: Take 45 mg by mouth daily. Hot flashes    Dispense:  30 tablet    Refill:  5   solifenacin (VESICARE) 5 MG tablet    Sig: Take 1 tablet (5 mg total) by mouth daily.    Dispense:  30 tablet    Refill:  6   cetirizine (ZYRTEC) 10 MG tablet    Sig: Take 1 tablet (10 mg total) by mouth daily.    Dispense:  30 tablet    Refill:  1   fluticasone (FLONASE) 50 MCG/ACT nasal spray    Sig: Place 2 sprays into both nostrils daily.    Dispense:  16 g    Refill:  1    Follow-up: Return in about 6 months (around 11/29/2022) for Chronic medical conditions.       Charlott Rakes, MD, FAAFP. [facility] [place], [place]    05/30/2022, 3:46 PM

## 2022-05-31 ENCOUNTER — Other Ambulatory Visit: Payer: Self-pay | Admitting: Family Medicine

## 2022-05-31 DIAGNOSIS — E039 Hypothyroidism, unspecified: Secondary | ICD-10-CM

## 2022-05-31 LAB — CBC WITH DIFFERENTIAL/PLATELET
Basophils Absolute: 0 10*3/uL (ref 0.0–0.2)
Basos: 1 %
EOS (ABSOLUTE): 0.1 10*3/uL (ref 0.0–0.4)
Eos: 2 %
Hematocrit: 42.1 % (ref 34.0–46.6)
Hemoglobin: 14.5 g/dL (ref 11.1–15.9)
Immature Grans (Abs): 0 10*3/uL (ref 0.0–0.1)
Immature Granulocytes: 0 %
Lymphocytes Absolute: 1.3 10*3/uL (ref 0.7–3.1)
Lymphs: 26 %
MCH: 32.6 pg (ref 26.6–33.0)
MCHC: 34.4 g/dL (ref 31.5–35.7)
MCV: 95 fL (ref 79–97)
Monocytes Absolute: 0.3 10*3/uL (ref 0.1–0.9)
Monocytes: 6 %
Neutrophils Absolute: 3.4 10*3/uL (ref 1.4–7.0)
Neutrophils: 65 %
Platelets: 187 10*3/uL (ref 150–450)
RBC: 4.45 x10E6/uL (ref 3.77–5.28)
RDW: 11.8 % (ref 11.7–15.4)
WBC: 5.2 10*3/uL (ref 3.4–10.8)

## 2022-05-31 LAB — CMP14+EGFR
ALT: 17 IU/L (ref 0–32)
AST: 15 IU/L (ref 0–40)
Albumin/Globulin Ratio: 2.3 — ABNORMAL HIGH (ref 1.2–2.2)
Albumin: 4.5 g/dL (ref 3.9–4.9)
Alkaline Phosphatase: 80 IU/L (ref 44–121)
BUN/Creatinine Ratio: 12 (ref 9–23)
BUN: 10 mg/dL (ref 6–24)
Bilirubin Total: 0.3 mg/dL (ref 0.0–1.2)
CO2: 23 mmol/L (ref 20–29)
Calcium: 10.1 mg/dL (ref 8.7–10.2)
Chloride: 106 mmol/L (ref 96–106)
Creatinine, Ser: 0.85 mg/dL (ref 0.57–1.00)
Globulin, Total: 2 g/dL (ref 1.5–4.5)
Glucose: 92 mg/dL (ref 70–99)
Potassium: 4.5 mmol/L (ref 3.5–5.2)
Sodium: 142 mmol/L (ref 134–144)
Total Protein: 6.5 g/dL (ref 6.0–8.5)
eGFR: 84 mL/min/{1.73_m2} (ref >59)

## 2022-05-31 LAB — LITHIUM LEVEL: Lithium Lvl: 0.7 mmol/L (ref 0.5–1.2)

## 2022-05-31 LAB — T4, FREE: Free T4: 1.63 ng/dL (ref 0.82–1.77)

## 2022-05-31 LAB — TSH: TSH: 0.444 u[IU]/mL — ABNORMAL LOW (ref 0.450–4.500)

## 2022-05-31 LAB — T3: T3, Total: 120 ng/dL (ref 71–180)

## 2022-05-31 MED ORDER — LEVOTHYROXINE SODIUM 137 MCG PO TABS: 137.0000 ug | ORAL_TABLET | Freq: Every day | ORAL | 3 refills | Status: DC

## 2022-06-05 ENCOUNTER — Other Ambulatory Visit (HOSPITAL_COMMUNITY): Payer: Self-pay | Admitting: Psychiatry

## 2022-06-05 ENCOUNTER — Other Ambulatory Visit: Payer: Self-pay | Admitting: Family Medicine

## 2022-06-05 ENCOUNTER — Other Ambulatory Visit (HOSPITAL_COMMUNITY): Payer: Self-pay

## 2022-06-05 DIAGNOSIS — E039 Hypothyroidism, unspecified: Secondary | ICD-10-CM

## 2022-06-05 DIAGNOSIS — F319 Bipolar disorder, unspecified: Secondary | ICD-10-CM

## 2022-06-05 MED ORDER — LITHIUM CARBONATE 300 MG PO CAPS
ORAL_CAPSULE | ORAL | 3 refills | Status: DC
  Filled 2022-06-05 – 2022-06-08 (×2): qty 90, 30d supply, fill #0
  Filled 2022-07-10 – 2022-07-16 (×2): qty 90, 30d supply, fill #1

## 2022-06-06 ENCOUNTER — Other Ambulatory Visit (HOSPITAL_COMMUNITY): Payer: Self-pay

## 2022-06-08 ENCOUNTER — Other Ambulatory Visit (HOSPITAL_COMMUNITY): Payer: Self-pay

## 2022-06-08 ENCOUNTER — Other Ambulatory Visit: Payer: Self-pay | Admitting: Family Medicine

## 2022-06-08 DIAGNOSIS — E039 Hypothyroidism, unspecified: Secondary | ICD-10-CM

## 2022-06-08 MED ORDER — LEVOTHYROXINE SODIUM 137 MCG PO TABS
137.0000 ug | ORAL_TABLET | Freq: Every day | ORAL | 3 refills | Status: DC
  Filled 2022-06-08: qty 30, 30d supply, fill #0
  Filled 2022-07-10 – 2022-07-16 (×2): qty 30, 30d supply, fill #1
  Filled 2022-08-09: qty 30, 30d supply, fill #2
  Filled 2022-09-12: qty 30, 30d supply, fill #3

## 2022-06-08 NOTE — Telephone Encounter (Signed)
[facility] to verify if patient has picked up medication. Pharmacy unable to take call (possible lunch) requesting to leave message. Did not leave message.

## 2022-06-08 NOTE — Telephone Encounter (Unsigned)
Copied from [place] [id number]. Topic: General - Other >> Jun 08, 2022  9:47 AM Everette C wrote: Medication Refill - Medication: levothyroxine (SYNTHROID) 137 MCG tablet [[id number]]  Has the patient contacted their pharmacy? Yes.  The patient has been directed to contact their PCP  (Agent: If no, request that the patient contact the pharmacy for the refill. If patient does not wish to contact the pharmacy document the reason why and proceed with request.) (Agent: If yes, when and what did the pharmacy advise?)  Preferred Pharmacy (with phone number or street name): [facility] [place] [address] Phone: [phone] Fax: [phone] Hours: Mon-Fri 7:30am-6pm; Sat 8:00am-4:30pm   Has the patient been seen for an appointment in the last year OR does the patient have an upcoming appointment? Yes.    Agent: Please be advised that RX refills may take up to 3 business days. We ask that you follow-up with your pharmacy.

## 2022-06-08 NOTE — Telephone Encounter (Signed)
Requesting different pharmacy to refill Rx Requested Prescriptions  Pending Prescriptions Disp Refills   levothyroxine (SYNTHROID) 137 MCG tablet 30 tablet 3    Sig: Take 1 tablet (137 mcg total) by mouth daily.     Endocrinology:  Hypothyroid Agents Failed - 06/08/2022 11:38 AM      Failed - TSH in normal range and within 360 days    TSH  Date Value Ref Range Status  05/30/2022 0.444 (L) 0.450 - 4.500 uIU/mL Final         Passed - Valid encounter within last 12 months    Recent Outpatient Visits           1 week ago Hypothyroidism, unspecified type   [place], Enobong, MD   6 months ago Bipolar depression [facility])   [place], Enobong, MD   10 months ago Urine frequency   [facility], Kriste Basque, NP   1 year ago Hypothyroidism, unspecified type   [place], Enobong, MD   1 year ago Acute upper respiratory infection   [place], PA-C       Future Appointments             In 5 months Charlott Rakes, MD [place]

## 2022-06-13 ENCOUNTER — Other Ambulatory Visit (HOSPITAL_COMMUNITY): Payer: Self-pay

## 2022-06-21 ENCOUNTER — Encounter (HOSPITAL_COMMUNITY): Payer: Self-pay

## 2022-06-21 ENCOUNTER — Telehealth (HOSPITAL_COMMUNITY): Payer: Commercial Managed Care - HMO | Admitting: Student in an Organized Health Care Education/Training Program

## 2022-06-22 ENCOUNTER — Ambulatory Visit
Admission: RE | Admit: 2022-06-22 | Discharge: 2022-06-22 | Disposition: A | Discharge status: Home / Self Care | Payer: Commercial Managed Care - HMO | Source: Ambulatory Visit | Attending: Physician Assistant | Admitting: Physician Assistant

## 2022-06-22 ENCOUNTER — Ambulatory Visit: Payer: Commercial Managed Care - HMO | Admitting: Nurse Practitioner

## 2022-06-22 DIAGNOSIS — N6320 Unspecified lump in the left breast, unspecified quadrant: Secondary | ICD-10-CM

## 2022-06-27 ENCOUNTER — Emergency Department (HOSPITAL_BASED_OUTPATIENT_CLINIC_OR_DEPARTMENT_OTHER)
Admission: EM | Admit: 2022-06-27 | Discharge: 2022-06-27 | Disposition: A | Discharge status: Home / Self Care | Payer: Commercial Managed Care - HMO | Attending: Emergency Medicine | Admitting: Emergency Medicine

## 2022-06-27 ENCOUNTER — Encounter (HOSPITAL_BASED_OUTPATIENT_CLINIC_OR_DEPARTMENT_OTHER): Payer: Self-pay

## 2022-06-27 ENCOUNTER — Other Ambulatory Visit (HOSPITAL_BASED_OUTPATIENT_CLINIC_OR_DEPARTMENT_OTHER): Payer: Self-pay

## 2022-06-27 ENCOUNTER — Emergency Department (HOSPITAL_BASED_OUTPATIENT_CLINIC_OR_DEPARTMENT_OTHER): Payer: Commercial Managed Care - HMO

## 2022-06-27 DIAGNOSIS — J069 Acute upper respiratory infection, unspecified: Secondary | ICD-10-CM | POA: Insufficient documentation

## 2022-06-27 DIAGNOSIS — R059 Cough, unspecified: Secondary | ICD-10-CM | POA: Diagnosis present

## 2022-06-27 DIAGNOSIS — Z1152 Encounter for screening for COVID-19: Secondary | ICD-10-CM | POA: Diagnosis not present

## 2022-06-27 DIAGNOSIS — M79672 Pain in left foot: Secondary | ICD-10-CM | POA: Diagnosis not present

## 2022-06-27 DIAGNOSIS — J029 Acute pharyngitis, unspecified: Secondary | ICD-10-CM

## 2022-06-27 DIAGNOSIS — Z91040 Latex allergy status: Secondary | ICD-10-CM | POA: Diagnosis not present

## 2022-06-27 DIAGNOSIS — B9789 Other viral agents as the cause of diseases classified elsewhere: Secondary | ICD-10-CM | POA: Diagnosis not present

## 2022-06-27 DIAGNOSIS — W208XXA Other cause of strike by thrown, projected or falling object, initial encounter: Secondary | ICD-10-CM | POA: Insufficient documentation

## 2022-06-27 LAB — GROUP A STREP BY PCR: Group A Strep by PCR: NOT DETECTED

## 2022-06-27 LAB — RESP PANEL BY RT-PCR (RSV, FLU A&B, COVID)  RVPGX2
Influenza A by PCR: NEGATIVE
Influenza B by PCR: NEGATIVE
Resp Syncytial Virus by PCR: NEGATIVE
SARS Coronavirus 2 by RT PCR: NEGATIVE

## 2022-06-27 MED ORDER — ONDANSETRON 4 MG PO TBDP
ORAL_TABLET | ORAL | 0 refills | Status: DC
  Filled 2022-06-27: qty 20, 4d supply, fill #0

## 2022-06-27 MED ORDER — BENZONATATE 100 MG PO CAPS
100.0000 mg | ORAL_CAPSULE | Freq: Three times a day (TID) | ORAL | 0 refills | Status: DC
  Filled 2022-06-27: qty 21, 7d supply, fill #0

## 2022-06-27 NOTE — ED Triage Notes (Addendum)
Presents with complaints of sore throat, cough and nasal drainage  since monday Also wants left foot evaluated  dropped piece of wood last Thursday. Foot still sore

## 2022-06-27 NOTE — Discharge Instructions (Signed)
Take tylenol 2 pills 4 times a day and motrin 4 pills 3 times a day.  Drink plenty of fluids.  Return for worsening shortness of breath, headache, confusion. Follow up with your family doctor.  ° °

## 2022-06-27 NOTE — ED Provider Notes (Signed)
[facility] Provider Note   CSN: [id number] Arrival date & time: 06/27/22  1100     History  Chief Complaint  Patient presents with   Sore Throat   Cough    Kristen Moore is a 52 y.o. female.  52 yo F with a chief complaint of cough congestion sore throat.  Going on for about 3 days now.  She is missed a couple days of work and is here to get a work note to go back.  She also dropped a piece of wood on her foot a few weeks ago but still is hurting from time to time and thought she might get an x-ray while she is here.  She has been able to ambulate without issue.  Feels like the cough is worse at night and then has had some chest discomfort with it recently.   Sore Throat  Cough      Home Medications Prior to Admission medications   Medication Sig Start Date End Date Taking? Authorizing Provider  benzonatate (TESSALON) 100 MG capsule Take 1 capsule (100 mg total) by mouth every 8 (eight) hours. 06/27/22  Yes Deno Etienne, DO  ondansetron (ZOFRAN-ODT) 4 MG disintegrating tablet '4mg'$  ODT q4 hours prn nausea/vomit 06/27/22  Yes Tyrone Nine, Jazariah Teall, DO  atorvastatin (LIPITOR) 40 MG tablet TAKE 1 TABLET (40 MG TOTAL) BY MOUTH DAILY. 11/29/21   Charlott Rakes, MD  buPROPion (WELLBUTRIN XL) 300 MG 24 hr tablet Take 1 tablet (300 mg total) by mouth daily. 02/01/22   Salley Slaughter, NP  buPROPion (WELLBUTRIN XL) 300 MG 24 hr tablet Take 1 tablet (300 mg total) by mouth daily. 04/16/22 07/15/22  Bahraini, Sarah A  cetirizine (ZYRTEC) 10 MG tablet Take 1 tablet (10 mg total) by mouth daily. 05/30/22   Charlott Rakes, MD  Fezolinetant (VEOZAH) 45 MG TABS Take 45 mg by mouth daily. Hot flashes 05/30/22   Charlott Rakes, MD  fluticasone (FLONASE) 50 MCG/ACT nasal spray Place 2 sprays into both nostrils daily. 05/30/22   Charlott Rakes, MD  hydrOXYzine (ATARAX) 10 MG tablet Take 0.5 tablets (5 mg total) by mouth 3 (three) times daily as needed.  02/01/22   Salley Slaughter, NP  hydrOXYzine (ATARAX) 10 MG tablet Take 1/2 to 1 tablet (5 mg to 10 mg) up to three times a day as needed for anxiety or sleep. 04/16/22   Bahraini, Leary Roca  levothyroxine (SYNTHROID) 137 MCG tablet Take 1 tablet (137 mcg total) by mouth daily. 06/08/22   Charlott Rakes, MD  lithium carbonate 300 MG capsule Take 1 capsule (300 mg total) by mouth in the morning AND 2 capsules (600 mg total) at bedtime. 04/16/22 07/15/22  Bahraini, Sarah A  lithium carbonate 300 MG capsule TAKE 1 CAPSULE BY MOUTH EVERY MORNING & TAKE 2 CAPSULES EVERY NIGHT AT BEDTIME 06/05/22   Salley Slaughter, NP  Multiple Vitamins-Minerals (ONE-A-DAY WOMENS PO) Take by mouth.    [provider]  nicotine (NICODERM CQ) 14 mg/24hr patch Place 1 patch (14 mg total) onto the skin daily. Then decrease to 7 mg per 24 hours 02/01/22   Salley Slaughter, NP  QUEtiapine (SEROQUEL) 50 MG tablet Take 1/2 tablet (25 mg total) nightly for 6 days then INCREASE to 1 tablet (50 mg total) nightly thereafter. 04/16/22   Bahraini, Sarah A  solifenacin (VESICARE) 5 MG tablet Take 1 tablet (5 mg total) by mouth daily. 05/30/22   Charlott Rakes, MD  Allergies    Bee venom, Latex, Penicillins, Ibuprofen, Omega-3, and Other    Review of Systems   Review of Systems  Respiratory:  Positive for cough.     Physical Exam Updated Vital Signs BP 127/72 (BP Location: Right Arm)   Pulse 61   Temp 98.3 F (36.8 C) (Oral)   Resp 17   Wt 88.9 kg   LMP 11/29/2012   SpO2 98%   BMI 33.64 kg/m  Physical Exam Vitals and nursing note reviewed.  Constitutional:      General: She is not in acute distress.    Appearance: She is well-developed. She is not diaphoretic.  HENT:     Head: Normocephalic and atraumatic.     Comments: Swollen turbinates, posterior nasal drip, no noted sinus ttp, tm normal bilaterally.   Eyes:     Pupils: Pupils are equal, round, and reactive to light.  Cardiovascular:     Rate  and Rhythm: Normal rate and regular rhythm.     Heart sounds: No murmur heard.    No friction rub. No gallop.  Pulmonary:     Effort: Pulmonary effort is normal.     Breath sounds: No wheezing or rales.  Abdominal:     General: There is no distension.     Palpations: Abdomen is soft.     Tenderness: There is no abdominal tenderness.  Musculoskeletal:        General: No tenderness.     Cervical back: Normal range of motion and neck supple.     Comments: Patient with some mild tenderness to the dorsum of the left foot.  No pain along the base of the fifth metatarsal or the navicular.  Pulse motor and sensation intact distally.  No pain at the ankle.  No edema.  No erythema.  Skin:    General: Skin is warm and dry.  Neurological:     Mental Status: She is alert and oriented to person, place, and time.  Psychiatric:        Behavior: Behavior normal.     ED Results / Procedures / Treatments   Labs (all labs ordered are listed, but only abnormal results are displayed) Labs Reviewed  RESP PANEL BY RT-PCR (RSV, FLU A&B, COVID)  [id number]  GROUP A STREP BY PCR    EKG None  Radiology DG Foot Complete Left  Result Date: 06/27/2022 CLINICAL DATA:  Trauma, injury EXAM: LEFT FOOT - COMPLETE 3+ VIEW COMPARISON:  None Available. FINDINGS: There is no evidence of fracture or dislocation. There is no evidence of arthropathy or other focal bone abnormality. Soft tissues are unremarkable. IMPRESSION: No acute abnormality Electronically Signed   By: Jerilynn Mages.  Shick M.D.   On: 06/27/2022 12:22   DG Chest 2 View  Result Date: 06/27/2022 CLINICAL DATA:  sore throat EXAM: CHEST - 2 VIEW COMPARISON:  03/18/2020 FINDINGS: The heart size and mediastinal contours are within normal limits. Both lungs are clear. The visualized skeletal structures are unremarkable. Remote cholecystectomy noted. IMPRESSION: No active cardiopulmonary disease. Electronically Signed   By: Jerilynn Mages.  Shick M.D.   On: 06/27/2022 12:21     Procedures Procedures    Medications Ordered in ED Medications - No data to display  ED Course/ Medical Decision Making/ A&P                             Medical Decision Making Amount and/or Complexity of Data Reviewed Radiology: ordered.  Risk  Prescription drug management.   52 yo F with a chief complaint of cough congestion sore throat going on for about 4 days now.  She is well-appearing and nontoxic.  Clear lung sounds.  Chest x-ray independently interpreted by me without focal for pneumothorax.  COVID flu strep negative.  Plain film of the left foot independently interpreted by me without fracture.  Will discharge home.  PCP follow-up.  3:22 PM:  I have discussed the diagnosis/risks/treatment options with the patient.  Evaluation and diagnostic testing in the emergency department does not suggest an emergent condition requiring admission or immediate intervention beyond what has been performed at this time.  They will follow up with PCP. We also discussed returning to the ED immediately if new or worsening sx occur. We discussed the sx which are most concerning (e.g., sudden worsening pain, fever, inability to tolerate by mouth) that necessitate immediate return. Medications administered to the patient during their visit and any new prescriptions provided to the patient are listed below.  Medications given during this visit Medications - No data to display   The patient appears reasonably screen and/or stabilized for discharge and I doubt any other medical condition or other [facility] requiring further screening, evaluation, or treatment in the ED at this time prior to discharge.          Final Clinical Impression(s) / ED Diagnoses Final diagnoses:  Viral upper respiratory tract infection  Sore throat    Rx / DC Orders ED Discharge Orders          Ordered    benzonatate (TESSALON) 100 MG capsule  Every 8 hours        06/27/22 1512    ondansetron (ZOFRAN-ODT) 4 MG  disintegrating tablet        06/27/22 1512              Deno Etienne, DO 06/27/22 1522

## 2022-07-10 ENCOUNTER — Other Ambulatory Visit: Payer: Self-pay | Admitting: Family Medicine

## 2022-07-10 ENCOUNTER — Other Ambulatory Visit: Payer: Self-pay

## 2022-07-10 ENCOUNTER — Other Ambulatory Visit (HOSPITAL_COMMUNITY): Payer: Self-pay | Admitting: Psychiatry

## 2022-07-10 ENCOUNTER — Other Ambulatory Visit (HOSPITAL_COMMUNITY): Payer: Self-pay

## 2022-07-10 DIAGNOSIS — F319 Bipolar disorder, unspecified: Secondary | ICD-10-CM

## 2022-07-10 DIAGNOSIS — E7800 Pure hypercholesterolemia, unspecified: Secondary | ICD-10-CM

## 2022-07-11 ENCOUNTER — Other Ambulatory Visit (HOSPITAL_COMMUNITY): Payer: Self-pay

## 2022-07-11 ENCOUNTER — Other Ambulatory Visit: Payer: Self-pay

## 2022-07-11 MED ORDER — ATORVASTATIN CALCIUM 40 MG PO TABS
40.0000 mg | ORAL_TABLET | Freq: Every day | ORAL | 4 refills | Status: DC
  Filled 2022-07-11: qty 30, 30d supply, fill #0
  Filled 2022-08-09: qty 30, 30d supply, fill #1
  Filled 2022-09-12: qty 30, 30d supply, fill #2
  Filled 2022-10-10 – 2022-10-19 (×2): qty 30, 30d supply, fill #3
  Filled 2022-11-01 – 2022-12-04 (×2): qty 30, 30d supply, fill #4

## 2022-07-11 NOTE — Telephone Encounter (Signed)
Requested Prescriptions  Pending Prescriptions Disp Refills   atorvastatin (LIPITOR) 40 MG tablet 30 tablet 4    Sig: TAKE 1 TABLET (40 MG TOTAL) BY MOUTH DAILY.     Cardiovascular:  Antilipid - Statins Failed - 07/10/2022  9:20 AM      Failed - Lipid Panel in normal range within the last 12 months    Cholesterol, Total  Date Value Ref Range Status  11/30/2021 112 100 - 199 mg/dL Final   LDL Chol Calc (NIH)  Date Value Ref Range Status  11/30/2021 53 0 - 99 mg/dL Final   HDL  Date Value Ref Range Status  11/30/2021 44 >39 mg/dL Final   Triglycerides  Date Value Ref Range Status  11/30/2021 74 0 - 149 mg/dL Final         Passed - Patient is not pregnant      Passed - Valid encounter within last 12 months    Recent Outpatient Visits           1 month ago Hypothyroidism, unspecified type   [place], Enobong, MD   7 months ago Bipolar depression [facility])   [place] Charlott Rakes, MD   11 months ago Urine frequency   [facility], Kriste Basque, NP   1 year ago Hypothyroidism, unspecified type   [place], Enobong, MD   1 year ago Acute upper respiratory infection   [place], PA-C       Future Appointments             In 3 months Charlott Rakes, MD [place]

## 2022-07-12 ENCOUNTER — Other Ambulatory Visit (HOSPITAL_COMMUNITY): Payer: Self-pay

## 2022-07-13 ENCOUNTER — Other Ambulatory Visit: Payer: Self-pay

## 2022-07-13 ENCOUNTER — Other Ambulatory Visit (HOSPITAL_COMMUNITY): Payer: Self-pay

## 2022-07-13 ENCOUNTER — Ambulatory Visit: Payer: Self-pay | Admitting: *Deleted

## 2022-07-13 NOTE — Telephone Encounter (Signed)
  Chief Complaint: top of left foot swollen and painful Symptoms: dropped a piece of wood on foot and seen 06/27/22 ED. Now foot is still swollen and bruised swelling to ankle. Difficulty sleeping .  Frequency: 06/05/22 Pertinent Negatives: Patient denies fever, no swelling to left leg. Can walk on foot  Disposition: '[]'$ ED /'[x]'$ Urgent Care (no appt availability in office) / '[]'$ Appointment(In office/virtual)/ '[]'$  [facility]/ '[]'$ Home Care/ '[]'$ Refused Recommended Disposition /'[]'$  Mobile Bus/ '[]'$  Follow-up with PCP Additional Notes:   Recommended UC due to no appt in 3 days patient could schedule due to work. Patient has to be at work by 2 pm. Please advise if appt can be scheduled before 2 pm.    Reason for Disposition  [1] After 2 weeks AND [2] still painful or swollen  Answer Assessment - Initial Assessment Questions 1. MECHANISM: "How did the injury happen?" (e.g., twisting injury, direct blow)      Dropped a piece of wood on it and was seen 06/27/22 2. ONSET: "When did the injury happen?" (Minutes or hours ago)      06/27/22 3. LOCATION: "Where is the injury located?"      Top of left foot  4. APPEARANCE of INJURY: "What does the injury look like?"      Bruised, warm to touch swelling up to ankle 5. WEIGHT-BEARING: "Can you put weight on that foot?" "Can you walk (four steps or more)?"       Yes  but pain with walking  6. SIZE: For cuts, bruises, or swelling, ask: "How large is it?" (e.g., inches or centimeters;  entire joint)      Swelling left foot can not get shoe on  7. PAIN: "Is there pain?" If Yes, ask: "How bad is the pain?"    (e.g., Scale 1-10; or mild, moderate, severe)   - NONE (0): no pain.   - MILD (1-3): doesn't interfere with normal activities.    - MODERATE (4-7): interferes with normal activities (e.g., work or school) or awakens from sleep, limping.    - SEVERE (8-10): excruciating pain, unable to do any normal activities, unable to walk.      Interferes  with sleeping  8. TETANUS: For any breaks in the skin, ask: "When was the last tetanus booster?"     na 9. OTHER SYMPTOMS: "Do you have any other symptoms?"      "Knot " on top of left foot  10. PREGNANCY: "Is there any chance you are pregnant?" "When was your last menstrual period?"       na  Protocols used: Ankle and Foot Injury-A-AH

## 2022-07-16 ENCOUNTER — Other Ambulatory Visit (HOSPITAL_COMMUNITY): Payer: Self-pay

## 2022-07-17 ENCOUNTER — Other Ambulatory Visit (HOSPITAL_COMMUNITY): Payer: Self-pay

## 2022-07-20 ENCOUNTER — Telehealth (HOSPITAL_COMMUNITY): Payer: Self-pay

## 2022-07-20 ENCOUNTER — Other Ambulatory Visit (HOSPITAL_COMMUNITY): Payer: Self-pay | Admitting: Physician Assistant

## 2022-07-20 ENCOUNTER — Telehealth (HOSPITAL_COMMUNITY): Payer: Self-pay | Admitting: Student in an Organized Health Care Education/Training Program

## 2022-07-20 DIAGNOSIS — F319 Bipolar disorder, unspecified: Secondary | ICD-10-CM

## 2022-07-20 MED ORDER — BUPROPION HCL ER (XL) 300 MG PO TB24
300.0000 mg | ORAL_TABLET | Freq: Every day | ORAL | 0 refills | Status: DC
  Filled 2022-07-27: qty 30, 30d supply, fill #0

## 2022-07-20 NOTE — Progress Notes (Signed)
Provider was contacted by Beather Arbour, RN regarding patient's request for medication refill.  Patient's medication to be e-prescribed to pharmacy of choice.

## 2022-07-20 NOTE — Telephone Encounter (Signed)
Medication refill - Patient called stating she needed a new order for her prescribe Bupropion (Wellbutrin XL) 300 mg, last provided 04/16/22. Patient stated she missed her appt 06/21/22 due to connectivity problems trying to do the visit virtually and requests a call back to get rescheduled as well.  Patient stated she has been out of her Wellbutrin XL for a little over a week, that she still has her prescribed Lithium and Hydroxyzine but needs to Wellbutrin XL stating she can tell some difference in her mood without it.  Agreed to send a request for this to be filled and to have her called back to reschedule her next appointment and patient in agreement with plan.

## 2022-07-20 NOTE — Telephone Encounter (Signed)
Message acknowledged and reviewed. Will make sure that patient gets a follow up appointment. Felicia or Anjenette, can you schedule a follow up appointment for this patient? Patient's medication to be e-prescribed to pharmacy of choice.

## 2022-07-23 ENCOUNTER — Other Ambulatory Visit (HOSPITAL_COMMUNITY): Payer: Self-pay

## 2022-07-23 ENCOUNTER — Other Ambulatory Visit (HOSPITAL_COMMUNITY): Payer: Self-pay | Admitting: Psychiatry

## 2022-07-23 DIAGNOSIS — F319 Bipolar disorder, unspecified: Secondary | ICD-10-CM

## 2022-07-24 ENCOUNTER — Other Ambulatory Visit (HOSPITAL_COMMUNITY): Payer: Self-pay

## 2022-07-27 ENCOUNTER — Telehealth (HOSPITAL_COMMUNITY): Payer: Self-pay

## 2022-07-27 ENCOUNTER — Other Ambulatory Visit (HOSPITAL_COMMUNITY): Payer: Self-pay

## 2022-07-27 NOTE — Telephone Encounter (Signed)
Medication management - Telephone call with patient 2 times this date and with pharmacist at [facility] as patient was stating her pharmacy did not have her new Wellbutrin order. Verified with pt that Trinna Post, PA-C had e-scribed in a new Bupropion (Wellbutrin XL) 300 mg order on 07/20/22 to [place] and they confirmed this order was there.  Patient stated because of a change in insurance she now had to get medications at [facility] so patient will call [employer] [facility] in [place] and have the order transferred to [facility]  Patient will call back if any issues having the order transferred.

## 2022-07-30 ENCOUNTER — Other Ambulatory Visit (HOSPITAL_COMMUNITY): Payer: Self-pay

## 2022-07-31 ENCOUNTER — Other Ambulatory Visit: Payer: Self-pay

## 2022-08-01 ENCOUNTER — Telehealth: Payer: Self-pay | Admitting: Gastroenterology

## 2022-08-01 NOTE — Telephone Encounter (Signed)
Left message on machine to call back  

## 2022-08-01 NOTE — Telephone Encounter (Signed)
Inbound call from pt , she is schedule for a OV 4/3 but she is having some rectal bleeding/ diarrhea.Please advise

## 2022-08-02 NOTE — Telephone Encounter (Signed)
The pt will keep the appt as planned

## 2022-08-06 ENCOUNTER — Telehealth: Payer: Self-pay | Admitting: Emergency Medicine

## 2022-08-06 DIAGNOSIS — D229 Melanocytic nevi, unspecified: Secondary | ICD-10-CM

## 2022-08-06 NOTE — Telephone Encounter (Signed)
Copied from [place] [id number]. Topic: Referral - Status >> Aug 06, 2022  4:28 PM Cyndi Bender wrote: Reason for CRM: Pt requests another referral be sent to Dr. Ronnald Ramp at [facility]

## 2022-08-08 ENCOUNTER — Telehealth: Payer: Self-pay | Admitting: Emergency Medicine

## 2022-08-08 NOTE — Telephone Encounter (Signed)
Done

## 2022-08-08 NOTE — Telephone Encounter (Signed)
Copied from [place] [phone]. Topic: Referral - Request for Referral >> Aug 08, 2022 11:07 AM Everette C wrote: Has patient seen PCP for this complaint? Yes.   *If NO, is insurance requiring patient see PCP for this issue before PCP can refer them? Referral for which specialty: Gastroenterology  Preferred provider/office: [facility]  Reason for referral: GI concerns

## 2022-08-08 NOTE — Addendum Note (Signed)
Addended by: Charlott Rakes on: 08/08/2022 12:29 PM   Modules accepted: Orders

## 2022-08-09 ENCOUNTER — Other Ambulatory Visit (HOSPITAL_COMMUNITY): Payer: Self-pay

## 2022-08-09 ENCOUNTER — Ambulatory Visit (HOSPITAL_COMMUNITY): Payer: Commercial Managed Care - HMO | Admitting: Physician Assistant

## 2022-08-09 ENCOUNTER — Encounter (HOSPITAL_COMMUNITY): Payer: Self-pay | Admitting: Physician Assistant

## 2022-08-09 DIAGNOSIS — F411 Generalized anxiety disorder: Secondary | ICD-10-CM | POA: Diagnosis not present

## 2022-08-09 DIAGNOSIS — F319 Bipolar disorder, unspecified: Secondary | ICD-10-CM

## 2022-08-09 MED ORDER — HYDROXYZINE HCL 10 MG PO TABS: ORAL_TABLET | ORAL | 3 refills | Status: DC

## 2022-08-09 MED ORDER — BUPROPION HCL ER (XL) 300 MG PO TB24: 300.0000 mg | ORAL_TABLET | Freq: Every day | ORAL | 3 refills | Status: DC

## 2022-08-09 MED ORDER — LITHIUM CARBONATE 300 MG PO CAPS: ORAL_CAPSULE | ORAL | 3 refills | Status: DC
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
## 2022-08-10 ENCOUNTER — Other Ambulatory Visit (HOSPITAL_COMMUNITY): Payer: Self-pay

## 2022-08-10 ENCOUNTER — Other Ambulatory Visit: Payer: Self-pay

## 2022-08-14 ENCOUNTER — Other Ambulatory Visit: Payer: Self-pay

## 2022-09-04 NOTE — Progress Notes (Signed)
09/05/2022 Kristen Moore [id number] 03-25-1971  Referring provider: Hoy Register, MD Primary GI doctor: Kristen Moore  ASSESSMENT AND PLAN:   Family history of colon cancer and rectal bleeding, loose stools Did not complete prep last colonoscopy, 3 mm polyps removed Repeat colon, appropriate LEC with Kristen Moore We have discussed the risks of bleeding, infection, perforation, medication reactions, and remote risk of death associated with colonoscopy. All questions were answered and the patient acknowledges these risk and wishes to proceed. Long discussion about prep Consider SIBO treatment if negative  Generalized postprandial abdominal pain with GERD, history of s/p cholecystectomy  Lifestyle changes discussed, avoid NSAIDS, ETOH Will do trial of PPI once daily and Will schedule for EGD.  Will schedule EGD to evaluate for possible H. pylori, esophagitis, gastritis, peptic ulcer disease, celiac etc.. I discussed risks of EGD with patient today, including risk of sedation, bleeding or perforation.  Patient provides understanding and gave verbal consent to proceed.   Patient Care Team: Kristen Register, MD as PCP - General (Family Medicine)  HISTORY OF PRESENT ILLNESS: 52 y.o. female with a past medical history of bipolar 1 disorder, dysfunctional uterine bleeding with history of anemia, hypothyroidism, IBS, s/p cholecystectomy and others listed below presents for evaluation of rectal bleeding and diarrhea.   03/07/2022 colonoscopy with Kristen Moore for screening purposes and adequate bowel prep stool in the entire examined colon.  3 mm polyp distal sigmoid she was to repeat colonoscopy at next available appointment.   05/07/2022 negative genetic testing 05/30/2022 most recent labs reviewed show Hgb 14.5, WBC 5.2, platelets 187, normal kidney and liver.  She states she did the prep at night but not in the morning.  She has had yellow/red diarrhea that looks like blood and  very rare can happen daily.  Can have back to rectal pain and then will have liquid leakage with blood, has happened several times.  She has lost weight after her mom died, thyroid was hyperthyroid and this was fixed, weight is up.  She had 2 sandwiches last night eating [place] and has severe epigastric pain. [place] food is worse.  She has GERD occasionally, some nausea, no vomiting.  She has had trouble swallowing.   Denies NSAIDS, denies ETOH. She does smoke cig, no drug use.  She ran into a tree branch last night in her left eye, has tearing and pain, still has pain and erythema. She tried to flush her eye. She continues to have pain.  She has been losing her hair.   Maternal uncle with colon cancer. Mother with ovarian cancer. Maternal grandmother with breast cancer. No known family history of colon cancer or polyps. No family history of uterine/endometrial cancer, pancreatic cancer or gastric/stomach cancer.  Wt Readings from Last 6 Encounters:  09/05/22 199 lb 4 oz (90.4 kg)  06/27/22 196 lb (88.9 kg)  05/30/22 196 lb 3.2 oz (89 kg)  05/04/22 198 lb (89.8 kg)  03/07/22 194 lb (88 kg)  02/07/22 194 lb (88 kg)      She  reports that she has been smoking cigarettes. She has a 9.00 pack-year smoking history. She has never used smokeless tobacco. She reports that she does not drink alcohol and does not use drugs.  RELEVANT LABS AND IMAGING: CBC    Component Value Date/Time   WBC 5.2 05/30/2022 1544   WBC 8.3 03/18/2020 0952   RBC 4.45 05/30/2022 1544   RBC 4.40 03/18/2020 0952   HGB 14.5 05/30/2022 1544  HCT 42.1 05/30/2022 1544   PLT 187 05/30/2022 1544   MCV 95 05/30/2022 1544   MCH 32.6 05/30/2022 1544   MCH 32.5 03/18/2020 0952   MCHC 34.4 05/30/2022 1544   MCHC 33.3 03/18/2020 0952   RDW 11.8 05/30/2022 1544   LYMPHSABS 1.3 05/30/2022 1544   MONOABS 0.5 03/18/2020 0952   EOSABS 0.1 05/30/2022 1544   BASOSABS 0.0 05/30/2022 1544   Recent Labs    05/30/22 1544   HGB 14.5     CMP     Component Value Date/Time   NA 142 05/30/2022 1544   K 4.5 05/30/2022 1544   CL 106 05/30/2022 1544   CO2 23 05/30/2022 1544   GLUCOSE 92 05/30/2022 1544   GLUCOSE 102 (H) 03/18/2020 0952   BUN 10 05/30/2022 1544   CREATININE 0.85 05/30/2022 1544   CREATININE 0.80 03/15/2015 1742   CALCIUM 10.1 05/30/2022 1544   PROT 6.5 05/30/2022 1544   ALBUMIN 4.5 05/30/2022 1544   AST 15 05/30/2022 1544   ALT 17 05/30/2022 1544   ALKPHOS 80 05/30/2022 1544   BILITOT 0.3 05/30/2022 1544   GFRNONAA >60 03/18/2020 0952   GFRNONAA >89 09/08/2014 1712   GFRAA 121 06/30/2019 1528   GFRAA >89 09/08/2014 1712      Latest Ref Rng & Units 05/30/2022    3:44 PM 11/30/2021   11:16 AM 01/26/2021    9:49 AM  Hepatic Function  Total Protein 6.0 - 8.5 g/dL 6.5  6.5  6.5   Albumin 3.9 - 4.9 g/dL 4.5  4.5  4.5   AST 0 - 40 IU/L ALT 0 - 32 IU/L Alk Phosphatase 44 - 121 IU/L 80  74  77   Total Bilirubin 0.0 - 1.2 mg/dL 0.3  0.6  0.4       Current Medications:   Current Outpatient Medications (Endocrine & Metabolic):    levothyroxine (SYNTHROID) 137 MCG tablet, Take 1 tablet (137 mcg total) by mouth daily.  Current Outpatient Medications (Cardiovascular):    atorvastatin (LIPITOR) 40 MG tablet, Take 1 tablet (40 mg total) by mouth daily.  Current Outpatient Medications (Respiratory):    cetirizine (ZYRTEC) 10 MG tablet, Take 1 tablet (10 mg total) by mouth daily. (Patient taking differently: Take 10 mg by mouth as needed.)    Current Outpatient Medications (Other):    buPROPion (WELLBUTRIN XL) 300 MG 24 hr tablet, Take 1 tablet (300 mg total) by mouth daily.   hydrOXYzine (ATARAX) 10 MG tablet, Take 1/2 to 1 tablet (5 mg to 10 mg) up to three times a day as needed for anxiety or sleep.   lithium carbonate 300 MG capsule, TAKE 1 CAPSULE BY MOUTH EVERY MORNING & TAKE 2 CAPSULES EVERY NIGHT AT BEDTIME   Multiple Vitamins-Minerals (ONE-A-DAY WOMENS  PO), Take by mouth.   Na Sulfate-K Sulfate-Mg Sulf 17.5-3.13-1.6 GM/177ML SOLN, Take 1 kit by mouth once for 1 dose.   pantoprazole (PROTONIX) 40 MG tablet, Take 1 tablet (40 mg total) by mouth daily.   lithium carbonate 300 MG capsule, Take 1 capsule (300 mg total) by mouth in the morning AND 2 capsules (600 mg total) at bedtime.  Medical History:  Past Medical History:  Diagnosis Date   Abnormal Pap smear    cryo   Anemia    Asthma    as a child   Bipolar 1 disorder    Cyst of breast  left breast   DUB (dysfunctional uterine bleeding)    Fibroids    Headache(784.0)    Hypothyroidism    IBS (irritable bowel syndrome)    Thyroid dysfunction    Allergies:  Allergies  Allergen Reactions   Bee Venom Swelling and Anaphylaxis   Latex Swelling   Penicillins Swelling    Has patient had a PCN reaction causing immediate rash, facial/tongue/throat swelling, SOB or lightheadedness with hypotension: yes Has patient had a PCN reaction causing severe rash involving mucus membranes or skin necrosis: no Has patient had a PCN reaction that required hospitalization no Has patient had a PCN reaction occurring within the last 10 years: no If all of the above answers are "NO", then may proceed with Cephalosporin   Ibuprofen Swelling   Omega-3 Hives and Swelling    Potatoes     Other Hives and Swelling    Potatoes        Surgical History:  She  has a past surgical history that includes Cholecystectomy; Tubal ligation; Dilation and curettage of uterus; Abdominal hysterectomy (Bilateral, 12/25/2012); Bilateral salpingectomy (Bilateral, 12/25/2012); Oophorectomy (Bilateral, 12/25/2012); Cervical biopsy w/ loop electrode excision; Colonoscopy; and Upper gastrointestinal endoscopy. Family History:  Her family history includes Breast cancer in her maternal grandmother and paternal aunt; COPD in her mother; Colon cancer in an other family member; Fibromyalgia in her mother; Heart disease in her  father; Hypertension in her mother; Lung cancer in her father; Lung cancer (age of onset: 57) in her paternal uncle; Ovarian cancer (age of onset: 77) in her mother; Rheum arthritis in her mother; Skin cancer in her maternal uncle; Sleep apnea in her mother; Thyroid disease in her mother.  REVIEW OF SYSTEMS  : All other systems reviewed and negative except where noted in the History of Present Illness.  PHYSICAL EXAM: BP 118/78   Pulse 62   Ht 5\' 4"  (1.626 m)   Wt 199 lb 4 oz (90.4 kg)   LMP 11/29/2012   SpO2 98%   BMI 34.20 kg/m  General Appearance: Well nourished, in no apparent distress. Head:   Normocephalic and atraumatic. Eyes:  sclerae anicteric,conjunctive pink  Respiratory: Respiratory effort normal, BS equal bilaterally without rales, rhonchi, wheezing. Cardio: RRR with no MRGs. Peripheral pulses intact.  Abdomen: Soft,  Obese ,active bowel sounds. mild tenderness in the epigastrium. Without guarding and Without rebound. No masses. Rectal: Not evaluated Musculoskeletal: Full ROM, Normal gait. Without edema. Skin:  Dry and intact without significant lesions or rashes Neuro: Alert and  oriented x4;  No focal deficits. Psych:  Cooperative. Normal mood and affect.    Doree Albee, PA-C 3:33 PM

## 2022-09-05 ENCOUNTER — Ambulatory Visit
Admission: EM | Admit: 2022-09-05 | Discharge: 2022-09-05 | Disposition: A | Discharge status: Home / Self Care | Payer: Commercial Managed Care - HMO | Attending: Emergency Medicine | Admitting: Emergency Medicine

## 2022-09-05 ENCOUNTER — Ambulatory Visit (INDEPENDENT_AMBULATORY_CARE_PROVIDER_SITE_OTHER): Payer: Commercial Managed Care - HMO | Admitting: Physician Assistant

## 2022-09-05 ENCOUNTER — Other Ambulatory Visit (HOSPITAL_COMMUNITY): Payer: Self-pay

## 2022-09-05 ENCOUNTER — Encounter: Payer: Self-pay | Admitting: Physician Assistant

## 2022-09-05 VITALS — BP 118/78 | HR 62 | Ht 64.0 in | Wt 199.2 lb

## 2022-09-05 DIAGNOSIS — K625 Hemorrhage of anus and rectum: Secondary | ICD-10-CM

## 2022-09-05 DIAGNOSIS — R1084 Generalized abdominal pain: Secondary | ICD-10-CM | POA: Diagnosis not present

## 2022-09-05 DIAGNOSIS — Z809 Family history of malignant neoplasm, unspecified: Secondary | ICD-10-CM

## 2022-09-05 DIAGNOSIS — S0502XA Injury of conjunctiva and corneal abrasion without foreign body, left eye, initial encounter: Secondary | ICD-10-CM | POA: Diagnosis not present

## 2022-09-05 DIAGNOSIS — K219 Gastro-esophageal reflux disease without esophagitis: Secondary | ICD-10-CM | POA: Diagnosis not present

## 2022-09-05 MED ORDER — ERYTHROMYCIN 5 MG/GM OP OINT: TOPICAL_OINTMENT | OPHTHALMIC | 0 refills | Status: DC

## 2022-09-05 MED ORDER — NA SULFATE-K SULFATE-MG SULF 17.5-3.13-1.6 GM/177ML PO SOLN
1.0000 | Freq: Once | ORAL | 0 refills | Status: AC
  Filled 2022-09-05: qty 354, 2d supply, fill #0

## 2022-09-05 MED ORDER — PANTOPRAZOLE SODIUM 40 MG PO TBEC
40.0000 mg | DELAYED_RELEASE_TABLET | Freq: Every day | ORAL | 3 refills | Status: DC
  Filled 2022-09-05 (×2): qty 30, 30d supply, fill #0
  Filled 2022-10-10: qty 30, 30d supply, fill #1

## 2022-09-05 NOTE — ED Triage Notes (Signed)
Pt presents with left eye injury after a tree limb smacked her in the eye last night; pt states she has pain, irritation and constant drainage.

## 2022-09-05 NOTE — Patient Instructions (Addendum)
You have been scheduled for an endoscopy and colonoscopy. Please follow the written instructions given to you at your visit today. Please pick up your prep supplies at the pharmacy within the next 1-3 days. If you use inhalers (even only as needed), please bring them with you on the day of your procedure.  Please take your proton pump inhibitor medication, protonix 40 mg  Please take this medication 30 minutes to 1 hour before meals- this makes it more effective.  Avoid spicy and acidic foods Avoid fatty foods Limit your intake of coffee, tea, alcohol, and carbonated drinks Work to maintain a healthy weight Keep the head of the bed elevated at least 3 inches with blocks or a wedge pillow if you are having any nighttime symptoms Stay upright for 2 hours after eating Avoid meals and snacks three to four hours before bedtime Stop smoking  First do a trial off milk/lactose products if you use them.  Add fiber like benefiber or citracel once a day Can do trial of IBGard which is over the counter for AB pain- Take 1-2 capsules once a day for maintence or twice a day during a flare

## 2022-09-05 NOTE — ED Provider Notes (Signed)
[facility]    CSN: [id number] Arrival date & time: 09/05/22  1746      History   Chief Complaint Chief Complaint  Patient presents with   Eye Injury    HPI Kristen Moore is a 52 y.o. female.   For evaluation of left eye pain, slightly blurred vision and increased tearing beginning 1 day ago after getting hit in the eye with a tree branch, endorses that she walked into it.  Had 1 occurrence of a right-sided headache which has resolved.  Has attempted use of Tylenol which has been ineffective.  Denies light sensitivity, floaters, eye drainage, eye pruritus.  Denies use of contacts or glasses.      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   Generalized anxiety disorder 02/02/2020   Screening breast examination 05/19/2019   Bipolar 1 disorder 05/07/2019   Elevated blood pressure reading in office without diagnosis of hypertension 08/20/2018   Hyperthyroidism 08/20/2018   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   Bilateral lower extremity edema 08/12/2017   Transition of care performed with sharing of clinical summary 08/12/2017   Decreased range of motion of right shoulder 06/13/2017   Muscle weakness of right arm 06/13/2017   Neck pain 06/13/2017   Poor posture 06/13/2017   Pain in right elbow 05/07/2017   Asymptomatic menopausal state 05/06/2017   Localized edema 05/06/2017   Major depressive disorder, single episode 05/06/2017   Pure hypercholesterolemia 05/06/2017   Sprain of left foot 12/20/2016   Rotator cuff impingement syndrome 09/03/2016    Decreased vision in both eyes 06/05/2016   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   IBS (irritable bowel syndrome) 10/21/2015   Dyshidrotic hand dermatitis 10/21/2015   Dyshidrotic foot dermatitis 10/21/2015   S/P bilateral oophorectomy 10/21/2015   Hypothyroidism 10/21/2015   HLD (hyperlipidemia) 03/18/2015   Knee pain, right 12/13/2014   Pelvic pain 12/25/2012   CHEST PAIN UNSPECIFIED 11/05/2008   ELECTROCARDIOGRAM, ABNORMAL 11/05/2008    Past Surgical History:  Procedure Laterality Date   ABDOMINAL HYSTERECTOMY Bilateral 12/25/2012   Procedure: HYSTERECTOMY ABDOMINAL;  Surgeon: Emily Filbert, MD;  Location: [facility];  Service: Gynecology;  Laterality: Bilateral;  TAH and bilateral salpingectomy   BILATERAL SALPINGECTOMY Bilateral 12/25/2012   Procedure: BILATERAL SALPINGECTOMY;  Surgeon: Emily Filbert, MD;  Location: [facility];  Service: Gynecology;  Laterality: Bilateral;   CERVICAL BIOPSY  W/ LOOP ELECTRODE EXCISION     CHOLECYSTECTOMY     COLONOSCOPY     10 + years ago   DILATION AND CURETTAGE OF UTERUS     OOPHORECTOMY Bilateral 12/25/2012   Procedure: OOPHORECTOMY;  Surgeon: Emily Filbert, MD;  Location: [facility];  Service: Gynecology;  Laterality: Bilateral;   TUBAL LIGATION     UPPER GASTROINTESTINAL ENDOSCOPY     10 + years    OB History     Gravida  3   Para  2   Term  1  Preterm  1   AB  1   Living  1      SAB      IAB  1   Ectopic      Multiple      Live Births               Home Medications    Prior to Admission medications   Medication Sig Start Date End Date Taking? Authorizing Provider  atorvastatin (LIPITOR) 40 MG tablet Take 1 tablet (40 mg total) by mouth daily. 07/11/22   Charlott Rakes, MD  buPROPion (WELLBUTRIN XL) 300 MG 24 hr tablet Take 1 tablet (300 mg total) by mouth daily. 08/09/22   Nwoko, Terese Door, PA  cetirizine (ZYRTEC) 10 MG tablet Take 1 tablet (10 mg total) by mouth daily. Patient taking differently: Take 10 mg by mouth  as needed. 05/30/22   Charlott Rakes, MD  hydrOXYzine (ATARAX) 10 MG tablet Take 1/2 to 1 tablet (5 mg to 10 mg) up to three times a day as needed for anxiety or sleep. 08/09/22   Nwoko, Terese Door, PA  levothyroxine (SYNTHROID) 137 MCG tablet Take 1 tablet (137 mcg total) by mouth daily. 06/08/22   Charlott Rakes, MD  lithium carbonate 300 MG capsule Take 1 capsule (300 mg total) by mouth in the morning AND 2 capsules (600 mg total) at bedtime. 04/16/22 07/15/22  Bahraini, Sarah A  lithium carbonate 300 MG capsule TAKE 1 CAPSULE BY MOUTH EVERY MORNING & TAKE 2 CAPSULES EVERY NIGHT AT BEDTIME 08/09/22   Nwoko, Terese Door, PA  Multiple Vitamins-Minerals (ONE-A-DAY WOMENS PO) Take by mouth.    [provider]  Na Sulfate-K Sulfate-Mg Sulf 17.5-3.13-1.6 GM/177ML SOLN Take 1 kit by mouth once for 1 dose. 09/05/22 09/06/22  Vladimir Crofts, PA-C  pantoprazole (PROTONIX) 40 MG tablet Take 1 tablet (40 mg total) by mouth daily. 09/05/22   Vladimir Crofts, PA-C    Family History Family History  Problem Relation Age of Onset   Hypertension Mother    Rheum arthritis Mother    Fibromyalgia Mother    COPD Mother    Sleep apnea Mother    Thyroid disease Mother    Ovarian cancer Mother 97   Heart disease Father    Lung cancer Father        dx 74s   Skin cancer Maternal Uncle        melanoma on back   Breast cancer Paternal Aunt        dx 63s   Lung cancer Paternal Uncle 88       mets to brain   Breast cancer Maternal Grandmother        dx 3s   Colon cancer Other        MGF's brother; dx 19s   Esophageal cancer Neg Hx    Stomach cancer Neg Hx     Social History Social History   Tobacco Use   Smoking status: Every Day    Packs/day: 1.00    Years: 9.00    Additional pack years: 0.00    Total pack years: 9.00    Types: Cigarettes   Smokeless tobacco: Never  Vaping Use   Vaping Use: Never used  Substance Use Topics   Alcohol use: No    Alcohol/week: 0.0 standard drinks of alcohol    Drug use: No     Allergies   Bee venom, Latex, Penicillins, Ibuprofen, Omega-3, and Other   Review of Systems Review  of Systems   Physical Exam Triage Vital Signs ED Triage Vitals  Enc Vitals Group     BP 09/05/22 1758 128/88     Pulse Rate 09/05/22 1758 65     Resp 09/05/22 1758 18     Temp 09/05/22 1758 97.9 F (36.6 C)     Temp Source 09/05/22 1758 Oral     SpO2 09/05/22 1758 95 %     Weight --      Height --      Head Circumference --      Peak Flow --      Pain Score 09/05/22 1757 9     Pain Loc --      Pain Edu? --      Excl. in [place]? --    No data found.  Updated Vital Signs BP 128/88 (BP Location: Left Arm)   Pulse 65   Temp 97.9 F (36.6 C) (Oral)   Resp 18   LMP 11/29/2012   SpO2 95%   Visual Acuity Right Eye Distance:   Left Eye Distance:   Bilateral Distance:    Right Eye Near:   Left Eye Near:    Bilateral Near:     Physical Exam Constitutional:      Appearance: Normal appearance.  Eyes:     Extraocular Movements: Extraocular movements intact.      Comments: Corneal abrasion noted to the left eye at approximately 5:00, extraocular movements intact, vision is grossly intact  Pulmonary:     Effort: Pulmonary effort is normal.  Neurological:     Mental Status: She is alert and oriented to person, place, and time.      UC Treatments / Results  Labs (all labs ordered are listed, but only abnormal results are displayed) Labs Reviewed - No data to display  EKG   Radiology No results found.  Procedures Procedures (including critical care time)  Medications Ordered in UC Medications - No data to display  Initial Impression / Assessment and Plan / UC Course  I have reviewed the triage vital signs and the nursing notes.  Pertinent labs & imaging results that were available during my care of the patient were reviewed by me and considered in my medical decision making (see chart for details).  Abrasion of left cornea, initial  encounter  Able to confirm on fluorescein staining, discussed with patient, erythromycin ointment prescribed, recommended cool to warm compresses and over-the-counter analgesics for pain management, given referral to ophthalmology if no improvement seen within 72 hours Final Clinical Impressions(s) / UC Diagnoses   Final diagnoses:  None   Discharge Instructions   None    ED Prescriptions   None    PDMP not reviewed this encounter.   Hans Eden, NP 09/05/22 1843

## 2022-09-05 NOTE — Discharge Instructions (Signed)
Able to visualize abrasion to the left eye on exam, typically these will improve with time however if no improvement seen within 72 hours please schedule follow-up appointment with the eye doctor, information is on front page  Erythromycin ointment every morning and every evening for 7 days to keep area from becoming infected  May use cool to warm compresses over the eye for general comfort  May take ibuprofen 800 mg and/or Tylenol 500 to 1000 mg every 6 hours for pain management

## 2022-09-06 ENCOUNTER — Telehealth: Payer: Self-pay | Admitting: Family Medicine

## 2022-09-06 DIAGNOSIS — S0590XA Unspecified injury of unspecified eye and orbit, initial encounter: Secondary | ICD-10-CM

## 2022-09-06 NOTE — Telephone Encounter (Signed)
Routing to PCP for review.

## 2022-09-06 NOTE — Telephone Encounter (Signed)
Copied from [place] [id number]. Topic: Referral - Request for Referral >> Sep 06, 2022 11:01 AM Denman George T wrote: Has patient seen PCP for this complaint? No. *If NO, is insurance requiring patient see PCP for this issue before PCP can refer them? Referral for which specialty: Eye Doctor Preferred provider/office: [facility]; [place] [address] [phone] Reason for referral: cut on her retinal from a tree branch

## 2022-09-07 NOTE — Telephone Encounter (Signed)
Referral has been placed. 

## 2022-09-07 NOTE — Addendum Note (Signed)
Addended by: Hoy Register on: 09/07/2022 08:54 AM   Modules accepted: Orders

## 2022-09-07 NOTE — Telephone Encounter (Signed)
Attempted to contact patient and message states that call can not be placed at this time.

## 2022-09-07 NOTE — Telephone Encounter (Signed)
Duplicate message  Referral placed.  Copied from CRM [id number]. Topic: Referral - Status >> Sep 07, 2022 11:05 AM Kristen Moore wrote: Reason for CRM: Pt requested Moore referral to [facility]. Pt states that she spoke with them this morning and they are requesting for the referral to be faxed to them along with pt medical records.  Fax number: [phone]

## 2022-09-11 ENCOUNTER — Encounter: Payer: Commercial Managed Care - HMO | Admitting: Gastroenterology

## 2022-09-12 ENCOUNTER — Other Ambulatory Visit (HOSPITAL_COMMUNITY): Payer: Self-pay

## 2022-09-12 ENCOUNTER — Other Ambulatory Visit: Payer: Self-pay

## 2022-09-12 NOTE — Progress Notes (Signed)
Reviewed and agree with management plans. Colonoscopy with 2-day bowel prep recommended.   Lynnel Zanetti L. Orvan Falconer, MD, MPH

## 2022-09-20 ENCOUNTER — Encounter (HOSPITAL_COMMUNITY): Payer: Commercial Managed Care - HMO | Admitting: Psychiatry

## 2022-09-20 ENCOUNTER — Ambulatory Visit (AMBULATORY_SURGERY_CENTER): Payer: Commercial Managed Care - HMO | Admitting: Gastroenterology

## 2022-09-20 ENCOUNTER — Telehealth: Payer: Self-pay | Admitting: Gastroenterology

## 2022-09-20 ENCOUNTER — Encounter: Payer: Self-pay | Admitting: Gastroenterology

## 2022-09-20 VITALS — BP 110/80 | HR 62 | Temp 98.6°F | Resp 16 | Ht 64.0 in | Wt 199.0 lb

## 2022-09-20 DIAGNOSIS — K529 Noninfective gastroenteritis and colitis, unspecified: Secondary | ICD-10-CM

## 2022-09-20 DIAGNOSIS — R1013 Epigastric pain: Secondary | ICD-10-CM | POA: Diagnosis not present

## 2022-09-20 DIAGNOSIS — K625 Hemorrhage of anus and rectum: Secondary | ICD-10-CM

## 2022-09-20 DIAGNOSIS — R197 Diarrhea, unspecified: Secondary | ICD-10-CM | POA: Diagnosis not present

## 2022-09-20 DIAGNOSIS — D120 Benign neoplasm of cecum: Secondary | ICD-10-CM

## 2022-09-20 MED ORDER — SODIUM CHLORIDE 0.9 % IV SOLN: 500.0000 mL | Freq: Once | INTRAVENOUS | Status: DC

## 2022-09-20 NOTE — Progress Notes (Signed)
Report given to PACU, vss 

## 2022-09-20 NOTE — Telephone Encounter (Signed)
Attempted to reach, busy signal again

## 2022-09-20 NOTE — Progress Notes (Signed)
No changes to clinical history since GI office visit on 09/05/22.  The patient is appropriate for an endoscopic procedure in the ambulatory setting.  - Amada Jupiter, MD

## 2022-09-20 NOTE — Patient Instructions (Signed)
Please read handouts provided. Continue present medications. Await pathology results. Return to physician assistant at appointment to be scheduled.  YOU HAD AN ENDOSCOPIC PROCEDURE TODAY AT THE [facility]:   Refer to the procedure report that was given to you for any specific questions about what was found during the examination.  If the procedure report does not answer your questions, please call your gastroenterologist to clarify.  If you requested that your care partner not be given the details of your procedure findings, then the procedure report has been included in a sealed envelope for you to review at your convenience later.  YOU SHOULD EXPECT: Some feelings of bloating in the abdomen. Passage of more gas than usual.  Walking can help get rid of the air that was put into your GI tract during the procedure and reduce the bloating. If you had a lower endoscopy (such as a colonoscopy or flexible sigmoidoscopy) you may notice spotting of blood in your stool or on the toilet paper. If you underwent a bowel prep for your procedure, you may not have a normal bowel movement for a few days.  Please Note:  You might notice some irritation and congestion in your nose or some drainage.  This is from the oxygen used during your procedure.  There is no need for concern and it should clear up in a day or so.  SYMPTOMS TO REPORT IMMEDIATELY:  Following lower endoscopy (colonoscopy or flexible sigmoidoscopy):  Excessive amounts of blood in the stool  Significant tenderness or worsening of abdominal pains  Swelling of the abdomen that is new, acute  Fever of 100F or higher  Following upper endoscopy (EGD)  Vomiting of blood or coffee ground material  New chest pain or pain under the shoulder blades  Painful or persistently difficult swallowing  New shortness of breath  Fever of 100F or higher  Black, tarry-looking stools  For urgent or emergent issues, a gastroenterologist can be  reached at any hour by calling [phone]. Do not use MyChart messaging for urgent concerns.    DIET:  We do recommend a small meal at first, but then you may proceed to your regular diet.  Drink plenty of fluids but you should avoid alcoholic beverages for 24 hours.  ACTIVITY:  You should plan to take it easy for the rest of today and you should NOT DRIVE or use heavy machinery until tomorrow (because of the sedation medicines used during the test).    FOLLOW UP: Our staff will call the number listed on your records the next business day following your procedure.  We will call around 7:15- 8:00 am to check on you and address any questions or concerns that you may have regarding the information given to you following your procedure. If we do not reach you, we will leave a message.     If any biopsies were taken you will be contacted by phone or by letter within the next 1-3 weeks.  Please call us at [phone] if you have not heard about the biopsies in 3 weeks.    SIGNATURES/CONFIDENTIALITY: You and/or your care partner have signed paperwork which will be entered into your electronic medical record.  These signatures attest to the fact that that the information above on your After Visit Summary has been reviewed and is understood.  Full responsibility of the confidentiality of this discharge information lies with you and/or your care-partner.

## 2022-09-20 NOTE — Progress Notes (Signed)
Called to room to assist during endoscopic procedure.  Patient ID and intended procedure confirmed with present staff. Received instructions for my participation in the procedure from the performing physician.  

## 2022-09-20 NOTE — Telephone Encounter (Signed)
PT has a colonoscopy/EGD today at 330 and has thrown up the prep. She took Miralax as well and her bowels are still yellow. Please advise.

## 2022-09-20 NOTE — Op Note (Signed)
[facility] Patient Name: Kristen Moore Procedure Date: 09/20/2022 3:00 PM MRN: [id number] Endoscopist: Sherilyn Cooter L. Myrtie Neither , MD, [id number] Age: 52 Referring MD:  Date of Birth: Apr 10, 1971 Gender: Female Account #: [id number] Procedure:                Colonoscopy Indications:              Rectal bleeding, Chronic diarrhea Medicines:                Monitored Anesthesia Care Procedure:                Pre-Anesthesia Assessment:                           - Prior to the procedure, a History and Physical                            was performed, and patient medications and                            allergies were reviewed. The patient's tolerance of                            previous anesthesia was also reviewed. The risks                            and benefits of the procedure and the sedation                            options and risks were discussed with the patient.                            All questions were answered, and informed consent                            was obtained. Prior Anticoagulants: The patient has                            taken no anticoagulant or antiplatelet agents. ASA                            Grade Assessment: II - A patient with mild systemic                            disease. After reviewing the risks and benefits,                            the patient was deemed in satisfactory condition to                            undergo the procedure.                           After obtaining informed consent, the colonoscope  was passed under direct vision. Throughout the                            procedure, the patient's blood pressure, pulse, and                            oxygen saturations were monitored continuously. The                            CF [id number] #[id number] was introduced through the anus                            and advanced to the the cecum, identified by                            appendiceal orifice  and ileocecal valve. The                            colonoscopy was somewhat difficult due to a                            redundant colon and significant looping. Successful                            completion of the procedure was aided by using                            manual pressure and straightening and shortening                            the scope to obtain bowel loop reduction. The                            patient tolerated the procedure well. The quality                            of the bowel preparation was good after lavage in                            the proximal right colon. The ileocecal valve,                            appendiceal orifice, and rectum were photographed.                            The terminal ileum could not be intubated due to                            redundant colon and scope looping. Scope In: 3:14:17 PM Scope Out: 3:35:48 PM Scope Withdrawal Time: 0 hours 17 minutes 54 seconds  Total Procedure Duration: 0 hours 21 minutes 31 seconds  Findings:                 The perianal  and digital rectal examinations were                            normal.                           A 12 mm polyp was found in the ileocecal valve. The                            polyp was multi-lobulated and sessile. The polyp                            was removed with a piecemeal technique using a cold                            snare. Resection and retrieval were complete.                           Normal mucosa was found in the entire colon.                            Biopsies for histology were taken with a cold                            forceps from the right colon and left colon for                            evaluation of microscopic colitis.                           Repeat examination of right colon under NBI                            performed.                           Internal hemorrhoids were found. The hemorrhoids                            were small.                            Retroflexion in the rectum was not performed due to                            anatomy.                           The exam was otherwise without abnormality. Complications:            No immediate complications. Estimated Blood Loss:     Estimated blood loss was minimal. Impression:               - One 12 mm polyp at the ileocecal valve, removed  piecemeal using a cold snare. Resected and                            retrieved.                           - Normal mucosa in the entire examined colon.                            Biopsied.                           - Internal hemorrhoids.                           - The examination was otherwise normal.                           - The GI Genius (intelligent endoscopy module),                            computer-aided polyp detection system powered by AI                            was utilized to detect colorectal polyps through                            enhanced visualization during colonoscopy. Recommendation:           - Patient has a contact number available for                            emergencies. The signs and symptoms of potential                            delayed complications were discussed with the                            patient. Return to normal activities tomorrow.                            Written discharge instructions were provided to the                            patient.                           - Resume previous diet.                           - Continue present medications.                           - Await pathology results.                           - Repeat colonoscopy is recommended for  surveillance. The colonoscopy date will be                            determined after pathology results from today's                            exam become available for review.                           - See the other procedure note for documentation  of                            additional recommendations. Gissela Bloch L. Myrtie Neither, MD 09/20/2022 3:53:54 PM This report has been signed electronically.

## 2022-09-20 NOTE — Telephone Encounter (Signed)
LM with pt's emergency contact to have pt call us

## 2022-09-20 NOTE — Telephone Encounter (Signed)
Attempted to call pt; no answer.  Busy signal

## 2022-09-20 NOTE — Telephone Encounter (Signed)
Call to pt phone again and it is just a busy signal, call to her emergency contact and asked them to have Kristen Moore call us back in Admitting to discuss her prep.

## 2022-09-20 NOTE — Op Note (Signed)
[facility] Patient Name: Kristen Moore Procedure Date: 09/20/2022 2:52 PM MRN: [id number] Endoscopist: Sherilyn Cooter L. Myrtie Neither , MD, [id number] Age: 52 Referring MD:  Date of Birth: 05/12/1971 Gender: Female Account #: [id number] Procedure:                Upper GI endoscopy Indications:              Epigastric abdominal pain, Diarrhea Medicines:                Monitored Anesthesia Care Procedure:                Pre-Anesthesia Assessment:                           - Prior to the procedure, a History and Physical                            was performed, and patient medications and                            allergies were reviewed. The patient's tolerance of                            previous anesthesia was also reviewed. The risks                            and benefits of the procedure and the sedation                            options and risks were discussed with the patient.                            All questions were answered, and informed consent                            was obtained. Prior Anticoagulants: The patient has                            taken no anticoagulant or antiplatelet agents. ASA                            Grade Assessment: II - A patient with mild systemic                            disease. After reviewing the risks and benefits,                            the patient was deemed in satisfactory condition to                            undergo the procedure.                           After obtaining informed consent, the endoscope was  passed under direct vision. Throughout the                            procedure, the patient's blood pressure, pulse, and                            oxygen saturations were monitored continuously. The                            GIF [id number] #[id number] was introduced through the                            mouth, and advanced to the second part of duodenum.                            The upper GI  endoscopy was accomplished without                            difficulty. The patient tolerated the procedure                            well. Scope In: Scope Out: Findings:                 The esophagus was normal.                           Normal mucosa was found in the entire examined                            stomach. Biopsies were taken with a cold forceps                            for histology. (Antrum and body in 1 jar)                           The cardia and gastric fundus were normal on                            retroflexion.                           Normal mucosa was found in the entire duodenum.                            Biopsies for histology were taken with a cold                            forceps for evaluation of celiac disease. Complications:            No immediate complications. Estimated Blood Loss:     Estimated blood loss was minimal. Impression:               - Normal esophagus.                           -  Normal mucosa was found in the entire stomach.                            Biopsied.                           - Normal mucosa was found in the entire examined                            duodenum. Biopsied. Recommendation:           - Patient has a contact number available for                            emergencies. The signs and symptoms of potential                            delayed complications were discussed with the                            patient. Return to normal activities tomorrow.                            Written discharge instructions were provided to the                            patient.                           - Resume previous diet.                           - Continue present medications.                           - Await pathology results.                           - See the other procedure note for documentation of                            additional recommendations.                           - Return to physician assistant  at appointment to                            be scheduled.                           - If all biopsies returned normal, consider testing                            for EPI (smoker) and SIBO as well as a trial of                            antispasmodic medicine  for possible IBS. [name] L. Myrtie Neither, MD 09/20/2022 3:56:59 PM This report has been signed electronically.

## 2022-09-20 NOTE — Progress Notes (Signed)
1506 Robinul 0.1 mg IV given due large amount of secretions upon assessment.  MD made aware, vss 

## 2022-09-20 NOTE — Telephone Encounter (Signed)
Return call to [phone] for note below

## 2022-09-21 ENCOUNTER — Telehealth: Payer: Self-pay

## 2022-09-21 NOTE — Telephone Encounter (Signed)
  Follow up Call-     09/20/2022    2:23 PM 03/07/2022   12:30 PM  Call back number  Post procedure Call Back phone  # [phone]  Permission to leave phone message Yes Yes     Patient questions:  Do you have a fever, pain , or abdominal swelling? No. Pain Score  0 *  Have you tolerated food without any problems? Yes.    Have you been able to return to your normal activities? Yes.    Do you have any questions about your discharge instructions: Diet   No. Medications  No. Follow up visit  No.  Do you have questions or concerns about your Care? No.  Actions: * If pain score is 4 or above: No action needed, pain <4.

## 2022-09-24 ENCOUNTER — Encounter (HOSPITAL_COMMUNITY): Payer: Commercial Managed Care - HMO | Admitting: Psychiatry
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
## 2022-09-26 ENCOUNTER — Other Ambulatory Visit (HOSPITAL_COMMUNITY): Payer: Self-pay

## 2022-09-26 ENCOUNTER — Encounter (HOSPITAL_COMMUNITY): Payer: Self-pay | Admitting: Psychiatry

## 2022-09-26 ENCOUNTER — Telehealth (INDEPENDENT_AMBULATORY_CARE_PROVIDER_SITE_OTHER): Payer: Commercial Managed Care - HMO | Admitting: Psychiatry

## 2022-09-26 ENCOUNTER — Encounter: Payer: Self-pay | Admitting: Gastroenterology

## 2022-09-26 DIAGNOSIS — F319 Bipolar disorder, unspecified: Secondary | ICD-10-CM | POA: Diagnosis not present

## 2022-09-26 DIAGNOSIS — F411 Generalized anxiety disorder: Secondary | ICD-10-CM | POA: Diagnosis not present

## 2022-09-26 MED ORDER — QUETIAPINE FUMARATE 50 MG PO TABS
ORAL_TABLET | ORAL | 2 refills | Status: DC
  Filled 2022-09-26: qty 30, 33d supply, fill #0
  Filled 2022-10-10: qty 30, 33d supply, fill #1
  Filled 2022-11-01: qty 30, 30d supply, fill #1
  Filled 2022-12-04: qty 30, 30d supply, fill #2

## 2022-09-26 NOTE — Patient Instructions (Signed)
Thank you for attending your appointment today.  -- START Seroquel 25 mg nightly for 6 days; if tolerating well increase to 50 mg nightly thereafter -- Continue other medications as prescribed.  Please do not make any changes to medications without first discussing with your provider. If you are experiencing a psychiatric emergency, please call 911 or present to your nearest emergency department. Additional crisis, medication management, and therapy resources are included below.  [facility]  [address], [place], [place] [address] [phone] WALK-IN URGENT CARE 24/7 FOR ANYONE [address], [place], [place]  [phone] Fax: [phone] [email or web address] *Interpreters available *Accepts all insurance and uninsured for Urgent Care needs *Accepts Medicaid and uninsured for outpatient treatment (below)      ONLY FOR [facility]  Below:    Outpatient New Patient Assessment/Therapy Walk-ins:        Monday -Thursday 8am until slots are full.        Every Friday 1pm-4pm  (first come, first served)                   New Patient Psychiatry/Medication Management        Monday-Friday 8am-11am (first come, first served)               For all walk-ins we ask that you arrive by 7:15am, because patients will be seen in the order of arrival.

## 2022-09-27 ENCOUNTER — Other Ambulatory Visit: Payer: Self-pay

## 2022-09-27 ENCOUNTER — Other Ambulatory Visit (HOSPITAL_COMMUNITY): Payer: Self-pay

## 2022-09-27 DIAGNOSIS — K529 Noninfective gastroenteritis and colitis, unspecified: Secondary | ICD-10-CM

## 2022-09-27 MED ORDER — DICYCLOMINE HCL 10 MG PO CAPS: ORAL_CAPSULE | ORAL | 1 refills | Status: DC

## 2022-09-27 MED ORDER — DICYCLOMINE HCL 10 MG PO CAPS
ORAL_CAPSULE | ORAL | 1 refills | Status: DC
  Filled 2022-09-27: qty 60, fill #0

## 2022-10-01 ENCOUNTER — Other Ambulatory Visit: Payer: Commercial Managed Care - HMO

## 2022-10-01 DIAGNOSIS — K529 Noninfective gastroenteritis and colitis, unspecified: Secondary | ICD-10-CM

## 2022-10-03 ENCOUNTER — Encounter (HOSPITAL_COMMUNITY): Payer: Self-pay

## 2022-10-03 ENCOUNTER — Emergency Department (HOSPITAL_COMMUNITY)
Admission: EM | Admit: 2022-10-03 | Discharge: 2022-10-03 | Disposition: A | Discharge status: Home / Self Care | Payer: Commercial Managed Care - HMO | Attending: Student | Admitting: Student

## 2022-10-03 ENCOUNTER — Emergency Department (HOSPITAL_COMMUNITY): Payer: Commercial Managed Care - HMO

## 2022-10-03 ENCOUNTER — Other Ambulatory Visit: Payer: Self-pay

## 2022-10-03 DIAGNOSIS — R109 Unspecified abdominal pain: Secondary | ICD-10-CM | POA: Diagnosis present

## 2022-10-03 DIAGNOSIS — Z91040 Latex allergy status: Secondary | ICD-10-CM | POA: Insufficient documentation

## 2022-10-03 DIAGNOSIS — R1012 Left upper quadrant pain: Secondary | ICD-10-CM

## 2022-10-03 DIAGNOSIS — K589 Irritable bowel syndrome without diarrhea: Secondary | ICD-10-CM | POA: Insufficient documentation

## 2022-10-03 LAB — CBC WITH DIFFERENTIAL/PLATELET
Abs Immature Granulocytes: 0.01 10*3/uL (ref 0.00–0.07)
Basophils Absolute: 0 10*3/uL (ref 0.0–0.1)
Basophils Relative: 1 %
Eosinophils Absolute: 0.2 10*3/uL (ref 0.0–0.5)
Eosinophils Relative: 3 %
HCT: 42.9 % (ref 36.0–46.0)
Hemoglobin: 14.1 g/dL (ref 12.0–15.0)
Immature Granulocytes: 0 %
Lymphocytes Relative: 28 %
Lymphs Abs: 1.4 10*3/uL (ref 0.7–4.0)
MCH: 32.2 pg (ref 26.0–34.0)
MCHC: 32.9 g/dL (ref 30.0–36.0)
MCV: 97.9 fL (ref 80.0–100.0)
Monocytes Absolute: 0.3 10*3/uL (ref 0.1–1.0)
Monocytes Relative: 6 %
Neutro Abs: 3.1 10*3/uL (ref 1.7–7.7)
Neutrophils Relative %: 62 %
Platelets: 173 10*3/uL (ref 150–400)
RBC: 4.38 MIL/uL (ref 3.87–5.11)
RDW: 11.9 % (ref 11.5–15.5)
WBC: 5.1 10*3/uL (ref 4.0–10.5)
nRBC: 0 % (ref 0.0–0.2)

## 2022-10-03 LAB — URINALYSIS, ROUTINE W REFLEX MICROSCOPIC
Bilirubin Urine: NEGATIVE
Glucose, UA: NEGATIVE mg/dL
Hgb urine dipstick: NEGATIVE
Ketones, ur: NEGATIVE mg/dL
Leukocytes,Ua: NEGATIVE
Nitrite: NEGATIVE
Protein, ur: NEGATIVE mg/dL
Specific Gravity, Urine: 1.005 (ref 1.005–1.030)
pH: 7 (ref 5.0–8.0)

## 2022-10-03 LAB — COMPREHENSIVE METABOLIC PANEL
ALT: 19 U/L (ref 0–44)
AST: 16 U/L (ref 15–41)
Albumin: 4 g/dL (ref 3.5–5.0)
Alkaline Phosphatase: 54 U/L (ref 38–126)
Anion gap: 7 (ref 5–15)
BUN: 14 mg/dL (ref 6–20)
CO2: 26 mmol/L (ref 22–32)
Calcium: 9.7 mg/dL (ref 8.9–10.3)
Chloride: 105 mmol/L (ref 98–111)
Creatinine, Ser: 0.8 mg/dL (ref 0.44–1.00)
GFR, Estimated: >60 mL/min (ref >60)
Glucose, Bld: 101 mg/dL — ABNORMAL HIGH (ref 70–99)
Potassium: 3.9 mmol/L (ref 3.5–5.1)
Sodium: 138 mmol/L (ref 135–145)
Total Bilirubin: 0.8 mg/dL (ref 0.3–1.2)
Total Protein: 6.8 g/dL (ref 6.5–8.1)

## 2022-10-03 LAB — LIPASE, BLOOD: Lipase: 29 U/L (ref 11–51)

## 2022-10-03 MED ORDER — OXYCODONE-ACETAMINOPHEN 5-325 MG PO TABS
1.0000 | ORAL_TABLET | Freq: Once | ORAL | Status: AC
  Administered 2022-10-03: 1 via ORAL
  Filled 2022-10-03: qty 1

## 2022-10-03 MED ORDER — SUCRALFATE 1 G PO TABS
1.0000 g | ORAL_TABLET | Freq: Three times a day (TID) | ORAL | 0 refills | Status: DC
  Filled 2022-10-03: qty 60, 15d supply, fill #0

## 2022-10-03 NOTE — ED Notes (Signed)
Patient transported to CT 

## 2022-10-03 NOTE — ED Triage Notes (Signed)
Pt to er, pt states that she has been having some abd pain off and on for the past week, pt c/o LUQ pain. States that sometimes after she eats she also gets some sharp pain in her abd.

## 2022-10-03 NOTE — ED Provider Notes (Signed)
[place] EMERGENCY DEPARTMENT AT [facility] Provider Note   CSN: [id number] Arrival date & time: 10/03/22  1245     History Chief Complaint  Patient presents with   Abdominal Pain    Kristen Moore is a 52 y.o. female. Patient presented to the ED for concerns of abdominal pain. Reports left sided abdominal pain off and on for the last 5 days. States pain was initially strong and sharp, but is now just a dull ache that will not resolve. Denies any nausea, vomiting, diarrhea, constipation, or urinary symptoms. Prior history of cholecystectomy. Has a history of IBS and recently had colonoscopy performed about 2-3 weeks ago in which a polyp was found that was benign. Denies hematochezia, hematemesis, or hematuria. Primarily concerned for persistent pain that does not appear to have clear etiology and not improving with food or water intake.   Abdominal Pain      Home Medications Prior to Admission medications   Medication Sig Start Date End Date Taking? Authorizing Provider  sucralfate (CARAFATE) 1 g tablet Take 1 tablet (1 g total) by mouth 4 (four) times daily -  with meals and at bedtime for 15 days. 10/03/22 10/19/22 Yes Smitty Knudsen, PA-C  atorvastatin (LIPITOR) 40 MG tablet Take 1 tablet (40 mg total) by mouth daily. 07/11/22   Hoy Register, MD  buPROPion (WELLBUTRIN XL) 300 MG 24 hr tablet Take 1 tablet (300 mg total) by mouth daily. 08/09/22   Nwoko, Tommas Olp, PA  cetirizine (ZYRTEC) 10 MG tablet Take 1 tablet (10 mg total) by mouth daily. Patient not taking: Reported on 09/20/2022 05/30/22   Hoy Register, MD  dicyclomine (BENTYL) 10 MG capsule Take 1 tablet (10 mg total) by mouth 2-3 times daily as needed for abdominal cramps and diarrhea 09/27/22   Sherrilyn Rist, MD  erythromycin ophthalmic ointment Place a 1/2 inch ribbon of ointment into the lower eyelid. 09/05/22   White, Elita Boone, NP  hydrOXYzine (ATARAX) 10 MG tablet Take 1/2 to 1 tablet (5 mg to 10  mg) up to three times a day as needed for anxiety or sleep. 08/09/22   Nwoko, Tommas Olp, PA  levothyroxine (SYNTHROID) 137 MCG tablet Take 1 tablet (137 mcg total) by mouth daily. 06/08/22   Hoy Register, MD  lithium carbonate 300 MG capsule TAKE 1 CAPSULE BY MOUTH EVERY MORNING & TAKE 2 CAPSULES EVERY NIGHT AT BEDTIME 08/09/22   Nwoko, Tommas Olp, PA  Multiple Vitamins-Minerals (ONE-A-DAY WOMENS PO) Take by mouth. Patient not taking: Reported on 09/20/2022    [provider]  pantoprazole (PROTONIX) 40 MG tablet Take 1 tablet (40 mg total) by mouth daily. Patient not taking: Reported on 09/20/2022 09/05/22   Doree Albee, PA-C  QUEtiapine (SEROQUEL) 50 MG tablet Take 1/2 tablet (25 mg) nightly for 6 days then increase to 1 tablet (50 mg) nightly 09/26/22   Bahraini, Sarah A      Allergies    Bee venom, Latex, Penicillins, Ibuprofen, Omega-3, and Other    Review of Systems   Review of Systems  Gastrointestinal:  Positive for abdominal pain.  All other systems reviewed and are negative.   Physical Exam Updated Vital Signs BP 128/76   Pulse (!) 59   Temp 97.9 F (36.6 C) (Oral)   Resp 18   Ht 5\' 4"  (1.626 m)   Wt 90.3 kg   LMP 11/29/2012   SpO2 97%   BMI 34.16 kg/m  Physical Exam Vitals and  nursing note reviewed.  Constitutional:      General: She is not in acute distress.    Appearance: She is well-developed.  HENT:     Head: Normocephalic and atraumatic.  Eyes:     Conjunctiva/sclera: Conjunctivae normal.  Cardiovascular:     Rate and Rhythm: Normal rate and regular rhythm.     Heart sounds: No murmur heard. Pulmonary:     Effort: Pulmonary effort is normal. No respiratory distress.     Breath sounds: Normal breath sounds.  Abdominal:     Palpations: Abdomen is soft.     Tenderness: There is abdominal tenderness in the suprapubic area and left lower quadrant. There is no right CVA tenderness, left CVA tenderness, guarding or rebound. Negative signs include  Murphy's sign, Rovsing's sign and McBurney's sign.  Musculoskeletal:        General: No swelling.     Cervical back: Neck supple.  Skin:    General: Skin is warm and dry.     Capillary Refill: Capillary refill takes less than 2 seconds.  Neurological:     Mental Status: She is alert.  Psychiatric:        Mood and Affect: Mood normal.     ED Results / Procedures / Treatments   Labs (all labs ordered are listed, but only abnormal results are displayed) Labs Reviewed  COMPREHENSIVE METABOLIC PANEL - Abnormal; Notable for the following components:      Result Value   Glucose, Bld 101 (*)    All other components within normal limits  URINALYSIS, ROUTINE W REFLEX MICROSCOPIC - Abnormal; Notable for the following components:   Color, Urine STRAW (*)    All other components within normal limits  CBC WITH DIFFERENTIAL/PLATELET  LIPASE, BLOOD    EKG None  Radiology No results found.  Procedures Procedures   Medications Ordered in ED Medications  oxyCODONE-acetaminophen (PERCOCET/ROXICET) 5-325 MG per tablet 1 tablet (1 tablet Oral Given 10/03/22 1349)    ED Course/ Medical Decision Making/ A&P                           Medical Decision Making Amount and/or Complexity of Data Reviewed Labs: ordered. Radiology: ordered.  Risk Prescription drug management.   This patient presents to the ED for concern of abdominal pain.  Differential diagnosis includes gastritis, bowel obstruction, constipation, appendicitis, diverticulitis   Lab Tests:  I Ordered, and personally interpreted labs.  The pertinent results include: CBC and CMP unremarkable, lipase negative, urinalysis without evidence of UTI   Imaging Studies ordered:  I ordered imaging studies including CT abdomen pelvis I independently visualized and interpreted imaging which showed no acute abdominal abnormality I agree with the radiologist interpretation   Medicines ordered and prescription drug  management:  I ordered medication including Percocet for pain Reevaluation of the patient after these medicines showed that the patient improved I have reviewed the patients home medicines and have made adjustments as needed   Problem List / ED Course:  Patient presents emerged part complaints of abdominal pain.  Reports this been present off and on for the last week or so.  Reports the pain is primarily left upper quadrant without radiation into any other aspect.  Reports a prior history significant for IBS and has been followed up with gastroenterology with a recent colonoscopy.  Given unclear etiology patient's symptoms, I believe that outpatient gastroenterology follow-up is advisable as patient appears to be stable here and no  acute abnormalities noted on labs or imaging. Discussed return precautions such as worsening abdominal pain, inability to have bowel movements, or rectal bleeding. Very low concern at this time for gastroenteritis, diverticulitis, bowel obstruction, or UTI. Patient is agreeable with treatment plan and verbalized understanding all return precautions.   Final Clinical Impression(s) / ED Diagnoses Final diagnoses:  Left upper quadrant abdominal pain  Irritable bowel syndrome, unspecified type    Rx / DC Orders ED Discharge Orders          Ordered    sucralfate (CARAFATE) 1 g tablet  3 times daily with meals & bedtime        10/03/22 1754              Smitty Knudsen, PA-C 10/05/22 2039    Glendora Score, MD 10/06/22 [phone]

## 2022-10-03 NOTE — Discharge Instructions (Signed)
You were seen in the emergency department for abdominal pain.  Thankfully your lab workup and imaging was reassuring at this time without any acute abnormality noted on these results.  I would recommend that you follow-up with your gastroenterologist for further evaluation given that you have had already established care with them.  If you have any acute worsening of your symptoms such as rectal bleeding, severe vomiting, worsening abdominal pain and cramping, please return to the emergency department for further evaluation.

## 2022-10-04 ENCOUNTER — Other Ambulatory Visit: Payer: Self-pay

## 2022-10-04 ENCOUNTER — Other Ambulatory Visit (HOSPITAL_COMMUNITY): Payer: Self-pay

## 2022-10-05 ENCOUNTER — Other Ambulatory Visit: Payer: Self-pay

## 2022-10-05 ENCOUNTER — Telehealth: Payer: Self-pay | Admitting: Gastroenterology

## 2022-10-05 NOTE — Telephone Encounter (Signed)
Pancreatic fecal elastase has not resulted. Called and informed patient that once result has returned and has been reviewed by provider she will be notified. Pt verbalized understanding and had no concerns at the end of the call.

## 2022-10-05 NOTE — Telephone Encounter (Signed)
PT is calling to discuss results of her stool sample. Please advise.

## 2022-10-06 LAB — PANCREATIC ELASTASE, FECAL: Pancreatic Elastase-1, Stool: >500 mcg/g

## 2022-10-10 ENCOUNTER — Encounter (INDEPENDENT_AMBULATORY_CARE_PROVIDER_SITE_OTHER): Payer: Commercial Managed Care - HMO | Admitting: Ophthalmology

## 2022-10-10 ENCOUNTER — Telehealth (HOSPITAL_COMMUNITY): Payer: Self-pay

## 2022-10-10 ENCOUNTER — Other Ambulatory Visit: Payer: Self-pay | Admitting: Family Medicine

## 2022-10-10 DIAGNOSIS — H3581 Retinal edema: Secondary | ICD-10-CM

## 2022-10-10 DIAGNOSIS — E039 Hypothyroidism, unspecified: Secondary | ICD-10-CM

## 2022-10-10 NOTE — Telephone Encounter (Signed)
Requested medication (s) are due for refill today: yes  Requested medication (s) are on the active medication list: yes  Last refill:  06/08/22  Future visit scheduled: yes  Notes to clinic:  Unable to refill per protocol due to failed labs, no updated TSH results. Provider's note states to repeat labs in 6 weeks of last lab work which was done 12/23. Routing for approval.      Requested Prescriptions  Pending Prescriptions Disp Refills   levothyroxine (SYNTHROID) 137 MCG tablet 30 tablet 3    Sig: Take 1 tablet (137 mcg total) by mouth daily.     Endocrinology:  Hypothyroid Agents Failed - 10/10/2022  3:56 PM      Failed - TSH in normal range and within 360 days    TSH  Date Value Ref Range Status  05/30/2022 0.444 (L) 0.450 - 4.500 uIU/mL Final         Passed - Valid encounter within last 12 months    Recent Outpatient Visits           4 months ago Hypothyroidism, unspecified type   [place] [facility] Hoy Register, MD   10 months ago Bipolar depression [facility])   [place] Hoy Register, MD   1 year ago Urine frequency   [facility], Gildardo Pounds, NP   1 year ago Hypothyroidism, unspecified type   [place] [facility] Hoy Register, MD   1 year ago Acute upper respiratory infection   [facility], Kasandra Knudsen, [place]       Future Appointments             In 3 weeks Hoy Register, MD [facility]   In 2 months Terri Piedra, MD [facility]

## 2022-10-11 ENCOUNTER — Other Ambulatory Visit (HOSPITAL_BASED_OUTPATIENT_CLINIC_OR_DEPARTMENT_OTHER): Payer: Self-pay

## 2022-10-11 ENCOUNTER — Other Ambulatory Visit (HOSPITAL_COMMUNITY): Payer: Self-pay

## 2022-10-11 ENCOUNTER — Other Ambulatory Visit: Payer: Self-pay

## 2022-10-11 MED ORDER — LEVOTHYROXINE SODIUM 137 MCG PO TABS
137.0000 ug | ORAL_TABLET | Freq: Every day | ORAL | 0 refills | Status: DC
Start: 2022-10-11 — End: 2022-11-01
  Filled 2022-10-11: qty 30, 30d supply, fill #0

## 2022-10-11 MED ORDER — DICYCLOMINE HCL 10 MG PO CAPS: ORAL_CAPSULE | ORAL | 1 refills | Status: AC

## 2022-10-12 ENCOUNTER — Other Ambulatory Visit: Payer: Self-pay

## 2022-10-16 ENCOUNTER — Telehealth (HOSPITAL_COMMUNITY): Payer: Self-pay | Admitting: *Deleted

## 2022-10-16 ENCOUNTER — Ambulatory Visit: Payer: Self-pay | Admitting: *Deleted

## 2022-10-16 NOTE — Telephone Encounter (Signed)
  Chief Complaint: left ear pain Symptoms: pain in ear- worse at night Frequency: over 1 week Pertinent Negatives: Patient denies headache, stiff neck, vomiting, runny nose, decreased hearing)  Disposition: [] ED /[x] Urgent Care (no appt availability in office) / [] Appointment(In office/virtual)/ []  [place] [facility]/ [] Home Care/ [] Refused Recommended Disposition /[] [place] [facility]/ []  Follow-up with PCP Additional Notes: Patient states she is working and can not get off until Friday- advised UC after work hours due to pain.

## 2022-10-16 NOTE — Telephone Encounter (Signed)
Pharmacy sent a request for a 90 day supply of Bupropion XL 300mg  as well Lithium carbonate 300mg .

## 2022-10-16 NOTE — Telephone Encounter (Signed)
Several attempts have been made to contact  patient unable to reach . Unable to leave message. Line is not an operating line.

## 2022-10-16 NOTE — Telephone Encounter (Signed)
Left earleft ear Reason for Disposition  [1] SEVERE pain AND [2] not improved 2 hours after taking analgesic medication (e.g., ibuprofen or acetaminophen)  Answer Assessment - Initial Assessment Questions 1. LOCATION: "Which ear is involved?"     Left ear 2. ONSET: "When did the ear start hurting"      Over 1 week 3. SEVERITY: "How bad is the pain?"  (Scale 1-10; mild, moderate or severe)   - MILD (1-3): doesn't interfere with normal activities    - MODERATE (4-7): interferes with normal activities or awakens from sleep    - SEVERE (8-10): excruciating pain, unable to do any normal activities      Sore to touch- pain in ear- severe at night 4. URI SYMPTOMS: "Do you have a runny nose or cough?"     no 5. FEVER: "Do you have a fever?" If Yes, ask: "What is your temperature, how was it measured, and when did it start?"     no 6. CAUSE: "Have you been swimming recently?", "How often do you use Q-TIPS?", "Have you had any recent air travel or scuba diving?"     unsure 7. OTHER SYMPTOMS: "Do you have any other symptoms?" (e.g., headache, stiff neck, dizziness, vomiting, runny nose, decreased hearing)     Slight dizziness  Protocols used: Earache-A-AH

## 2022-10-17 ENCOUNTER — Other Ambulatory Visit (HOSPITAL_COMMUNITY): Payer: Self-pay | Admitting: Physician Assistant

## 2022-10-17 DIAGNOSIS — F319 Bipolar disorder, unspecified: Secondary | ICD-10-CM

## 2022-10-17 MED ORDER — LITHIUM CARBONATE 300 MG PO CAPS
ORAL_CAPSULE | ORAL | 3 refills | Status: DC
Start: 2022-10-17 — End: 2022-12-13

## 2022-10-17 MED ORDER — BUPROPION HCL ER (XL) 300 MG PO TB24: 300.0000 mg | ORAL_TABLET | Freq: Every day | ORAL | 3 refills | Status: DC

## 2022-10-17 NOTE — Progress Notes (Signed)
Provider was contacted by Elder Love, RN and Carlene Coria, CMA regarding patient's request for 90-day supplies of her bupropion and lithium.  Provider to provide 90-day supplies of patient's medications.  Patient's medications to be e-prescribed to pharmacy of choice.

## 2022-10-17 NOTE — Telephone Encounter (Signed)
Message acknowledged and reviewed. Patient's medications were be prescribed to pharmacy of choice.

## 2022-10-17 NOTE — Telephone Encounter (Signed)
Message acknowledged and reviewed.

## 2022-10-17 NOTE — Telephone Encounter (Signed)
Message acknowledged and reviewed.  Patient medications were e-prescribed to pharmacy of choice.

## 2022-10-19 ENCOUNTER — Other Ambulatory Visit (HOSPITAL_COMMUNITY): Payer: Self-pay

## 2022-11-01 ENCOUNTER — Other Ambulatory Visit: Payer: Self-pay | Admitting: Family Medicine

## 2022-11-01 ENCOUNTER — Other Ambulatory Visit: Payer: Self-pay

## 2022-11-01 ENCOUNTER — Telehealth: Payer: Self-pay

## 2022-11-01 DIAGNOSIS — E039 Hypothyroidism, unspecified: Secondary | ICD-10-CM

## 2022-11-01 MED ORDER — LEVOTHYROXINE SODIUM 137 MCG PO TABS
137.0000 ug | ORAL_TABLET | Freq: Every day | ORAL | 0 refills | Status: DC
Start: 2022-11-01 — End: 2022-12-04
  Filled 2022-11-01: qty 30, 30d supply, fill #0

## 2022-11-01 NOTE — Telephone Encounter (Signed)
Copied from CRM [id number]. Topic: General - Other >> Nov 01, 2022 12:28 PM Kristen Moore wrote: Reason for CRM: Pt wants to know if she will be getting her thyroid checked again at her next appointment. Please advise.

## 2022-11-02 ENCOUNTER — Encounter (HOSPITAL_COMMUNITY): Payer: Self-pay | Admitting: Licensed Clinical Social Worker

## 2022-11-02 ENCOUNTER — Ambulatory Visit (INDEPENDENT_AMBULATORY_CARE_PROVIDER_SITE_OTHER): Payer: Commercial Managed Care - HMO | Admitting: Licensed Clinical Social Worker

## 2022-11-02 DIAGNOSIS — F319 Bipolar disorder, unspecified: Secondary | ICD-10-CM | POA: Diagnosis not present

## 2022-11-02 DIAGNOSIS — F411 Generalized anxiety disorder: Secondary | ICD-10-CM

## 2022-11-02 DIAGNOSIS — F322 Major depressive disorder, single episode, severe without psychotic features: Secondary | ICD-10-CM

## 2022-11-02 NOTE — Telephone Encounter (Signed)
Pt sent a mychart message
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
## 2022-11-05 ENCOUNTER — Ambulatory Visit: Payer: Commercial Managed Care - HMO | Admitting: Family Medicine

## 2022-11-20 NOTE — Progress Notes (Unsigned)
Patient did not connect for virtual psychiatric medication management appointment on 11/21/22 at 3PM. Sent secure video link with no response. Left VM with callback number to reschedule.  Daine Gip, MD 11/21/22

## 2022-11-21 ENCOUNTER — Encounter (HOSPITAL_COMMUNITY): Payer: Self-pay

## 2022-11-21 ENCOUNTER — Encounter (HOSPITAL_COMMUNITY): Payer: Commercial Managed Care - HMO | Admitting: Psychiatry

## 2022-11-29 NOTE — Progress Notes (Signed)
[facility] - Clinic Note  12/05/2022   CHIEF COMPLAINT Patient presents for Retina Evaluation  HISTORY OF PRESENT ILLNESS: Kristen Moore is a 52 y.o. female who presents to the clinic today for:  HPI     Retina Evaluation   In both eyes.  I, the attending physician,  performed the HPI with the patient and updated documentation appropriately.        Comments   Patient here for Retina Evaluation. Referred by Lynita Lombard PA for DM exam. Patient states vision is blurry. Especially up close. Has to use a flashlight to see phone. Had an abrasion OS about 2 months ago. Went to ER given ung. Had ran into a stick at night. Night vision not good. Was hit temporally left side. Had to have stitches. Has glasses. Doesn't wear. Didn't bring. Not using drops now.       Last edited by Rennis Chris, MD on 12/05/2022 11:37 PM.    Patient states had abrasion injury OS about 2 months ago. Night vision is not good.    Referring physician: Hoy Register, MD [address] [place],  [place] [address]  HISTORICAL INFORMATION:  Selected notes from the MEDICAL RECORD NUMBER Referred by Dr. Nedra Hai:  Ocular Hx- PMH-   CURRENT MEDICATIONS: Current Outpatient Medications (Ophthalmic Drugs)  Medication Sig   erythromycin ophthalmic ointment Place a 1/2 inch ribbon of ointment into the lower eyelid.   No current facility-administered medications for this visit. (Ophthalmic Drugs)   Current Outpatient Medications (Other)  Medication Sig   atorvastatin (LIPITOR) 40 MG tablet Take 1 tablet (40 mg total) by mouth daily.   buPROPion (WELLBUTRIN XL) 300 MG 24 hr tablet Take 1 tablet (300 mg total) by mouth daily.   dicyclomine (BENTYL) 10 MG capsule Take 1 tablet (10 mg total) by mouth 2-3 times daily as needed for abdominal cramps and diarrhea   hydrOXYzine (ATARAX) 10 MG tablet Take 1/2 to 1 tablet (5 mg to 10 mg) up to three times a day as needed for anxiety or  sleep.   levothyroxine (SYNTHROID) 137 MCG tablet Take 1 tablet (137 mcg total) by mouth daily.   lithium carbonate 300 MG capsule TAKE 1 CAPSULE BY MOUTH EVERY MORNING & TAKE 2 CAPSULES EVERY NIGHT AT BEDTIME   QUEtiapine (SEROQUEL) 50 MG tablet Take 1/2 tablet (25 mg) nightly for 6 days then increase to 1 tablet (50 mg) nightly   cetirizine (ZYRTEC) 10 MG tablet Take 1 tablet (10 mg total) by mouth daily. (Patient not taking: Reported on 09/20/2022)   Multiple Vitamins-Minerals (ONE-A-DAY WOMENS PO) Take by mouth. (Patient not taking: Reported on 09/20/2022)   pantoprazole (PROTONIX) 40 MG tablet Take 1 tablet (40 mg total) by mouth daily. (Patient not taking: Reported on 09/20/2022)   sucralfate (CARAFATE) 1 g tablet Take 1 tablet (1 g total) by mouth 4 (four) times daily -  with meals and at bedtime for 15 days.   No current facility-administered medications for this visit. (Other)   REVIEW OF SYSTEMS: ROS   Positive for: Endocrine, Eyes Negative for: Constitutional, Gastrointestinal, Neurological, Skin, Genitourinary, Musculoskeletal, HENT, Cardiovascular, Respiratory, Psychiatric, Allergic/Imm, Heme/Lymph Last edited by Annalee Genta D, COT on 12/05/2022  2:17 PM.     ALLERGIES Allergies  Allergen Reactions   Bee Venom Swelling and Anaphylaxis   Latex Swelling   Penicillins Swelling    Has patient had a PCN reaction causing immediate rash, facial/tongue/throat swelling, SOB or lightheadedness  with hypotension: yes Has patient had a PCN reaction causing severe rash involving mucus membranes or skin necrosis: no Has patient had a PCN reaction that required hospitalization no Has patient had a PCN reaction occurring within the last 10 years: no If all of the above answers are "NO", then may proceed with Cephalosporin   Ibuprofen Swelling   Omega-3 Hives and Swelling    Potatoes     Other Hives and Swelling    Potatoes      PAST MEDICAL HISTORY Past Medical History:  Diagnosis Date    Abnormal Pap smear    cryo   Anemia    Asthma    as a child   Bipolar 1 disorder (HCC)    Cyst of breast    left breast   DUB (dysfunctional uterine bleeding)    Fibroids    Headache(784.0)    Hypothyroidism    IBS (irritable bowel syndrome)    Thyroid dysfunction    Past Surgical History:  Procedure Laterality Date   ABDOMINAL HYSTERECTOMY Bilateral 12/25/2012   Procedure: HYSTERECTOMY ABDOMINAL;  Surgeon: Allie Bossier, MD;  Location: [facility];  Service: Gynecology;  Laterality: Bilateral;  TAH and bilateral salpingectomy   BILATERAL SALPINGECTOMY Bilateral 12/25/2012   Procedure: BILATERAL SALPINGECTOMY;  Surgeon: Allie Bossier, MD;  Location: [facility];  Service: Gynecology;  Laterality: Bilateral;   CERVICAL BIOPSY  W/ LOOP ELECTRODE EXCISION     CHOLECYSTECTOMY     COLONOSCOPY     10 + years ago   DILATION AND CURETTAGE OF UTERUS     OOPHORECTOMY Bilateral 12/25/2012   Procedure: OOPHORECTOMY;  Surgeon: Allie Bossier, MD;  Location: [facility];  Service: Gynecology;  Laterality: Bilateral;   TUBAL LIGATION     UPPER GASTROINTESTINAL ENDOSCOPY     10 + years   FAMILY HISTORY Family History  Problem Relation Age of Onset   Hypertension Mother    Rheum arthritis Mother    Fibromyalgia Mother    COPD Mother    Sleep apnea Mother    Thyroid disease Mother    Ovarian cancer Mother 20   Heart disease Father    Lung cancer Father        dx 36s   Skin cancer Maternal Uncle        melanoma on back   Breast cancer Paternal Aunt        dx 33s   Lung cancer Paternal Uncle 44       mets to brain   Breast cancer Maternal Grandmother        dx 49s   Colon cancer Other        MGF's brother; dx 22s   Esophageal cancer Neg Hx    Stomach cancer Neg Hx    SOCIAL HISTORY Social History   Tobacco Use   Smoking status: Every Day    Packs/day: 0.50    Years: 9.00    Additional pack years: 0.00    Total pack years: 4.50    Types: Cigarettes   Smokeless tobacco: Never  Vaping Use    Vaping Use: Never used  Substance Use Topics   Alcohol use: No    Alcohol/week: 0.0 standard drinks of alcohol   Drug use: No       OPHTHALMIC EXAM:  Base Eye Exam     Visual Acuity (Snellen - Linear)       Right Left   Dist [place] 20/100 +2 20/300   Dist ph [place]  20/40 20/50 -2         Tonometry (Tonopen, 1:17 PM)       Right Left   Pressure 21 18         Pupils       Dark Light Shape React APD   Right 3 2 Round Brisk None   Left 3 2 Round Brisk None         Visual Fields (Counting fingers)       Left Right    Full Full         Extraocular Movement       Right Left    Full, Ortho Full, Ortho         Neuro/Psych     Oriented x3: Yes   Mood/Affect: Normal         Dilation     Both eyes: 1.0% Mydriacyl, 2.5% Phenylephrine @ 1:17 PM           Slit Lamp and Fundus Exam     Slit Lamp Exam       Right Left   Lids/Lashes mild dermatochalasis mild dermatochalasis   Conjunctiva/Sclera White and quiet white and quiet   Cornea Clear trace PEE, no epi defect   Anterior Chamber deep and clear deep and clear   Iris round and dilated round and dilated   Lens clear clear   Anterior Vitreous syneresis syneresis         Fundus Exam       Right Left   Disc elevated pink, sharp, mild elevation   C/D Ratio 0.1 0.1   Macula flat, good foveal reflex, RPE mottling, no heme or edema flat, good foveal reflex, RPE mottling, no heme or edema   Vessels mild attenuation, mild tortuosity mild attenuation, mild tortuosity   Periphery attached, no heme attached, no heme           Refraction     Manifest Refraction       Sphere Cylinder Axis Dist VA   Right -1.75   20/30+1   Left -5.25 +2.50 148 20/30           IMAGING AND PROCEDURES  Imaging and Procedures for 12/05/2022  OCT, Retina - OU - Both Eyes       Right Eye Quality was good. Central Foveal Thickness: 290. Progression has no prior data. Findings include normal foveal contour,  no IRF, no SRF, myopic contour, vitreomacular adhesion (Disc elevation).   Left Eye Quality was good. Central Foveal Thickness: 275. Progression has no prior data. Findings include normal foveal contour, no IRF, no SRF, myopic contour.   Notes *Images captured and stored on drive  Diagnosis / Impression:  NFP, no IRF/SRF OU Myopic contour OU  Clinical management:  See below  Abbreviations: NFP - Normal foveal profile. CME - cystoid macular edema. PED - pigment epithelial detachment. IRF - intraretinal fluid. SRF - subretinal fluid. EZ - ellipsoid zone. ERM - epiretinal membrane. ORA - outer retinal atrophy. ORT - outer retinal tubulation. SRHM - subretinal hyper-reflective material. IRHM - intraretinal hyper-reflective material            ASSESSMENT/PLAN:   ICD-10-CM   1. History of corneal abrasion  Z87.828     2. Retinal edema  H35.81 OCT, Retina - OU - Both Eyes     History of corneal abrasion OS   - pt here based on Urgent Care referral from 04.03.24  - pt ran into a stick while walking at night  and suffered a corneal abrasion OS  - pt was prescribed erythromycin ointment and corneal abrasion healed without complication  - cornea clear today: trace PEE with no epi defect  - dilated eye exam normal OU - no retinal or ophthalmic interventions indicated or recommended   - recommend f/u with general ophthalmology or optometry for refraction and general eye care  2. No retinal edema on exam or OCT   Ophthalmic Meds Ordered this visit:  No orders of the defined types were placed in this encounter.    Return if symptoms worsen or fail to improve.  There are no Patient Instructions on file for this visit.  Explained the diagnoses, plan, and follow up with the patient and they expressed understanding.  Patient expressed understanding of the importance of proper follow up care.   This document serves as a record of services personally performed by Karie Chimera, MD,  PhD. It was created on their behalf by De Blanch, an ophthalmic technician. The creation of this record is the provider's dictation and/or activities during the visit.    Electronically signed by: De Blanch, OA, 12/05/22  11:45 PM  This document serves as a record of services personally performed by Karie Chimera, MD, PhD. It was created on their behalf by Annalee Genta, COMT. The creation of this record is the provider's dictation and/or activities during the visit.  Electronically signed by: Annalee Genta, COMT 12/05/22 11:45 PM  Karie Chimera, M.D., Ph.D. Diseases & Surgery of the Retina and Vitreous [facility] 12/05/2022  I have reviewed the above documentation for accuracy and completeness, and I agree with the above. Karie Chimera, M.D., Ph.D. 12/05/22 11:45 PM   Abbreviations: M myopia (nearsighted); A astigmatism; H hyperopia (farsighted); P presbyopia; Mrx spectacle prescription;  CTL contact lenses; OD right eye; OS left eye; OU both eyes  XT exotropia; ET esotropia; PEK punctate epithelial keratitis; PEE punctate epithelial erosions; DES dry eye syndrome; MGD meibomian gland dysfunction; ATs artificial tears; PFAT's preservative free artificial tears; NSC nuclear sclerotic cataract; PSC posterior subcapsular cataract; ERM epi-retinal membrane; PVD posterior vitreous detachment; RD retinal detachment; DM diabetes mellitus; DR diabetic retinopathy; NPDR non-proliferative diabetic retinopathy; PDR proliferative diabetic retinopathy; CSME clinically significant macular edema; DME diabetic macular edema; dbh dot blot hemorrhages; CWS cotton wool spot; POAG primary open angle glaucoma; C/D cup-to-disc ratio; HVF humphrey visual field; GVF goldmann visual field; OCT optical coherence tomography; IOP intraocular pressure; BRVO Branch retinal vein occlusion; CRVO central retinal vein occlusion; CRAO central retinal artery occlusion; BRAO branch retinal  artery occlusion; RT retinal tear; SB scleral buckle; PPV pars plana vitrectomy; VH Vitreous hemorrhage; PRP panretinal laser photocoagulation; IVK intravitreal kenalog; VMT vitreomacular traction; MH Macular hole;  NVD neovascularization of the disc; NVE neovascularization elsewhere; AREDS age related eye disease study; ARMD age related macular degeneration; POAG primary open angle glaucoma; EBMD epithelial/anterior basement membrane dystrophy; ACIOL anterior chamber intraocular lens; IOL intraocular lens; PCIOL posterior chamber intraocular lens; Phaco/IOL phacoemulsification with intraocular lens placement; PRK photorefractive keratectomy; LASIK laser assisted in situ keratomileusis; HTN hypertension; DM diabetes mellitus; COPD chronic obstructive pulmonary disease

## 2022-12-04 ENCOUNTER — Other Ambulatory Visit (HOSPITAL_COMMUNITY): Payer: Self-pay

## 2022-12-04 ENCOUNTER — Other Ambulatory Visit (HOSPITAL_COMMUNITY): Payer: Self-pay | Admitting: Physician Assistant

## 2022-12-04 ENCOUNTER — Other Ambulatory Visit: Payer: Self-pay

## 2022-12-04 ENCOUNTER — Other Ambulatory Visit: Payer: Self-pay | Admitting: Family Medicine

## 2022-12-04 DIAGNOSIS — F319 Bipolar disorder, unspecified: Secondary | ICD-10-CM

## 2022-12-04 DIAGNOSIS — E039 Hypothyroidism, unspecified: Secondary | ICD-10-CM

## 2022-12-04 DIAGNOSIS — F411 Generalized anxiety disorder: Secondary | ICD-10-CM

## 2022-12-04 MED ORDER — LEVOTHYROXINE SODIUM 137 MCG PO TABS
137.0000 ug | ORAL_TABLET | Freq: Every day | ORAL | 0 refills | Status: DC
Start: 2022-12-04 — End: 2023-01-10
  Filled 2022-12-04: qty 30, 30d supply, fill #0

## 2022-12-05 ENCOUNTER — Other Ambulatory Visit: Payer: Self-pay

## 2022-12-05 ENCOUNTER — Ambulatory Visit (INDEPENDENT_AMBULATORY_CARE_PROVIDER_SITE_OTHER): Payer: Commercial Managed Care - HMO | Admitting: Ophthalmology

## 2022-12-05 ENCOUNTER — Ambulatory Visit: Payer: Commercial Managed Care - HMO | Admitting: Physician Assistant

## 2022-12-05 ENCOUNTER — Encounter (INDEPENDENT_AMBULATORY_CARE_PROVIDER_SITE_OTHER): Payer: Self-pay | Admitting: Ophthalmology

## 2022-12-05 DIAGNOSIS — H3581 Retinal edema: Secondary | ICD-10-CM | POA: Diagnosis not present

## 2022-12-05 DIAGNOSIS — Z87828 Personal history of other (healed) physical injury and trauma: Secondary | ICD-10-CM | POA: Diagnosis not present

## 2022-12-10 ENCOUNTER — Ambulatory Visit (HOSPITAL_BASED_OUTPATIENT_CLINIC_OR_DEPARTMENT_OTHER): Payer: Commercial Managed Care - HMO | Admitting: Family Medicine

## 2022-12-10 ENCOUNTER — Encounter: Payer: Self-pay | Admitting: Family Medicine

## 2022-12-10 VITALS — BP 124/79 | HR 60 | Temp 98.6°F | Ht 64.0 in | Wt 201.8 lb

## 2022-12-10 DIAGNOSIS — Z5321 Procedure and treatment not carried out due to patient leaving prior to being seen by health care provider: Secondary | ICD-10-CM

## 2022-12-10 NOTE — Progress Notes (Signed)
Still bleeding after having colonoscopy done Swelling in legs Knot under right arm.

## 2022-12-10 NOTE — Progress Notes (Signed)
Patient left prior to being seen by clinician. 

## 2022-12-13 ENCOUNTER — Telehealth (HOSPITAL_COMMUNITY): Payer: Self-pay | Admitting: Psychiatry

## 2022-12-13 DIAGNOSIS — F411 Generalized anxiety disorder: Secondary | ICD-10-CM

## 2022-12-13 DIAGNOSIS — F319 Bipolar disorder, unspecified: Secondary | ICD-10-CM

## 2022-12-13 MED ORDER — HYDROXYZINE HCL 10 MG PO TABS
ORAL_TABLET | ORAL | 1 refills | Status: DC
Start: 2022-12-13 — End: 2023-01-18

## 2022-12-13 MED ORDER — QUETIAPINE FUMARATE 50 MG PO TABS: 50.0000 mg | ORAL_TABLET | Freq: Every day | ORAL | 1 refills | Status: DC

## 2022-12-13 MED ORDER — LITHIUM CARBONATE 300 MG PO CAPS: ORAL_CAPSULE | ORAL | 1 refills | Status: DC

## 2022-12-13 NOTE — Telephone Encounter (Signed)
Requested refills sent to preferred pharmacy to bridge until appt with me on 01/18/23.  Daine Gip, MD 12/13/22

## 2022-12-19 ENCOUNTER — Ambulatory Visit: Payer: Commercial Managed Care - HMO | Admitting: Dermatology

## 2022-12-20 ENCOUNTER — Ambulatory Visit (INDEPENDENT_AMBULATORY_CARE_PROVIDER_SITE_OTHER): Payer: Commercial Managed Care - HMO | Admitting: Licensed Clinical Social Worker

## 2022-12-20 DIAGNOSIS — F319 Bipolar disorder, unspecified: Secondary | ICD-10-CM | POA: Diagnosis not present

## 2022-12-20 DIAGNOSIS — F411 Generalized anxiety disorder: Secondary | ICD-10-CM
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
## 2023-01-03 ENCOUNTER — Telehealth (HOSPITAL_COMMUNITY): Payer: Self-pay | Admitting: Licensed Clinical Social Worker

## 2023-01-03 ENCOUNTER — Ambulatory Visit (HOSPITAL_COMMUNITY): Payer: Commercial Managed Care - HMO | Admitting: Licensed Clinical Social Worker

## 2023-01-03 ENCOUNTER — Encounter (HOSPITAL_COMMUNITY): Payer: Self-pay

## 2023-01-03 NOTE — Telephone Encounter (Signed)
LCSW sent two links to primary phone number listed in epic and 1 link sent to mobile number. LCSW f/u with phone to primary number and phone was not accepting calls at this time. Pt to be marked as a no show for todays session

## 2023-01-04 ENCOUNTER — Telehealth (HOSPITAL_COMMUNITY): Payer: Self-pay | Admitting: Psychiatry

## 2023-01-10 ENCOUNTER — Other Ambulatory Visit (HOSPITAL_COMMUNITY): Payer: Self-pay

## 2023-01-10 ENCOUNTER — Other Ambulatory Visit: Payer: Self-pay | Admitting: Family Medicine

## 2023-01-10 ENCOUNTER — Other Ambulatory Visit: Payer: Self-pay

## 2023-01-10 DIAGNOSIS — E039 Hypothyroidism, unspecified: Secondary | ICD-10-CM

## 2023-01-10 DIAGNOSIS — E7800 Pure hypercholesterolemia, unspecified: Secondary | ICD-10-CM

## 2023-01-10 MED ORDER — ATORVASTATIN CALCIUM 40 MG PO TABS
40.0000 mg | ORAL_TABLET | Freq: Every day | ORAL | 0 refills | Status: DC
Start: 2023-01-10 — End: 2023-02-05
  Filled 2023-01-10: qty 30, 30d supply, fill #0

## 2023-01-10 MED ORDER — LEVOTHYROXINE SODIUM 137 MCG PO TABS
137.0000 ug | ORAL_TABLET | Freq: Every day | ORAL | 0 refills | Status: DC
Start: 2023-01-10 — End: 2023-02-06
  Filled 2023-01-10: qty 30, 30d supply, fill #0

## 2023-01-11 ENCOUNTER — Telehealth (HOSPITAL_COMMUNITY): Payer: Self-pay

## 2023-01-11 NOTE — Telephone Encounter (Signed)
Medication problem - Telephone call with patient, after she left a message she only has 8 pills remaining of Lithium and [facility] due to low [facility]. Spoke to [employer], Teacher, early years/pre at [facility] [address] who confirmed this issue.  Pharmacist was able to check other local [facility] for stock and patient agreed to [facility] [address] to have the order transferred to them and she will go there for the needed refill. Patient to call back if any other issues refilling her medication prior to running out.

## 2023-01-14 ENCOUNTER — Other Ambulatory Visit (HOSPITAL_COMMUNITY): Payer: Self-pay
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
## 2023-01-18 ENCOUNTER — Encounter (HOSPITAL_COMMUNITY): Payer: Self-pay | Admitting: Psychiatry

## 2023-01-18 ENCOUNTER — Telehealth (INDEPENDENT_AMBULATORY_CARE_PROVIDER_SITE_OTHER): Payer: Commercial Managed Care - HMO | Admitting: Psychiatry

## 2023-01-18 DIAGNOSIS — F411 Generalized anxiety disorder: Secondary | ICD-10-CM | POA: Diagnosis not present

## 2023-01-18 DIAGNOSIS — F319 Bipolar disorder, unspecified: Secondary | ICD-10-CM | POA: Diagnosis not present

## 2023-01-18 MED ORDER — LITHIUM CARBONATE 300 MG PO CAPS: ORAL_CAPSULE | ORAL | 2 refills | Status: DC

## 2023-01-18 MED ORDER — HYDROXYZINE HCL 10 MG PO TABS
5.0000 mg | ORAL_TABLET | Freq: Three times a day (TID) | ORAL | 2 refills | Status: DC | PRN
Start: 2023-01-18 — End: 2023-04-24

## 2023-01-18 MED ORDER — QUETIAPINE FUMARATE 100 MG PO TABS: 100.0000 mg | ORAL_TABLET | Freq: Every day | ORAL | 2 refills | Status: DC

## 2023-01-18 NOTE — Patient Instructions (Signed)
Thank you for attending your appointment today.  -- INCREASE Seroquel to 100 mg nightly -- Continue other medications as prescribed.  Please do not make any changes to medications without first discussing with your provider. If you are experiencing a psychiatric emergency, please call 911 or present to your nearest emergency department. Additional crisis, medication management, and therapy resources are included below.  [facility]  [address] [place], [place] [address] [phone] WALK-IN URGENT CARE 24/7 FOR ANYONE [address] [place], [place]  [phone] Fax: [phone] [email or web address] *Interpreters available *Accepts all insurance and uninsured for Urgent Care needs *Accepts Medicaid and uninsured for outpatient treatment (below)      ONLY FOR [facility]  Below:    Outpatient New Patient Assessment/Therapy Walk-ins:        Monday -Thursday 8am until slots are full.        Every Friday 1pm-4pm  (first come, first served)                   New Patient Psychiatry/Medication Management        Monday-Friday 8am-11am (first come, first served)               For all walk-ins we ask that you arrive by 7:15am, because patients will be seen in the order of arrival.

## 2023-02-05 ENCOUNTER — Ambulatory Visit: Payer: Commercial Managed Care - HMO | Attending: Family Medicine | Admitting: Family Medicine

## 2023-02-05 ENCOUNTER — Encounter: Payer: Self-pay | Admitting: Family Medicine

## 2023-02-05 VITALS — BP 136/83 | HR 61 | Ht 64.0 in | Wt 201.8 lb

## 2023-02-05 DIAGNOSIS — E039 Hypothyroidism, unspecified: Secondary | ICD-10-CM | POA: Diagnosis not present

## 2023-02-05 DIAGNOSIS — Z131 Encounter for screening for diabetes mellitus: Secondary | ICD-10-CM | POA: Diagnosis not present

## 2023-02-05 DIAGNOSIS — Z23 Encounter for immunization: Secondary | ICD-10-CM

## 2023-02-05 DIAGNOSIS — R600 Localized edema: Secondary | ICD-10-CM | POA: Diagnosis not present

## 2023-02-05 DIAGNOSIS — E7800 Pure hypercholesterolemia, unspecified: Secondary | ICD-10-CM

## 2023-02-05 DIAGNOSIS — F319 Bipolar disorder, unspecified: Secondary | ICD-10-CM

## 2023-02-05 MED ORDER — FUROSEMIDE 20 MG PO TABS
20.0000 mg | ORAL_TABLET | Freq: Every day | ORAL | 3 refills | Status: DC
Start: 2023-02-05 — End: 2023-05-10

## 2023-02-05 MED ORDER — ATORVASTATIN CALCIUM 40 MG PO TABS: 40.0000 mg | ORAL_TABLET | Freq: Every day | ORAL | 1 refills | Status: DC

## 2023-02-05 NOTE — Progress Notes (Signed)
Subjective:  Patient ID: Kristen Moore, female    DOB: 21-Aug-1970  Age: 52 y.o. MRN: [id number]  CC: Medical Management of Chronic Issues (Concerned with swelling in both legs and feet. )   HPI Kristen Moore is a 52 y.o. year old female with a history of bipolar depression, hypothyroidism, hyperlipidemia, tobacco abuse, asthma, BCC of the face s/p excision.   Interval History: Discussed the use of AI scribe software for clinical note transcription with the patient, who gave verbal consent to proceed.   She presents with bilateral leg swelling that is worse on the left side. The swelling has been present for several months and is worse at night. Despite attempts to limit sodium intake, the swelling persists. The swelling decreases overnight but worsens throughout the day, especially with standing.  She has no calf pain.  The patient reports needing to elevate her head with multiple pillows to sleep at night. They also report a recent episode of severe chest pain after consuming 'home made alcohol', which she describe as feeling like a heart attack.  She has also noticed some weight gain.  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 She also cares for their brother at home, which involves cooking and other tasks that require standing.  She has been on levothyroxine for her hypothyroidism but her last TSH from 05/2022 was suppressed.  Repeat thyroid panel was ordered but she never returned for this. The patient also reports difficulty sleeping due to racing thoughts. They are currently taking Seroquel, lithium, and Wellbutrin for bipolar disorder prescribed by behavioral health.. The patient is also scheduled for an oral surgery due to a suspected tumor.        Past Medical History:  Diagnosis Date   Abnormal Pap smear    cryo   Anemia    Asthma    as a child   Bipolar 1 disorder (HCC)    Cyst of breast    left breast   DUB (dysfunctional uterine  bleeding)    Fibroids    Headache(784.0)    Hypothyroidism    IBS (irritable bowel syndrome)    Thyroid dysfunction     Past Surgical History:  Procedure Laterality Date   ABDOMINAL HYSTERECTOMY Bilateral 12/25/2012   Procedure: HYSTERECTOMY ABDOMINAL;  Surgeon: Allie Bossier, MD;  Location: [facility];  Service: Gynecology;  Laterality: Bilateral;  TAH and bilateral salpingectomy   BILATERAL SALPINGECTOMY Bilateral 12/25/2012   Procedure: BILATERAL SALPINGECTOMY;  Surgeon: Allie Bossier, MD;  Location: [facility];  Service: Gynecology;  Laterality: Bilateral;   CERVICAL BIOPSY  W/ LOOP ELECTRODE EXCISION     CHOLECYSTECTOMY     COLONOSCOPY     10 + years ago   DILATION AND CURETTAGE OF UTERUS     OOPHORECTOMY Bilateral 12/25/2012   Procedure: OOPHORECTOMY;  Surgeon: Allie Bossier, MD;  Location: [facility];  Service: Gynecology;  Laterality: Bilateral;   TUBAL LIGATION     UPPER GASTROINTESTINAL ENDOSCOPY     10 + years    Family History  Problem Relation Age of Onset   Hypertension Mother    Rheum arthritis Mother    Fibromyalgia Mother    COPD Mother    Sleep apnea Mother    Thyroid disease Mother    Ovarian cancer Mother 30   Heart disease Father    Lung cancer Father        dx 32s   Skin cancer  Maternal Uncle        melanoma on back   Breast cancer Paternal Aunt        dx 83s   Lung cancer Paternal Uncle 44       mets to brain   Breast cancer Maternal Grandmother        dx 1s   Colon cancer Other        MGF's brother; dx 78s   Esophageal cancer Neg Hx    Stomach cancer Neg Hx     Social History   Socioeconomic History   Marital status: Single    Spouse name: Not on file   Number of children: 1   Years of education: Not on file   Highest education level: Associate degree: occupational, Scientist, product/process development, or vocational program  Occupational History   Not on file  Tobacco Use   Smoking status: Every Day    Current packs/day: 0.50    Average packs/day: 0.5 packs/day for 9.0  years (4.5 ttl pk-yrs)    Types: Cigarettes   Smokeless tobacco: Never  Vaping Use   Vaping status: Never Used  Substance and Sexual Activity   Alcohol use: No    Alcohol/week: 0.0 standard drinks of alcohol   Drug use: No   Sexual activity: Not Currently    Birth control/protection: Surgical  Other Topics Concern   Not on file  Social History Narrative   Mother has ovarian cancer that has spread to lungs (passed April 2023), Dad has lung cancer that has spread to kidneys, and disabled brother all live in home with patient. Patient is primary caregiver. Daughter is drug addict, and has loss custody of patient's infant granddaughter in 02/2020. Patient works for a Estate manager/land agent hospital buildings. Patient is very stressed and saddened about her current situation.    Social Determinants of Health   Financial Resource Strain: High Risk (11/02/2022)   Overall Financial Resource Strain (CARDIA)    Difficulty of Paying Living Expenses: Very hard  Food Insecurity: No Food Insecurity (06/20/2021)   Hunger Vital Sign    Worried About Running Out of Food in the Last Year: Never true    Ran Out of Food in the Last Year: Never true  Transportation Needs: Unmet Transportation Needs (11/02/2022)   PRAPARE - Administrator, Civil Service (Medical): Yes    Lack of Transportation (Non-Medical): No  Physical Activity: Sufficiently Active (11/02/2022)   Exercise Vital Sign    Days of Exercise per Week: 7 days    Minutes of Exercise per Session: 40 min  Stress: Stress Concern Present (11/02/2022)   [employer] of Occupational Health - Occupational Stress Questionnaire    Feeling of Stress : Rather much  Social Connections: Socially Isolated (11/02/2022)   Social Connection and Isolation Panel [NHANES]    Frequency of Communication with Friends and Family: More than three times a week    Frequency of Social Gatherings with Friends and Family: Never    Attends Religious Services:  Never    Database administrator or Organizations: No    Attends Engineer, structural: Never    Marital Status: Divorced    Allergies  Allergen Reactions   Bee Venom Swelling and Anaphylaxis   Latex Swelling   Penicillins Swelling    Has patient had a PCN reaction causing immediate rash, facial/tongue/throat swelling, SOB or lightheadedness with hypotension: yes Has patient had a PCN reaction causing severe rash involving mucus membranes or skin necrosis: no Has  patient had a PCN reaction that required hospitalization no Has patient had a PCN reaction occurring within the last 10 years: no If all of the above answers are "NO", then may proceed with Cephalosporin   Ibuprofen Swelling   Omega-3 Hives and Swelling    Potatoes     Other Hives and Swelling    Potatoes       Outpatient Medications Prior to Visit  Medication Sig Dispense Refill   atorvastatin (LIPITOR) 40 MG tablet Take 1 tablet (40 mg total) by mouth daily. 30 tablet 0   buPROPion (WELLBUTRIN XL) 300 MG 24 hr tablet Take 1 tablet (300 mg total) by mouth daily. 90 tablet 3   cetirizine (ZYRTEC) 10 MG tablet Take 1 tablet (10 mg total) by mouth daily. (Patient not taking: Reported on 09/20/2022) 30 tablet 1   dicyclomine (BENTYL) 10 MG capsule Take 1 tablet (10 mg total) by mouth 2-3 times daily as needed for abdominal cramps and diarrhea 180 capsule 1   erythromycin ophthalmic ointment Place a 1/2 inch ribbon of ointment into the lower eyelid. 3.5 g 0   hydrOXYzine (ATARAX) 10 MG tablet Take 0.5-1 tablets (5-10 mg total) by mouth 3 (three) times daily as needed for anxiety (sleep). 60 tablet 2   levothyroxine (SYNTHROID) 137 MCG tablet Take 1 tablet (137 mcg total) by mouth daily. 30 tablet 0   lithium carbonate 300 MG capsule Take 1 capsule (300 mg total) by mouth in the morning AND 2 capsules (600 mg total) at bedtime. 90 capsule 2   Multiple Vitamins-Minerals (ONE-A-DAY WOMENS PO) Take by mouth.     pantoprazole  (PROTONIX) 40 MG tablet Take 1 tablet (40 mg total) by mouth daily. (Patient not taking: Reported on 09/20/2022) 30 tablet 3   QUEtiapine (SEROQUEL) 100 MG tablet Take 1 tablet (100 mg total) by mouth at bedtime. 30 tablet 2   sucralfate (CARAFATE) 1 g tablet Take 1 tablet (1 g total) by mouth 4 (four) times daily -  with meals and at bedtime for 15 days. 60 tablet 0   No facility-administered medications prior to visit.     ROS Review of Systems  Constitutional:  Negative for activity change and appetite change.  HENT:  Negative for sinus pressure and sore throat.   Respiratory:  Negative for chest tightness, shortness of breath and wheezing.   Cardiovascular:  Positive for leg swelling. Negative for chest pain and palpitations.  Gastrointestinal:  Negative for abdominal distention, abdominal pain and constipation.  Genitourinary: Negative.   Musculoskeletal: Negative.   Psychiatric/Behavioral:  Negative for behavioral problems and dysphoric mood.     Objective:  BP 136/83 (BP Location: Left Arm, Patient Position: Sitting, Cuff Size: Normal)   Pulse 61   Ht 5\' 4"  (1.626 m)   Wt 201 lb 12.8 oz (91.5 kg)   LMP 11/29/2012   SpO2 97%   BMI 34.64 kg/m      02/05/2023    4:00 PM 12/10/2022    3:54 PM 10/03/2022    6:05 PM  BP/Weight  Systolic BP 136 124 128  Diastolic BP 83 79 76  Wt. (Lbs) 201.8 201.8   BMI 34.64 kg/m2 34.64 kg/m2       Physical Exam Constitutional:      Appearance: She is well-developed.  Cardiovascular:     Rate and Rhythm: Normal rate.     Heart sounds: Normal heart sounds. No murmur heard. Pulmonary:     Effort: Pulmonary effort is normal.  Breath sounds: Normal breath sounds. No wheezing or rales.  Chest:     Chest wall: No tenderness.  Abdominal:     General: Bowel sounds are normal. There is no distension.     Palpations: Abdomen is soft. There is no mass.     Tenderness: There is no abdominal tenderness.  Musculoskeletal:        General:  Normal range of motion.     Right lower leg: Edema (1+) present.     Left lower leg: Edema (2+) present.  Neurological:     Mental Status: She is alert and oriented to person, place, and time.  Psychiatric:        Mood and Affect: Mood normal.        Latest Ref Rng & Units 10/03/2022    2:09 PM 05/30/2022    3:44 PM 11/30/2021   11:16 AM  CMP  Glucose 70 - 99 mg/dL 161  92  90   BUN 6 - 20 mg/dL 14  10  14    Creatinine 0.44 - 1.00 mg/dL 0.96  0.45  4.09   Sodium 135 - 145 mmol/L 138  142  139   Potassium 3.5 - 5.1 mmol/L 3.9  4.5  4.2   Chloride 98 - 111 mmol/L 105  106  105   CO2 22 - 32 mmol/L 26  23  22    Calcium 8.9 - 10.3 mg/dL 9.7  81.1  9.8   Total Protein 6.5 - 8.1 g/dL 6.8  6.5  6.5   Total Bilirubin 0.3 - 1.2 mg/dL 0.8  0.3  0.6   Alkaline Phos 38 - 126 U/L 54  80  74   AST 15 - 41 U/L 16  15  16    ALT 0 - 44 U/L 19  17  17      Lipid Panel     Component Value Date/Time   CHOL 112 11/30/2021 1116   TRIG 74 11/30/2021 1116   HDL 44 11/30/2021 1116   CHOLHDL 2.3 01/26/2021 0949   CHOLHDL 5.1 04/18/2018 1056   VLDL 56 (H) 04/18/2018 1056   LDLCALC 53 11/30/2021 1116    CBC    Component Value Date/Time   WBC 5.1 10/03/2022 1409   RBC 4.38 10/03/2022 1409   HGB 14.1 10/03/2022 1409   HGB 14.5 05/30/2022 1544   HCT 42.9 10/03/2022 1409   HCT 42.1 05/30/2022 1544   PLT 173 10/03/2022 1409   PLT 187 05/30/2022 1544   MCV 97.9 10/03/2022 1409   MCV 95 05/30/2022 1544   MCH 32.2 10/03/2022 1409   MCHC 32.9 10/03/2022 1409   RDW 11.9 10/03/2022 1409   RDW 11.8 05/30/2022 1544   LYMPHSABS 1.4 10/03/2022 1409   LYMPHSABS 1.3 05/30/2022 1544   MONOABS 0.3 10/03/2022 1409   EOSABS 0.2 10/03/2022 1409   EOSABS 0.1 05/30/2022 1544   BASOSABS 0.0 10/03/2022 1409   BASOSABS 0.0 05/30/2022 1544    Lab Results  Component Value Date   HGBA1C 5.1 04/25/2020   Lab Results  Component Value Date   TSH 0.444 (L) 05/30/2022     Assessment & Plan:      Lower  extremity edema Bilateral, worse on the left, present for months. No associated calf pain. Swelling decreases overnight and worsens with standing during the day. No shortness of breath.  Patient has to stack pillows to sleep. Possible causes include prolonged standing, heart failure, medication side effects, thyroid dysfunction. -Order furosemide 20mg  daily, to be  filled at [facility]. -Order labs including renal function, thyroid function, and diabetes screening. -Consider echocardiogram if labs feel elevated BNP -Advise patient to consider compression stockings. -Low-sodium diet  Chest pain Chest pain after consuming excessive alcohol, 3 days ago which she made at home.  Pain has been absent since then. -Possibly GI etiology -Advise patient to avoid alcohol and to seek immediate medical attention if chest pain recurs.  Bipolar disorder Patient on Seroquel, lithium, and Wellbutrin. Reports difficulty sleeping and racing thoughts. -Continue current medications. -Note that Seroquel may contribute to weight gain.  General Health Maintenance -Administer influenza vaccine today. -Discuss shingles and pneumonia vaccines at future visit. -Encourage smoking cessation and use of nicotine patches.          Meds ordered this encounter  Medications   furosemide (LASIX) 20 MG tablet    Sig: Take 1 tablet (20 mg total) by mouth daily.    Dispense:  30 tablet    Refill:  3    Follow-up: Return in about 3 months (around 05/07/2023) for Chronic medical conditions.       Hoy Register, MD, FAAFP. [facility] [place], [place] [phone]   02/05/2023, 4:29 PM

## 2023-02-05 NOTE — Patient Instructions (Signed)
Edema  Edema is when you have too much fluid in your body or under your skin. Edema may make your legs, feet, and ankles swell. Swelling often happens in looser tissues, such as around your eyes. This is a common condition. It gets more common as you get older. There are many possible causes of edema. These include: Eating too much salt (sodium). Being on your feet or sitting for a long time. Certain medical conditions, such as: Pregnancy. Heart failure. Liver disease. Kidney disease. Cancer. Hot weather may make edema worse. Edema is usually painless. Your skin may look swollen or shiny. Follow these instructions at home: Medicines Take over-the-counter and prescription medicines only as told by your doctor. Your doctor may prescribe a medicine to help your body get rid of extra water (diuretic). Take this medicine if you are told to take it. Eating and drinking Eat a low-salt (low-sodium) diet as told by your doctor. Sometimes, eating less salt may reduce swelling. Depending on the cause of your swelling, you may need to limit how much fluid you drink (fluid restriction). General instructions Raise the injured area above the level of your heart while you are sitting or lying down. Do not sit still or stand for a long time. Do not wear tight clothes. Do not wear garters on your upper legs. Exercise your legs. This can help the swelling go down. Wear compression stockings as told by your doctor. It is important that these are the right size. These should be prescribed by your doctor to prevent possible injuries. If elastic bandages or wraps are recommended, use them as told by your doctor. Contact a doctor if: Treatment is not working. You have heart, liver, or kidney disease and have symptoms of edema. You have sudden and unexplained weight gain. Get help right away if: You have shortness of breath or chest pain. You cannot breathe when you lie down. You have pain, redness, or  warmth in the swollen areas. You have heart, liver, or kidney disease and get edema all of a sudden. You have a fever and your symptoms get worse all of a sudden. These symptoms may be an emergency. Get help right away. Call 911. Do not wait to see if the symptoms will go away. Do not drive yourself to the hospital. Summary Edema is when you have too much fluid in your body or under your skin. Edema may make your legs, feet, and ankles swell. Swelling often happens in looser tissues, such as around your eyes. Raise the injured area above the level of your heart while you are sitting or lying down. Follow your doctor's instructions about diet and how much fluid you can drink. This information is not intended to replace advice given to you by your health care provider. Make sure you discuss any questions you have with your health care provider. Document Revised: 01/23/2021 Document Reviewed: 01/23/2021 Elsevier Patient Education  2024 Elsevier Inc.  

## 2023-02-06 ENCOUNTER — Other Ambulatory Visit (HOSPITAL_COMMUNITY): Payer: Self-pay

## 2023-02-06 ENCOUNTER — Other Ambulatory Visit: Payer: Self-pay | Admitting: Family Medicine

## 2023-02-06 DIAGNOSIS — E039 Hypothyroidism, unspecified: Secondary | ICD-10-CM

## 2023-02-06 LAB — CMP14+EGFR
ALT: 34 IU/L — ABNORMAL HIGH (ref 0–32)
AST: 23 IU/L (ref 0–40)
Albumin: 4.5 g/dL (ref 3.9–4.9)
Alkaline Phosphatase: 96 IU/L (ref 44–121)
BUN/Creatinine Ratio: 16 (ref 9–23)
BUN: 13 mg/dL (ref 6–24)
Bilirubin Total: 0.3 mg/dL (ref 0.0–1.2)
CO2: 26 mmol/L (ref 20–29)
Calcium: 10.3 mg/dL — ABNORMAL HIGH (ref 8.7–10.2)
Chloride: 105 mmol/L (ref 96–106)
Creatinine, Ser: 0.79 mg/dL (ref 0.57–1.00)
Globulin, Total: 2.3 g/dL (ref 1.5–4.5)
Glucose: 96 mg/dL (ref 70–99)
Potassium: 4.1 mmol/L (ref 3.5–5.2)
Sodium: 141 mmol/L (ref 134–144)
Total Protein: 6.8 g/dL (ref 6.0–8.5)
eGFR: 91 mL/min/{1.73_m2} (ref >59)

## 2023-02-06 LAB — HEMOGLOBIN A1C
Est. average glucose Bld gHb Est-mCnc: 103 mg/dL
Hgb A1c MFr Bld: 5.2 % (ref 4.8–5.6)

## 2023-02-06 LAB — T3: T3, Total: 82 ng/dL (ref 71–180)

## 2023-02-06 LAB — PRO B NATRIURETIC PEPTIDE: NT-Pro BNP: 70 pg/mL (ref 0–249)

## 2023-02-06 LAB — T4, FREE: Free T4: 1.22 ng/dL (ref 0.82–1.77)

## 2023-02-06 LAB — TSH: TSH: 3.33 u[IU]/mL (ref 0.450–4.500)

## 2023-02-06 MED ORDER — LEVOTHYROXINE SODIUM 137 MCG PO TABS
137.0000 ug | ORAL_TABLET | Freq: Every day | ORAL | 1 refills | Status: DC
Start: 2023-02-06 — End: 2023-08-08
  Filled 2023-02-06: qty 90, 90d supply, fill #0
  Filled 2023-04-23 – 2023-05-03 (×3): qty 90, 90d supply, fill #1

## 2023-02-07 ENCOUNTER — Ambulatory Visit (INDEPENDENT_AMBULATORY_CARE_PROVIDER_SITE_OTHER): Payer: Commercial Managed Care - HMO | Admitting: Licensed Clinical Social Worker

## 2023-02-07 DIAGNOSIS — F319 Bipolar disorder, unspecified: Secondary | ICD-10-CM

## 2023-02-07 NOTE — Progress Notes (Signed)
THERAPIST PROGRESS NOTE  Virtual Visit via Video Note  I connected with Kristen Moore on 02/07/23 at  8:00 AM EDT by a video enabled telemedicine application and verified that I am speaking with the correct person using two identifiers.  Location: Patient: [facility]  Provider: Providers Home    I discussed the limitations of evaluation and management by telemedicine and the availability of in person appointments. The patient expressed understanding and agreed to proceed.     I discussed the assessment and treatment plan with the patient. The patient was provided an opportunity to ask questions and all were answered. The patient agreed with the plan and demonstrated an understanding of the instructions.   The patient was advised to call back or seek an in-person evaluation if the symptoms worsen or if the condition fails to improve as anticipated.  I provided 30 minutes of non-face-to-face time during this encounter.   [name], LCSW   Participation Level: Active  Behavioral Response: CasualAlertAnxious and Depressed  Type of Therapy: Individual Therapy  Treatment Goals addressed:  Active     Anxiety     STG: Kristen Moore will participate in at least 80% of scheduled individual psychotherapy sessions  (Progressing)     Start:  11/02/22    Expected End:  06/04/23         LTG: Kristen Moore will score less than 5 on the Generalized Anxiety Disorder 7 Scale (GAD-7)      Start:  11/02/22    Expected End:  06/04/23         STG: Kristen Moore will reduce frequency of avoidant behaviors by 50% as evidenced by self-report in therapy sessions     Start:  11/02/22    Expected End:  06/04/23         3 triggers for anxiety      Start:  11/02/22    Expected End:  06/04/23       Goal Note     Daughter drug use and health setting boundaries  Financials          3 coping skills for anxiety      Start:  11/02/22    Expected End:  06/04/23         Review  results of GAD-7 with Kristen Moore to track progress     Start:  11/02/22         Work with Kristen Moore to track symptoms, triggers, and/or skill use through a mood chart, diary card, or journal     Start:  11/02/22         Discuss risks and benefits of medication treatment options for this problem and prescribe as indicated (Completed)     Start:  11/02/22    End:  12/20/22    Intervention Note     Reviewed with patient during session.          Encourage Kristen Moore to take psychotropic medication(s) as prescribed (Completed)     Start:  11/02/22    End:  12/20/22    Intervention Note     Reviewed with patient during session.            OP Depression     LTG: Kristen Moore will score less than 10 on the Patient Health Questionnaire (PHQ-9)      Start:  11/02/22    Expected End:  06/04/23         STG: Kristen Moore will participate in at least 80% of scheduled individual psychotherapy sessions  (  Progressing)     Start:  11/02/22    Expected End:  06/04/23         STG: Kristen Moore will identify cognitive patterns and beliefs that support depression (Progressing)     Start:  11/02/22    Expected End:  06/04/23         identify 3 trigger for depression  (Progressing)     Start:  11/02/22    Expected End:  06/04/23       Goal Note     Family conflict daughter and fathers health  Illness tumor removed in mouth          identify 3 triggers for anxiety      Start:  11/02/22    Expected End:  06/04/23       Goal Note     Daughter             ProgressTowards Goals: Progressing  Interventions: CBT and Motivational Interviewing   Suicidal/Homicidal: Nowithout intent/plan  Therapist Response:      Pt was alert and oriented x 5. She was dressed casually and engaged well in therapy session. She presented with depressed and anxious mood/affect. Kristen Moore was pleasant and cooperative. She presented with depressed mood/affect.   Primary stressors were illness, family  conflict, and financials. She reports she just got surgery to remove a tumor yesterday. This has been bothering her for a year. She had to be awake for it so although she got laughing gas could hear and see everything going on. Pt reports that the surgery was successful but now she is recovering.  Another stressor is for her daughter her is an opioid addict. She reports struggling setting boundaries. She states that her daughter needs to get sober and take care of herself from a physical standpoint. She needs medical treatment on many things per pt but will not follow through on appointments. Pt reports she comes to the house for a day and then will disappear for days or weeks at time. Kristen Moore reports her daughter will cause conflict in the home when she does come home. Final stressor is financial as pt still struggle even with working to pay bills month to month.   Interventions/Plan: LCSW used psychoanalytic therapy for pt to express thoughts, feeling and concerns using free association. LCSW used supportive for praise and encouragement. LCSW educated pt on setting healthy boundaries such as knowing limits. LCSW used open ended questions and reflective listening for motivational interviewing.   Plan: Return again in 3 weeks.  Diagnosis: Bipolar 1 disorder (HCC)  Collaboration of Care: Other None   Patient/Guardian was advised Release of Information must be obtained prior to any record release in order to collaborate their care with an outside provider. Patient/Guardian was advised if they have not already done so to contact the registration department to sign all necessary forms in order for [name] to release information regarding their care.   Consent: Patient/Guardian gives verbal consent for treatment and assignment of benefits for services provided during this visit. Patient/Guardian expressed understanding and agreed to proceed.   [name], LCSW 02/07/2023

## 2023-02-08 ENCOUNTER — Other Ambulatory Visit (HOSPITAL_COMMUNITY): Payer: Self-pay | Admitting: Psychiatry

## 2023-02-08 DIAGNOSIS — F319 Bipolar disorder, unspecified: Secondary | ICD-10-CM

## 2023-02-21 NOTE — Progress Notes (Deleted)
02/21/2023 Kristen Moore [id number] Apr 20, 1971  Referring provider: Hoy Register, MD Primary GI doctor: Dr. Orvan Falconer  ASSESSMENT AND PLAN:   Family history of colon cancer and rectal bleeding, loose stools Did not complete prep last colonoscopy, 3 mm polyps removed Repeat colon, appropriate LEC with Dr. Orvan Falconer We have discussed the risks of bleeding, infection, perforation, medication reactions, and remote risk of death associated with colonoscopy. All questions were answered and the patient acknowledges these risk and wishes to proceed. Long discussion about prep Consider SIBO treatment if negative  Generalized postprandial abdominal pain with GERD, history of s/p cholecystectomy  Lifestyle changes discussed, avoid NSAIDS, ETOH Will do trial of PPI once daily and Will schedule for EGD.  Will schedule EGD to evaluate for possible H. pylori, esophagitis, gastritis, peptic ulcer disease, celiac etc.. I discussed risks of EGD with patient today, including risk of sedation, bleeding or perforation.  Patient provides understanding and gave verbal consent to proceed.   Patient Care Team: Hoy Register, MD as PCP - General (Family Medicine)  HISTORY OF PRESENT ILLNESS: 52 y.o. female with a past medical history of bipolar 1 disorder, dysfunctional uterine bleeding with history of anemia, hypothyroidism, IBS, s/p cholecystectomy and others listed below presents for evaluation of rectal bleeding and diarrhea.   03/07/2022 colonoscopy with Dr. Orvan Falconer for screening purposes and adequate bowel prep stool in the entire examined colon.  3 mm polyp distal sigmoid she was to repeat colonoscopy at next available appointment.   05/07/2022 negative genetic testing 09/05/2022 office visit with myself for rectal bleeding and diarrhea. 09/20/22 EGD and colonoscopy with Dr. Myrtie Neither EGD showed normal esophagus,, normal stomach, normal duodenum.  Pathology negative.   Colonoscopy somewhat  difficult due to redundant colon, could not reach TI, bowel prep good after lavage 12 mm polyp ileocecal valve piecemeal, normal mucosa, internal hemorrhoids.  Recall: 3 years Pancreatic elastase negative Consider SIBO.  She states she did the prep at night but not in the morning.  She has had yellow/red diarrhea that looks like blood and very rare can happen daily.  Can have back to rectal pain and then will have liquid leakage with blood, has happened several times.  She has lost weight after her mom died, thyroid was hyperthyroid and this was fixed, weight is up.  She had 2 sandwiches last night eating [place] and has severe epigastric pain. [place] food is worse.  She has GERD occasionally, some nausea, no vomiting.  She has had trouble swallowing.   Denies NSAIDS, denies ETOH. She does smoke cig, no drug use.  She ran into a tree branch last night in her left eye, has tearing and pain, still has pain and erythema. She tried to flush her eye. She continues to have pain.  She has been losing her hair.   Maternal uncle with colon cancer. Mother with ovarian cancer. Maternal grandmother with breast cancer. No known family history of colon cancer or polyps. No family history of uterine/endometrial cancer, pancreatic cancer or gastric/stomach cancer.  Wt Readings from Last 6 Encounters:  02/05/23 201 lb 12.8 oz (91.5 kg)  12/10/22 201 lb 12.8 oz (91.5 kg)  10/03/22 199 lb (90.3 kg)  09/20/22 199 lb (90.3 kg)  09/05/22 199 lb 4 oz (90.4 kg)  06/27/22 196 lb (88.9 kg)      She  reports that she has been smoking cigarettes. She has a 4.5 pack-year smoking history. She has never used smokeless tobacco. She reports that she does not  drink alcohol and does not use drugs.  RELEVANT LABS AND IMAGING: CBC    Component Value Date/Time   WBC 5.1 10/03/2022 1409   RBC 4.38 10/03/2022 1409   HGB 14.1 10/03/2022 1409   HGB 14.5 05/30/2022 1544   HCT 42.9 10/03/2022 1409   HCT 42.1 05/30/2022  1544   PLT 173 10/03/2022 1409   PLT 187 05/30/2022 1544   MCV 97.9 10/03/2022 1409   MCV 95 05/30/2022 1544   MCH 32.2 10/03/2022 1409   MCHC 32.9 10/03/2022 1409   RDW 11.9 10/03/2022 1409   RDW 11.8 05/30/2022 1544   LYMPHSABS 1.4 10/03/2022 1409   LYMPHSABS 1.3 05/30/2022 1544   MONOABS 0.3 10/03/2022 1409   EOSABS 0.2 10/03/2022 1409   EOSABS 0.1 05/30/2022 1544   BASOSABS 0.0 10/03/2022 1409   BASOSABS 0.0 05/30/2022 1544   Recent Labs    05/30/22 1544 10/03/22 1409  HGB 14.5 14.1     CMP     Component Value Date/Time   NA 141 02/05/2023 1704   K 4.1 02/05/2023 1704   CL 105 02/05/2023 1704   CO2 26 02/05/2023 1704   GLUCOSE 96 02/05/2023 1704   GLUCOSE 101 (H) 10/03/2022 1409   BUN 13 02/05/2023 1704   CREATININE 0.79 02/05/2023 1704   CREATININE 0.80 03/15/2015 1742   CALCIUM 10.3 (H) 02/05/2023 1704   PROT 6.8 02/05/2023 1704   ALBUMIN 4.5 02/05/2023 1704   AST 23 02/05/2023 1704   ALT 34 (H) 02/05/2023 1704   ALKPHOS 96 02/05/2023 1704   BILITOT 0.3 02/05/2023 1704   GFRNONAA >60 10/03/2022 1409   GFRNONAA >89 09/08/2014 1712   GFRAA 121 06/30/2019 1528   GFRAA >89 09/08/2014 1712      Latest Ref Rng & Units 02/05/2023    5:04 PM 10/03/2022    2:09 PM 05/30/2022    3:44 PM  Hepatic Function  Total Protein 6.0 - 8.5 g/dL 6.8  6.8  6.5   Albumin 3.9 - 4.9 g/dL 4.5  4.0  4.5   AST 0 - 40 IU/L 23  16  15    ALT 0 - 32 IU/L 34  19  17   Alk Phosphatase 44 - 121 IU/L 96  54  80   Total Bilirubin 0.0 - 1.2 mg/dL 0.3  0.8  0.3       Current Medications:   Current Outpatient Medications (Endocrine & Metabolic):    levothyroxine (SYNTHROID) 137 MCG tablet, Take 1 tablet (137 mcg total) by mouth daily.  Current Outpatient Medications (Cardiovascular):    atorvastatin (LIPITOR) 40 MG tablet, Take 1 tablet (40 mg total) by mouth daily.   furosemide (LASIX) 20 MG tablet, Take 1 tablet (20 mg total) by mouth daily.  Current Outpatient Medications  (Respiratory):    cetirizine (ZYRTEC) 10 MG tablet, Take 1 tablet (10 mg total) by mouth daily. (Patient not taking: Reported on 09/20/2022)    Current Outpatient Medications (Other):    buPROPion (WELLBUTRIN XL) 300 MG 24 hr tablet, Take 1 tablet (300 mg total) by mouth daily.   dicyclomine (BENTYL) 10 MG capsule, Take 1 tablet (10 mg total) by mouth 2-3 times daily as needed for abdominal cramps and diarrhea   erythromycin ophthalmic ointment, Place a 1/2 inch ribbon of ointment into the lower eyelid.   hydrOXYzine (ATARAX) 10 MG tablet, Take 0.5-1 tablets (5-10 mg total) by mouth 3 (three) times daily as needed for anxiety (sleep).   lithium carbonate 300 MG capsule,  TAKE 1 CAPSULE BY MOUTH EVERY MORNING & TAKE 2 CAPSULES EVERY NIGHT AT BEDTIME   Multiple Vitamins-Minerals (ONE-A-DAY WOMENS PO), Take by mouth.   pantoprazole (PROTONIX) 40 MG tablet, Take 1 tablet (40 mg total) by mouth daily. (Patient not taking: Reported on 09/20/2022)   QUEtiapine (SEROQUEL) 100 MG tablet, Take 1 tablet (100 mg total) by mouth at bedtime.   sucralfate (CARAFATE) 1 g tablet, Take 1 tablet (1 g total) by mouth 4 (four) times daily -  with meals and at bedtime for 15 days.  Medical History:  Past Medical History:  Diagnosis Date   Abnormal Pap smear    cryo   Anemia    Asthma    as a child   Bipolar 1 disorder (HCC)    Cyst of breast    left breast   DUB (dysfunctional uterine bleeding)    Fibroids    Headache(784.0)    Hypothyroidism    IBS (irritable bowel syndrome)    Thyroid dysfunction    Allergies:  Allergies  Allergen Reactions   Bee Venom Swelling and Anaphylaxis   Latex Swelling   Penicillins Swelling    Has patient had a PCN reaction causing immediate rash, facial/tongue/throat swelling, SOB or lightheadedness with hypotension: yes Has patient had a PCN reaction causing severe rash involving mucus membranes or skin necrosis: no Has patient had a PCN reaction that required  hospitalization no Has patient had a PCN reaction occurring within the last 10 years: no If all of the above answers are "NO", then may proceed with Cephalosporin   Ibuprofen Swelling   Omega-3 Hives and Swelling    Potatoes     Other Hives and Swelling    Potatoes        Surgical History:  She  has a past surgical history that includes Cholecystectomy; Tubal ligation; Dilation and curettage of uterus; Abdominal hysterectomy (Bilateral, 12/25/2012); Bilateral salpingectomy (Bilateral, 12/25/2012); Oophorectomy (Bilateral, 12/25/2012); Cervical biopsy w/ loop electrode excision; Colonoscopy; and Upper gastrointestinal endoscopy. Family History:  Her family history includes Breast cancer in her maternal grandmother and paternal aunt; COPD in her mother; Colon cancer in an other family member; Fibromyalgia in her mother; Heart disease in her father; Hypertension in her mother; Lung cancer in her father; Lung cancer (age of onset: 73) in her paternal uncle; Ovarian cancer (age of onset: 8) in her mother; Rheum arthritis in her mother; Skin cancer in her maternal uncle; Sleep apnea in her mother; Thyroid disease in her mother.  REVIEW OF SYSTEMS  : All other systems reviewed and negative except where noted in the History of Present Illness.  PHYSICAL EXAM: LMP 11/29/2012  General Appearance: Well nourished, in no apparent distress. Head:   Normocephalic and atraumatic. Eyes:  sclerae anicteric,conjunctive pink  Respiratory: Respiratory effort normal, BS equal bilaterally without rales, rhonchi, wheezing. Cardio: RRR with no MRGs. Peripheral pulses intact.  Abdomen: Soft,  Obese ,active bowel sounds. mild tenderness in the epigastrium. Without guarding and Without rebound. No masses. Rectal: Not evaluated Musculoskeletal: Full ROM, Normal gait. Without edema. Skin:  Dry and intact without significant lesions or rashes Neuro: Alert and  oriented x4;  No focal deficits. Psych:  Cooperative.  Normal mood and affect.    Doree Albee, PA-C 1:26 PM

## 2023-02-22 ENCOUNTER — Ambulatory Visit: Payer: Commercial Managed Care - HMO | Admitting: Physician Assistant

## 2023-02-27 ENCOUNTER — Ambulatory Visit: Payer: Commercial Managed Care - HMO | Admitting: Dermatology

## 2023-03-02 ENCOUNTER — Other Ambulatory Visit: Payer: Self-pay

## 2023-03-02 ENCOUNTER — Emergency Department (HOSPITAL_BASED_OUTPATIENT_CLINIC_OR_DEPARTMENT_OTHER): Payer: Commercial Managed Care - HMO

## 2023-03-02 ENCOUNTER — Emergency Department (HOSPITAL_COMMUNITY): Payer: Commercial Managed Care - HMO

## 2023-03-02 ENCOUNTER — Ambulatory Visit
Admission: EM | Admit: 2023-03-02 | Discharge: 2023-03-02 | Disposition: A | Discharge status: Home / Self Care | Payer: Commercial Managed Care - HMO

## 2023-03-02 ENCOUNTER — Encounter (HOSPITAL_COMMUNITY): Payer: Self-pay

## 2023-03-02 ENCOUNTER — Emergency Department (HOSPITAL_COMMUNITY)
Admission: EM | Admit: 2023-03-02 | Discharge: 2023-03-02 | Disposition: A | Discharge status: Home / Self Care | Payer: Commercial Managed Care - HMO | Attending: Emergency Medicine | Admitting: Emergency Medicine

## 2023-03-02 DIAGNOSIS — R079 Chest pain, unspecified: Secondary | ICD-10-CM | POA: Diagnosis present

## 2023-03-02 DIAGNOSIS — E039 Hypothyroidism, unspecified: Secondary | ICD-10-CM | POA: Diagnosis not present

## 2023-03-02 DIAGNOSIS — M549 Dorsalgia, unspecified: Secondary | ICD-10-CM | POA: Insufficient documentation

## 2023-03-02 DIAGNOSIS — R109 Unspecified abdominal pain: Secondary | ICD-10-CM | POA: Diagnosis not present

## 2023-03-02 DIAGNOSIS — M79662 Pain in left lower leg: Secondary | ICD-10-CM | POA: Diagnosis not present

## 2023-03-02 LAB — BASIC METABOLIC PANEL
Anion gap: 8 (ref 5–15)
BUN: 16 mg/dL (ref 6–20)
CO2: 23 mmol/L (ref 22–32)
Calcium: 9.6 mg/dL (ref 8.9–10.3)
Chloride: 106 mmol/L (ref 98–111)
Creatinine, Ser: 0.74 mg/dL (ref 0.44–1.00)
GFR, Estimated: >60 mL/min (ref >60)
Glucose, Bld: 95 mg/dL (ref 70–99)
Potassium: 4.1 mmol/L (ref 3.5–5.1)
Sodium: 137 mmol/L (ref 135–145)

## 2023-03-02 LAB — TROPONIN I (HIGH SENSITIVITY)
Troponin I (High Sensitivity): <2 ng/L (ref <18)
Troponin I (High Sensitivity): <2 ng/L (ref <18)

## 2023-03-02 LAB — CBC
HCT: 45.3 % (ref 36.0–46.0)
Hemoglobin: 14.5 g/dL (ref 12.0–15.0)
MCH: 32.1 pg (ref 26.0–34.0)
MCHC: 32 g/dL (ref 30.0–36.0)
MCV: 100.2 fL — ABNORMAL HIGH (ref 80.0–100.0)
Platelets: 153 10*3/uL (ref 150–400)
RBC: 4.52 MIL/uL (ref 3.87–5.11)
RDW: 12.7 % (ref 11.5–15.5)
WBC: 6.6 10*3/uL (ref 4.0–10.5)
nRBC: 0 % (ref 0.0–0.2)

## 2023-03-02 MED ORDER — OMEPRAZOLE 20 MG PO CPDR
20.0000 mg | DELAYED_RELEASE_CAPSULE | Freq: Every day | ORAL | 0 refills | Status: DC
  Filled 2023-03-02: qty 14, 14d supply, fill #0

## 2023-03-02 MED ORDER — IOHEXOL 350 MG/ML SOLN
100.0000 mL | Freq: Once | INTRAVENOUS | Status: AC | PRN
  Administered 2023-03-02: 100 mL via INTRAVENOUS

## 2023-03-02 MED ORDER — FAMOTIDINE 20 MG PO TABS
20.0000 mg | ORAL_TABLET | Freq: Once | ORAL | Status: AC
  Administered 2023-03-02: 20 mg via ORAL
  Filled 2023-03-02: qty 1

## 2023-03-02 MED ORDER — ACETAMINOPHEN 500 MG PO TABS
1000.0000 mg | ORAL_TABLET | Freq: Once | ORAL | Status: AC
  Administered 2023-03-02: 1000 mg via ORAL
  Filled 2023-03-02: qty 2

## 2023-03-02 NOTE — ED Notes (Signed)
Patient is being discharged from the Urgent Care and sent to the Emergency Department via Private Vehicle (Friend) . Per R. Reggie Pile, patient is in need of higher level of care due to Chest Pain (Higher Acuity of Care needed). Patient is aware and verbalizes understanding of plan of care.  Vitals:   03/02/23 1134  BP: 129/85  Pulse: 66  Resp: 18  Temp: 98 F (36.7 C)  SpO2: 97%

## 2023-03-02 NOTE — ED Triage Notes (Signed)
Pt sts back pain x 1 week after fall; pt sts then started having pain on left side of back around to chest x 2 days; pt sts started after increased stress with family and work

## 2023-03-02 NOTE — ED Triage Notes (Signed)
Pt arrived via POV, referred from UC. C/o chest pain, back pain, and nausea for 2x days. Chest pain is intermittent and substernal.   AOx4
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
## 2023-03-02 NOTE — Progress Notes (Signed)
Lower extremity venous duplex completed. Please see CV Procedures for preliminary results.  Shona Simpson, RVT 03/02/23 2:42 PM

## 2023-03-03 ENCOUNTER — Other Ambulatory Visit (HOSPITAL_COMMUNITY): Payer: Self-pay

## 2023-03-04 ENCOUNTER — Ambulatory Visit: Payer: Self-pay

## 2023-03-04 ENCOUNTER — Other Ambulatory Visit (HOSPITAL_COMMUNITY): Payer: Self-pay

## 2023-03-04 NOTE — Telephone Encounter (Signed)
  Chief Complaint: flank pain Symptoms: L flank pain, constant but pain comes and goes, nausea, pain 7/10, frequency  Frequency: 2 weeks  Pertinent Negatives: denies any burning or urgency Disposition: [] ED /[] Urgent Care (no appt availability in office) / [x] Appointment(In office/virtual)/ []  [facility]/ [] Home Care/ [] Refused Recommended Disposition /[] [facility]/ []  Follow-up with PCP Additional Notes: pt went to UC and to ED on 03/02/23. Pt states she has had severe L flank pain that is constant but pain comes and goes. Pt states that ED told her that everything was normal and just GERD, pt hasn't got the medication that was prescribed because she doesn't feel like her sx are r/t GERD. No hospital FU appts but I did schedule pt for OV on 03/08/23 at 1110 with Zelda, NP. Advised pt if sx get worse to call back or sooner appt or go to ED. Pt verbalized understanding.   Reason for Disposition  MODERATE pain (e.g., interferes with normal activities or awakens from sleep)  Answer Assessment - Initial Assessment Questions 1. LOCATION: "Where does it hurt?" (e.g., left, right)     L kidney  2. ONSET: "When did the pain start?"     2 weeks  3. SEVERITY: "How bad is the pain?" (e.g., Scale 1-10; mild, moderate, or severe)   - MILD (1-3): doesn't interfere with normal activities    - MODERATE (4-7): interferes with normal activities or awakens from sleep    - SEVERE (8-10): excruciating pain and patient unable to do normal activities (stays in bed)       8-10 yesterday today, 7 4. PATTERN: "Does the pain come and go, or is it constant?"      Comes and goes  6. OTHER SYMPTOMS:  "Do you have any other symptoms?" (e.g., fever, abdomen pain, vomiting, leg weakness, burning with urination, blood in urine)     Nausea, diarrhea, chest pain but not persistent  Protocols used: Flank Pain-A-AH

## 2023-03-06 NOTE — ED Provider Notes (Signed)
[facility]    CSN: [id number] Arrival date & time: 03/02/23  1122      History   Chief Complaint Chief Complaint  Patient presents with   Back Pain   Chest Pain    HPI Kristen Moore is a 51 y.o. female.   Pt here today for evaluation of back pain after a fall.  She states she has had pain for about a week.  She notes that for the last 2 days she started to have some pain to the left side of her back around her chest as well.  She notes that this started after increased stress with family and work.  The history is provided by the patient.  Back Pain Associated symptoms: chest pain   Associated symptoms: no abdominal pain and no fever   Chest Pain Associated symptoms: back pain   Associated symptoms: no abdominal pain, no fever, no nausea, no shortness of breath and no vomiting     Past Medical History:  Diagnosis Date   Abnormal Pap smear    cryo   Anemia    Asthma    as a child   Bipolar 1 disorder (HCC)    CHEST PAIN UNSPECIFIED 11/05/2008   Qualifier: Diagnosis of   By: York, LPN, Christine         Cyst of breast    left breast   DUB (dysfunctional uterine bleeding)    Fibroids    Headache(784.0)    Hypothyroidism    IBS (irritable bowel syndrome)    Knee pain, right 12/13/2014   No pathology MRI R knee 12/17/14      Pain in right elbow 05/07/2017   Thyroid dysfunction     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   Bilateral lower extremity edema 08/12/2017   Transition of care performed with sharing of clinical summary 08/12/2017   Chronic right  shoulder pain 07/09/2017   Decreased range of motion of right shoulder 06/13/2017   Muscle weakness of right arm 06/13/2017   Neck pain 06/13/2017   Poor posture 06/13/2017   Asymptomatic menopausal state 05/06/2017   Localized edema 05/06/2017   Pure hypercholesterolemia 05/06/2017   Sprain of left foot 12/20/2016   Rotator cuff impingement syndrome 09/03/2016   Decreased vision in both eyes 06/05/2016   Rotator cuff impingement syndrome of right shoulder 01/16/2016   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   IBS (irritable bowel syndrome) 10/21/2015   Dyshidrotic hand dermatitis 10/21/2015   Dyshidrotic foot dermatitis 10/21/2015   Bright red blood per rectum 10/21/2015   S/P bilateral oophorectomy 10/21/2015   Hypothyroidism 10/21/2015   HLD (hyperlipidemia) 03/18/2015   Pelvic pain 12/25/2012   ELECTROCARDIOGRAM, ABNORMAL 11/05/2008    Past Surgical History:  Procedure Laterality Date   ABDOMINAL HYSTERECTOMY Bilateral 12/25/2012   Procedure: HYSTERECTOMY ABDOMINAL;  Surgeon: Allie Bossier, MD;  Location: [facility];  Service: Gynecology;  Laterality: Bilateral;  TAH and bilateral salpingectomy   BILATERAL SALPINGECTOMY Bilateral 12/25/2012   Procedure: BILATERAL SALPINGECTOMY;  Surgeon: Allie Bossier, MD;  Location: [facility];  Service: Gynecology;  Laterality: Bilateral;   CERVICAL BIOPSY  W/ LOOP ELECTRODE  EXCISION     CHOLECYSTECTOMY     COLONOSCOPY     10 + years ago   DILATION AND CURETTAGE OF UTERUS     OOPHORECTOMY Bilateral 12/25/2012   Procedure: OOPHORECTOMY;  Surgeon: Allie Bossier, MD;  Location: [facility];  Service: Gynecology;  Laterality: Bilateral;   TUBAL LIGATION     UPPER GASTROINTESTINAL ENDOSCOPY     10 + years    OB History     Gravida  3   Para  2   Term  1   Preterm  1   AB  1   Living  1      SAB      IAB  1   Ectopic      Multiple      Live Births               Home Medications    Prior to Admission medications   Medication  Sig Start Date End Date Taking? Authorizing Provider  Biotin 5000 MCG CAPS Take 5 capsules by mouth daily at 6 (six) AM. 01/06/16  Yes [provider]  Calcium Polycarbophil (FIBER) 625 MG TABS Take 2 tablets by mouth daily. 01/06/16  Yes [provider]  dicyclomine (BENTYL) 20 MG tablet Take 20 mg by mouth 4 (four) times daily. 01/06/16  Yes [provider]  estradiol (ESTRACE) 2 MG tablet Take 2 mg by mouth daily. 01/06/16  Yes [provider]  Evening Primrose Oil 500 MG CAPS Take 1 capsule by mouth daily at 6 (six) AM. 10/16/16  Yes [provider]  folic acid (FOLVITE) 400 MCG tablet Take 400 mcg by mouth daily. 10/16/16  Yes [provider]  hydrOXYzine (ATARAX) 10 MG tablet Take 10 mg by mouth daily as needed. 01/06/16  Yes [provider]  levothyroxine (SYNTHROID) 175 MCG tablet Take 175 mcg by mouth daily before breakfast. 01/06/16  Yes [provider]  Multiple Vitamin (QUINTABS) TABS Take 1 tablet by mouth daily. 01/06/16  Yes [provider]  traZODone (DESYREL) 50 MG tablet Take 50 mg by mouth at bedtime. 01/06/16  Yes [provider]  atorvastatin (LIPITOR) 40 MG tablet Take 1 tablet (40 mg total) by mouth daily. 02/05/23   Hoy Register, MD  buPROPion (WELLBUTRIN XL) 300 MG 24 hr tablet Take 1 tablet (300 mg total) by mouth daily. 10/17/22   Nwoko, Tommas Olp, PA  dicyclomine (BENTYL) 10 MG capsule Take 1 tablet (10 mg total) by mouth 2-3 times daily as needed for abdominal cramps and diarrhea 10/11/22   Sherrilyn Rist, MD  furosemide (LASIX) 20 MG tablet Take 1 tablet (20 mg total) by mouth daily. 02/05/23   Hoy Register, MD  hydrOXYzine (ATARAX) 10 MG tablet Take 0.5-1 tablets (5-10 mg total) by mouth 3 (three) times daily as needed for anxiety (sleep). 01/18/23   Bahraini, Sarah A  levothyroxine (SYNTHROID) 137 MCG tablet Take 1 tablet (137 mcg total) by mouth daily. 02/06/23   Hoy Register, MD  lithium  carbonate 300 MG capsule TAKE 1 CAPSULE BY MOUTH EVERY MORNING & TAKE 2 CAPSULES EVERY NIGHT AT BEDTIME 02/08/23   Bahraini, Sarah A  Multiple Vitamins-Minerals (ONE-A-DAY WOMENS PO) Take by mouth.    [provider]  omeprazole (PRILOSEC) 20 MG capsule Take 1 capsule (20 mg total) by mouth daily. 03/02/23   Glyn Ade, MD  pantoprazole (PROTONIX) 40 MG tablet Take 1 tablet (40 mg total) by mouth daily. Patient not  taking: Reported on 09/20/2022 09/05/22   Doree Albee, PA-C  QUEtiapine (SEROQUEL) 100 MG tablet Take 1 tablet (100 mg total) by mouth at bedtime. 01/18/23 04/18/23  Daine Gip    Family History Family History  Problem Relation Age of Onset   Hypertension Mother    Rheum arthritis Mother    Fibromyalgia Mother    COPD Mother    Sleep apnea Mother    Thyroid disease Mother    Ovarian cancer Mother 59   Heart disease Father    Lung cancer Father        dx 73s   Skin cancer Maternal Uncle        melanoma on back   Breast cancer Paternal Aunt        dx 38s   Lung cancer Paternal Uncle 44       mets to brain   Breast cancer Maternal Grandmother        dx 64s   Colon cancer Other        MGF's brother; dx 21s   Esophageal cancer Neg Hx    Stomach cancer Neg Hx     Social History Social History   Tobacco Use   Smoking status: Every Day    Current packs/day: 0.50    Average packs/day: 0.5 packs/day for 9.0 years (4.5 ttl pk-yrs)    Types: Cigarettes   Smokeless tobacco: Never  Vaping Use   Vaping status: Never Used  Substance Use Topics   Alcohol use: No    Alcohol/week: 0.0 standard drinks of alcohol   Drug use: No     Allergies   Bee venom, Latex, Penicillins, Ibuprofen, Omega-3, and Other   Review of Systems Review of Systems  Constitutional:  Negative for chills and fever.  Eyes:  Negative for discharge and redness.  Respiratory:  Negative for shortness of breath.   Cardiovascular:  Positive for chest pain.  Gastrointestinal:   Negative for abdominal pain, nausea and vomiting.  Musculoskeletal:  Positive for back pain.     Physical Exam Triage Vital Signs ED Triage Vitals [03/02/23 1134]  Encounter Vitals Group     BP 129/85     Systolic BP Percentile      Diastolic BP Percentile      Pulse Rate 66     Resp 18     Temp 98 F (36.7 C)     Temp Source Oral     SpO2 97 %     Weight      Height      Head Circumference      Peak Flow      Pain Score 5     Pain Loc      Pain Education      Exclude from Growth Chart    No data found.  Updated Vital Signs BP 129/85 (BP Location: Left Arm)   Pulse 66   Temp 98 F (36.7 C) (Oral)   Resp 18   LMP 11/29/2012   SpO2 97%     Physical Exam Vitals and nursing note reviewed.  Constitutional:      General: She is not in acute distress.    Appearance: Normal appearance. She is not ill-appearing.  HENT:     Head: Normocephalic and atraumatic.  Eyes:     Conjunctiva/sclera: Conjunctivae normal.  Cardiovascular:     Rate and Rhythm: Normal rate and regular rhythm.  Pulmonary:     Effort: Pulmonary effort is normal. No respiratory  distress.     Breath sounds: No wheezing, rhonchi or rales.  Neurological:     Mental Status: She is alert.  Psychiatric:        Mood and Affect: Mood normal.        Behavior: Behavior normal.        Thought Content: Thought content normal.      UC Treatments / Results  Labs (all labs ordered are listed, but only abnormal results are displayed) Labs Reviewed - No data to display  EKG   Radiology No results found.  Procedures Procedures (including critical care time)  Medications Ordered in UC Medications - No data to display  Initial Impression / Assessment and Plan / UC Course  I have reviewed the triage vital signs and the nursing notes.  Pertinent labs & imaging results that were available during my care of the patient were reviewed by me and considered in my medical decision making (see chart for  details).    EKG without ST elevation.  Given left-sided chest pain recommended further evaluation in the emergency room for further cardiac rule out.  Patient is agreeable to same.     Final Clinical Impressions(s) / UC Diagnoses   Final diagnoses:  Chest pain, unspecified type   Discharge Instructions   None    ED Prescriptions   None    PDMP not reviewed this encounter.   Tomi Bamberger, PA-C 03/06/23 [phone]

## 2023-03-08 ENCOUNTER — Encounter: Payer: Self-pay | Admitting: Nurse Practitioner

## 2023-03-08 ENCOUNTER — Ambulatory Visit: Payer: Managed Care, Other (non HMO) | Attending: Nurse Practitioner | Admitting: Nurse Practitioner

## 2023-03-08 VITALS — BP 130/82 | HR 58 | Ht 64.0 in | Wt 198.4 lb

## 2023-03-08 DIAGNOSIS — R109 Unspecified abdominal pain: Secondary | ICD-10-CM | POA: Diagnosis not present

## 2023-03-08 DIAGNOSIS — R300 Dysuria: Secondary | ICD-10-CM | POA: Diagnosis not present

## 2023-03-08 LAB — POCT URINALYSIS DIP (CLINITEK)
Bilirubin, UA: NEGATIVE
Blood, UA: NEGATIVE
Glucose, UA: NEGATIVE mg/dL
Ketones, POC UA: NEGATIVE mg/dL
Nitrite, UA: NEGATIVE
Spec Grav, UA: 1.02 (ref 1.010–1.025)
Urobilinogen, UA: 0.2 U/dL
pH, UA: 7.5 (ref 5.0–8.0)

## 2023-03-08 MED ORDER — PHENAZOPYRIDINE HCL 200 MG PO TABS: 200.0000 mg | ORAL_TABLET | Freq: Three times a day (TID) | ORAL | 0 refills | Status: DC | PRN

## 2023-03-08 NOTE — Progress Notes (Signed)
Assessment & Plan:  Kristen Moore was seen today for flank pain.  Diagnoses and all orders for this visit:  Burning with urination UA normal Culture pending -     POCT URINALYSIS DIP (CLINITEK) -     phenazopyridine (PYRIDIUM) 200 MG tablet; Take 1 tablet (200 mg total) by mouth 3 (three) times daily as needed for pain. -     Urine Culture  Left flank pain -     phenazopyridine (PYRIDIUM) 200 MG tablet; Take 1 tablet (200 mg total) by mouth 3 (three) times daily as needed for pain. -     Urine Culture    Patient has been counseled on age-appropriate routine health concerns for screening and prevention. These are reviewed and up-to-date. Referrals have been placed accordingly. Immunizations are up-to-date or declined.    Subjective:   Chief Complaint  Patient presents with   Flank Pain   HPI Kristen Moore 52 y.o. female presents to office today with complaints of flank pain.  She has a past medical history of Abnormal Pap smear, Anemia, Asthma, Bipolar 1 disorder, CHEST PAIN UNSPECIFIED (11/05/2008), Cyst of breast, DUB, Fibroids, Headache(784.0), Hypothyroidism, IBS, Knee pain, right (12/13/2014), Pain in right elbow (05/07/2017), and Thyroid dysfunction.   UTI symptoms: Patient complains of  left flank pain  She has had symptoms for several days. Patient also complains of back pain. Patient denies vaginal discharge. Patient does not have a history of recurrent UTI.  Patient does not have a history of pyelonephritis.     BP Readings from Last 3 Encounters:  03/08/23 130/82  03/02/23 (!) 136/92  03/02/23 129/85     Review of Systems  Constitutional:  Negative for fever, malaise/fatigue and weight loss.  HENT: Negative.  Negative for nosebleeds.   Eyes: Negative.  Negative for blurred vision, double vision and photophobia.  Respiratory: Negative.  Negative for cough and shortness of breath.   Cardiovascular: Negative.  Negative for chest pain, palpitations and leg  swelling.  Gastrointestinal: Negative.  Negative for heartburn, nausea and vomiting.  Genitourinary:  Positive for flank pain. Negative for dysuria, frequency, hematuria and urgency.  Musculoskeletal:  Negative for myalgias.  Neurological: Negative.  Negative for dizziness, focal weakness, seizures and headaches.  Psychiatric/Behavioral: Negative.  Negative for suicidal ideas.     Past Medical History:  Diagnosis Date   Abnormal Pap smear    cryo   Anemia    Asthma    as a child   Bipolar 1 disorder (HCC)    CHEST PAIN UNSPECIFIED 11/05/2008   Qualifier: Diagnosis of   By: York, LPN, Christine         Cyst of breast    left breast   DUB (dysfunctional uterine bleeding)    Fibroids    Headache(784.0)    Hypothyroidism    IBS (irritable bowel syndrome)    Knee pain, right 12/13/2014   No pathology MRI R knee 12/17/14      Pain in right elbow 05/07/2017   Thyroid dysfunction     Past Surgical History:  Procedure Laterality Date   ABDOMINAL HYSTERECTOMY Bilateral 12/25/2012   Procedure: HYSTERECTOMY ABDOMINAL;  Surgeon: Allie Bossier, MD;  Location: [facility];  Service: Gynecology;  Laterality: Bilateral;  TAH and bilateral salpingectomy   BILATERAL SALPINGECTOMY Bilateral 12/25/2012   Procedure: BILATERAL SALPINGECTOMY;  Surgeon: Allie Bossier, MD;  Location: [facility];  Service: Gynecology;  Laterality: Bilateral;   CERVICAL BIOPSY  W/ LOOP ELECTRODE EXCISION  CHOLECYSTECTOMY     COLONOSCOPY     10 + years ago   DILATION AND CURETTAGE OF UTERUS     OOPHORECTOMY Bilateral 12/25/2012   Procedure: OOPHORECTOMY;  Surgeon: Allie Bossier, MD;  Location: [facility];  Service: Gynecology;  Laterality: Bilateral;   TUBAL LIGATION     UPPER GASTROINTESTINAL ENDOSCOPY     10 + years    Family History  Problem Relation Age of Onset   Hypertension Mother    Rheum arthritis Mother    Fibromyalgia Mother    COPD Mother    Sleep apnea Mother    Thyroid disease Mother    Ovarian cancer Mother  41   Heart disease Father    Lung cancer Father        dx 86s   Skin cancer Maternal Uncle        melanoma on back   Breast cancer Paternal Aunt        dx 71s   Lung cancer Paternal Uncle 44       mets to brain   Breast cancer Maternal Grandmother        dx 60s   Colon cancer Other        MGF's brother; dx 76s   Esophageal cancer Neg Hx    Stomach cancer Neg Hx     Social History Reviewed with no changes to be made today.   Outpatient Medications Prior to Visit  Medication Sig Dispense Refill   atorvastatin (LIPITOR) 40 MG tablet Take 1 tablet (40 mg total) by mouth daily. 90 tablet 1   Biotin 5000 MCG CAPS Take 5 capsules by mouth daily at 6 (six) AM.     buPROPion (WELLBUTRIN XL) 300 MG 24 hr tablet Take 1 tablet (300 mg total) by mouth daily. 90 tablet 3   Calcium Polycarbophil (FIBER) 625 MG TABS Take 2 tablets by mouth daily.     dicyclomine (BENTYL) 10 MG capsule Take 1 tablet (10 mg total) by mouth 2-3 times daily as needed for abdominal cramps and diarrhea 180 capsule 1   dicyclomine (BENTYL) 20 MG tablet Take 20 mg by mouth 4 (four) times daily.     estradiol (ESTRACE) 2 MG tablet Take 2 mg by mouth daily.     Evening Primrose Oil 500 MG CAPS Take 1 capsule by mouth daily at 6 (six) AM.     folic acid (FOLVITE) 400 MCG tablet Take 400 mcg by mouth daily.     furosemide (LASIX) 20 MG tablet Take 1 tablet (20 mg total) by mouth daily. 30 tablet 3   hydrOXYzine (ATARAX) 10 MG tablet Take 0.5-1 tablets (5-10 mg total) by mouth 3 (three) times daily as needed for anxiety (sleep). 60 tablet 2   hydrOXYzine (ATARAX) 10 MG tablet Take 10 mg by mouth daily as needed.     levothyroxine (SYNTHROID) 137 MCG tablet Take 1 tablet (137 mcg total) by mouth daily. 90 tablet 1   levothyroxine (SYNTHROID) 175 MCG tablet Take 175 mcg by mouth daily before breakfast.     lithium carbonate 300 MG capsule TAKE 1 CAPSULE BY MOUTH EVERY MORNING & TAKE 2 CAPSULES EVERY NIGHT AT BEDTIME 90 capsule  2   Multiple Vitamin (QUINTABS) TABS Take 1 tablet by mouth daily.     Multiple Vitamins-Minerals (ONE-A-DAY WOMENS PO) Take by mouth.     omeprazole (PRILOSEC) 20 MG capsule Take 1 capsule (20 mg total) by mouth daily. 14 capsule 0   pantoprazole (PROTONIX)  40 MG tablet Take 1 tablet (40 mg total) by mouth daily. (Patient not taking: Reported on 09/20/2022) 30 tablet 3   QUEtiapine (SEROQUEL) 100 MG tablet Take 1 tablet (100 mg total) by mouth at bedtime. 30 tablet 2   traZODone (DESYREL) 50 MG tablet Take 50 mg by mouth at bedtime.     No facility-administered medications prior to visit.    Allergies  Allergen Reactions   Bee Venom Swelling and Anaphylaxis   Latex Swelling   Penicillins Swelling    Has patient had a PCN reaction causing immediate rash, facial/tongue/throat swelling, SOB or lightheadedness with hypotension: yes Has patient had a PCN reaction causing severe rash involving mucus membranes or skin necrosis: no Has patient had a PCN reaction that required hospitalization no Has patient had a PCN reaction occurring within the last 10 years: no If all of the above answers are "NO", then may proceed with Cephalosporin   Ibuprofen Swelling   Omega-3 Hives and Swelling    Potatoes     Other Hives and Swelling    Potatoes          Objective:    BP 130/82   Pulse (!) 58   Ht 5\' 4"  (1.626 m)   Wt 198 lb 6.4 oz (90 kg)   LMP 11/29/2012   SpO2 96%   BMI 34.06 kg/m  Wt Readings from Last 3 Encounters:  03/08/23 198 lb 6.4 oz (90 kg)  03/02/23 200 lb (90.7 kg)  02/05/23 201 lb 12.8 oz (91.5 kg)    Physical Exam Vitals and nursing note reviewed.  Constitutional:      Appearance: She is well-developed.  HENT:     Head: Normocephalic and atraumatic.  Cardiovascular:     Rate and Rhythm: Normal rate and regular rhythm.     Heart sounds: Normal heart sounds. No murmur heard.    No friction rub. No gallop.  Pulmonary:     Effort: Pulmonary effort is normal. No  tachypnea or respiratory distress.     Breath sounds: Normal breath sounds. No decreased breath sounds, wheezing, rhonchi or rales.  Chest:     Chest wall: No tenderness.  Abdominal:     General: Bowel sounds are normal.     Palpations: Abdomen is soft.  Musculoskeletal:        General: Normal range of motion.     Cervical back: Normal range of motion.  Skin:    General: Skin is warm and dry.  Neurological:     Mental Status: She is alert and oriented to person, place, and time.     Coordination: Coordination normal.  Psychiatric:        Behavior: Behavior normal. Behavior is cooperative.        Thought Content: Thought content normal.        Judgment: Judgment normal.          Patient has been counseled extensively about nutrition and exercise as well as the importance of adherence with medications and regular follow-up. The patient was given clear instructions to go to ER or return to medical center if symptoms don't improve, worsen or new problems develop. The patient verbalized understanding.   Follow-up: Return in about 3 months (around 06/08/2023).   Claiborne Rigg, FNP-BC [facility] [place], [place] [phone]   03/08/2023, 1:11 PM

## 2023-03-10 LAB — URINE CULTURE

## 2023-03-12 ENCOUNTER — Telehealth: Payer: Self-pay

## 2023-03-12 NOTE — Telephone Encounter (Signed)
Copied from CRM [id number]. Topic: General - Inquiry >> Mar 12, 2023  9:58 AM Patsy Lager T wrote: Reason for CRM: Dorothy from [facility] called stated the change of provider form needs to be completed and faxed to the care plan for patient. She stated she fax# is on the form

## 2023-03-13 NOTE — Telephone Encounter (Signed)
Noted form will be faxed once complete.

## 2023-03-14 ENCOUNTER — Other Ambulatory Visit (HOSPITAL_COMMUNITY): Payer: Self-pay | Admitting: Psychiatry

## 2023-03-14 DIAGNOSIS — F319 Bipolar disorder, unspecified: Secondary | ICD-10-CM

## 2023-03-14 NOTE — Progress Notes (Signed)
Patient did not connect for virtual psychiatric medication management appointment on 03/15/23 at 11AM. Sent secure video link with no response. Called phone with no answer; left VM with callback number to reschedule.  Daine Gip, MD 03/16/23   This encounter was created in error - please disregard.

## 2023-03-15 ENCOUNTER — Ambulatory Visit (INDEPENDENT_AMBULATORY_CARE_PROVIDER_SITE_OTHER): Payer: Medicaid Other | Admitting: Licensed Clinical Social Worker

## 2023-03-15 ENCOUNTER — Encounter (HOSPITAL_COMMUNITY): Payer: Commercial Managed Care - HMO | Admitting: Psychiatry

## 2023-03-15 ENCOUNTER — Encounter (HOSPITAL_COMMUNITY): Payer: Self-pay

## 2023-03-15 DIAGNOSIS — F411 Generalized anxiety disorder: Secondary | ICD-10-CM

## 2023-03-15 NOTE — Progress Notes (Signed)
Patient ID: Kristen Moore, female   DOB: 21-Sep-1970, 52 y.o.   MRN: [id number]  Pt came into session 15 minutes late. She asked to reschedule due to a family emergency last night her father was in the hospital and pt was with him. LCSW rescheduled for next avb appt on nov 15th 8am virtual

## 2023-04-04 ENCOUNTER — Encounter: Payer: Self-pay | Admitting: Dermatology

## 2023-04-04 ENCOUNTER — Ambulatory Visit: Payer: Managed Care, Other (non HMO) | Admitting: Dermatology

## 2023-04-04 VITALS — BP 132/77

## 2023-04-04 DIAGNOSIS — Z1283 Encounter for screening for malignant neoplasm of skin: Secondary | ICD-10-CM | POA: Diagnosis not present

## 2023-04-04 DIAGNOSIS — D492 Neoplasm of unspecified behavior of bone, soft tissue, and skin: Secondary | ICD-10-CM

## 2023-04-04 DIAGNOSIS — D3613 Benign neoplasm of peripheral nerves and autonomic nervous system of lower limb, including hip: Secondary | ICD-10-CM | POA: Diagnosis not present

## 2023-04-04 DIAGNOSIS — D2372 Other benign neoplasm of skin of left lower limb, including hip: Secondary | ICD-10-CM

## 2023-04-04 DIAGNOSIS — W908XXA Exposure to other nonionizing radiation, initial encounter: Secondary | ICD-10-CM

## 2023-04-04 DIAGNOSIS — L821 Other seborrheic keratosis: Secondary | ICD-10-CM

## 2023-04-04 DIAGNOSIS — D239 Other benign neoplasm of skin, unspecified: Secondary | ICD-10-CM

## 2023-04-04 DIAGNOSIS — D485 Neoplasm of uncertain behavior of skin: Secondary | ICD-10-CM

## 2023-04-04 DIAGNOSIS — L578 Other skin changes due to chronic exposure to nonionizing radiation: Secondary | ICD-10-CM | POA: Diagnosis not present

## 2023-04-04 DIAGNOSIS — Z808 Family history of malignant neoplasm of other organs or systems: Secondary | ICD-10-CM

## 2023-04-04 DIAGNOSIS — Z85828 Personal history of other malignant neoplasm of skin: Secondary | ICD-10-CM

## 2023-04-04 DIAGNOSIS — D1801 Hemangioma of skin and subcutaneous tissue: Secondary | ICD-10-CM

## 2023-04-04 DIAGNOSIS — L814 Other melanin hyperpigmentation: Secondary | ICD-10-CM

## 2023-04-04 NOTE — Patient Instructions (Signed)

## 2023-04-04 NOTE — Progress Notes (Signed)
New Patient Visit   Subjective  Kristen Moore is a 52 y.o. female who presents for the following: Skin Cancer Screening and Full Body Skin Exam - History of BCC of right cheek (2022). Family history of Melanoma in maternal uncle  The patient presents for Total-Body Skin Exam (TBSE) for skin cancer screening and mole check. The patient has spots, moles and lesions to be evaluated, some may be new or changing.   The following portions of the chart were reviewed this encounter and updated as appropriate: medications, allergies, medical history  Review of Systems:  No other skin or systemic complaints except as noted in HPI or Assessment and Plan.  Objective  Well appearing patient in no apparent distress; mood and affect are within normal limits.  A full examination was performed including scalp, head, eyes, ears, nose, lips, neck, chest, axillae, abdomen, back, buttocks, bilateral upper extremities, bilateral lower extremities, hands, feet, fingers, toes, fingernails, and toenails. All findings within normal limits unless otherwise noted below.   Relevant physical exam findings are noted in the Assessment and Plan.  Left Thigh 0.5 cm flesh colored papule     Left shin 1.2 x 1.0 cm irregular shaped brown macule      Assessment & Plan   SKIN CANCER SCREENING PERFORMED TODAY.  ACTINIC DAMAGE - Chronic condition, secondary to cumulative UV/sun exposure - diffuse scaly erythematous macules with underlying dyspigmentation - Recommend daily broad spectrum sunscreen SPF 30+ to sun-exposed areas, reapply every 2 hours as needed.  - Staying in the shade or wearing long sleeves, sun glasses (UVA+UVB protection) and wide brim hats (4-inch brim around the entire circumference of the hat) are also recommended for sun protection.  - Call for new or changing lesions.  LENTIGINES, SEBORRHEIC KERATOSES, HEMANGIOMAS - Benign normal skin lesions - Benign-appearing - Call for any  changes  MELANOCYTIC NEVI - Tan-brown and/or pink-flesh-colored symmetric macules and papules - Benign appearing on exam today - Observation - Call clinic for new or changing moles - Recommend daily use of broad spectrum spf 30+ sunscreen to sun-exposed areas.   DERMATOFIBROMA Exam: Firm pink/brown papule/nodules with dimple sign of left lower leg x 2. Treatment Plan: A dermatofibroma is a benign growth possibly related to trauma, such as an insect bite, cut from shaving, or inflamed acne-type bump.  Treatment options to remove include shave or excision with resulting scar and risk of recurrence.  Since benign-appearing and not bothersome, will observe for now.    Neoplasm of uncertain behavior of skin (2) Left Thigh  Skin / nail biopsy Type of biopsy: tangential   Informed consent: discussed and consent obtained   Timeout: patient name, date of birth, surgical site, and procedure verified   Procedure prep:  Patient was prepped and draped in usual sterile fashion Prep type:  Isopropyl alcohol Anesthesia: the lesion was anesthetized in a standard fashion   Anesthetic:  1% lidocaine w/ epinephrine 1-100,000 buffered w/ 8.4% NaHCO3 Instrument used: flexible razor blade   Hemostasis achieved with: pressure, aluminum chloride and electrodesiccation   Outcome: patient tolerated procedure well   Post-procedure details: sterile dressing applied and wound care instructions given   Dressing type: bandage and petrolatum    Left shin  Skin / nail biopsy Type of biopsy: tangential   Informed consent: discussed and consent obtained   Timeout: patient name, date of birth, surgical site, and procedure verified   Procedure prep:  Patient was prepped and draped in usual sterile fashion Prep type:  Isopropyl alcohol Anesthesia: the lesion was anesthetized in a standard fashion   Anesthetic:  1% lidocaine w/ epinephrine 1-100,000 buffered w/ 8.4% NaHCO3 Instrument used: flexible razor blade    Hemostasis achieved with: pressure, aluminum chloride and electrodesiccation   Outcome: patient tolerated procedure well   Post-procedure details: sterile dressing applied and wound care instructions given   Dressing type: bandage and petrolatum     Return in about 1 year (around 04/03/2024) for TBSE.  I, Joanie Coddington, CMA, am acting as scribe for Gwenith Daily, MD .   Documentation: I have reviewed the above documentation for accuracy and completeness, and I agree with the above.  Gwenith Daily, MD

## 2023-04-05 ENCOUNTER — Other Ambulatory Visit (HOSPITAL_COMMUNITY): Payer: Self-pay | Admitting: Psychiatry

## 2023-04-08 LAB — SURGICAL PATHOLOGY

## 2023-04-17 ENCOUNTER — Emergency Department (HOSPITAL_COMMUNITY)
Admission: EM | Admit: 2023-04-17 | Discharge: 2023-04-17 | Disposition: A | Discharge status: Home / Self Care | Payer: Commercial Managed Care - HMO | Attending: Emergency Medicine | Admitting: Emergency Medicine

## 2023-04-17 ENCOUNTER — Encounter (HOSPITAL_COMMUNITY): Payer: Self-pay

## 2023-04-17 DIAGNOSIS — L03116 Cellulitis of left lower limb: Secondary | ICD-10-CM | POA: Diagnosis not present

## 2023-04-17 DIAGNOSIS — M7989 Other specified soft tissue disorders: Secondary | ICD-10-CM | POA: Diagnosis present

## 2023-04-17 DIAGNOSIS — Z5189 Encounter for other specified aftercare: Secondary | ICD-10-CM

## 2023-04-17 DIAGNOSIS — J45909 Unspecified asthma, uncomplicated: Secondary | ICD-10-CM | POA: Diagnosis not present

## 2023-04-17 DIAGNOSIS — Z4800 Encounter for change or removal of nonsurgical wound dressing: Secondary | ICD-10-CM | POA: Diagnosis not present

## 2023-04-17 DIAGNOSIS — Z91040 Latex allergy status: Secondary | ICD-10-CM | POA: Diagnosis not present

## 2023-04-17 MED ORDER — DOXYCYCLINE HYCLATE 100 MG PO CAPS: 100.0000 mg | ORAL_CAPSULE | Freq: Two times a day (BID) | ORAL | 0 refills | Status: DC

## 2023-04-17 NOTE — Discharge Instructions (Addendum)
Stop using the Neosporin and the peroxide on the biopsy sites.  You can place a little Vaseline over it and a bandage and gently wash with soap and water.  Elevate your leg as often as you can to help with swelling but when you are at work wear a compression sock to help avoid any further swelling.  You should notice improvement in the next 3 to 4 days.  If it is getting worse the redness is spreading more or you start having fevers return to the emergency room.

## 2023-04-17 NOTE — ED Provider Notes (Signed)
[facility] AT [facility] Provider Note   CSN: [id number] Arrival date & time: 04/17/23  1004     History  Chief Complaint  Patient presents with   Wound Check    Kristen Moore is a 52 y.o. female.  Patient is a 52 year old female with a history of thyroid disease, bipolar disease, anemia, asthma who is presenting today due to concern for wound infection.  At the end of October patient had 2 biopsies done on her left leg to evaluate for cancer.  She reports that the 2 biopsy sites are becoming more red and the one on her shin in the last 3 or 4 days has started to have some swelling and redness going down her leg.  She has not had fever but states she is very worried and has not felt good since the area is gotten worse.  She does complain of a burning stinging pain in the biopsy sites.  She has been putting Neosporin on the area as well as using peroxide.  The history is provided by the patient.  Wound Check       Home Medications Prior to Admission medications   Medication Sig Start Date End Date Taking? Authorizing Provider  doxycycline (VIBRAMYCIN) 100 MG capsule Take 1 capsule (100 mg total) by mouth 2 (two) times daily. 04/17/23  Yes Krishawn Vanderweele, Alphonzo Lemmings, MD  atorvastatin (LIPITOR) 40 MG tablet Take 1 tablet (40 mg total) by mouth daily. 02/05/23   Hoy Register, MD  Biotin 5000 MCG CAPS Take 5 capsules by mouth daily at 6 (six) AM. 01/06/16   [provider]  buPROPion (WELLBUTRIN XL) 300 MG 24 hr tablet Take 1 tablet (300 mg total) by mouth daily. 10/17/22   Nwoko, Tommas Olp, PA  Calcium Polycarbophil (FIBER) 625 MG TABS Take 2 tablets by mouth daily. 01/06/16   [provider]  dicyclomine (BENTYL) 10 MG capsule Take 1 tablet (10 mg total) by mouth 2-3 times daily as needed for abdominal cramps and diarrhea 10/11/22   Sherrilyn Rist, MD  dicyclomine (BENTYL) 20 MG tablet Take 20 mg by mouth 4 (four) times daily. 01/06/16    [provider]  estradiol (ESTRACE) 2 MG tablet Take 2 mg by mouth daily. 01/06/16   [provider]  folic acid (FOLVITE) 400 MCG tablet Take 400 mcg by mouth daily. 10/16/16   [provider]  furosemide (LASIX) 20 MG tablet Take 1 tablet (20 mg total) by mouth daily. 02/05/23   Hoy Register, MD  hydrOXYzine (ATARAX) 10 MG tablet Take 0.5-1 tablets (5-10 mg total) by mouth 3 (three) times daily as needed for anxiety (sleep). 01/18/23   Bah[place]i, Sarah A  hydrOXYzine (ATARAX) 10 MG tablet Take 10 mg by mouth daily as needed. 01/06/16   [provider]  levothyroxine (SYNTHROID) 137 MCG tablet Take 1 tablet (137 mcg total) by mouth daily. 02/06/23   Hoy Register, MD  levothyroxine (SYNTHROID) 175 MCG tablet Take 175 mcg by mouth daily before breakfast. 01/06/16   [provider]  lithium carbonate 300 MG capsule TAKE 1 CAPSULE (300 MG TOTAL) BY MOUTH IN THE MORNING AND 2 CAPSULES (600 MG TOTAL) AT BEDTIME. 03/15/23   [place]i, Sarah A  Multiple Vitamin (QUINTABS) TABS Take 1 tablet by mouth daily. 01/06/16   [provider]  Multiple Vitamins-Minerals (ONE-A-DAY WOMENS PO) Take by mouth.    [provider]  omeprazole (PRILOSEC) 20 MG capsule Take 1 capsule (20 mg  total) by mouth daily. 03/02/23   Glyn Ade, MD  phenazopyridine (PYRIDIUM) 200 MG tablet Take 1 tablet (200 mg total) by mouth 3 (three) times daily as needed for pain. 03/08/23   Claiborne Rigg, NP  QUEtiapine (SEROQUEL) 100 MG tablet TAKE 1 TABLET BY MOUTH EVERYDAY AT BEDTIME 04/08/23   [place], Sarah A  traZODone (DESYREL) 50 MG tablet Take 50 mg by mouth at bedtime. 01/06/16   [provider]      Allergies    Bee venom, Latex, Penicillins, Ibuprofen, Omega-3, and Other    Review of Systems   Review of Systems  Physical Exam Updated Vital Signs BP (!) 138/90 (BP Location: Left Arm)   Pulse 66   Temp 97.9 F (36.6 C) (Oral)   Resp 16   Ht 5\' 4"  (1.626  m)   Wt 89.8 kg   LMP 11/29/2012   SpO2 99%   BMI 33.99 kg/m  Physical Exam Vitals and nursing note reviewed.  HENT:     Head: Normocephalic.  Cardiovascular:     Rate and Rhythm: Normal rate.  Pulmonary:     Effort: Pulmonary effort is normal.  Musculoskeletal:       Legs:  Skin:    General: Skin is warm.  Neurological:     Mental Status: She is alert. Mental status is at baseline.  Psychiatric:        Mood and Affect: Mood normal.     ED Results / Procedures / Treatments   Labs (all labs ordered are listed, but only abnormal results are displayed) Labs Reviewed - No data to display  EKG None  Radiology No results found.  Procedures Procedures    Medications Ordered in ED Medications - No data to display  ED Course/ Medical Decision Making/ A&P                                 Medical Decision Making  Patient presenting today with pain at 2 biopsy sites on her leg with surrounding erythema.  Appears to have some mild cellulitis of the left lower leg biopsy site with erythema going down the leg.  However localized irritation around both biopsy sites.  Feel that the localized irritation is most likely related to the patient's Neosporin and peroxide use.  She is otherwise well-appearing.  Low suspicion for clot at this time.  Will start patient on doxycycline for for concern for cellulitis but will also have patient discontinue Neosporin and peroxide and just use Vaseline as well as a compression sock on the left lower leg when she is working.  Encouraged her to follow-up with dermatology next week for recheck.  This time patient does not appear systemically ill and does not warrant blood work.        Final Clinical Impression(s) / ED Diagnoses Final diagnoses:  Cellulitis of left lower extremity  Visit for wound check    Rx / DC Orders ED Discharge Orders          Ordered    doxycycline (VIBRAMYCIN) 100 MG capsule  2 times daily        04/17/23 1033               Gwyneth Sprout, MD 04/17/23 1033

## 2023-04-17 NOTE — ED Triage Notes (Addendum)
Pt c/o pain and redness around wounds on anterior L thigh and shin.  Pain score 8/10.  Pt reports she had skin biopsies in both areas in October.  Pt never followed up with Dermatology after procedure.      Pt reports she has been cleaning both wounds w/ peroxide and neosporin.

## 2023-04-19 ENCOUNTER — Ambulatory Visit (INDEPENDENT_AMBULATORY_CARE_PROVIDER_SITE_OTHER): Payer: 59 | Admitting: Licensed Clinical Social Worker

## 2023-04-19 ENCOUNTER — Encounter (HOSPITAL_COMMUNITY): Payer: Self-pay

## 2023-04-19 DIAGNOSIS — F319 Bipolar disorder, unspecified: Secondary | ICD-10-CM

## 2023-04-19 DIAGNOSIS — F411 Generalized anxiety disorder: Secondary | ICD-10-CM
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
## 2023-04-23 ENCOUNTER — Other Ambulatory Visit (HOSPITAL_COMMUNITY): Payer: Self-pay
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
## 2023-04-24 ENCOUNTER — Other Ambulatory Visit: Payer: Self-pay

## 2023-04-24 ENCOUNTER — Telehealth (HOSPITAL_COMMUNITY): Payer: 59 | Admitting: Psychiatry

## 2023-04-24 ENCOUNTER — Encounter (HOSPITAL_COMMUNITY): Payer: Self-pay | Admitting: Psychiatry

## 2023-04-24 DIAGNOSIS — F411 Generalized anxiety disorder: Secondary | ICD-10-CM | POA: Diagnosis not present

## 2023-04-24 DIAGNOSIS — Z79899 Other long term (current) drug therapy: Secondary | ICD-10-CM

## 2023-04-24 DIAGNOSIS — F319 Bipolar disorder, unspecified: Secondary | ICD-10-CM

## 2023-04-24 MED ORDER — BUPROPION HCL ER (XL) 300 MG PO TB24
300.0000 mg | ORAL_TABLET | Freq: Every day | ORAL | 2 refills | Status: DC
Start: 2023-04-24 — End: 2023-06-17

## 2023-04-24 MED ORDER — HYDROXYZINE HCL 10 MG PO TABS: 5.0000 mg | ORAL_TABLET | Freq: Three times a day (TID) | ORAL | 2 refills | Status: DC | PRN

## 2023-04-24 MED ORDER — QUETIAPINE FUMARATE 100 MG PO TABS: 100.0000 mg | ORAL_TABLET | Freq: Every day | ORAL | 2 refills | Status: DC

## 2023-04-24 MED ORDER — LITHIUM CARBONATE 300 MG PO CAPS
ORAL_CAPSULE | ORAL | 2 refills | Status: DC
Start: 2023-04-24 — End: 2023-06-14

## 2023-04-24 NOTE — Patient Instructions (Signed)
Thank you for attending your appointment today.  -- INCREASE Seroquel to 100 mg nightly -- Continue other medications as prescribed.  Please do not make any changes to medications without first discussing with your provider. If you are experiencing a psychiatric emergency, please call 911 or present to your nearest emergency department. Additional crisis, medication management, and therapy resources are included below.  [facility]  [address] [place], [place] [address] [phone] WALK-IN URGENT CARE 24/7 FOR ANYONE [address], [place], [place]  [phone] Fax: [phone] [email or web address] *Interpreters available *Accepts all insurance and uninsured for Urgent Care needs *Accepts Medicaid and uninsured for outpatient treatment (below)      ONLY FOR [facility]  Below:    Outpatient New Patient Assessment/Therapy Walk-ins:        Monday, Wednesday, and Thursday 8am until slots are full (first come, first served)                   New Patient Psychiatry/Medication Management        Monday-Friday 8am-11am (first come, first served)               For all walk-ins we ask that you arrive by 7:15am, because patients will be seen in the order of arrival.

## 2023-05-03 ENCOUNTER — Other Ambulatory Visit (HOSPITAL_COMMUNITY): Payer: Self-pay

## 2023-05-03 ENCOUNTER — Other Ambulatory Visit: Payer: Self-pay

## 2023-05-05 ENCOUNTER — Other Ambulatory Visit (HOSPITAL_COMMUNITY): Payer: Self-pay | Admitting: Physician Assistant

## 2023-05-05 DIAGNOSIS — F411 Generalized anxiety disorder: Secondary | ICD-10-CM

## 2023-05-10 ENCOUNTER — Other Ambulatory Visit: Payer: Self-pay | Admitting: Family Medicine

## 2023-05-13 ENCOUNTER — Other Ambulatory Visit (HOSPITAL_COMMUNITY): Payer: Commercial Managed Care - HMO

## 2023-05-14 NOTE — Progress Notes (Unsigned)
05/14/2023 Kristen Moore [id number] 07-21-1970  Referring provider: Hoy Register, MD Primary GI doctor: Dr. Orvan Falconer  ASSESSMENT AND PLAN:   Family history of colon cancer and rectal bleeding, loose stools Did not complete prep last colonoscopy, 3 mm polyps removed Repeat colon, appropriate LEC with Dr. Orvan Falconer We have discussed the risks of bleeding, infection, perforation, medication reactions, and remote risk of death associated with colonoscopy. All questions were answered and the patient acknowledges these risk and wishes to proceed. Long discussion about prep Consider SIBO treatment if negative  Generalized postprandial abdominal pain with GERD, history of s/p cholecystectomy  Lifestyle changes discussed, avoid NSAIDS, ETOH Will do trial of PPI once daily and Will schedule for EGD.  Will schedule EGD to evaluate for possible H. pylori, esophagitis, gastritis, peptic ulcer disease, celiac etc.. I discussed risks of EGD with patient today, including risk of sedation, bleeding or perforation.  Patient provides understanding and gave verbal consent to proceed.   Patient Care Team: Hoy Register, MD as PCP - General (Family Medicine)  HISTORY OF PRESENT ILLNESS: 52 y.o. female with a past medical history of bipolar 1 disorder, dysfunctional uterine bleeding with history of anemia, hypothyroidism, IBS, s/p cholecystectomy and others listed below presents for evaluation of rectal bleeding and diarrhea.   03/07/2022 colonoscopy with Dr. Orvan Falconer for screening purposes and adequate bowel prep stool in the entire examined colon.  3 mm polyp distal sigmoid she was to repeat colonoscopy at next available appointment.   05/07/2022 negative genetic testing 09/05/2022 office visit with myself for rectal bleeding, loose stools and family history of colon cancer, generalized postprandial abdominal pain status post cholecystectomy. 09/20/2022 colonoscopy EGD with Dr. Myrtie Neither for  diarrhea and epigastric pain  EGD showed normal esophagus, normal mucosa stomach and duodenum. Colonoscopy 2-day prep with good prep after lavage, significant looping, redundant colon showed 12 mm polyp otherwise normal mucosa, internal hemorrhoids Pathology showed tubulovillous adenoma recall colonoscopy in 3 years.  Endoscopic biopsies of stomach and duodenum unremarkable for H. pylori, EOE, celiac. 10/03/2022 CT abdomen pelvis without contrast showed no acute pathology 03/02/2023 CT angio chest abdomen pelvis for suspected acute aortic syndrome shows no PE no acute findings in chest no aortic dissection or aneurysm no acute findings in the abdomen.  Patient was given dicyclomine, had negative pancreatic elastase. Consider testing for SIBO versus treatment.  She states she did the prep at night but not in the morning.  She has had yellow/red diarrhea that looks like blood and very rare can happen daily.  Can have back to rectal pain and then will have liquid leakage with blood, has happened several times.  She has lost weight after her mom died, thyroid was hyperthyroid and this was fixed, weight is up.  She had 2 sandwiches last night eating [place] and has severe epigastric pain. [place] food is worse.  She has GERD occasionally, some nausea, no vomiting.  She has had trouble swallowing.   Denies NSAIDS, denies ETOH. She does smoke cig, no drug use.  She ran into a tree branch last night in her left eye, has tearing and pain, still has pain and erythema. She tried to flush her eye. She continues to have pain.  She has been losing her hair.   Maternal uncle with colon cancer. Mother with ovarian cancer. Maternal grandmother with breast cancer. No known family history of colon cancer or polyps. No family history of uterine/endometrial cancer, pancreatic cancer or gastric/stomach cancer.  Wt Readings from Last  6 Encounters:  04/17/23 198 lb (89.8 kg)  03/08/23 198 lb 6.4 oz (90 kg)  03/02/23  200 lb (90.7 kg)  02/05/23 201 lb 12.8 oz (91.5 kg)  12/10/22 201 lb 12.8 oz (91.5 kg)  10/03/22 199 lb (90.3 kg)      She  reports that she has been smoking cigarettes. She has a 4.5 pack-year smoking history. She has never used smokeless tobacco. She reports that she does not drink alcohol and does not use drugs.  RELEVANT LABS AND IMAGING: CBC    Component Value Date/Time   WBC 6.6 03/02/2023 1313   RBC 4.52 03/02/2023 1313   HGB 14.5 03/02/2023 1313   HGB 14.5 05/30/2022 1544   HCT 45.3 03/02/2023 1313   HCT 42.1 05/30/2022 1544   PLT 153 03/02/2023 1313   PLT 187 05/30/2022 1544   MCV 100.2 (H) 03/02/2023 1313   MCV 95 05/30/2022 1544   MCH 32.1 03/02/2023 1313   MCHC 32.0 03/02/2023 1313   RDW 12.7 03/02/2023 1313   RDW 11.8 05/30/2022 1544   LYMPHSABS 1.4 10/03/2022 1409   LYMPHSABS 1.3 05/30/2022 1544   MONOABS 0.3 10/03/2022 1409   EOSABS 0.2 10/03/2022 1409   EOSABS 0.1 05/30/2022 1544   BASOSABS 0.0 10/03/2022 1409   BASOSABS 0.0 05/30/2022 1544   Recent Labs    05/30/22 1544 10/03/22 1409 03/02/23 1313  HGB 14.5 14.1 14.5     CMP     Component Value Date/Time   NA 137 03/02/2023 1313   NA 141 02/05/2023 1704   K 4.1 03/02/2023 1313   CL 106 03/02/2023 1313   CO2 23 03/02/2023 1313   GLUCOSE 95 03/02/2023 1313   BUN 16 03/02/2023 1313   BUN 13 02/05/2023 1704   CREATININE 0.74 03/02/2023 1313   CREATININE 0.80 03/15/2015 1742   CALCIUM 9.6 03/02/2023 1313   PROT 6.8 02/05/2023 1704   ALBUMIN 4.5 02/05/2023 1704   AST 23 02/05/2023 1704   ALT 34 (H) 02/05/2023 1704   ALKPHOS 96 02/05/2023 1704   BILITOT 0.3 02/05/2023 1704   GFRNONAA >60 03/02/2023 1313   GFRNONAA >89 09/08/2014 1712   GFRAA 121 06/30/2019 1528   GFRAA >89 09/08/2014 1712      Latest Ref Rng & Units 02/05/2023    5:04 PM 10/03/2022    2:09 PM 05/30/2022    3:44 PM  Hepatic Function  Total Protein 6.0 - 8.5 g/dL 6.8  6.8  6.5   Albumin 3.9 - 4.9 g/dL 4.5  4.0  4.5    AST 0 - 40 IU/L 23  16  15    ALT 0 - 32 IU/L 34  19  17   Alk Phosphatase 44 - 121 IU/L 96  54  80   Total Bilirubin 0.0 - 1.2 mg/dL 0.3  0.8  0.3       Current Medications:   Current Outpatient Medications (Endocrine & Metabolic):    estradiol (ESTRACE) 2 MG tablet, Take 2 mg by mouth daily.   levothyroxine (SYNTHROID) 137 MCG tablet, Take 1 tablet (137 mcg total) by mouth daily.   levothyroxine (SYNTHROID) 175 MCG tablet, Take 175 mcg by mouth daily before breakfast.  Current Outpatient Medications (Cardiovascular):    atorvastatin (LIPITOR) 40 MG tablet, Take 1 tablet (40 mg total) by mouth daily.   furosemide (LASIX) 20 MG tablet, TAKE 1 TABLET BY MOUTH EVERY DAY    Current Outpatient Medications (Hematological):    folic acid (FOLVITE) 400 MCG tablet,  Take 400 mcg by mouth daily.  Current Outpatient Medications (Other):    Biotin 5000 MCG CAPS, Take 5 capsules by mouth daily at 6 (six) AM.   buPROPion (WELLBUTRIN XL) 300 MG 24 hr tablet, Take 1 tablet (300 mg total) by mouth daily.   Calcium Polycarbophil (FIBER) 625 MG TABS, Take 2 tablets by mouth daily.   dicyclomine (BENTYL) 10 MG capsule, Take 1 tablet (10 mg total) by mouth 2-3 times daily as needed for abdominal cramps and diarrhea   dicyclomine (BENTYL) 20 MG tablet, Take 20 mg by mouth 4 (four) times daily.   doxycycline (VIBRAMYCIN) 100 MG capsule, Take 1 capsule (100 mg total) by mouth 2 (two) times daily.   hydrOXYzine (ATARAX) 10 MG tablet, Take 0.5-1 tablets (5-10 mg total) by mouth 3 (three) times daily as needed for anxiety (sleep).   lithium carbonate 300 MG capsule, Take 1 capsule (300 mg total) by mouth in the morning AND 2 capsules (600 mg total) at bedtime.   Multiple Vitamin (QUINTABS) TABS, Take 1 tablet by mouth daily.   Multiple Vitamins-Minerals (ONE-A-DAY WOMENS PO), Take by mouth.   omeprazole (PRILOSEC) 20 MG capsule, Take 1 capsule (20 mg total) by mouth daily.   phenazopyridine (PYRIDIUM) 200  MG tablet, Take 1 tablet (200 mg total) by mouth 3 (three) times daily as needed for pain.   QUEtiapine (SEROQUEL) 100 MG tablet, Take 1 tablet (100 mg total) by mouth at bedtime.  Medical History:  Past Medical History:  Diagnosis Date   Abnormal Pap smear    cryo   Anemia    Asthma    as a child   Bipolar 1 disorder (HCC)    CHEST PAIN UNSPECIFIED 11/05/2008   Qualifier: Diagnosis of   By: York, LPN, Christine         Cyst of breast    left breast   DUB (dysfunctional uterine bleeding)    Fibroids    Headache(784.0)    Hypothyroidism    IBS (irritable bowel syndrome)    Knee pain, right 12/13/2014   No pathology MRI R knee 12/17/14      Pain in right elbow 05/07/2017   Thyroid dysfunction    Allergies:  Allergies  Allergen Reactions   Bee Venom Swelling and Anaphylaxis   Latex Swelling   Penicillins Swelling    Has patient had a PCN reaction causing immediate rash, facial/tongue/throat swelling, SOB or lightheadedness with hypotension: yes Has patient had a PCN reaction causing severe rash involving mucus membranes or skin necrosis: no Has patient had a PCN reaction that required hospitalization no Has patient had a PCN reaction occurring within the last 10 years: no If all of the above answers are "NO", then may proceed with Cephalosporin   Ibuprofen Swelling   Omega-3 Hives and Swelling    Potatoes     Other Hives and Swelling    Potatoes        Surgical History:  She  has a past surgical history that includes Cholecystectomy; Tubal ligation; Dilation and curettage of uterus; Abdominal hysterectomy (Bilateral, 12/25/2012); Bilateral salpingectomy (Bilateral, 12/25/2012); Oophorectomy (Bilateral, 12/25/2012); Cervical biopsy w/ loop electrode excision; Colonoscopy; and Upper gastrointestinal endoscopy. Family History:  Her family history includes Breast cancer in her maternal grandmother and paternal aunt; COPD in her mother; Colon cancer in an other family member;  Fibromyalgia in her mother; Heart disease in her father; Hypertension in her mother; Lung cancer in her father; Lung cancer (age of onset: 21) in  her paternal uncle; Ovarian cancer (age of onset: 64) in her mother; Rheum arthritis in her mother; Skin cancer in her maternal uncle; Sleep apnea in her mother; Thyroid disease in her mother.  REVIEW OF SYSTEMS  : All other systems reviewed and negative except where noted in the History of Present Illness.  PHYSICAL EXAM: LMP 11/29/2012  General Appearance: Well nourished, in no apparent distress. Head:   Normocephalic and atraumatic. Eyes:  sclerae anicteric,conjunctive pink  Respiratory: Respiratory effort normal, BS equal bilaterally without rales, rhonchi, wheezing. Cardio: RRR with no MRGs. Peripheral pulses intact.  Abdomen: Soft,  Obese ,active bowel sounds. mild tenderness in the epigastrium. Without guarding and Without rebound. No masses. Rectal: Not evaluated Musculoskeletal: Full ROM, Normal gait. Without edema. Skin:  Dry and intact without significant lesions or rashes Neuro: Alert and  oriented x4;  No focal deficits. Psych:  Cooperative. Normal mood and affect.    Doree Albee, PA-C 1:40 PM

## 2023-05-15 ENCOUNTER — Other Ambulatory Visit (HOSPITAL_COMMUNITY): Payer: Self-pay | Admitting: Psychiatry

## 2023-05-15 ENCOUNTER — Ambulatory Visit: Payer: Commercial Managed Care - HMO | Admitting: Physician Assistant

## 2023-05-15 DIAGNOSIS — F319 Bipolar disorder, unspecified: Secondary | ICD-10-CM

## 2023-05-20 ENCOUNTER — Other Ambulatory Visit (HOSPITAL_COMMUNITY): Payer: Commercial Managed Care - HMO

## 2023-05-20 DIAGNOSIS — N644 Mastodynia: Secondary | ICD-10-CM

## 2023-05-20 DIAGNOSIS — Z1231 Encounter for screening mammogram for malignant neoplasm of breast: Secondary | ICD-10-CM

## 2023-05-20 DIAGNOSIS — Z79899 Other long term (current) drug therapy: Secondary | ICD-10-CM

## 2023-05-20 DIAGNOSIS — E039 Hypothyroidism, unspecified: Secondary | ICD-10-CM

## 2023-05-20 NOTE — Progress Notes (Signed)
Pt tolerated injection wel

## 2023-05-21 ENCOUNTER — Ambulatory Visit: Payer: Self-pay

## 2023-05-21 ENCOUNTER — Other Ambulatory Visit: Payer: Self-pay | Admitting: Family Medicine

## 2023-05-21 DIAGNOSIS — Z1231 Encounter for screening mammogram for malignant neoplasm of breast: Secondary | ICD-10-CM

## 2023-05-21 DIAGNOSIS — N644 Mastodynia: Secondary | ICD-10-CM

## 2023-05-21 NOTE — Telephone Encounter (Signed)
Message from [place] L sent at 05/21/2023  1:54 PM EST  Summary: Swolleing & soreness in right breast   Pt requesting for provider to place orders for Diagnostic mammogram. Pt received the papers to do the mammogram but was informed pt needed to reach out to pcp. Pt also states she has been having swelling and soreness in right breast.  Please call patient back to discuss.         Chief Complaint: right breast swelling and soreness Symptoms: as above Frequency: 2 weeks  Pertinent Negatives: Patient denies redness, nipple discharge Disposition: [] ED /[] Urgent Care (no appt availability in office) / [] Appointment(In office/virtual)/ []  [facility]/ [] Home Care/ [] Refused Recommended Disposition /[] [facility]/ [x]  Follow-up with PCP Additional Notes: please advise when ordered  Reason for Disposition  [1] Breast pain AND [2] cause is not known  Answer Assessment - Initial Assessment Questions 1. SYMPTOM: "What's the main symptom you're concerned about?"  (e.g., lump, pain, rash, nipple discharge)     Swelling and soreness 2. LOCATION: "Where is the swelling and soreness located?"     Right side 3. ONSET: "When did swelling and soreness  start?"     Unknown ? 2 weeks  4. PRIOR HISTORY: "Do you have any history of prior problems with your breasts?" (e.g., lumps, cancer, fibrocystic breast disease)     Cluster of cysts near right nipple 5. CAUSE: "What do you think is causing this symptom?"     unsure 6. OTHER SYMPTOMS: "Do you have any other symptoms?" (e.g., fever, breast pain, redness or rash, nipple discharge)     Soreness mild  Protocols used: Breast Symptoms-A-AH

## 2023-05-22 ENCOUNTER — Other Ambulatory Visit: Payer: Self-pay | Admitting: Family Medicine

## 2023-05-22 DIAGNOSIS — N644 Mastodynia: Secondary | ICD-10-CM

## 2023-05-22 NOTE — Telephone Encounter (Signed)
Order has been placed.

## 2023-05-22 NOTE — Telephone Encounter (Signed)
Attempt to call and inform patient mammogram order was placed.  LVM to call back.

## 2023-05-22 NOTE — Addendum Note (Signed)
Addended by: Hoy Register on: 05/22/2023 12:46 PM   Modules accepted: Orders

## 2023-05-23 ENCOUNTER — Other Ambulatory Visit (HOSPITAL_COMMUNITY): Payer: Self-pay | Admitting: Psychiatry

## 2023-05-24 NOTE — Telephone Encounter (Signed)
Patient aware. Has appt scheduled already in January.

## 2023-05-30 LAB — LIPID PANEL
Chol/HDL Ratio: 3.2 {ratio} (ref 0.0–4.4)
Cholesterol, Total: 149 mg/dL (ref 100–199)
HDL: 46 mg/dL (ref >39)
LDL Chol Calc (NIH): 77 mg/dL (ref 0–99)
Triglycerides: 149 mg/dL (ref 0–149)
VLDL Cholesterol Cal: 26 mg/dL (ref 5–40)

## 2023-05-30 LAB — LITHIUM LEVEL: Lithium Lvl: 0.7 mmol/L (ref 0.5–1.2)

## 2023-05-30 LAB — SPECIMEN STATUS REPORT

## 2023-06-12 ENCOUNTER — Ambulatory Visit: Payer: Commercial Managed Care - HMO | Admitting: Family Medicine
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
## 2023-06-14 ENCOUNTER — Encounter (HOSPITAL_COMMUNITY): Payer: Self-pay

## 2023-06-14 ENCOUNTER — Telehealth (HOSPITAL_COMMUNITY): Payer: Self-pay | Admitting: Licensed Clinical Social Worker

## 2023-06-14 ENCOUNTER — Other Ambulatory Visit (HOSPITAL_COMMUNITY): Payer: Self-pay | Admitting: Psychiatry

## 2023-06-14 ENCOUNTER — Ambulatory Visit (HOSPITAL_COMMUNITY): Payer: Commercial Managed Care - HMO | Admitting: Licensed Clinical Social Worker

## 2023-06-14 DIAGNOSIS — F319 Bipolar disorder, unspecified: Secondary | ICD-10-CM

## 2023-06-14 NOTE — Telephone Encounter (Signed)
 LCSW sent to links to [phone] and sent 1 link to [phone] which are both numbers listed in epic.  LCSW followed up phone calls to both numbers.  Number ending in [phone] went  a Spanish-speaking voicemail and LCSW did not leave voicemail.  LCSW did follow-up with a phone call to [phone] number which also went to voicemail LCSW did leave HIPAA compliant voicemail on that phone number.  Patient to be marked as a no-show for January 10 appointment at 8 AM.  LCSW waited until 821 before disconnecting.

## 2023-06-17 ENCOUNTER — Encounter (HOSPITAL_COMMUNITY): Payer: Self-pay | Admitting: Psychiatry

## 2023-06-17 ENCOUNTER — Telehealth (HOSPITAL_COMMUNITY): Payer: Commercial Managed Care - HMO | Admitting: Psychiatry

## 2023-06-17 DIAGNOSIS — F411 Generalized anxiety disorder: Secondary | ICD-10-CM | POA: Diagnosis not present

## 2023-06-17 DIAGNOSIS — F319 Bipolar disorder, unspecified: Secondary | ICD-10-CM

## 2023-06-17 MED ORDER — HYDROXYZINE HCL 10 MG PO TABS: 5.0000 mg | ORAL_TABLET | Freq: Three times a day (TID) | ORAL | 2 refills | Status: DC | PRN

## 2023-06-17 MED ORDER — QUETIAPINE FUMARATE 100 MG PO TABS: 100.0000 mg | ORAL_TABLET | Freq: Every day | ORAL | 2 refills | Status: DC

## 2023-06-17 MED ORDER — BUPROPION HCL ER (XL) 300 MG PO TB24: 300.0000 mg | ORAL_TABLET | Freq: Every day | ORAL | 2 refills | Status: DC

## 2023-06-17 MED ORDER — LITHIUM CARBONATE 300 MG PO CAPS: ORAL_CAPSULE | ORAL | 1 refills | Status: DC

## 2023-06-17 NOTE — Patient Instructions (Signed)

## 2023-06-18 ENCOUNTER — Other Ambulatory Visit (HOSPITAL_COMMUNITY): Payer: Self-pay

## 2023-06-25 ENCOUNTER — Ambulatory Visit
Admission: RE | Admit: 2023-06-25 | Discharge: 2023-06-25 | Disposition: A | Discharge status: Home / Self Care | Payer: Medicaid Other | Source: Ambulatory Visit | Attending: Family Medicine | Admitting: Family Medicine

## 2023-06-25 ENCOUNTER — Ambulatory Visit
Admission: RE | Admit: 2023-06-25 | Discharge: 2023-06-25 | Disposition: A | Discharge status: Home / Self Care | Payer: Commercial Managed Care - HMO | Source: Ambulatory Visit | Attending: Family Medicine | Admitting: Family Medicine

## 2023-06-26 ENCOUNTER — Encounter: Payer: Self-pay | Admitting: Family Medicine

## 2023-07-05 ENCOUNTER — Other Ambulatory Visit: Payer: Self-pay | Admitting: Family Medicine

## 2023-07-05 DIAGNOSIS — E7800 Pure hypercholesterolemia, unspecified: Secondary | ICD-10-CM
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
## 2023-07-17 ENCOUNTER — Other Ambulatory Visit (HOSPITAL_COMMUNITY): Payer: Self-pay

## 2023-07-23 ENCOUNTER — Other Ambulatory Visit (HOSPITAL_COMMUNITY): Payer: Self-pay

## 2023-07-23 ENCOUNTER — Ambulatory Visit (INDEPENDENT_AMBULATORY_CARE_PROVIDER_SITE_OTHER): Payer: Commercial Managed Care - HMO | Admitting: Gastroenterology

## 2023-07-23 ENCOUNTER — Encounter: Payer: Self-pay | Admitting: Gastroenterology

## 2023-07-23 VITALS — BP 124/76 | HR 72 | Ht 63.5 in | Wt 208.1 lb

## 2023-07-23 DIAGNOSIS — R194 Change in bowel habit: Secondary | ICD-10-CM

## 2023-07-23 DIAGNOSIS — K648 Other hemorrhoids: Secondary | ICD-10-CM | POA: Diagnosis not present

## 2023-07-23 NOTE — Progress Notes (Signed)
[facility] Progress Note  Chief Complaint: Rectal bleeding  Subjective  Prior history  Screening colonoscopy with Dr. Orvan Falconer October 2023, poor prep EGD and colonoscopy with Dr. Myrtie Neither April 2024 for epigastric pain and diarrhea.  Normal EGD, duodenal biopsies negative for sprue, gastric biopsies negative for H. pylori. 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  Recommended 3-year surveillance colonoscopy interval, fecal elastase, follow-up with APP and trial of dicyclomine. Fecal elastase normal  _____________________________  Kristen Moore says her irregular bowel habit pattern continues, which she will periodically have constipation with a stool is difficult to pass and she will have to sit for a long time and strain.  Then she will finally have a bowel movement (on 1 occasion need to disimpact himself), and within a couple of days will have some rectal bleeding that has occurred more frequently in the last couple of months.  Other times she will have cramps and diarrhea if she consumes too much fatty food, something she is experienced since having her gallbladder removed.    ROS: Cardiovascular:  no chest pain Respiratory: no dyspnea  The patient's Past Medical, Family and Social History were reviewed and are on file in the EMR. Past Medical History:  Diagnosis Date   Abnormal Pap smear    cryo   Anemia    Asthma    as a child   Basal cell carcinoma    Bipolar 1 disorder (HCC)    CHEST PAIN UNSPECIFIED 11/05/2008   Qualifier: Diagnosis of   By: York, LPN, Christine         Cyst of breast    left breast   DUB (dysfunctional uterine bleeding)    Fibroids    Headache(784.0)    Hypothyroidism    IBS (irritable bowel syndrome)    Knee pain, right 12/13/2014   No pathology MRI R knee 12/17/14      Pain in right elbow 05/07/2017   Thyroid dysfunction     Past Surgical History:  Procedure Laterality Date   ABDOMINAL  HYSTERECTOMY Bilateral 12/25/2012   Procedure: HYSTERECTOMY ABDOMINAL;  Surgeon: Allie Bossier, MD;  Location: [facility];  Service: Gynecology;  Laterality: Bilateral;  TAH and bilateral salpingectomy   BILATERAL SALPINGECTOMY Bilateral 12/25/2012   Procedure: BILATERAL SALPINGECTOMY;  Surgeon: Allie Bossier, MD;  Location: [facility];  Service: Gynecology;  Laterality: Bilateral;   CERVICAL BIOPSY  W/ LOOP ELECTRODE EXCISION     CHOLECYSTECTOMY     COLONOSCOPY     10 + years ago   DILATION AND CURETTAGE OF UTERUS     OOPHORECTOMY Bilateral 12/25/2012   Procedure: OOPHORECTOMY;  Surgeon: Allie Bossier, MD;  Location: [facility];  Service: Gynecology;  Laterality: Bilateral;   TUBAL LIGATION     UPPER GASTROINTESTINAL ENDOSCOPY     10 + years     Objective:  Med list reviewed  Current Outpatient Medications:    atorvastatin (LIPITOR) 40 MG tablet, Take 1 tablet (40 mg total) by mouth daily., Disp: 90 tablet, Rfl: 1   buPROPion (WELLBUTRIN XL) 300 MG 24 hr tablet, Take 1 tablet (300 mg total) by mouth daily., Disp: 30 tablet, Rfl: 2   dicyclomine (BENTYL) 10 MG capsule, Take 1 tablet (10 mg total) by mouth 2-3 times daily as needed for abdominal cramps and diarrhea (Patient taking differently: as needed. Take 1 tablet (10 mg total) by mouth 2-3 times daily as needed for abdominal cramps and diarrhea), Disp:  180 capsule, Rfl: 1   hydrOXYzine (ATARAX) 10 MG tablet, Take 0.5-1 tablets (5-10 mg total) by mouth 3 (three) times daily as needed for anxiety (sleep)., Disp: 60 tablet, Rfl: 2   levothyroxine (SYNTHROID) 137 MCG tablet, Take 1 tablet (137 mcg total) by mouth daily., Disp: 90 tablet, Rfl: 1   lithium carbonate 300 MG capsule, Take 1 capsule (300 mg total) by mouth in the morning AND 2 capsules (600 mg total) at bedtime., Disp: 270 capsule, Rfl: 1   Multiple Vitamins-Minerals (ONE-A-DAY WOMENS PO), Take by mouth., Disp: , Rfl:    QUEtiapine (SEROQUEL) 100 MG tablet, Take 1 tablet (100 mg total) by  mouth at bedtime., Disp: 30 tablet, Rfl: 2   Vital signs in last 24 hrs: Vitals:   07/23/23 1356  BP: 124/76  Pulse: 72   Wt Readings from Last 3 Encounters:  07/23/23 208 lb 2 oz (94.4 kg)  04/17/23 198 lb (89.8 kg)  03/08/23 198 lb 6.4 oz (90 kg)    Physical Exam  (MA Bronwen Betters present for entire exam) HEENT: sclera anicteric, oral mucosa moist without lesions Neck: supple, no thyromegaly, JVD or lymphadenopathy Cardiac: Regular without appreciable murmur,  no peripheral edema Pulm: clear to auscultation bilaterally, normal RR and effort noted Abdomen: soft, no tenderness, with active bowel sounds. No guarding or palpable hepatosplenomegaly. Perianal exam normal.  DRE normal without tenderness, fissure or probable internal lesion Anoscopy small internal hemorrhoids, primarily right-sided   Labs:   ___________________________________________ Radiologic studies:   ____________________________________________ Other:   _____________________________________________   Encounter Diagnoses  Name Primary?   Bleeding internal hemorrhoids Yes   Altered bowel habits     Her bleeding is hemorrhoidal, though hemorrhoids are quite small on exam.  They are probably becoming more pronounced episodically when she has episodes of constipation as described above, then the bleeding is happening within a couple days afterwards. It may be difficult irregular bowel habits given some dietary triggers as described above and some suspected IBS.  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   However, we should try her best to treat the constipation before considering hemorrhoidal banding given what what she is describing and what I am seeing on exam today.  Recommend tablespoon daily of Citrucel OTC magnesium  supplement 400 milligrams at bedtime Chronic audio similar As Preparation H suppository for bleeding Return as needed (Banding brochure given)  Charlie Pitter III

## 2023-07-23 NOTE — Patient Instructions (Addendum)
Please purchase the following medications over the counter and take as directed: Citrucel- one tablespoon daily Magnesium- 400mg  at bedtime   Squatty potty Prep h suppository's- as needed.    _______________________________________________________  If your blood pressure at your visit was 140/90 or greater, please contact your primary care physician to follow up on this.  _______________________________________________________  If you are age 51 or older, your body mass index should be between 23-30. Your Body mass index is 36.29 kg/m. If this is out of the aforementioned range listed, please consider follow up with your Primary Care Provider.  If you are age 38 or younger, your body mass index should be between 19-25. Your Body mass index is 36.29 kg/m. If this is out of the aformentioned range listed, please consider follow up with your Primary Care Provider.   ________________________________________________________  The  GI providers would like to encourage you to use [facility] to communicate with providers for non-urgent requests or questions.  Due to long hold times on the telephone, sending your provider a message by [facility] may be a faster and more efficient way to get a response.  Please allow 48 business hours for a response.  Please remember that this is for non-urgent requests.  _______________________________________________________ It was a pleasure to see you today!  Thank you for trusting me with your gastrointestinal care!

## 2023-07-25 ENCOUNTER — Other Ambulatory Visit (HOSPITAL_COMMUNITY): Payer: Self-pay

## 2023-08-07 ENCOUNTER — Ambulatory Visit: Payer: Commercial Managed Care - HMO | Admitting: Family Medicine

## 2023-08-08 ENCOUNTER — Other Ambulatory Visit (HOSPITAL_COMMUNITY): Payer: Self-pay | Admitting: Psychiatry

## 2023-08-08 ENCOUNTER — Other Ambulatory Visit: Payer: Self-pay | Admitting: Family Medicine

## 2023-08-08 DIAGNOSIS — F319 Bipolar disorder, unspecified: Secondary | ICD-10-CM

## 2023-08-08 DIAGNOSIS — E039 Hypothyroidism, unspecified: Secondary | ICD-10-CM

## 2023-08-08 DIAGNOSIS — E7800 Pure hypercholesterolemia, unspecified: Secondary | ICD-10-CM

## 2023-08-08 MED ORDER — LEVOTHYROXINE SODIUM 137 MCG PO TABS
137.0000 ug | ORAL_TABLET | Freq: Every day | ORAL | 0 refills | Status: DC
Start: 2023-08-08 — End: 2023-09-06

## 2023-08-08 NOTE — Telephone Encounter (Signed)
 Copied from CRM [id number]. Topic: Clinical - Medication Refill >> Aug 08, 2023 12:15 PM Carlatta H wrote: Most Recent Primary Care Visit:  Provider: Bertram Denver W  Department[facility]  Visit Type: OFFICE VISIT  Date: 03/08/2023  Medication: Rx #: [id number]  levothyroxine (SYNTHROID) 137 MCG tablet [[id number]]  atorvastatin (LIPITOR) 40 MG tablet [[id number]]    Has the patient contacted their pharmacy? Yes (Agent: If no, request that the patient contact the pharmacy for the refill. If patient does not wish to contact the pharmacy document the reason why and proceed with request.) (Agent: If yes, when and what did the pharmacy advise?)Advised to call office  Is this the correct pharmacy for this prescription? Yes If no, delete pharmacy and type the correct one.  This is the patient's preferred pharmacy:  [facility] [phone] [place] Otto, [place] - [address] [place] [address] Phone: [phone] Fax: [phone]   Has the prescription been filled recently? No  Is the patient out of the medication? Yes  Has the patient been seen for an appointment in the last year OR does the patient have an upcoming appointment? Yes  Can we respond through MyChart? Yes  Agent: Please be advised that Rx refills may take up to 3 business days. We ask that you follow-up with your pharmacy.
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
## 2023-08-15 ENCOUNTER — Ambulatory Visit: Admitting: Physician Assistant

## 2023-08-15 NOTE — Progress Notes (Signed)
 Patient did not connect for virtual psychiatric medication management appointment on 08/19/23 at [facility]. Sent secure video link with no response. Called phone upon which Spanish-speaking individual answered and then hung up the call. Front desk to send patient MyChart message to obtain updated number.  Daine Gip, MD 08/19/23

## 2023-08-19 ENCOUNTER — Encounter (HOSPITAL_COMMUNITY): Payer: Self-pay

## 2023-08-19 ENCOUNTER — Encounter (HOSPITAL_COMMUNITY): Payer: Commercial Managed Care - HMO | Admitting: Psychiatry

## 2023-08-21 ENCOUNTER — Encounter: Admitting: Family

## 2023-08-21 ENCOUNTER — Other Ambulatory Visit (INDEPENDENT_AMBULATORY_CARE_PROVIDER_SITE_OTHER): Payer: Self-pay

## 2023-08-21 ENCOUNTER — Other Ambulatory Visit: Payer: Self-pay | Admitting: Family

## 2023-08-21 DIAGNOSIS — R0989 Other specified symptoms and signs involving the circulatory and respiratory systems: Secondary | ICD-10-CM | POA: Diagnosis not present

## 2023-08-21 DIAGNOSIS — H9202 Otalgia, left ear: Secondary | ICD-10-CM

## 2023-08-21 DIAGNOSIS — Z1152 Encounter for screening for COVID-19: Secondary | ICD-10-CM

## 2023-08-21 DIAGNOSIS — Z112 Encounter for screening for other bacterial diseases: Secondary | ICD-10-CM

## 2023-08-21 LAB — POCT RAPID STREP A (OFFICE): Rapid Strep A Screen: NEGATIVE

## 2023-08-21 MED ORDER — DOXYCYCLINE HYCLATE 100 MG PO TABS: 100.0000 mg | ORAL_TABLET | Freq: Two times a day (BID) | ORAL | 0 refills | Status: AC
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
## 2023-08-21 NOTE — Progress Notes (Signed)
 Erroneous encounter-disregard

## 2023-08-22 ENCOUNTER — Encounter: Payer: Self-pay | Admitting: Family

## 2023-08-22 LAB — COVID-19, FLU A+B AND RSV
Influenza A, NAA: NOT DETECTED
Influenza B, NAA: NOT DETECTED
RSV, NAA: NOT DETECTED
SARS-CoV-2, NAA: NOT DETECTED

## 2023-08-22 NOTE — Progress Notes (Addendum)
 Patient ID: Kristen Moore, female    DOB: [birth year]  MRN: [id number]  CC: Ear Pain  Subjective: Kristen Moore is a 53 y.o. female who presents for ear pain.   Her concerns today include:  - Left ear pain radiating to left jaw/left side of head. Denies red flag symptoms. Worse during nighttime. Using over-the-counter ear drops with minimal relief. - Sore throat. Denies red flag symptoms.  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   Bilateral lower extremity edema 08/12/2017   Transition of care performed with sharing of clinical summary 08/12/2017   Chronic right shoulder pain 07/09/2017   Decreased range of motion of right shoulder 06/13/2017   Muscle weakness of right arm 06/13/2017   Neck pain 06/13/2017   Poor posture 06/13/2017   Asymptomatic menopausal state 05/06/2017   Localized edema 05/06/2017   Pure hypercholesterolemia 05/06/2017   Sprain of left foot 12/20/2016   Rotator cuff impingement syndrome 09/03/2016   Decreased vision in both eyes 06/05/2016   Rotator cuff impingement syndrome of right shoulder 01/16/2016   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   IBS (irritable bowel syndrome) 10/21/2015   Dyshidrotic hand dermatitis 10/21/2015   Dyshidrotic foot dermatitis 10/21/2015   Bright red blood per rectum 10/21/2015   S/P bilateral oophorectomy 10/21/2015   Hypothyroidism 10/21/2015   HLD (hyperlipidemia) 03/18/2015   Pelvic pain 12/25/2012   ELECTROCARDIOGRAM, ABNORMAL 11/05/2008     Current Outpatient Medications on File Prior to Visit  Medication Sig Dispense  Refill   atorvastatin (LIPITOR) 40 MG tablet TAKE 1 TABLET BY MOUTH EVERY DAY 90 tablet 0   buPROPion (WELLBUTRIN XL) 300 MG 24 hr tablet Take 1 tablet (300 mg total) by mouth daily. 30 tablet 2   hydrOXYzine (ATARAX) 10 MG tablet Take 0.5-1 tablets (5-10 mg total) by mouth 3 (three) times daily as needed for anxiety (sleep). 60 tablet 2   levothyroxine (SYNTHROID) 137 MCG tablet Take 1 tablet (137 mcg total) by mouth daily. 90 tablet 0   lithium carbonate 300 MG capsule TAKE 1 CAPSULE BY MOUTH EVERY MORNING & TAKE 2 CAPSULES EVERY NIGHT AT BEDTIME 90 capsule 1   Multiple Vitamins-Minerals (ONE-A-DAY WOMENS PO) Take by mouth.     QUEtiapine (SEROQUEL) 100 MG tablet Take 1 tablet (100 mg total) by mouth at bedtime. 30 tablet 2   dicyclomine (BENTYL) 10 MG capsule Take 1 tablet (10 mg total) by mouth 2-3 times daily as needed for abdominal cramps and diarrhea (Patient not taking: Reported on 08/21/2023) 180 capsule 1   No current facility-administered medications on file prior to visit.    Allergies  Allergen Reactions   Bee Venom Swelling and Anaphylaxis   Latex Swelling   Penicillins Swelling    Has patient had a PCN reaction causing immediate rash, facial/tongue/throat swelling, SOB or lightheadedness with hypotension: yes Has patient had a PCN reaction causing severe rash involving mucus membranes or skin necrosis: no Has patient had a PCN reaction that required hospitalization no Has patient had a PCN reaction occurring within the last 10 years: no If all of the above answers are "NO", then may proceed with Cephalosporin  Ibuprofen Swelling   Omega-3 Hives and Swelling    Potatoes     Other Hives and Swelling    Potatoes       Social History   Socioeconomic History   Marital status: Single    Spouse name: Not on file   Number of children: 1   Years of education: Not on file   Highest education level: Associate degree: occupational, Scientist, product/process development, or vocational program   Occupational History   Not on file  Tobacco Use   Smoking status: Every Day    Current packs/day: 0.50    Average packs/day: 0.5 packs/day for 9.0 years (4.5 ttl pk-yrs)    Types: Cigarettes   Smokeless tobacco: Never  Vaping Use   Vaping status: Never Used  Substance and Sexual Activity   Alcohol use: No    Alcohol/week: 0.0 standard drinks of alcohol   Drug use: No   Sexual activity: Not Currently    Birth control/protection: Surgical  Other Topics Concern   Not on file  Social History Narrative   Mother has ovarian cancer that has spread to lungs (passed April 2023), Dad has lung cancer that has spread to kidneys, and disabled brother all live in home with patient. Patient is primary caregiver. Daughter is drug addict, and has loss custody of patient's infant granddaughter in 02/2020. Patient works for a Estate manager/land agent hospital buildings. Patient is very stressed and saddened about her current situation.    Social Drivers of Corporate investment banker Strain: High Risk (03/08/2023)   Overall Financial Resource Strain (CARDIA)    Difficulty of Paying Living Expenses: Very hard  Food Insecurity: No Food Insecurity (03/08/2023)   Hunger Vital Sign    Worried About Running Out of Food in the Last Year: Never true    Ran Out of Food in the Last Year: Never true  Transportation Needs: No Transportation Needs (03/08/2023)   PRAPARE - Administrator, Civil Service (Medical): No    Lack of Transportation (Non-Medical): No  Physical Activity: Sufficiently Active (03/08/2023)   Exercise Vital Sign    Days of Exercise per Week: 7 days    Minutes of Exercise per Session: 30 min  Stress: Stress Concern Present (03/08/2023)   [employer] of Occupational Health - Occupational Stress Questionnaire    Feeling of Stress : To some extent  Social Connections: Socially Isolated (03/08/2023)   Social Connection and Isolation Panel [NHANES]    Frequency of Communication with  Friends and Family: More than three times a week    Frequency of Social Gatherings with Friends and Family: Never    Attends Religious Services: Never    Database administrator or Organizations: No    Attends Banker Meetings: Never    Marital Status: Divorced  Catering manager Violence: Not At Risk (03/08/2023)   Humiliation, Afraid, Rape, and Kick questionnaire    Fear of Current or Ex-Partner: No    Emotionally Abused: No    Physically Abused: No    Sexually Abused: No    Family History  Problem Relation Age of Onset   Hypertension Mother    Rheum arthritis Mother    Fibromyalgia Mother    COPD Mother    Sleep apnea Mother    Thyroid disease Mother    Ovarian cancer Mother 23   Heart disease Father    Lung cancer Father        dx 48s   Skin cancer Maternal Uncle  melanoma on back   Breast cancer Paternal Aunt        dx 36s   Lung cancer Paternal Uncle 44       mets to brain   Breast cancer Maternal Grandmother        dx 50s   Colon cancer Other        MGF's brother; dx 77s   Esophageal cancer Neg Hx    Stomach cancer Neg Hx     Past Surgical History:  Procedure Laterality Date   ABDOMINAL HYSTERECTOMY Bilateral 12/25/2012   Procedure: HYSTERECTOMY ABDOMINAL;  Surgeon: Allie Bossier, MD;  Location: [facility];  Service: Gynecology;  Laterality: Bilateral;  TAH and bilateral salpingectomy   BILATERAL SALPINGECTOMY Bilateral 12/25/2012   Procedure: BILATERAL SALPINGECTOMY;  Surgeon: Allie Bossier, MD;  Location: [facility];  Service: Gynecology;  Laterality: Bilateral;   CERVICAL BIOPSY  W/ LOOP ELECTRODE EXCISION     CHOLECYSTECTOMY     COLONOSCOPY     10 + years ago   DILATION AND CURETTAGE OF UTERUS     OOPHORECTOMY Bilateral 12/25/2012   Procedure: OOPHORECTOMY;  Surgeon: Allie Bossier, MD;  Location: [facility];  Service: Gynecology;  Laterality: Bilateral;   TUBAL LIGATION     UPPER GASTROINTESTINAL ENDOSCOPY     10 + years    ROS: Review of  Systems Negative except as stated above  PHYSICAL EXAM: BP 125/80   Pulse (!) 58   Temp 98.7 F (37.1 C) (Oral)   Ht 5\' 3"  (1.6 m)   Wt 210 lb 9.6 oz (95.5 kg)   LMP 11/29/2012   SpO2 93%   BMI 37.31 kg/m   Physical Exam HENT:     Head: Normocephalic and atraumatic.     Right Ear: Tympanic membrane, ear canal and external ear normal.     Left Ear: Tympanic membrane, ear canal and external ear normal.     Nose: Nose normal.     Mouth/Throat:     Mouth: Mucous membranes are moist.     Pharynx: Oropharynx is clear.  Eyes:     Extraocular Movements: Extraocular movements intact.     Conjunctiva/sclera: Conjunctivae normal.     Pupils: Pupils are equal, round, and reactive to light.  Cardiovascular:     Rate and Rhythm: Bradycardia present.     Pulses: Normal pulses.     Heart sounds: Normal heart sounds.  Pulmonary:     Effort: Pulmonary effort is normal.     Breath sounds: Normal breath sounds.  Musculoskeletal:        General: Normal range of motion.     Cervical back: Normal range of motion and neck supple.  Neurological:     General: No focal deficit present.     Mental Status: She is alert and oriented to person, place, and time.  Psychiatric:        Mood and Affect: Mood normal.        Behavior: Behavior normal.      ASSESSMENT AND PLAN: Left ear pain - Doxycycline as prescribed. Counseled on medication adherence/adverse effects.  - Follow-up with primary provider as scheduled.   Upper respiratory symptom - Patient today in office with no cardiopulmonary/acute distress.  - Routine screening.     Patient was given the opportunity to ask questions.  Patient verbalized understanding of the plan and was able to repeat key elements of the plan. Patient was given clear instructions to go to Emergency Department or return  to medical center if symptoms don't improve, worsen, or new problems develop.The patient verbalized understanding.  Follow-up with primary  provider as scheduled.  Rema Fendt, NP

## 2023-08-23 LAB — CULTURE, GROUP A STREP: Strep A Culture: NEGATIVE

## 2023-08-28 ENCOUNTER — Ambulatory Visit: Admitting: Family Medicine

## 2023-08-28 ENCOUNTER — Ambulatory Visit: Payer: Self-pay

## 2023-08-28 NOTE — Telephone Encounter (Addendum)
  Chief Complaint: Elbow pain Symptoms: pain Frequency: constant Pertinent Negatives: Patient denies fever Disposition: [] ED /[] Urgent Care (no appt availability in office) / [x] Appointment(In office/virtual)/ []  [facility]/ [] Home Care/ [] Refused Recommended Disposition /[] [facility]/ []  Follow-up with PCP Additional Notes:  No injury. Right elbow pain started 3 days ago and intense. She is having trouble gripping. Difficulty with ROM. This is effecting her work. Tylenol has been ineffective. No other symptoms. While scheduling acute visit our call was disconnected, this writer was unable to reconnect with patient. When she calls back please verify with her that scheduled appointment today at 310 works for her, I was not able to confirm with her. 2nd attempt to reach patient on her home phone [phone], the person who answered stated this is an incorrect number. Routing for follow up.       Copied from CRM [id number]. Topic: Clinical - Red Word Triage >> Aug 28, 2023 12:32 PM Abundio Miu S wrote: [facility] that prompted transfer to Nurse Triage: severe pain-rt elbow Reason for Disposition  [1] MODERATE pain (e.g., interferes with normal activities) AND [2] present > 3 days  Protocols used: Elbow Pain-A-AH

## 2023-08-30 ENCOUNTER — Ambulatory Visit: Payer: Self-pay | Admitting: Family Medicine

## 2023-08-30 NOTE — Telephone Encounter (Signed)
 Right elbow pain (10/10) constant since Saturday Symptoms: Intermittent numbness in both hands for over a month (current numbness in left hand), lack of good grip in right hand, swelling  Disposition: [x] ED [x] Refused Recommended   Disposition Additional Notes: Pt states she thinks she has "tennis elbow." This RN advised pt to go to ED but pt declined as she has work. This RN notified [facility] of pt ED decline. Pt states she will go to urgent care tmrw.    Copied from CRM [id number]. Topic: Clinical - Red Word Triage >> Aug 30, 2023 11:49 AM Alessandra Bevels wrote: Red Word that prompted transfer to Nurse Triage: Patient is calling to report pain in right elbow. Reporting new injury.  Reporting tynelol & cold compress. And using heat Pad. Please advise Reason for Disposition  [1] Numbness (i.e., loss of sensation) in fingers AND [2] present now  Answer Assessment - Initial Assessment Questions Right elbow pain (10/10) constant since Saturday Symptoms: Intermittent numbness in both hands for over a month, lack of good grip in right hand, swelling  Protocols used: Elbow Injury-A-AH

## 2023-09-01 ENCOUNTER — Ambulatory Visit
Admission: EM | Admit: 2023-09-01 | Discharge: 2023-09-01 | Disposition: A | Discharge status: Home / Self Care | Attending: Physician Assistant | Admitting: Physician Assistant

## 2023-09-01 DIAGNOSIS — M7711 Lateral epicondylitis, right elbow: Secondary | ICD-10-CM | POA: Diagnosis not present

## 2023-09-01 MED ORDER — PREDNISONE 20 MG PO TABS: 40.0000 mg | ORAL_TABLET | Freq: Every day | ORAL | 0 refills | Status: AC

## 2023-09-01 NOTE — ED Triage Notes (Signed)
"  Since Saturday my right arm has been sore (all over) and kills me around right elbow and right hand when squeezing anything". "Numbness mostly in right but occasionally in both hands". "I do a lot of repetitive work in a [employer]i, so unsure if it is related".

## 2023-09-01 NOTE — ED Provider Notes (Signed)
 [facility]    CSN: [id number] Arrival date & time: 09/01/23  1421      History   Chief Complaint Chief Complaint  Patient presents with   Arm Pain    HPI Kristen Moore is a 53 y.o. female.   Patient here today for evaluation of right arm pain that started a couple days ago.  She reports that she has pain with squeezing her hand and gripping things with her right hand.  She also reports some occasional tingling and numbness.  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  She denies any injury otherwise.  Pain is primarily located to the lateral epicondyle.  She has tried over-the-counter medication without resolution.  The history is provided by the patient.  Arm Pain Pertinent negatives include no abdominal pain and no shortness of breath.    Past Medical History:  Diagnosis Date   Abnormal Pap smear    cryo   Anemia    Asthma    as a child   Basal cell carcinoma    Bipolar 1 disorder (HCC)    CHEST PAIN UNSPECIFIED 11/05/2008   Qualifier: Diagnosis of   By: York, LPN, Christine         Cyst of breast    left breast   DUB (dysfunctional uterine bleeding)    Fibroids    Headache(784.0)    Hypothyroidism    IBS (irritable bowel syndrome)    Knee pain, right 12/13/2014   No pathology MRI R knee 12/17/14      Pain in right elbow 05/07/2017   Thyroid dysfunction     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   Bilateral lower extremity edema 08/12/2017   Transition of care performed with sharing of clinical summary 08/12/2017   Chronic right  shoulder pain 07/09/2017   Decreased range of motion of right shoulder 06/13/2017   Muscle weakness of right arm 06/13/2017   Neck pain 06/13/2017   Poor posture 06/13/2017   Asymptomatic menopausal state 05/06/2017   Localized edema 05/06/2017   Pure hypercholesterolemia 05/06/2017   Sprain of left foot 12/20/2016   Rotator cuff impingement syndrome 09/03/2016   Decreased vision in both eyes 06/05/2016   Rotator cuff impingement syndrome of right shoulder 01/16/2016   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   IBS (irritable bowel syndrome) 10/21/2015   Dyshidrotic hand dermatitis 10/21/2015   Dyshidrotic foot dermatitis 10/21/2015   Bright red blood per rectum 10/21/2015   S/P bilateral oophorectomy 10/21/2015   Hypothyroidism 10/21/2015   HLD (hyperlipidemia) 03/18/2015   Pelvic pain 12/25/2012   ELECTROCARDIOGRAM, ABNORMAL 11/05/2008    Past Surgical History:  Procedure Laterality Date   ABDOMINAL HYSTERECTOMY Bilateral 12/25/2012   Procedure: HYSTERECTOMY ABDOMINAL;  Surgeon: Allie Bossier, MD;  Location: [facility];  Service: Gynecology;  Laterality: Bilateral;  TAH and bilateral salpingectomy   BILATERAL SALPINGECTOMY Bilateral 12/25/2012   Procedure: BILATERAL SALPINGECTOMY;  Surgeon: Allie Bossier, MD;  Location: [facility];  Service: Gynecology;  Laterality: Bilateral;   CERVICAL BIOPSY  W/ LOOP ELECTRODE EXCISION     CHOLECYSTECTOMY  COLONOSCOPY     10 + years ago   DILATION AND CURETTAGE OF UTERUS     OOPHORECTOMY Bilateral 12/25/2012   Procedure: OOPHORECTOMY;  Surgeon: Allie Bossier, MD;  Location: [facility];  Service: Gynecology;  Laterality: Bilateral;   TUBAL LIGATION     UPPER GASTROINTESTINAL ENDOSCOPY     10 + years    OB History     Gravida  3   Para  2   Term  1   Preterm  1   AB  1   Living  1      SAB      IAB  1   Ectopic      Multiple      Live Births               Home Medications    Prior to Admission medications   Medication  Sig Start Date End Date Taking? Authorizing Provider  atorvastatin (LIPITOR) 40 MG tablet TAKE 1 TABLET BY MOUTH EVERY DAY 08/08/23  Yes Newlin, Enobong, MD  buPROPion (WELLBUTRIN XL) 300 MG 24 hr tablet Take 1 tablet (300 mg total) by mouth daily. 06/17/23 09/15/23 Yes Bahraini, Sarah A  dicyclomine (BENTYL) 10 MG capsule Take 1 tablet (10 mg total) by mouth 2-3 times daily as needed for abdominal cramps and diarrhea 10/11/22  Yes Danis, Andreas Blower, MD  hydrOXYzine (ATARAX) 10 MG tablet Take 0.5-1 tablets (5-10 mg total) by mouth 3 (three) times daily as needed for anxiety (sleep). 06/17/23  Yes Bahraini, Shawn Route  levothyroxine (SYNTHROID) 137 MCG tablet Take 1 tablet (137 mcg total) by mouth daily. 08/08/23  Yes Hoy Register, MD  lithium carbonate 300 MG capsule TAKE 1 CAPSULE BY MOUTH EVERY MORNING & TAKE 2 CAPSULES EVERY NIGHT AT BEDTIME 08/08/23  Yes Bahraini, Sarah A  Multiple Vitamins-Minerals (ONE-A-DAY WOMENS PO) Take by mouth.   Yes [provider]  predniSONE (DELTASONE) 20 MG tablet Take 2 tablets (40 mg total) by mouth daily with breakfast for 5 days. 09/01/23 09/06/23 Yes Tomi Bamberger, PA-C  QUEtiapine (SEROQUEL) 100 MG tablet Take 1 tablet (100 mg total) by mouth at bedtime. 06/17/23  Yes Bahraini, Sarah A    Family History Family History  Problem Relation Age of Onset   Hypertension Mother    Rheum arthritis Mother    Fibromyalgia Mother    COPD Mother    Sleep apnea Mother    Thyroid disease Mother    Ovarian cancer Mother 42   Heart disease Father    Lung cancer Father        dx 36s   Breast cancer Maternal Grandmother        dx 32s   Skin cancer Maternal Uncle        melanoma on back   Breast cancer Paternal Aunt        dx 64s   Lung cancer Paternal Uncle 44       mets to brain   Colon cancer Other        MGF's brother; dx 61s   Esophageal cancer Neg Hx    Stomach cancer Neg Hx     Social History Social History   Tobacco Use   Smoking status: Every Day     Current packs/day: 0.50    Average packs/day: 0.5 packs/day for 9.0 years (4.5 ttl pk-yrs)    Types: Cigarettes   Smokeless tobacco: Never  Vaping Use   Vaping status: Never Used  Substance Use Topics   Alcohol use: No    Alcohol/week: 0.0 standard drinks of alcohol   Drug use: No     Allergies   Bee venom, Ibuprofen, Latex, Omega-3, Other, and Penicillins   Review of Systems Review of Systems  Constitutional:  Negative for chills and fever.  Eyes:  Negative for discharge and redness.  Respiratory:  Negative for shortness of breath.   Gastrointestinal:  Negative for abdominal pain, nausea and vomiting.  Musculoskeletal:  Positive for arthralgias and myalgias. Negative for joint swelling.     Physical Exam Triage Vital Signs ED Triage Vitals  Encounter Vitals Group     BP 09/01/23 1432 111/75     Systolic BP Percentile --      Diastolic BP Percentile --      Pulse Rate 09/01/23 1432 60     Resp 09/01/23 1432 18     Temp 09/01/23 1432 (!) 97.5 F (36.4 C)     Temp Source 09/01/23 1432 Oral     SpO2 09/01/23 1432 96 %     Weight 09/01/23 1429 200 lb (90.7 kg)     Height 09/01/23 1429 5\' 3"  (1.6 m)     Head Circumference --      Peak Flow --      Pain Score 09/01/23 1425 8     Pain Loc --      Pain Education --      Exclude from Growth Chart --    No data found.  Updated Vital Signs BP 111/75 (BP Location: Left Arm)   Pulse 60   Temp (!) 97.5 F (36.4 C) (Oral)   Resp 18   Ht 5\' 3"  (1.6 m)   Wt 200 lb (90.7 kg)   LMP 11/29/2012   SpO2 96%   BMI 35.43 kg/m   Visual Acuity Right Eye Distance:   Left Eye Distance:   Bilateral Distance:    Right Eye Near:   Left Eye Near:    Bilateral Near:     Physical Exam Vitals and nursing note reviewed.  Constitutional:      General: She is not in acute distress.    Appearance: Normal appearance. She is not ill-appearing.  HENT:     Head: Normocephalic and atraumatic.  Eyes:     Conjunctiva/sclera:  Conjunctivae normal.  Cardiovascular:     Rate and Rhythm: Normal rate.  Pulmonary:     Effort: Pulmonary effort is normal. No respiratory distress.  Musculoskeletal:     Comments: Full range of motion of right elbow and wrist.  Mild tenderness noted to the lateral epicondyle.  No apparent swelling of right elbow or wrist.  Skin:    Capillary Refill: Normal cap refill to right fingers Neurological:     Mental Status: She is alert.     Comments: Gross sensation intact in distal right fingers  Psychiatric:        Mood and Affect: Mood normal.        Behavior: Behavior normal.        Thought Content: Thought content normal.      UC Treatments / Results  Labs (all labs ordered are listed, but only abnormal results are displayed) Labs Reviewed - No data to display  EKG   Radiology No results found.  Procedures Procedures (including critical care time)  Medications Ordered in UC Medications - No data to display  Initial Impression / Assessment and Plan / UC Course  I have reviewed the triage  vital signs and the nursing notes.  Pertinent labs & imaging results that were available during my care of the patient were reviewed by me and considered in my medical decision making (see chart for details).    Suspect lateral epicondylitis and recommended she buy a brace over-the-counter for same.  Will treat with steroid burst and advised if no gradual improvement that she follow-up with Ortho.  Patient expresses understanding.  Final Clinical Impressions(s) / UC Diagnoses   Final diagnoses:  Lateral epicondylitis of right elbow   Discharge Instructions   None    ED Prescriptions     Medication Sig Dispense Auth. Provider   predniSONE (DELTASONE) 20 MG tablet Take 2 tablets (40 mg total) by mouth daily with breakfast for 5 days. 10 tablet Tomi Bamberger, PA-C      PDMP not reviewed this encounter.   Tomi Bamberger, PA-C 09/01/23 1538

## 2023-09-06 ENCOUNTER — Other Ambulatory Visit: Payer: Self-pay | Admitting: Family Medicine

## 2023-09-06 DIAGNOSIS — E039 Hypothyroidism, unspecified: Secondary | ICD-10-CM

## 2023-09-09 ENCOUNTER — Telehealth (HOSPITAL_COMMUNITY): Payer: Self-pay | Admitting: *Deleted

## 2023-09-09 NOTE — Telephone Encounter (Signed)
 PT called this morning and LVM requesting seroquel be sent to the [facility] on [address] - PT has appt scheduled for June 9th with provider Bah[place]i    Screened by Nursing staff !      JNL

## 2023-09-10 MED ORDER — QUETIAPINE FUMARATE 100 MG PO TABS: 100.0000 mg | ORAL_TABLET | Freq: Every day | ORAL | 2 refills | Status: DC

## 2023-09-10 NOTE — Addendum Note (Signed)
 Addended by: Theodoro Kos A on: 09/10/2023 09:08 AM   Modules accepted: Orders

## 2023-09-20 ENCOUNTER — Other Ambulatory Visit: Payer: Self-pay | Admitting: Family Medicine

## 2023-09-20 DIAGNOSIS — E7800 Pure hypercholesterolemia, unspecified: Secondary | ICD-10-CM

## 2023-09-20 DIAGNOSIS — E039 Hypothyroidism, unspecified: Secondary | ICD-10-CM

## 2023-09-26 ENCOUNTER — Telehealth: Payer: Self-pay | Admitting: Family Medicine

## 2023-09-26 NOTE — Telephone Encounter (Signed)
 Attempted to contact patient to schedule virtual visit with provider to discuss concerns. VM was in spanish not sure if correct number is on file.

## 2023-09-26 NOTE — Telephone Encounter (Signed)
 Please Advise Copied from CRM [id number]. Topic: Referral - Question  >> Sep 26, 2023  2:09 PM Kristen Moore wrote:  Reason for CRM: Patient states she is still in pain in her arm and she is not able to get any help from any attempted providers, she is now wanting to know about possible referrals to other locations Please call to advise.

## 2023-10-22 ENCOUNTER — Other Ambulatory Visit: Payer: Self-pay | Admitting: Family Medicine

## 2023-10-22 DIAGNOSIS — E7800 Pure hypercholesterolemia, unspecified: Secondary | ICD-10-CM

## 2023-10-22 DIAGNOSIS — E039 Hypothyroidism, unspecified: Secondary | ICD-10-CM

## 2023-10-25 ENCOUNTER — Ambulatory Visit: Attending: Nurse Practitioner | Admitting: Nurse Practitioner

## 2023-10-25 ENCOUNTER — Encounter: Payer: Self-pay | Admitting: Nurse Practitioner

## 2023-10-25 VITALS — BP 130/86 | HR 61 | Ht 63.0 in | Wt 208.8 lb

## 2023-10-25 DIAGNOSIS — M79601 Pain in right arm: Secondary | ICD-10-CM

## 2023-10-25 DIAGNOSIS — E039 Hypothyroidism, unspecified: Secondary | ICD-10-CM | POA: Diagnosis not present

## 2023-10-25 DIAGNOSIS — T2210XA Burn of first degree of shoulder and upper limb, except wrist and hand, unspecified site, initial encounter: Secondary | ICD-10-CM

## 2023-10-25 DIAGNOSIS — T300 Burn of unspecified body region, unspecified degree: Secondary | ICD-10-CM

## 2023-10-25 MED ORDER — MUPIROCIN 2 % EX OINT: 1.0000 | TOPICAL_OINTMENT | Freq: Two times a day (BID) | CUTANEOUS | 0 refills | Status: AC

## 2023-10-25 MED ORDER — DICLOFENAC SODIUM 1 % EX GEL: 4.0000 g | Freq: Four times a day (QID) | CUTANEOUS | 0 refills | Status: AC

## 2023-10-25 MED ORDER — LEVOTHYROXINE SODIUM 137 MCG PO TABS: 137.0000 ug | ORAL_TABLET | Freq: Every day | ORAL | 0 refills | Status: DC

## 2023-10-25 NOTE — Progress Notes (Signed)
 Assessment & Plan:  Kristen Moore was seen today for medical management of chronic issues.  Diagnoses and all orders for this visit:  Hypothyroidism, unspecified type -     levothyroxine  (SYNTHROID ) 137 MCG tablet; Take 1 tablet (137 mcg total) by mouth daily before breakfast. -     Thyroid  Panel With TSH -     Lipid panel -     CMP14+EGFR  Right arm pain -     diclofenac Sodium (VOLTAREN) 1 % GEL; Apply 4 g topically 4 (four) times daily. -     CBC with Differential -     Arthritis Panel -     DG Elbow 2 Views Right; Future -     DG Forearm Right; Future -     DG Hand Complete Right; Future  Skin burn -     mupirocin ointment (BACTROBAN) 2 %; Apply 1 Application topically 2 (two) times daily. To left elbow    Patient has been counseled on age-appropriate routine health concerns for screening and prevention. These are reviewed and up-to-date. Referrals have been placed accordingly. Immunizations are up-to-date or declined.    Subjective:   Chief Complaint  Patient presents with  . Medical Management of Chronic Issues    Left arm burn on elbow (R) arm pain    Kristen Moore 53 y.o. female presents to office today for hypothyroidism, right arm pain and burn of left elbow   She is a patient of DR. Newlin  States her RUE pain starts in her hands and radiates into the forearm. She is also experiencing right shoulder pain. 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 She reports that she has pain with squeezing her hand and gripping things with her right hand. She also reports some occasional tingling and numbness. 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 She denies any injury otherwise. Pain is primarily located to the lateral epicondyle. She has tried over-the-counter medication without resolution. Onset of pain was one month ago. She takes tylenol  and advil  with no relief. She has also  tried a copper sleeve with no relief.   SKIN BURN She has a 1st degree burn from cleaning out the rotisserie oven at work. She burned her arm last week and "just want you to look at it to make sure it is okay". The left elbow has mild erythema and no blistering at this time.   She works in the Clinical research associate at [employer]. Patient here today for evaluation of right arm pain that started a couple days ago.    Hypothyroidism Thyroid  levels at goal. She is taking levothyroxine  as prescribed and denies any symptoms of hypo or hyperthyroidism. Lab Results  Component Value Date   TSH 3.330 02/05/2023   T3TOTAL 82 02/05/2023   T4TOTAL 10.5 08/20/2018     Review of Systems  Constitutional:  Negative for fever, malaise/fatigue and weight loss.  HENT: Negative.  Negative for nosebleeds.   Eyes: Negative.  Negative for blurred vision, double vision and photophobia.  Respiratory: Negative.  Negative for cough and shortness of breath.   Cardiovascular: Negative.  Negative for chest pain, palpitations and leg swelling.  Gastrointestinal: Negative.  Negative for heartburn, nausea and vomiting.  Musculoskeletal:  Positive for joint pain. Negative for myalgias.  Skin:        SEE HPI  Neurological: Negative.  Negative  for dizziness, focal weakness, seizures and headaches.  Psychiatric/Behavioral: Negative.  Negative for suicidal ideas.     Past Medical History:  Diagnosis Date  . Abnormal Pap smear    cryo  . Anemia   . Asthma    as a child  . Basal cell carcinoma   . Bipolar 1 disorder (HCC)   . CHEST PAIN UNSPECIFIED 11/05/2008   Qualifier: Diagnosis of   By: Isabella Mao, LPN, Christine        . Cyst of breast    left breast  . DUB (dysfunctional uterine bleeding)   . Fibroids   . Headache(784.0)   . Hypothyroidism   . IBS (irritable bowel syndrome)   . Knee pain, right 12/13/2014   No pathology MRI R knee 12/17/14     . Pain in right elbow 05/07/2017  . Thyroid  dysfunction     Past  Surgical History:  Procedure Laterality Date  . ABDOMINAL HYSTERECTOMY Bilateral 12/25/2012   Procedure: HYSTERECTOMY ABDOMINAL;  Surgeon: Ana Balling, MD;  Location: [facility];  Service: Gynecology;  Laterality: Bilateral;  TAH and bilateral salpingectomy  . BILATERAL SALPINGECTOMY Bilateral 12/25/2012   Procedure: BILATERAL SALPINGECTOMY;  Surgeon: Ana Balling, MD;  Location: [facility];  Service: Gynecology;  Laterality: Bilateral;  . CERVICAL BIOPSY  W/ LOOP ELECTRODE EXCISION    . CHOLECYSTECTOMY    . COLONOSCOPY     10 + years ago  . DILATION AND CURETTAGE OF UTERUS    . OOPHORECTOMY Bilateral 12/25/2012   Procedure: OOPHORECTOMY;  Surgeon: Ana Balling, MD;  Location: [facility];  Service: Gynecology;  Laterality: Bilateral;  . TUBAL LIGATION    . UPPER GASTROINTESTINAL ENDOSCOPY     10 + years    Family History  Problem Relation Age of Onset  . Hypertension Mother   . Rheum arthritis Mother   . Fibromyalgia Mother   . COPD Mother   . Sleep apnea Mother   . Thyroid  disease Mother   . Ovarian cancer Mother 49  . Heart disease Father   . Lung cancer Father        dx 39s  . Breast cancer Maternal Grandmother        dx 30s  . Skin cancer Maternal Uncle        melanoma on back  . Breast cancer Paternal Aunt        dx 34s  . Lung cancer Paternal Uncle 44       mets to brain  . Colon cancer Other        MGF's brother; dx 23s  . Esophageal cancer Neg Hx   . Stomach cancer Neg Hx     Social History Reviewed with no changes to be made today.   Outpatient Medications Prior to Visit  Medication Sig Dispense Refill  . atorvastatin  (LIPITOR) 40 MG tablet TAKE 1 TABLET BY MOUTH EVERY DAY 30 tablet 0  . dicyclomine  (BENTYL ) 10 MG capsule Take 1 tablet (10 mg total) by mouth 2-3 times daily as needed for abdominal cramps and diarrhea 180 capsule 1  . hydrOXYzine  (ATARAX ) 10 MG tablet Take 0.5-1 tablets (5-10 mg total) by mouth 3 (three) times daily as needed for anxiety (sleep). 60 tablet  2  . lithium  carbonate 300 MG capsule TAKE 1 CAPSULE BY MOUTH EVERY MORNING & TAKE 2 CAPSULES EVERY NIGHT AT BEDTIME 90 capsule 1  . Multiple Vitamins-Minerals (ONE-A-DAY WOMENS PO) Take by mouth.    . QUEtiapine  (SEROQUEL ) 100 MG  tablet Take 1 tablet (100 mg total) by mouth at bedtime. 30 tablet 2  . levothyroxine  (SYNTHROID ) 137 MCG tablet Take 1 tablet (137 mcg total) by mouth daily before breakfast. Please keep upcoming appointment for more refills. 30 tablet 0  . buPROPion  (WELLBUTRIN  XL) 300 MG 24 hr tablet Take 1 tablet (300 mg total) by mouth daily. 30 tablet 2   No facility-administered medications prior to visit.    Allergies  Allergen Reactions  . Bee Venom Swelling and Anaphylaxis  . Ibuprofen  Swelling  . Latex Swelling  . Omega-3 Hives and Swelling  . Other Hives and Swelling    Potatoes     . Penicillins Swelling    Has patient had a PCN reaction causing immediate rash, facial/tongue/throat swelling, SOB or lightheadedness with hypotension: yes Has patient had a PCN reaction causing severe rash involving mucus membranes or skin necrosis: no Has patient had a PCN reaction that required hospitalization no Has patient had a PCN reaction occurring within the last 10 years: no If all of the above answers are "NO", then may proceed with Cephalosporin       Objective:    BP 130/86   Pulse 61   Ht 5\' 3"  (1.6 m)   Wt 208 lb 12.8 oz (94.7 kg)   LMP 11/29/2012   SpO2 98%   BMI 36.99 kg/m  Wt Readings from Last 3 Encounters:  10/25/23 208 lb 12.8 oz (94.7 kg)  09/01/23 200 lb (90.7 kg)  08/21/23 210 lb 9.6 oz (95.5 kg)    Physical Exam Vitals and nursing note reviewed.  Constitutional:      Appearance: She is well-developed.  HENT:     Head: Normocephalic and atraumatic.  Cardiovascular:     Rate and Rhythm: Normal rate and regular rhythm.     Heart sounds: Normal heart sounds. No murmur heard.    No friction rub. No gallop.  Pulmonary:     Effort: Pulmonary  effort is normal. No tachypnea or respiratory distress.     Breath sounds: Normal breath sounds. No decreased breath sounds, wheezing, rhonchi or rales.  Chest:     Chest wall: No tenderness.  Abdominal:     General: Bowel sounds are normal.     Palpations: Abdomen is soft.  Musculoskeletal:     Right shoulder: Tenderness present. Decreased range of motion.     Right wrist: Swelling present.     Right hand: Swelling present.     Cervical back: Normal range of motion.  Skin:    General: Skin is warm and dry.     Findings: Burn and erythema present.       Neurological:     Mental Status: She is alert and oriented to person, place, and time.     Coordination: Coordination normal.  Psychiatric:        Behavior: Behavior normal. Behavior is cooperative.        Thought Content: Thought content normal.        Judgment: Judgment normal.         Patient has been counseled extensively about nutrition and exercise as well as the importance of adherence with medications and regular follow-up. The patient was given clear instructions to go to ER or return to medical center if symptoms don't improve, worsen or new problems develop. The patient verbalized understanding.   Follow-up: Return in about 3 months (around 01/25/2024) for with PCP. print out letter please.   Collins Dean, FNP-BC [place]  [facility] [place], [place] [phone]   10/25/2023, 4:27 PM

## 2023-10-26 LAB — CBC WITH DIFFERENTIAL/PLATELET
Basophils Absolute: 0 10*3/uL (ref 0.0–0.2)
Basos: 1 %
EOS (ABSOLUTE): 0.2 10*3/uL (ref 0.0–0.4)
Eos: 3 %
Hematocrit: 42.8 % (ref 34.0–46.6)
Hemoglobin: 14.4 g/dL (ref 11.1–15.9)
Immature Grans (Abs): 0 10*3/uL (ref 0.0–0.1)
Immature Granulocytes: 0 %
Lymphocytes Absolute: 1.6 10*3/uL (ref 0.7–3.1)
Lymphs: 26 %
MCH: 33 pg (ref 26.6–33.0)
MCHC: 33.6 g/dL (ref 31.5–35.7)
MCV: 98 fL — ABNORMAL HIGH (ref 79–97)
Monocytes Absolute: 0.3 10*3/uL (ref 0.1–0.9)
Monocytes: 5 %
Neutrophils Absolute: 3.9 10*3/uL (ref 1.4–7.0)
Neutrophils: 65 %
Platelets: 194 10*3/uL (ref 150–450)
RBC: 4.37 x10E6/uL (ref 3.77–5.28)
RDW: 12.5 % (ref 11.7–15.4)
WBC: 6.1 10*3/uL (ref 3.4–10.8)

## 2023-10-26 LAB — CMP14+EGFR
ALT: 21 IU/L (ref 0–32)
AST: 18 IU/L (ref 0–40)
Albumin: 4.6 g/dL (ref 3.9–4.9)
Alkaline Phosphatase: 87 IU/L (ref 44–121)
BUN/Creatinine Ratio: 15 (ref 9–23)
BUN: 14 mg/dL (ref 6–24)
Bilirubin Total: 0.3 mg/dL (ref 0.0–1.2)
CO2: 23 mmol/L (ref 20–29)
Calcium: 10.2 mg/dL (ref 8.7–10.2)
Chloride: 105 mmol/L (ref 96–106)
Creatinine, Ser: 0.91 mg/dL (ref 0.57–1.00)
Globulin, Total: 2 g/dL (ref 1.5–4.5)
Glucose: 89 mg/dL (ref 70–99)
Potassium: 4.3 mmol/L (ref 3.5–5.2)
Sodium: 141 mmol/L (ref 134–144)
Total Protein: 6.6 g/dL (ref 6.0–8.5)
eGFR: 77 mL/min/{1.73_m2} (ref >59)

## 2023-10-26 LAB — LIPID PANEL
Chol/HDL Ratio: 3.8 ratio (ref 0.0–4.4)
Cholesterol, Total: 152 mg/dL (ref 100–199)
HDL: 40 mg/dL (ref >39)
LDL Chol Calc (NIH): 71 mg/dL (ref 0–99)
Triglycerides: 254 mg/dL — ABNORMAL HIGH (ref 0–149)
VLDL Cholesterol Cal: 41 mg/dL — ABNORMAL HIGH (ref 5–40)

## 2023-10-26 LAB — ARTHRITIS PANEL
Anti Nuclear Antibody (ANA): NEGATIVE
Rheumatoid fact SerPl-aCnc: <10 [IU]/mL (ref <14.0)
Sed Rate: 4 mm/h (ref 0–40)
Uric Acid: 4 mg/dL (ref 2.6–6.2)

## 2023-10-26 LAB — THYROID PANEL WITH TSH
Free Thyroxine Index: 2.2 (ref 1.2–4.9)
T3 Uptake Ratio: 24 % (ref 24–39)
T4, Total: 9.3 ug/dL (ref 4.5–12.0)
TSH: 2.51 u[IU]/mL (ref 0.450–4.500)

## 2023-10-28 ENCOUNTER — Ambulatory Visit: Payer: Self-pay | Admitting: Nurse Practitioner

## 2023-11-04 ENCOUNTER — Ambulatory Visit
Admission: RE | Admit: 2023-11-04 | Discharge: 2023-11-04 | Disposition: A | Discharge status: Home / Self Care | Source: Ambulatory Visit | Attending: Nurse Practitioner | Admitting: Nurse Practitioner

## 2023-11-04 DIAGNOSIS — R2231 Localized swelling, mass and lump, right upper limb: Secondary | ICD-10-CM | POA: Diagnosis not present

## 2023-11-04 DIAGNOSIS — M1811 Unilateral primary osteoarthritis of first carpometacarpal joint, right hand: Secondary | ICD-10-CM | POA: Diagnosis not present

## 2023-11-04 DIAGNOSIS — M79601 Pain in right arm: Secondary | ICD-10-CM

## 2023-11-04 DIAGNOSIS — M79604 Pain in right leg: Secondary | ICD-10-CM | POA: Diagnosis not present

## 2023-11-05 ENCOUNTER — Ambulatory Visit: Payer: Self-pay

## 2023-11-05 NOTE — Telephone Encounter (Signed)
 Patient called in with additional questions about her imaging results from yesterday. Results have not yet been reviewed by provider. Advised patient that she would receive a call back, once results have been reviewed. Patient also wanted to report that the Voltaren  gel is not working and is requesting an alternative to be called in. Please advise.   Copied from CRM [id number]. Topic: Clinical - Lab/Test Results >> Nov 05, 2023  9:27 AM Juluis Ok wrote: Reason for CRM: Patient requesting imaging results  Reason for Disposition  [1] Caller requesting NON-URGENT health information AND [2] PCP's office is the best resource  Protocols used: Information Only Call - No Triage-A-AH

## 2023-11-05 NOTE — Telephone Encounter (Signed)
 Noted.  Pt will be called once results are in [facility]

## 2023-11-07 ENCOUNTER — Ambulatory Visit: Payer: Self-pay

## 2023-11-07 NOTE — Telephone Encounter (Signed)
 Noted

## 2023-11-07 NOTE — Telephone Encounter (Signed)
 FYI Only or Action Required?: Action required by provider  Patient was last seen in primary care on 10/25/2023 by Collins Dean, NP. Called Nurse Triage reporting Arm Pain and Hand Pain. Symptoms began several weeks ago. Interventions attempted: OTC medications: Voltaren , Tylenol  and Rest, hydration, or home remedies. Symptoms are: gradually worsening.  Triage Disposition: Go to ED or PCP/Alternative with Approval  Patient/caregiver understands and will follow disposition?: Unsure                         Copied from CRM [id number]. Topic: Clinical - Red Word Triage >> Nov 07, 2023 11:48 AM Elle L wrote: Red Word that prompted transfer to Nurse Triage: The patient called as she is waiting for her imaging results. However, she is having severe pain in her arm and hand. Reason for Disposition  [1] SEVERE pain AND [2] not improved 2 hours after pain medicine  Answer Assessment - Initial Assessment Questions Pt called about imaging results from 6/2 xrays. RN shared result with pt. Pt reports 9/10 arm pain with redness, swelling, numbness, and weakness. RN advised ED. RN not confident pt will go.     1. ONSET: "When did the pain start?"     Ongoing problem, was seen 5/23 in the office and had Xrays on 6/2 2. LOCATION: "Where is the pain located?"     Arm and hand - R 3. PAIN: "How bad is the pain?" (Scale 1-10; or mild, moderate, severe)   - MILD (1-3): Doesn't interfere with normal activities.   - MODERATE (4-7): Interferes with normal activities (e.g., work or school) or awakens from sleep.   - SEVERE (8-10): Excruciating pain, unable to do any normal activities, unable to hold a cup of water.     9/10 4. WORK OR EXERCISE: "Has there been any recent work or exercise that involved this part of the body?"     Works at a deli  5. CAUSE: "What do you think is causing the arm pain?"     OA (note from 6/2 Xrays) 6. OTHER SYMPTOMS: "Do you have any other symptoms?" (e.g.,  neck pain, swelling, rash, fever, numbness, weakness)  Endorses redness and swelling, numbness and weakness. Denies CP and SOB. Denies rashes.     Mild OA per Zelda Fleming's note. ZF advised using voltaren  gel, pt states it does not work  Takes Tylenol , can't take ibuprofen . Tylenol  does not help. 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 Pain poorly controlled  Protocols used: Arm Pain-A-AH
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
## 2023-11-11 ENCOUNTER — Telehealth (INDEPENDENT_AMBULATORY_CARE_PROVIDER_SITE_OTHER): Admitting: Psychiatry

## 2023-11-11 ENCOUNTER — Encounter (HOSPITAL_COMMUNITY): Payer: Self-pay | Admitting: Psychiatry

## 2023-11-11 DIAGNOSIS — F319 Bipolar disorder, unspecified: Secondary | ICD-10-CM

## 2023-11-11 DIAGNOSIS — F411 Generalized anxiety disorder: Secondary | ICD-10-CM | POA: Diagnosis not present

## 2023-11-11 MED ORDER — LITHIUM CARBONATE 300 MG PO CAPS: ORAL_CAPSULE | ORAL | 2 refills | Status: DC

## 2023-11-11 MED ORDER — HYDROXYZINE HCL 10 MG PO TABS
5.0000 mg | ORAL_TABLET | Freq: Two times a day (BID) | ORAL | 2 refills | Status: DC | PRN
Start: 2023-11-11 — End: 2023-12-03

## 2023-11-11 MED ORDER — QUETIAPINE FUMARATE 200 MG PO TABS: 200.0000 mg | ORAL_TABLET | Freq: Every day | ORAL | 2 refills | Status: DC

## 2023-11-11 MED ORDER — BUPROPION HCL ER (XL) 300 MG PO TB24: 300.0000 mg | ORAL_TABLET | Freq: Every day | ORAL | 2 refills | Status: DC

## 2023-11-11 NOTE — Patient Instructions (Signed)
 Thank you for attending your appointment today.  -- INCREASE Seroquel  to 200 mg nightly -- Continue other medications as prescribed.  Please do not make any changes to medications without first discussing with your provider. If you are experiencing a psychiatric emergency, please call 911 or present to your nearest emergency department. Additional crisis, medication management, and therapy resources are included below.  [facility]  [address] [place], [place] [address] [phone] WALK-IN URGENT CARE 24/7 FOR ANYONE [address] [place], [place]  [phone] Fax: [phone] [email or web address] *Interpreters available *Accepts all insurance and uninsured for Urgent Care needs *Accepts Medicaid and uninsured for outpatient treatment (below)      ONLY FOR [facility]  Below:    Outpatient New Patient Assessment/Therapy Walk-ins:        Monday, Wednesday, and Thursday 8am until slots are full (first come, first served)                   New Patient Psychiatry/Medication Management        Monday-Friday 8am-11am (first come, first served)               For all walk-ins we ask that you arrive by 7:15am, because patients will be seen in the order of arrival.

## 2023-11-12 ENCOUNTER — Telehealth (HOSPITAL_COMMUNITY): Payer: Self-pay

## 2023-11-12 NOTE — Telephone Encounter (Signed)
 Pa was started on behalf of pt today for Quetiapine  200 mg

## 2023-11-14 ENCOUNTER — Other Ambulatory Visit: Payer: Self-pay | Admitting: Family Medicine

## 2023-11-14 DIAGNOSIS — E7800 Pure hypercholesterolemia, unspecified: Secondary | ICD-10-CM

## 2023-11-14 NOTE — Telephone Encounter (Signed)
 Requested Prescriptions  Pending Prescriptions Disp Refills   atorvastatin  (LIPITOR) 40 MG tablet [Pharmacy Med Name: ATORVASTATIN  40 MG TABLET] 90 tablet 0    Sig: TAKE 1 TABLET BY MOUTH EVERY DAY     Cardiovascular:  Antilipid - Statins Failed - 11/14/2023  1:25 PM      Failed - Lipid Panel in normal range within the last 12 months    Cholesterol, Total  Date Value Ref Range Status  10/25/2023 152 100 - 199 mg/dL Final   LDL Chol Calc (NIH)  Date Value Ref Range Status  10/25/2023 71 0 - 99 mg/dL Final   HDL  Date Value Ref Range Status  10/25/2023 40 >39 mg/dL Final   Triglycerides  Date Value Ref Range Status  10/25/2023 254 (H) 0 - 149 mg/dL Final         Passed - Patient is not pregnant      Passed - Valid encounter within last 12 months    Recent Outpatient Visits           2 weeks ago Hypothyroidism, unspecified type   [place] [facility] Dept Of [place]. [facility] Collins Dean, NP   8 months ago Burning with urination   [place] [facility] - A Dept Of [place]. [facility] Collins Dean, NP   9 months ago Hypothyroidism, unspecified type   [place] [facility] [place] - A Dept Of [place]. [facility] Joaquin Mulberry, MD   11 months ago Patient left before evaluation by physician   [place] [facility] - A Dept Of [place]. [facility] Joaquin Mulberry, MD   1 year ago Hypothyroidism, unspecified type   [place] [facility] - A Dept Of . [facility] Joaquin Mulberry, MD

## 2023-12-03 ENCOUNTER — Other Ambulatory Visit (HOSPITAL_COMMUNITY): Payer: Self-pay | Admitting: Psychiatry

## 2023-12-03 DIAGNOSIS — F411 Generalized anxiety disorder: Secondary | ICD-10-CM

## 2023-12-14 ENCOUNTER — Other Ambulatory Visit: Payer: Self-pay | Admitting: Nurse Practitioner

## 2023-12-14 DIAGNOSIS — E039 Hypothyroidism, unspecified: Secondary | ICD-10-CM

## 2023-12-26 ENCOUNTER — Other Ambulatory Visit: Payer: Self-pay | Admitting: Family Medicine

## 2023-12-26 DIAGNOSIS — E7800 Pure hypercholesterolemia, unspecified: Secondary | ICD-10-CM

## 2024-01-10 ENCOUNTER — Other Ambulatory Visit: Payer: Self-pay

## 2024-01-10 ENCOUNTER — Emergency Department (HOSPITAL_COMMUNITY): Admission: EM | Admit: 2024-01-10 | Discharge: 2024-01-10 | Disposition: A | Discharge status: Home / Self Care

## 2024-01-10 ENCOUNTER — Encounter (HOSPITAL_COMMUNITY): Payer: Self-pay | Admitting: Radiology

## 2024-01-10 ENCOUNTER — Emergency Department (HOSPITAL_COMMUNITY)

## 2024-01-10 DIAGNOSIS — R109 Unspecified abdominal pain: Secondary | ICD-10-CM | POA: Diagnosis not present

## 2024-01-10 DIAGNOSIS — R1032 Left lower quadrant pain: Secondary | ICD-10-CM | POA: Diagnosis not present

## 2024-01-10 DIAGNOSIS — Z91040 Latex allergy status: Secondary | ICD-10-CM | POA: Insufficient documentation

## 2024-01-10 DIAGNOSIS — Z90710 Acquired absence of both cervix and uterus: Secondary | ICD-10-CM | POA: Diagnosis not present

## 2024-01-10 DIAGNOSIS — Z9049 Acquired absence of other specified parts of digestive tract: Secondary | ICD-10-CM | POA: Diagnosis not present

## 2024-01-10 LAB — CBC
HCT: 45.2 % (ref 36.0–46.0)
Hemoglobin: 14.6 g/dL (ref 12.0–15.0)
MCH: 31.5 pg (ref 26.0–34.0)
MCHC: 32.3 g/dL (ref 30.0–36.0)
MCV: 97.6 fL (ref 80.0–100.0)
Platelets: 173 K/uL (ref 150–400)
RBC: 4.63 MIL/uL (ref 3.87–5.11)
RDW: 11.9 % (ref 11.5–15.5)
WBC: 6 K/uL (ref 4.0–10.5)
nRBC: 0 % (ref 0.0–0.2)

## 2024-01-10 LAB — BASIC METABOLIC PANEL WITH GFR
Anion gap: 7 (ref 5–15)
BUN: 19 mg/dL (ref 6–20)
CO2: 25 mmol/L (ref 22–32)
Calcium: 10 mg/dL (ref 8.9–10.3)
Chloride: 107 mmol/L (ref 98–111)
Creatinine, Ser: 0.83 mg/dL (ref 0.44–1.00)
GFR, Estimated: >60 mL/min (ref >60)
Glucose, Bld: 95 mg/dL (ref 70–99)
Potassium: 4.1 mmol/L (ref 3.5–5.1)
Sodium: 139 mmol/L (ref 135–145)

## 2024-01-10 LAB — URINALYSIS, ROUTINE W REFLEX MICROSCOPIC
Bilirubin Urine: NEGATIVE
Glucose, UA: NEGATIVE mg/dL
Hgb urine dipstick: NEGATIVE
Ketones, ur: NEGATIVE mg/dL
Leukocytes,Ua: NEGATIVE
Nitrite: NEGATIVE
Protein, ur: NEGATIVE mg/dL
Specific Gravity, Urine: 1.015 (ref 1.005–1.030)
pH: 8 (ref 5.0–8.0)

## 2024-01-10 LAB — PREGNANCY, URINE: Preg Test, Ur: NEGATIVE

## 2024-01-10 MED ORDER — METHOCARBAMOL 500 MG PO TABS: 500.0000 mg | ORAL_TABLET | Freq: Four times a day (QID) | ORAL | 0 refills | Status: DC | PRN

## 2024-01-10 MED ORDER — KETOROLAC TROMETHAMINE 15 MG/ML IJ SOLN
15.0000 mg | Freq: Once | INTRAMUSCULAR | Status: AC
  Administered 2024-01-10: 15 mg via INTRAMUSCULAR
  Filled 2024-01-10: qty 1

## 2024-01-10 NOTE — ED Triage Notes (Signed)
 Pt reports left side mid abd pain around to the back x1 week, has subsided some but still feels achy. Had an itchy rash on back of right thigh for 1 day. Denies urinary sx, denies hx stones.

## 2024-01-10 NOTE — ED Provider Notes (Signed)
 [place] EMERGENCY DEPARTMENT AT [facility] Provider Note   CSN: [id number] Arrival date & time: 01/10/24  9048     Patient presents with: Flank Pain   Kristen Moore is a 53 y.o. female.   53 year old female presents for evaluation of left flank pain.  States it radiates to her upper abdomen.  She states that been going on for a few days but been getting worse.  States she did have some dysuria few days ago but now that has resolved.  Patient denies any diarrhea or nausea or vomiting.  States she did develop a rash at 1 point but it was on her right leg and she has not seen a rash on her left side.  Denies any other symptoms or concerns at this time.   Flank Pain Pertinent negatives include no chest pain, no abdominal pain and no shortness of breath.       Prior to Admission medications   Medication Sig Start Date End Date Taking? Authorizing Provider  atorvastatin  (LIPITOR) 40 MG tablet TAKE 1 TABLET BY MOUTH EVERY DAY Patient taking differently: Take 40 mg by mouth at bedtime. 11/14/23  Yes Newlin, Enobong, MD  buPROPion  (WELLBUTRIN  XL) 300 MG 24 hr tablet Take 1 tablet (300 mg total) by mouth daily. 11/11/23 02/09/24 Yes Bahraini, Sarah A  Collagen-Vitamin C-Biotin (COLLAGEN PO) Take 2 tablets by mouth daily.   Yes [provider]  dicyclomine  (BENTYL ) 10 MG capsule Take 1 tablet (10 mg total) by mouth 2-3 times daily as needed for abdominal cramps and diarrhea 10/11/22  Yes Danis, Victory LITTIE III, MD  hydrOXYzine  (ATARAX ) 10 MG tablet TAKE 0.5-1 TABLETS (5-10 MG TOTAL) BY MOUTH 2 (TWO) TIMES DAILY AS NEEDED FOR ANXIETY (SLEEP). Patient taking differently: Take 20 mg by mouth at bedtime. 12/03/23  Yes Bahraini, Sarah A  lithium  carbonate 300 MG capsule Take 1 capsule (300 mg total) by mouth in the morning AND 2 capsules (600 mg total) at bedtime. 11/11/23 02/09/24 Yes Bahraini, Sarah A  methocarbamol  (ROBAXIN ) 500 MG tablet Take 1 tablet (500 mg total) by mouth every 6  (six) hours as needed for muscle spasms. 01/10/24  Yes Magdelena Kinsella L, DO  Multiple Vitamins-Minerals (ONE-A-DAY WOMENS PO) Take 1 tablet by mouth daily.   Yes [provider]  QUEtiapine  (SEROQUEL ) 200 MG tablet TAKE 1 TABLET BY MOUTH AT BEDTIME. 12/03/23  Yes Bahraini, Sarah A  SYNTHROID  137 MCG tablet TAKE 1 TABLET BY MOUTH DAILY BEFORE BREAKFAST. 12/16/23  Yes Fleming, Zelda W, NP  mupirocin  ointment (BACTROBAN ) 2 % Apply 1 Application topically 2 (two) times daily. To left elbow Patient not taking: Reported on 01/10/2024 10/25/23   Theotis Haze ORN, NP    Allergies: Bee venom, Ibuprofen , Latex, Omega-3, Other, and Penicillins    Review of Systems  Constitutional:  Negative for chills and fever.  HENT:  Negative for ear pain and sore throat.   Eyes:  Negative for pain and visual disturbance.  Respiratory:  Negative for cough and shortness of breath.   Cardiovascular:  Negative for chest pain and palpitations.  Gastrointestinal:  Negative for abdominal pain and vomiting.  Genitourinary:  Positive for flank pain. Negative for dysuria and hematuria.  Musculoskeletal:  Negative for arthralgias and back pain.  Skin:  Negative for color change and rash.  Neurological:  Negative for seizures and syncope.  All other systems reviewed and are negative.   Updated Vital Signs BP 117/82   Pulse 62   Temp  98.2 F (36.8 C) (Oral)   Resp 17   LMP 11/29/2012   SpO2 99%   Physical Exam Vitals and nursing note reviewed.  Constitutional:      General: She is not in acute distress.    Appearance: Normal appearance. She is well-developed. She is not ill-appearing.  HENT:     Head: Normocephalic and atraumatic.  Eyes:     Conjunctiva/sclera: Conjunctivae normal.  Cardiovascular:     Rate and Rhythm: Normal rate and regular rhythm.     Heart sounds: No murmur heard. Pulmonary:     Effort: Pulmonary effort is normal. No respiratory distress.     Breath sounds: Normal breath sounds.   Abdominal:     General: There is no distension.     Palpations: Abdomen is soft. There is no mass.     Tenderness: There is no abdominal tenderness. There is left CVA tenderness.     Hernia: No hernia is present.  Musculoskeletal:        General: No swelling.     Cervical back: Neck supple.  Skin:    General: Skin is warm and dry.     Capillary Refill: Capillary refill takes less than 2 seconds.  Neurological:     Mental Status: She is alert.  Psychiatric:        Mood and Affect: Mood normal.     (all labs ordered are listed, but only abnormal results are displayed) Labs Reviewed  URINALYSIS, ROUTINE W REFLEX MICROSCOPIC  BASIC METABOLIC PANEL WITH GFR  CBC  PREGNANCY, URINE    EKG: None  Radiology: CT Renal Stone Study Result Date: 01/10/2024 CLINICAL DATA:  Acute left-sided abdominal pain. EXAM: CT ABDOMEN AND PELVIS WITHOUT CONTRAST TECHNIQUE: Multidetector CT imaging of the abdomen and pelvis was performed following the standard protocol without IV contrast. RADIATION DOSE REDUCTION: This exam was performed according to the departmental dose-optimization program which includes automated exposure control, adjustment of the mA and/or kV according to patient size and/or use of iterative reconstruction technique. COMPARISON:  February 22, 2023. FINDINGS: Lower chest: No acute abnormality. Hepatobiliary: No focal liver abnormality is seen. Status post cholecystectomy. No biliary dilatation. Pancreas: Unremarkable. No pancreatic ductal dilatation or surrounding inflammatory changes. Spleen: Normal in size without focal abnormality. Adrenals/Urinary Tract: Adrenal glands are unremarkable. Kidneys are normal, without renal calculi, focal lesion, or hydronephrosis. Bladder is unremarkable. Stomach/Bowel: Stomach is within normal limits. Appendix appears normal. No evidence of bowel wall thickening, distention, or inflammatory changes. Vascular/Lymphatic: No significant vascular findings  are present. No enlarged abdominal or pelvic lymph nodes. Reproductive: Status post hysterectomy. No adnexal masses. Other: No abdominal wall hernia or abnormality. No abdominopelvic ascites. Musculoskeletal: No acute or significant osseous findings. IMPRESSION: No acute abnormality seen in the abdomen or pelvis. Electronically Signed   By: Lynwood Landy Raddle M.D.   On: 01/10/2024 11:58     Procedures   Medications Ordered in the ED  ketorolac  (TORADOL ) 15 MG/ML injection 15 mg (15 mg Intramuscular Given 01/10/24 1042)                                    Medical Decision Making Patient for left flank pain, rashes on the opposite side does not appear to be shingles she seems unrelated.  CT abdomen pelvis and workup largely negative.  Likely muscle spasm.  Was given Toradol  here and feeling better.  Advised Tylenol  or Motrin  as  needed for pain I will give her prescription for Robaxin .  Advise close follow-up with primary care doctor otherwise return to the ER for any worsening symptoms.  She feels comfortable to plan be discharged  Problems Addressed: Left flank pain: acute illness or injury  Amount and/or Complexity of Data Reviewed External Data Reviewed: notes.    Details: Outpatient records reviewed and patient was seen in March 2025 for epicondylitis of her elbow Labs: ordered. Decision-making details documented in ED Course.    Details: Ordered and reviewed by me and unremarkable.  CBC BMP and UA are negative Radiology: ordered and independent interpretation performed. Decision-making details documented in ED Course.    Details: Ordered and reviewed by me CT abdomen pelvis shows no acute abnormality  Risk OTC drugs. Prescription drug management.     Final diagnoses:  Left flank pain    ED Discharge Orders          Ordered    methocarbamol  (ROBAXIN ) 500 MG tablet  Every 6 hours PRN        01/10/24 1302               Valeska Haislip L, DO 01/10/24 1635

## 2024-01-10 NOTE — Discharge Instructions (Signed)
 Take tylenol  and motrin  as needed for pain. Take your robaxin  as needed for pain.

## 2024-01-24 ENCOUNTER — Telehealth: Payer: Self-pay | Admitting: Family Medicine

## 2024-01-24 NOTE — Telephone Encounter (Signed)
 Pt confirmed appt 8/22

## 2024-01-27 ENCOUNTER — Encounter: Payer: Self-pay | Admitting: Family Medicine

## 2024-01-27 ENCOUNTER — Ambulatory Visit: Attending: Family Medicine | Admitting: Family Medicine

## 2024-01-27 VITALS — BP 135/77 | HR 66 | Ht 63.0 in | Wt 203.0 lb

## 2024-01-27 DIAGNOSIS — L659 Nonscarring hair loss, unspecified: Secondary | ICD-10-CM | POA: Diagnosis not present

## 2024-01-27 DIAGNOSIS — R600 Localized edema: Secondary | ICD-10-CM

## 2024-01-27 DIAGNOSIS — R232 Flushing: Secondary | ICD-10-CM | POA: Diagnosis not present

## 2024-01-27 DIAGNOSIS — K5909 Other constipation: Secondary | ICD-10-CM

## 2024-01-27 DIAGNOSIS — F319 Bipolar disorder, unspecified: Secondary | ICD-10-CM | POA: Diagnosis not present

## 2024-01-27 DIAGNOSIS — Z23 Encounter for immunization: Secondary | ICD-10-CM | POA: Diagnosis not present

## 2024-01-27 DIAGNOSIS — E7800 Pure hypercholesterolemia, unspecified: Secondary | ICD-10-CM

## 2024-01-27 DIAGNOSIS — E039 Hypothyroidism, unspecified: Secondary | ICD-10-CM

## 2024-01-27 DIAGNOSIS — Z780 Asymptomatic menopausal state: Secondary | ICD-10-CM | POA: Diagnosis not present

## 2024-01-27 MED ORDER — ATORVASTATIN CALCIUM 40 MG PO TABS: 40.0000 mg | ORAL_TABLET | Freq: Every day | ORAL | 1 refills | Status: AC

## 2024-01-27 MED ORDER — LUBIPROSTONE 8 MCG PO CAPS
8.0000 ug | ORAL_CAPSULE | Freq: Two times a day (BID) | ORAL | 1 refills | Status: AC
Start: 2024-01-27 — End: ?

## 2024-01-27 MED ORDER — FUROSEMIDE 20 MG PO TABS: 20.0000 mg | ORAL_TABLET | Freq: Every day | ORAL | 3 refills | Status: DC

## 2024-01-27 MED ORDER — LEVOTHYROXINE SODIUM 137 MCG PO TABS: 137.0000 ug | ORAL_TABLET | Freq: Every day | ORAL | 1 refills | Status: AC

## 2024-01-27 NOTE — Patient Instructions (Signed)
 VISIT SUMMARY:  Today, you were seen for leg swelling and hair loss. We also discussed your constipation, menopause symptoms, cholesterol levels, thyroid  function, and mental health. We reviewed your current medications and made some adjustments to better manage your symptoms.  YOUR PLAN:  -BILATERAL LOWER EXTREMITY EDEMA: This means you have swelling in both of your legs, likely due to standing for long periods at work. We will continue with furosemide  as needed and recommend you use compression stockings, especially in colder weather.  -NONSCARRING HAIR LOSS: This refers to hair thinning without visible scarring on the scalp, possibly due to stress, medication, or hormonal changes after your hysterectomy. We will refer you to a dermatologist for further evaluation and suggest you try over-the-counter biotin supplements.  -CONSTIPATION: This means you have difficulty with bowel movements. Since Miralax  was not effective and Citrucel is hard to afford, we will prescribe Amitiza  8 mcg to be taken twice daily.  -SURGICAL MENOPAUSE WITH VASOMOTOR SYMPTOMS: This refers to symptoms like hot flashes that occur after menopause due to surgery. Since you stopped estrogen therapy due to cancer risks, we will refer you to a gynecologist for further evaluation and management.  -PURE HYPERCHOLESTEROLEMIA: This means you have high cholesterol levels. Your total cholesterol is good, but your triglycerides are slightly elevated. We will continue your atorvastatin  therapy and recommend adding fish oil capsules.  -HYPOTHYROIDISM: This means your thyroid  gland is underactive. Your thyroid  function was normal in May, and we will continue managing it with levothyroxine . Your prescription will be refilled.  -BIPOLAR DISORDER WITH ANXIETY SYMPTOMS: This means you have bipolar disorder and experience significant anxiety, especially due to personal and family stress. We encourage you to find a therapist and recommend  regular exercise, such as walking.  INSTRUCTIONS:  Please follow up with the dermatologist and gynecologist as referred. Continue taking your prescribed medications as directed. Consider adding fish oil capsules to your routine. Try to find a therapist to help manage your stress and anxiety, and incorporate regular exercise into your daily routine.

## 2024-01-27 NOTE — Progress Notes (Unsigned)
 Subjective:  Patient ID: Kristen Moore, female    DOB: 04-29-1971  Age: 52 y.o. MRN: [id number]  CC: Medical Management of Chronic Issues (Hair loss/)     Discussed the use of AI scribe software for clinical note transcription with the patient, who gave verbal consent to proceed.  History of Present Illness Kristen Moore is a 53 year old female with a history of bipolar depression, hypothyroidism, hyperlipidemia, tobacco abuse, asthma, BCC of the face s/p excision. who presents with leg swelling and hair loss.  She experiences leg swelling from the ankle to the foot, worsening by the end of the day. She denies high sodium intake and is frequently on her feet at work. She uses furosemide  as needed for swelling. No shortness of breath is present.  Hair loss is noted with thinning from the scalp. She is wondering if there is a link to medication. She had a hysterectomy at 70 and was on HRT for several years but lately discontinued estrogen therapy due to cancer risk concerns. She experiences hot flashes and has tried collagen supplements without improvement. No significant itching or flaking of the scalp.  She has bipolar disorder and anxiety, exacerbated by family stressors including financial difficulties, her daughter's substance abuse and health issues, and caring for her brother with special needs. She lacks a therapist and finds it challenging to connect with one as to once she has connected with in the past have left their practices.  She experiences constipation and uses Citrucel, but finds it difficult to afford. Miralax  was ineffective, and she seeks an alternative solution.  She has irregular eating habits, sometimes eating little and other times more, with a preference for junk food. She works in a Clinical research associate, often closing late, affecting her meal timing. She smokes occasionally and does not engage in regular exercise, though she is active at work.    Past Medical  History:  Diagnosis Date   Abnormal Pap smear    cryo   Anemia    Asthma    as a child   Basal cell carcinoma    Bipolar 1 disorder (HCC)    CHEST PAIN UNSPECIFIED 11/05/2008   Qualifier: Diagnosis of   By: York, LPN, Christine         Cyst of breast    left breast   DUB (dysfunctional uterine bleeding)    Fibroids    Headache(784.0)    Hypothyroidism    IBS (irritable bowel syndrome)    Knee pain, right 12/13/2014   No pathology MRI R knee 12/17/14      Pain in right elbow 05/07/2017   Thyroid  dysfunction     Past Surgical History:  Procedure Laterality Date   ABDOMINAL HYSTERECTOMY Bilateral 12/25/2012   Procedure: HYSTERECTOMY ABDOMINAL;  Surgeon: Harland JAYSON Birkenhead, MD;  Location: [facility];  Service: Gynecology;  Laterality: Bilateral;  TAH and bilateral salpingectomy   BILATERAL SALPINGECTOMY Bilateral 12/25/2012   Procedure: BILATERAL SALPINGECTOMY;  Surgeon: Harland JAYSON Birkenhead, MD;  Location: [facility];  Service: Gynecology;  Laterality: Bilateral;   CERVICAL BIOPSY  W/ LOOP ELECTRODE EXCISION     CHOLECYSTECTOMY     COLONOSCOPY     10 + years ago   DILATION AND CURETTAGE OF UTERUS     OOPHORECTOMY Bilateral 12/25/2012   Procedure: OOPHORECTOMY;  Surgeon: Harland JAYSON Birkenhead, MD;  Location: [facility];  Service: Gynecology;  Laterality: Bilateral;   TUBAL LIGATION     UPPER GASTROINTESTINAL ENDOSCOPY  10 + years    Family History  Problem Relation Age of Onset   Hypertension Mother    Rheum arthritis Mother    Fibromyalgia Mother    COPD Mother    Sleep apnea Mother    Thyroid  disease Mother    Ovarian cancer Mother 21   Heart disease Father    Lung cancer Father        dx 75s   Breast cancer Maternal Grandmother        dx 49s   Skin cancer Maternal Uncle        melanoma on back   Breast cancer Paternal Aunt        dx 22s   Lung cancer Paternal Uncle 44       mets to brain   Colon cancer Other        MGF's brother; dx 81s   Esophageal cancer Neg Hx    Stomach cancer Neg Hx      Social History   Socioeconomic History   Marital status: Single    Spouse name: Not on file   Number of children: 1   Years of education: Not on file   Highest education level: Associate degree: occupational, Scientist, product/process development, or vocational program  Occupational History   Not on file  Tobacco Use   Smoking status: Every Day    Current packs/day: 0.50    Average packs/day: 0.5 packs/day for 9.0 years (4.5 ttl pk-yrs)    Types: Cigarettes   Smokeless tobacco: Never  Vaping Use   Vaping status: Never Used  Substance and Sexual Activity   Alcohol use: No    Alcohol/week: 0.0 standard drinks of alcohol   Drug use: No   Sexual activity: Not Currently    Birth control/protection: Surgical  Other Topics Concern   Not on file  Social History Narrative   Mother has ovarian cancer that has spread to lungs (passed April 2023), Dad has lung cancer that has spread to kidneys, and disabled brother all live in home with patient. Patient is primary caregiver. Daughter is drug addict, and has loss custody of patient's infant granddaughter in 02/2020. Patient works for a Estate manager/land agent hospital buildings. Patient is very stressed and saddened about her current situation.    Social Drivers of Corporate investment banker Strain: High Risk (03/08/2023)   Overall Financial Resource Strain (CARDIA)    Difficulty of Paying Living Expenses: Very hard  Food Insecurity: No Food Insecurity (03/08/2023)   Hunger Vital Sign    Worried About Running Out of Food in the Last Year: Never true    Ran Out of Food in the Last Year: Never true  Transportation Needs: No Transportation Needs (03/08/2023)   PRAPARE - Administrator, Civil Service (Medical): No    Lack of Transportation (Non-Medical): No  Physical Activity: Sufficiently Active (03/08/2023)   Exercise Vital Sign    Days of Exercise per Week: 7 days    Minutes of Exercise per Session: 30 min  Stress: Stress Concern Present (03/08/2023)    [employer] of Occupational Health - Occupational Stress Questionnaire    Feeling of Stress : To some extent  Social Connections: Socially Isolated (03/08/2023)   Social Connection and Isolation Panel    Frequency of Communication with Friends and Family: More than three times a week    Frequency of Social Gatherings with Friends and Family: Never    Attends Religious Services: Never    Production manager of [employer]  or Organizations: No    Attends Banker Meetings: Never    Marital Status: Divorced    Allergies  Allergen Reactions   Bee Venom Swelling and Anaphylaxis   Ibuprofen  Swelling   Latex Swelling   Omega-3 Hives and Swelling   Other Hives and Swelling    Potatoes      Penicillins Swelling    Outpatient Medications Prior to Visit  Medication Sig Dispense Refill   buPROPion  (WELLBUTRIN  XL) 300 MG 24 hr tablet Take 1 tablet (300 mg total) by mouth daily. 30 tablet 2   Collagen-Vitamin C-Biotin (COLLAGEN PO) Take 2 tablets by mouth daily.     dicyclomine  (BENTYL ) 10 MG capsule Take 1 tablet (10 mg total) by mouth 2-3 times daily as needed for abdominal cramps and diarrhea 180 capsule 1   hydrOXYzine  (ATARAX ) 10 MG tablet TAKE 0.5-1 TABLETS (5-10 MG TOTAL) BY MOUTH 2 (TWO) TIMES DAILY AS NEEDED FOR ANXIETY (SLEEP). (Patient taking differently: Take 20 mg by mouth at bedtime.) 180 tablet 1   lithium  carbonate 300 MG capsule Take 1 capsule (300 mg total) by mouth in the morning AND 2 capsules (600 mg total) at bedtime. 90 capsule 2   methocarbamol  (ROBAXIN ) 500 MG tablet Take 1 tablet (500 mg total) by mouth every 6 (six) hours as needed for muscle spasms. 20 tablet 0   Multiple Vitamins-Minerals (ONE-A-DAY WOMENS PO) Take 1 tablet by mouth daily.     mupirocin  ointment (BACTROBAN ) 2 % Apply 1 Application topically 2 (two) times daily. To left elbow (Patient not taking: Reported on 01/10/2024) 22 g 0   QUEtiapine  (SEROQUEL ) 200 MG tablet TAKE 1 TABLET BY MOUTH AT BEDTIME.  90 tablet 1   atorvastatin  (LIPITOR) 40 MG tablet TAKE 1 TABLET BY MOUTH EVERY DAY (Patient taking differently: Take 40 mg by mouth at bedtime.) 90 tablet 0   SYNTHROID  137 MCG tablet TAKE 1 TABLET BY MOUTH DAILY BEFORE BREAKFAST. 90 tablet 1   No facility-administered medications prior to visit.     ROS Review of Systems  Constitutional:  Negative for activity change and appetite change.  HENT:  Negative for sinus pressure and sore throat.   Respiratory:  Negative for chest tightness, shortness of breath and wheezing.   Cardiovascular:  Positive for leg swelling. Negative for chest pain and palpitations.  Gastrointestinal:  Positive for constipation. Negative for abdominal distention and abdominal pain.  Genitourinary: Negative.   Musculoskeletal: Negative.   Psychiatric/Behavioral:  Positive for dysphoric mood. Negative for behavioral problems.     Objective:  BP 135/77   Pulse 66   Ht 5' 3 (1.6 m)   Wt 203 lb (92.1 kg)   LMP 11/29/2012   SpO2 97%   BMI 35.96 kg/m      01/27/2024    3:31 PM 01/10/2024    1:18 PM 01/10/2024   11:00 AM  BP/Weight  Systolic BP 135 117 109  Diastolic BP 77 82 68  Wt. (Lbs) 203    BMI 35.96 kg/m2        Physical Exam Constitutional:      Appearance: She is well-developed.  Cardiovascular:     Rate and Rhythm: Normal rate.     Heart sounds: Normal heart sounds. No murmur heard. Pulmonary:     Effort: Pulmonary effort is normal.     Breath sounds: Normal breath sounds. No wheezing or rales.  Chest:     Chest wall: No tenderness.  Abdominal:     General: Bowel  sounds are normal. There is no distension.     Palpations: Abdomen is soft. There is no mass.     Tenderness: There is no abdominal tenderness.  Musculoskeletal:        General: Normal range of motion.     Right lower leg: No edema.     Left lower leg: No edema.  Neurological:     Mental Status: She is alert and oriented to person, place, and time.  Psychiatric:         Mood and Affect: Mood normal.        Latest Ref Rng & Units 01/10/2024   10:34 AM 10/25/2023    3:28 PM 03/02/2023    1:13 PM  CMP  Glucose 70 - 99 mg/dL 95  89  95   BUN 6 - 20 mg/dL 19  14  16    Creatinine 0.44 - 1.00 mg/dL 9.16  9.08  9.25   Sodium 135 - 145 mmol/L 139  141  137   Potassium 3.5 - 5.1 mmol/L 4.1  4.3  4.1   Chloride 98 - 111 mmol/L 107  105  106   CO2 22 - 32 mmol/L 25  23  23    Calcium  8.9 - 10.3 mg/dL 89.9  89.7  9.6   Total Protein 6.0 - 8.5 g/dL  6.6    Total Bilirubin 0.0 - 1.2 mg/dL  0.3    Alkaline Phos 44 - 121 IU/L  87    AST 0 - 40 IU/L  18    ALT 0 - 32 IU/L  21      Lipid Panel     Component Value Date/Time   CHOL 152 10/25/2023 1528   TRIG 254 (H) 10/25/2023 1528   HDL 40 10/25/2023 1528   CHOLHDL 3.8 10/25/2023 1528   CHOLHDL 5.1 04/18/2018 1056   VLDL 56 (H) 04/18/2018 1056   LDLCALC 71 10/25/2023 1528    CBC    Component Value Date/Time   WBC 6.0 01/10/2024 1034   RBC 4.63 01/10/2024 1034   HGB 14.6 01/10/2024 1034   HGB 14.4 10/25/2023 1528   HCT 45.2 01/10/2024 1034   HCT 42.8 10/25/2023 1528   PLT 173 01/10/2024 1034   PLT 194 10/25/2023 1528   MCV 97.6 01/10/2024 1034   MCV 98 (H) 10/25/2023 1528   MCH 31.5 01/10/2024 1034   MCHC 32.3 01/10/2024 1034   RDW 11.9 01/10/2024 1034   RDW 12.5 10/25/2023 1528   LYMPHSABS 1.6 10/25/2023 1528   MONOABS 0.3 10/03/2022 1409   EOSABS 0.2 10/25/2023 1528   BASOSABS 0.0 10/25/2023 1528    Lab Results  Component Value Date   HGBA1C 5.2 02/05/2023   Lab Results  Component Value Date   TSH 2.510 10/25/2023        Assessment & Plan Bilateral lower extremity edema Chronic lower extremity swelling, likely exacerbated by occupational standing. Cardiac involvement unlikely. Previous diuretic use effective. - Prescribe furosemide  as needed. - Recommend compression stockings, especially in colder weather.  Nonscarring hair loss Chronic hair loss with thinning and scalp  exposure. Possible stress, medication, and hormonal changes post-surgical menopause. Normal thyroid  function reduces likelihood of thyroid  dysfunction. - Refer to dermatology for further evaluation and management. - Recommend over-the-counter biotin supplements.  Constipation Chronic constipation with financial difficulty affording treatments. Miralax  ineffective. Amitiza  discussed as an alternative. - Prescribe Amitiza  8 mcg twice daily. 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 Previous on HRT for several years but then discontinued as she was concerned of cancer risks.  Symptoms returned, seeking management options. Hormone therapy risks discussed. - Refer to gynecology for evaluation and management.  Pure hypercholesterolemia Good total cholesterol, slightly elevated triglycerides. Managed with atorvastatin . - Continue atorvastatin  therapy. - Recommend adding fish oil capsules.  Hypothyroidism Thyroid  function normal in May. Managed with levothyroxine . - Refill levothyroxine  prescription.  Bipolar disorder with anxiety symptoms Significant stress and anxiety related to personal and family issues. Dissatisfaction with previous therapy. Encouraged to find a therapist. Regular exercise recommended. - Encourage finding a therapist. - Recommend regular exercise, such as walking.     Healthcare maintenance Pneumococcal vaccine administered  Meds ordered this encounter  Medications   furosemide  (LASIX ) 20 MG tablet    Sig: Take 1 tablet (20 mg total) by mouth daily.    Dispense:  30 tablet    Refill:  3   atorvastatin  (LIPITOR) 40 MG tablet    Sig: Take 1 tablet (40 mg total) by mouth at bedtime.    Dispense:  90 tablet    Refill:  1   levothyroxine  (SYNTHROID ) 137 MCG tablet    Sig: Take 1 tablet (137 mcg  total) by mouth daily before breakfast.    Dispense:  90 tablet    Refill:  1   lubiprostone  (AMITIZA ) 8 MCG capsule    Sig: Take 1 capsule (8 mcg total) by mouth 2 (two) times daily with a meal.    Dispense:  180 capsule    Refill:  1    Follow-up: Return in about 6 months (around 07/29/2024) for Chronic medical conditions.       Corrina Sabin, MD, FAAFP. [facility] [place], [place] [phone]   01/28/2024, 8:17 AM

## 2024-01-28 ENCOUNTER — Encounter: Payer: Self-pay | Admitting: Family Medicine

## 2024-02-11 ENCOUNTER — Other Ambulatory Visit (HOSPITAL_COMMUNITY): Payer: Self-pay | Admitting: Psychiatry

## 2024-02-11 DIAGNOSIS — F411 Generalized anxiety disorder: Secondary | ICD-10-CM

## 2024-04-06 ENCOUNTER — Other Ambulatory Visit: Payer: Self-pay | Admitting: Family Medicine

## 2024-04-06 ENCOUNTER — Other Ambulatory Visit (HOSPITAL_COMMUNITY): Payer: Self-pay | Admitting: Psychiatry

## 2024-04-06 ENCOUNTER — Telehealth (HOSPITAL_COMMUNITY): Payer: Self-pay

## 2024-04-06 DIAGNOSIS — F319 Bipolar disorder, unspecified: Secondary | ICD-10-CM

## 2024-04-06 NOTE — Telephone Encounter (Signed)
 Patient's pharmacy called in requesting a refill for Bupropion  300 mg. Patient has not been seen at office since 11/11/23. Will see new provider on 04/09/24. Staff was informed there is not guarantee that request will be filled until after appointment.   Will send over to provider for consideration

## 2024-04-07 ENCOUNTER — Other Ambulatory Visit: Payer: Self-pay | Admitting: Family Medicine

## 2024-04-07 ENCOUNTER — Other Ambulatory Visit (HOSPITAL_COMMUNITY): Payer: Self-pay | Admitting: Psychiatry

## 2024-04-07 ENCOUNTER — Encounter (HOSPITAL_COMMUNITY): Admitting: Psychiatry

## 2024-04-07 DIAGNOSIS — F319 Bipolar disorder, unspecified: Secondary | ICD-10-CM
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
## 2024-04-09 ENCOUNTER — Encounter (HOSPITAL_COMMUNITY): Admitting: Student in an Organized Health Care Education/Training Program

## 2024-04-10 MED ORDER — BUPROPION HCL ER (XL) 300 MG PO TB24: 300.0000 mg | ORAL_TABLET | Freq: Every day | ORAL | 0 refills | Status: DC

## 2024-04-10 NOTE — Telephone Encounter (Signed)
 Sent prescription of Wellbutrin  XL 300 mg daily sent for 60 days to bridge patient until follow up appointment. Patient has to be seen by me for any further refill requests.  Tawn Fitzner Carrin Carrero, MD PGY-3, [facility]

## 2024-04-12 ENCOUNTER — Other Ambulatory Visit (HOSPITAL_COMMUNITY): Payer: Self-pay | Admitting: Psychiatry

## 2024-04-12 DIAGNOSIS — F319 Bipolar disorder, unspecified: Secondary | ICD-10-CM

## 2024-04-13 ENCOUNTER — Other Ambulatory Visit (HOSPITAL_COMMUNITY): Payer: Self-pay | Admitting: Psychiatry

## 2024-04-13 DIAGNOSIS — F319 Bipolar disorder, unspecified: Secondary | ICD-10-CM

## 2024-04-13 MED ORDER — LITHIUM CARBONATE 300 MG PO CAPS: ORAL_CAPSULE | ORAL | 0 refills | Status: DC

## 2024-04-14 ENCOUNTER — Ambulatory Visit: Payer: Self-pay | Admitting: Nurse Practitioner

## 2024-04-16 ENCOUNTER — Telehealth (HOSPITAL_COMMUNITY): Payer: Self-pay

## 2024-04-16 ENCOUNTER — Other Ambulatory Visit (HOSPITAL_COMMUNITY): Payer: Self-pay | Admitting: Psychiatry

## 2024-04-16 DIAGNOSIS — F411 Generalized anxiety disorder: Secondary | ICD-10-CM

## 2024-04-16 MED ORDER — HYDROXYZINE HCL 10 MG PO TABS: 5.0000 mg | ORAL_TABLET | Freq: Two times a day (BID) | ORAL | 0 refills | Status: DC | PRN

## 2024-04-16 NOTE — Telephone Encounter (Signed)
 Refill was submitted for hydroxyzine  10 mg BID, 60 tablets, to bridge patient until follow up appointment. Patient will need to present for scheduled med management appointment for further refills.    Chin Wachter Carrin Carrero, MD PGY-3, [facility]

## 2024-04-16 NOTE — Telephone Encounter (Signed)
 Patient called in requesting a refill on Hydroxyzine  10 mg.   Patient has canceled or missed last two appointments. Patient will be seeing new provider on 05/12/24. Patient was informed the refill request is not guaranteed and it is the provider's discretion. Patient was informed she has the option to walk in. Patient expressed understanding.

## 2024-04-20 ENCOUNTER — Other Ambulatory Visit (HOSPITAL_COMMUNITY): Payer: Self-pay | Admitting: Psychiatry

## 2024-04-20 ENCOUNTER — Other Ambulatory Visit: Payer: Self-pay | Admitting: Family Medicine

## 2024-05-12 ENCOUNTER — Telehealth (HOSPITAL_COMMUNITY): Admitting: Student in an Organized Health Care Education/Training Program

## 2024-05-12 DIAGNOSIS — F319 Bipolar disorder, unspecified: Secondary | ICD-10-CM | POA: Diagnosis not present

## 2024-05-12 DIAGNOSIS — F411 Generalized anxiety disorder: Secondary | ICD-10-CM | POA: Diagnosis not present

## 2024-05-12 MED ORDER — HYDROXYZINE HCL 10 MG PO TABS: 5.0000 mg | ORAL_TABLET | Freq: Two times a day (BID) | ORAL | 0 refills | Status: DC | PRN

## 2024-05-12 MED ORDER — BUPROPION HCL ER (XL) 300 MG PO TB24: 300.0000 mg | ORAL_TABLET | Freq: Every day | ORAL | 0 refills | Status: DC

## 2024-05-12 MED ORDER — LITHIUM CARBONATE 300 MG PO CAPS: ORAL_CAPSULE | ORAL | 2 refills | Status: DC

## 2024-05-12 MED ORDER — QUETIAPINE FUMARATE 200 MG PO TABS: 200.0000 mg | ORAL_TABLET | Freq: Every day | ORAL | 0 refills | Status: DC

## 2024-05-12 NOTE — Patient Instructions (Signed)
 Outpatient Therapy and Psychiatry Resources for Patients: Your psychiatric needs would be well-served by consultation and regular meetings with an outpatient therapist to assist you with your mood-related conditions. Here are a series of links for finding a therapist.     Includes links to the following: [facility] (http://wilson-mayo.[email or web address]/) (only for [facility] and please reserve for uninsured) [facility] (http://blankenship-martinez.[email or web address]/) Psychology Today Special educational needs teacher (https://www.psychologytoday.com/us/therapists) Psychology Today Support Group Finder (https://www.psychologytoday.com/us/groups/) [facility] Location (https://carolinabehavioralcare.com/staff-location/[place]/) [facility] [place] - Support Group Finder - (RecordDebt.fi) [facility] [employer] - Lexicographer (https://fspcares.org/contact/) [employer] for Mental Health [place] - NAMI (https://namiguilford.org/support-and-education/support-groups/) Interior and spatial designer Health - Affiliated with [facility] (https://www.[place].com/lb/locations/profile/[facility]-[place]-behavioral-medicine-at-[address]/) Dept of Health and Human Services - Find a mental health facility (http://lester.info/)
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
## 2024-06-02 ENCOUNTER — Other Ambulatory Visit: Payer: Self-pay | Admitting: Family Medicine

## 2024-06-02 DIAGNOSIS — Z1231 Encounter for screening mammogram for malignant neoplasm of breast: Secondary | ICD-10-CM

## 2024-06-10 ENCOUNTER — Other Ambulatory Visit: Payer: Self-pay | Admitting: Family Medicine

## 2024-06-11 ENCOUNTER — Encounter: Payer: Self-pay | Admitting: Family Medicine

## 2024-06-11 ENCOUNTER — Encounter: Admitting: Family Medicine

## 2024-06-11 NOTE — Progress Notes (Signed)
 Patient did not keep appointment today. She may call to reschedule.
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
## 2024-06-18 ENCOUNTER — Other Ambulatory Visit (HOSPITAL_COMMUNITY): Payer: Self-pay | Admitting: Student in an Organized Health Care Education/Training Program

## 2024-06-18 DIAGNOSIS — F411 Generalized anxiety disorder: Secondary | ICD-10-CM

## 2024-06-25 ENCOUNTER — Inpatient Hospital Stay: Admission: RE | Admit: 2024-06-25 | Source: Ambulatory Visit

## 2024-06-30 ENCOUNTER — Inpatient Hospital Stay: Admission: RE | Admit: 2024-06-30
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
## 2024-07-03 ENCOUNTER — Telehealth (HOSPITAL_COMMUNITY): Payer: Self-pay | Admitting: Student in an Organized Health Care Education/Training Program

## 2024-07-03 ENCOUNTER — Encounter (HOSPITAL_COMMUNITY): Payer: Self-pay

## 2024-07-03 ENCOUNTER — Encounter (HOSPITAL_COMMUNITY): Admitting: Student in an Organized Health Care Education/Training Program

## 2024-07-03 DIAGNOSIS — F319 Bipolar disorder, unspecified: Secondary | ICD-10-CM

## 2024-07-03 DIAGNOSIS — F411 Generalized anxiety disorder: Secondary | ICD-10-CM

## 2024-07-03 MED ORDER — HYDROXYZINE HCL 10 MG PO TABS: 10.0000 mg | ORAL_TABLET | Freq: Two times a day (BID) | ORAL | 2 refills | Status: AC | PRN

## 2024-07-03 MED ORDER — BUPROPION HCL ER (XL) 300 MG PO TB24: 300.0000 mg | ORAL_TABLET | Freq: Every day | ORAL | 0 refills | Status: AC

## 2024-07-03 MED ORDER — LITHIUM CARBONATE 300 MG PO CAPS: ORAL_CAPSULE | ORAL | 2 refills | Status: AC

## 2024-07-03 MED ORDER — QUETIAPINE FUMARATE 200 MG PO TABS: 200.0000 mg | ORAL_TABLET | Freq: Every day | ORAL | 0 refills | Status: AC
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
## 2024-07-13 ENCOUNTER — Ambulatory Visit

## 2024-07-29 ENCOUNTER — Ambulatory Visit: Admitting: Family Medicine

## 2024-07-30 ENCOUNTER — Encounter (HOSPITAL_COMMUNITY): Admitting: Student in an Organized Health Care Education/Training Program

## 2024-08-06 ENCOUNTER — Ambulatory Visit: Payer: Self-pay | Admitting: Family Medicine
